# Patient Record
Sex: Male | Born: 1935
Health system: Southern US, Community
[De-identification: ages and names within clinical notes are randomized; demographics above are authoritative.]

## PROBLEM LIST (undated history)

## (undated) DIAGNOSIS — I491 Atrial premature depolarization: Secondary | ICD-10-CM

## (undated) DIAGNOSIS — H348322 Tributary (branch) retinal vein occlusion, left eye, stable: Secondary | ICD-10-CM

## (undated) DIAGNOSIS — Z974 Presence of external hearing-aid: Secondary | ICD-10-CM

## (undated) DIAGNOSIS — G4733 Obstructive sleep apnea (adult) (pediatric): Secondary | ICD-10-CM

## (undated) DIAGNOSIS — K219 Gastro-esophageal reflux disease without esophagitis: Secondary | ICD-10-CM

## (undated) DIAGNOSIS — T7840XA Allergy, unspecified, initial encounter: Secondary | ICD-10-CM

## (undated) DIAGNOSIS — E538 Deficiency of other specified B group vitamins: Secondary | ICD-10-CM

## (undated) DIAGNOSIS — K5909 Other constipation: Secondary | ICD-10-CM

## (undated) DIAGNOSIS — Z8719 Personal history of other diseases of the digestive system: Secondary | ICD-10-CM

## (undated) DIAGNOSIS — I6523 Occlusion and stenosis of bilateral carotid arteries: Secondary | ICD-10-CM

## (undated) DIAGNOSIS — I779 Disorder of arteries and arterioles, unspecified: Secondary | ICD-10-CM

## (undated) DIAGNOSIS — D126 Benign neoplasm of colon, unspecified: Secondary | ICD-10-CM

## (undated) DIAGNOSIS — R972 Elevated prostate specific antigen [PSA]: Secondary | ICD-10-CM

## (undated) DIAGNOSIS — R109 Unspecified abdominal pain: Secondary | ICD-10-CM

## (undated) DIAGNOSIS — I700 Atherosclerosis of aorta: Secondary | ICD-10-CM

## (undated) DIAGNOSIS — K2970 Gastritis, unspecified, without bleeding: Secondary | ICD-10-CM

## (undated) DIAGNOSIS — M1990 Unspecified osteoarthritis, unspecified site: Secondary | ICD-10-CM

## (undated) DIAGNOSIS — K222 Esophageal obstruction: Secondary | ICD-10-CM

## (undated) DIAGNOSIS — N400 Enlarged prostate without lower urinary tract symptoms: Secondary | ICD-10-CM

## (undated) DIAGNOSIS — I10 Essential (primary) hypertension: Secondary | ICD-10-CM

## (undated) DIAGNOSIS — F32A Depression, unspecified: Secondary | ICD-10-CM

## (undated) DIAGNOSIS — I498 Other specified cardiac arrhythmias: Secondary | ICD-10-CM

## (undated) DIAGNOSIS — R001 Bradycardia, unspecified: Secondary | ICD-10-CM

## (undated) DIAGNOSIS — K5790 Diverticulosis of intestine, part unspecified, without perforation or abscess without bleeding: Secondary | ICD-10-CM

## (undated) DIAGNOSIS — I739 Peripheral vascular disease, unspecified: Secondary | ICD-10-CM

## (undated) DIAGNOSIS — R002 Palpitations: Secondary | ICD-10-CM

## (undated) DIAGNOSIS — N200 Calculus of kidney: Secondary | ICD-10-CM

## (undated) DIAGNOSIS — K317 Polyp of stomach and duodenum: Secondary | ICD-10-CM

## (undated) DIAGNOSIS — K5730 Diverticulosis of large intestine without perforation or abscess without bleeding: Secondary | ICD-10-CM

## (undated) DIAGNOSIS — D693 Immune thrombocytopenic purpura: Secondary | ICD-10-CM

## (undated) DIAGNOSIS — R351 Nocturia: Secondary | ICD-10-CM

## (undated) DIAGNOSIS — K648 Other hemorrhoids: Secondary | ICD-10-CM

## (undated) DIAGNOSIS — I7300 Raynaud's syndrome without gangrene: Secondary | ICD-10-CM

## (undated) DIAGNOSIS — K449 Diaphragmatic hernia without obstruction or gangrene: Secondary | ICD-10-CM

## (undated) DIAGNOSIS — F329 Major depressive disorder, single episode, unspecified: Secondary | ICD-10-CM

## (undated) DIAGNOSIS — I493 Ventricular premature depolarization: Secondary | ICD-10-CM

## (undated) DIAGNOSIS — Z86010 Personal history of colonic polyps: Secondary | ICD-10-CM

## (undated) DIAGNOSIS — N401 Enlarged prostate with lower urinary tract symptoms: Secondary | ICD-10-CM

## (undated) DIAGNOSIS — F419 Anxiety disorder, unspecified: Secondary | ICD-10-CM

## (undated) DIAGNOSIS — E785 Hyperlipidemia, unspecified: Secondary | ICD-10-CM

## (undated) HISTORY — DX: Hyperlipidemia, unspecified: E78.5

## (undated) HISTORY — DX: Gastritis, unspecified, without bleeding: K29.70

## (undated) HISTORY — DX: Unspecified osteoarthritis, unspecified site: M19.90

## (undated) HISTORY — DX: Raynaud's syndrome without gangrene: I73.00

## (undated) HISTORY — DX: Essential (primary) hypertension: I10

## (undated) HISTORY — DX: Anxiety disorder, unspecified: F41.9

## (undated) HISTORY — DX: Immune thrombocytopenic purpura: D69.3

## (undated) HISTORY — DX: Esophageal obstruction: K22.2

## (undated) HISTORY — DX: Atherosclerosis of aorta: I70.0

## (undated) HISTORY — DX: Other hemorrhoids: K64.8

## (undated) HISTORY — DX: Atrial premature depolarization: I49.1

## (undated) HISTORY — PX: COLONOSCOPY: SHX174

## (undated) HISTORY — DX: Bradycardia, unspecified: R00.1

## (undated) HISTORY — PX: APPENDECTOMY: SHX54

## (undated) HISTORY — DX: Polyp of stomach and duodenum: K31.7

## (undated) HISTORY — DX: Diverticulosis of intestine, part unspecified, without perforation or abscess without bleeding: K57.90

## (undated) HISTORY — DX: Peripheral vascular disease, unspecified: I73.9

## (undated) HISTORY — DX: Unspecified abdominal pain: R10.9

## (undated) HISTORY — DX: Other specified cardiac arrhythmias: I49.8

## (undated) HISTORY — DX: Benign neoplasm of colon, unspecified: D12.6

## (undated) HISTORY — DX: Ventricular premature depolarization: I49.3

## (undated) HISTORY — DX: Calculus of kidney: N20.0

## (undated) HISTORY — DX: Palpitations: R00.2

## (undated) HISTORY — DX: Gastro-esophageal reflux disease without esophagitis: K21.9

## (undated) HISTORY — DX: Elevated prostate specific antigen (PSA): R97.20

## (undated) HISTORY — DX: Major depressive disorder, single episode, unspecified: F32.9

## (undated) HISTORY — DX: Allergy, unspecified, initial encounter: T78.40XA

## (undated) HISTORY — PX: TONSILLECTOMY: SUR1361

## (undated) HISTORY — DX: Personal history of colonic polyps: Z86.010

## (undated) HISTORY — DX: Personal history of other diseases of the digestive system: Z87.19

## (undated) HISTORY — DX: Depression, unspecified: F32.A

## (undated) HISTORY — DX: Disorder of arteries and arterioles, unspecified: I77.9

---
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
## 1985-05-20 HISTORY — PX: ROTATOR CUFF REPAIR: SHX139

## 1987-05-21 HISTORY — PX: CHOLECYSTECTOMY: SHX55

## 1987-05-21 HISTORY — PX: CARDIAC CATHETERIZATION: SHX172

## 1989-03-27 ENCOUNTER — Encounter (INDEPENDENT_AMBULATORY_CARE_PROVIDER_SITE_OTHER): Payer: Self-pay | Admitting: *Deleted

## 1992-04-25 ENCOUNTER — Encounter (INDEPENDENT_AMBULATORY_CARE_PROVIDER_SITE_OTHER): Payer: Self-pay | Admitting: *Deleted

## 1995-06-03 ENCOUNTER — Encounter (INDEPENDENT_AMBULATORY_CARE_PROVIDER_SITE_OTHER): Payer: Self-pay | Admitting: *Deleted

## 1997-03-02 ENCOUNTER — Encounter (INDEPENDENT_AMBULATORY_CARE_PROVIDER_SITE_OTHER): Payer: Self-pay | Admitting: *Deleted

## 1998-03-31 ENCOUNTER — Encounter: Admission: RE | Admit: 1998-03-31 | Discharge: 1998-03-31 | Payer: Self-pay | Admitting: Family Medicine

## 1998-04-19 ENCOUNTER — Encounter: Admission: RE | Admit: 1998-04-19 | Discharge: 1998-04-19 | Payer: Self-pay | Admitting: Family Medicine

## 1998-11-09 ENCOUNTER — Encounter (INDEPENDENT_AMBULATORY_CARE_PROVIDER_SITE_OTHER): Payer: Self-pay | Admitting: *Deleted

## 1998-11-09 ENCOUNTER — Encounter (INDEPENDENT_AMBULATORY_CARE_PROVIDER_SITE_OTHER): Payer: Self-pay | Admitting: Specialist

## 1998-11-09 ENCOUNTER — Other Ambulatory Visit: Admission: RE | Admit: 1998-11-09 | Discharge: 1998-11-09 | Payer: Self-pay | Admitting: Gastroenterology

## 1999-07-26 ENCOUNTER — Other Ambulatory Visit: Admission: RE | Admit: 1999-07-26 | Discharge: 1999-07-26 | Payer: Self-pay | Admitting: Urology

## 1999-08-22 ENCOUNTER — Ambulatory Visit (HOSPITAL_COMMUNITY): Admission: RE | Admit: 1999-08-22 | Discharge: 1999-08-22 | Payer: Self-pay | Admitting: Ophthalmology

## 2002-06-03 ENCOUNTER — Ambulatory Visit (HOSPITAL_COMMUNITY): Admission: RE | Admit: 2002-06-03 | Discharge: 2002-06-03 | Payer: Self-pay | Admitting: Internal Medicine

## 2003-08-17 ENCOUNTER — Encounter (INDEPENDENT_AMBULATORY_CARE_PROVIDER_SITE_OTHER): Payer: Self-pay | Admitting: *Deleted

## 2003-09-06 ENCOUNTER — Encounter (INDEPENDENT_AMBULATORY_CARE_PROVIDER_SITE_OTHER): Payer: Self-pay | Admitting: *Deleted

## 2004-03-29 ENCOUNTER — Ambulatory Visit: Payer: Self-pay | Admitting: Internal Medicine

## 2004-04-26 ENCOUNTER — Ambulatory Visit: Payer: Self-pay | Admitting: Internal Medicine

## 2004-05-20 HISTORY — PX: OTHER SURGICAL HISTORY: SHX169

## 2004-05-25 ENCOUNTER — Ambulatory Visit: Payer: Self-pay | Admitting: Internal Medicine

## 2004-05-31 ENCOUNTER — Ambulatory Visit: Payer: Self-pay | Admitting: Internal Medicine

## 2004-06-06 ENCOUNTER — Ambulatory Visit: Payer: Self-pay | Admitting: Internal Medicine

## 2004-06-07 ENCOUNTER — Ambulatory Visit: Payer: Self-pay

## 2004-06-22 ENCOUNTER — Ambulatory Visit: Payer: Self-pay | Admitting: Internal Medicine

## 2004-07-26 ENCOUNTER — Ambulatory Visit: Payer: Self-pay | Admitting: Internal Medicine

## 2004-08-24 ENCOUNTER — Ambulatory Visit: Payer: Self-pay | Admitting: Internal Medicine

## 2004-10-02 ENCOUNTER — Ambulatory Visit: Payer: Self-pay | Admitting: Internal Medicine

## 2004-10-26 ENCOUNTER — Ambulatory Visit: Payer: Self-pay | Admitting: Internal Medicine

## 2004-11-01 ENCOUNTER — Ambulatory Visit: Payer: Self-pay | Admitting: Internal Medicine

## 2004-11-26 ENCOUNTER — Ambulatory Visit: Payer: Self-pay | Admitting: Internal Medicine

## 2004-12-03 ENCOUNTER — Encounter (INDEPENDENT_AMBULATORY_CARE_PROVIDER_SITE_OTHER): Payer: Self-pay | Admitting: *Deleted

## 2004-12-03 ENCOUNTER — Ambulatory Visit: Payer: Self-pay | Admitting: Gastroenterology

## 2004-12-05 ENCOUNTER — Ambulatory Visit: Payer: Self-pay | Admitting: Internal Medicine

## 2004-12-11 ENCOUNTER — Ambulatory Visit: Payer: Self-pay | Admitting: Gastroenterology

## 2004-12-11 ENCOUNTER — Encounter (INDEPENDENT_AMBULATORY_CARE_PROVIDER_SITE_OTHER): Payer: Self-pay | Admitting: *Deleted

## 2004-12-25 ENCOUNTER — Ambulatory Visit (HOSPITAL_BASED_OUTPATIENT_CLINIC_OR_DEPARTMENT_OTHER): Admission: RE | Admit: 2004-12-25 | Discharge: 2004-12-25 | Payer: Self-pay | Admitting: Internal Medicine

## 2004-12-25 ENCOUNTER — Ambulatory Visit: Payer: Self-pay | Admitting: Internal Medicine

## 2005-01-02 ENCOUNTER — Ambulatory Visit: Payer: Self-pay | Admitting: Pulmonary Disease

## 2005-01-17 ENCOUNTER — Ambulatory Visit: Payer: Self-pay | Admitting: Internal Medicine

## 2005-01-18 ENCOUNTER — Ambulatory Visit: Payer: Self-pay | Admitting: Gastroenterology

## 2005-01-22 ENCOUNTER — Encounter (INDEPENDENT_AMBULATORY_CARE_PROVIDER_SITE_OTHER): Payer: Self-pay | Admitting: *Deleted

## 2005-01-23 ENCOUNTER — Encounter (INDEPENDENT_AMBULATORY_CARE_PROVIDER_SITE_OTHER): Payer: Self-pay | Admitting: *Deleted

## 2005-01-23 ENCOUNTER — Ambulatory Visit: Payer: Self-pay | Admitting: Gastroenterology

## 2005-01-24 ENCOUNTER — Ambulatory Visit: Payer: Self-pay | Admitting: Internal Medicine

## 2005-01-28 ENCOUNTER — Encounter (INDEPENDENT_AMBULATORY_CARE_PROVIDER_SITE_OTHER): Payer: Self-pay | Admitting: *Deleted

## 2005-01-28 ENCOUNTER — Ambulatory Visit: Payer: Self-pay | Admitting: Cardiology

## 2005-03-06 ENCOUNTER — Ambulatory Visit: Payer: Self-pay | Admitting: Internal Medicine

## 2005-03-06 ENCOUNTER — Encounter (INDEPENDENT_AMBULATORY_CARE_PROVIDER_SITE_OTHER): Payer: Self-pay | Admitting: *Deleted

## 2005-03-06 ENCOUNTER — Ambulatory Visit: Payer: Self-pay | Admitting: Gastroenterology

## 2005-05-27 ENCOUNTER — Ambulatory Visit: Payer: Self-pay | Admitting: Internal Medicine

## 2005-05-29 ENCOUNTER — Ambulatory Visit: Payer: Self-pay | Admitting: Internal Medicine

## 2005-06-07 ENCOUNTER — Ambulatory Visit: Payer: Self-pay

## 2005-07-22 ENCOUNTER — Ambulatory Visit: Payer: Self-pay | Admitting: Internal Medicine

## 2005-07-29 ENCOUNTER — Ambulatory Visit: Payer: Self-pay | Admitting: Internal Medicine

## 2005-10-21 ENCOUNTER — Ambulatory Visit: Payer: Self-pay | Admitting: Internal Medicine

## 2005-10-28 ENCOUNTER — Ambulatory Visit: Payer: Self-pay | Admitting: Internal Medicine

## 2005-11-07 ENCOUNTER — Ambulatory Visit: Payer: Self-pay | Admitting: Internal Medicine

## 2005-12-26 ENCOUNTER — Ambulatory Visit: Payer: Self-pay | Admitting: Internal Medicine

## 2006-01-27 ENCOUNTER — Ambulatory Visit: Payer: Self-pay | Admitting: Internal Medicine

## 2006-02-20 ENCOUNTER — Ambulatory Visit: Payer: Self-pay | Admitting: Internal Medicine

## 2006-04-21 ENCOUNTER — Ambulatory Visit: Payer: Self-pay | Admitting: Internal Medicine

## 2006-04-21 LAB — CONVERTED CEMR LAB
Albumin: 4.2 g/dL (ref 3.5–5.2)
Chol/HDL Ratio, serum: 3.8
Cholesterol: 165 mg/dL (ref 0–200)
Folate: >20 ng/mL
LDL Cholesterol: 89 mg/dL (ref 0–99)
Total Bilirubin: 0.9 mg/dL (ref 0.3–1.2)
Total Protein: 7 g/dL (ref 6.0–8.3)
Vitamin B-12: 353 pg/mL (ref 211–911)

## 2006-04-28 ENCOUNTER — Ambulatory Visit: Payer: Self-pay | Admitting: Internal Medicine

## 2006-05-22 ENCOUNTER — Ambulatory Visit: Payer: Self-pay

## 2006-06-02 ENCOUNTER — Ambulatory Visit: Payer: Self-pay | Admitting: Internal Medicine

## 2006-06-26 ENCOUNTER — Ambulatory Visit: Payer: Self-pay | Admitting: Internal Medicine

## 2006-07-24 ENCOUNTER — Ambulatory Visit: Payer: Self-pay | Admitting: Internal Medicine

## 2006-08-25 ENCOUNTER — Ambulatory Visit: Payer: Self-pay | Admitting: Internal Medicine

## 2006-08-25 LAB — CONVERTED CEMR LAB
Basophils Relative: 0.9 % (ref 0.0–1.0)
Folate: >20 ng/mL
Lymphocytes Relative: 36.5 % (ref 12.0–46.0)
Monocytes Relative: 9.1 % (ref 3.0–11.0)
Neutro Abs: 2.4 10*3/uL (ref 1.4–7.7)
Platelets: 157 10*3/uL (ref 150–400)
RDW: 12.8 % (ref 11.5–14.6)
Vitamin B-12: 364 pg/mL (ref 211–911)

## 2006-09-01 ENCOUNTER — Ambulatory Visit: Payer: Self-pay | Admitting: Internal Medicine

## 2006-10-01 ENCOUNTER — Ambulatory Visit: Payer: Self-pay | Admitting: Internal Medicine

## 2006-10-22 ENCOUNTER — Ambulatory Visit: Payer: Self-pay | Admitting: Gastroenterology

## 2006-11-03 ENCOUNTER — Ambulatory Visit: Payer: Self-pay | Admitting: Internal Medicine

## 2006-11-03 LAB — CONVERTED CEMR LAB
BUN: 12 mg/dL (ref 6–23)
Basophils Relative: 0.3 % (ref 0.0–1.0)
Bilirubin, Direct: 0.1 mg/dL (ref 0.0–0.3)
CO2: 31 meq/L (ref 19–32)
Eosinophils Relative: 3 % (ref 0.0–5.0)
GFR calc Af Amer: 94 mL/min
Glucose, Bld: 95 mg/dL (ref 70–99)
HDL: 41.8 mg/dL (ref >39.0)
Hemoglobin: 14.1 g/dL (ref 13.0–17.0)
Lymphocytes Relative: 33.6 % (ref 12.0–46.0)
Monocytes Absolute: 0.6 10*3/uL (ref 0.2–0.7)
Monocytes Relative: 10 % (ref 3.0–11.0)
Neutro Abs: 3.2 10*3/uL (ref 1.4–7.7)
Neutrophils Relative %: 53.1 % (ref 43.0–77.0)
PSA: 1.08 ng/mL (ref 0.10–4.00)
Potassium: 3.7 meq/L (ref 3.5–5.1)
Sodium: 141 meq/L (ref 135–145)
TSH: 2.66 microintl units/mL (ref 0.35–5.50)
Total Protein: 6.5 g/dL (ref 6.0–8.3)
VLDL: 23 mg/dL (ref 0–40)

## 2006-11-10 ENCOUNTER — Ambulatory Visit: Payer: Self-pay | Admitting: Internal Medicine

## 2007-01-02 ENCOUNTER — Telehealth: Payer: Self-pay | Admitting: *Deleted

## 2007-01-12 ENCOUNTER — Ambulatory Visit: Payer: Self-pay | Admitting: Internal Medicine

## 2007-01-22 DIAGNOSIS — E785 Hyperlipidemia, unspecified: Secondary | ICD-10-CM

## 2007-01-22 DIAGNOSIS — K219 Gastro-esophageal reflux disease without esophagitis: Secondary | ICD-10-CM | POA: Insufficient documentation

## 2007-01-26 ENCOUNTER — Ambulatory Visit: Payer: Self-pay | Admitting: Internal Medicine

## 2007-01-26 DIAGNOSIS — E538 Deficiency of other specified B group vitamins: Secondary | ICD-10-CM

## 2007-01-27 ENCOUNTER — Ambulatory Visit: Payer: Self-pay | Admitting: Internal Medicine

## 2007-01-27 ENCOUNTER — Telehealth: Payer: Self-pay | Admitting: Internal Medicine

## 2007-02-02 ENCOUNTER — Telehealth: Payer: Self-pay | Admitting: Internal Medicine

## 2007-04-20 ENCOUNTER — Ambulatory Visit: Payer: Self-pay | Admitting: Internal Medicine

## 2007-04-20 LAB — CONVERTED CEMR LAB
ALT: 18 units/L (ref 0–53)
Alkaline Phosphatase: 41 units/L (ref 39–117)
Bilirubin, Direct: 0.1 mg/dL (ref 0.0–0.3)
Direct LDL: 105.8 mg/dL
Total CHOL/HDL Ratio: 4.5
Triglycerides: 236 mg/dL (ref 0–149)
VLDL: 47 mg/dL — ABNORMAL HIGH (ref 0–40)

## 2007-04-29 ENCOUNTER — Ambulatory Visit: Payer: Self-pay | Admitting: Internal Medicine

## 2007-04-29 DIAGNOSIS — R071 Chest pain on breathing: Secondary | ICD-10-CM

## 2007-04-29 DIAGNOSIS — I10 Essential (primary) hypertension: Secondary | ICD-10-CM

## 2007-04-29 DIAGNOSIS — K4090 Unilateral inguinal hernia, without obstruction or gangrene, not specified as recurrent: Secondary | ICD-10-CM | POA: Insufficient documentation

## 2007-05-19 ENCOUNTER — Encounter: Payer: Self-pay | Admitting: Internal Medicine

## 2007-05-26 ENCOUNTER — Encounter: Payer: Self-pay | Admitting: Internal Medicine

## 2007-05-26 ENCOUNTER — Ambulatory Visit: Payer: Self-pay

## 2007-05-26 HISTORY — PX: OTHER SURGICAL HISTORY: SHX169

## 2007-06-22 ENCOUNTER — Encounter: Payer: Self-pay | Admitting: Endocrinology

## 2007-06-22 ENCOUNTER — Ambulatory Visit: Payer: Self-pay | Admitting: Internal Medicine

## 2007-08-05 ENCOUNTER — Ambulatory Visit: Payer: Self-pay | Admitting: Internal Medicine

## 2007-08-05 LAB — CONVERTED CEMR LAB: Vitamin B-12: 654 pg/mL (ref 211–911)

## 2007-08-14 ENCOUNTER — Ambulatory Visit: Payer: Self-pay | Admitting: Internal Medicine

## 2007-08-14 DIAGNOSIS — K648 Other hemorrhoids: Secondary | ICD-10-CM | POA: Insufficient documentation

## 2007-08-14 HISTORY — DX: Other hemorrhoids: K64.8

## 2007-09-29 ENCOUNTER — Encounter: Payer: Self-pay | Admitting: Internal Medicine

## 2007-10-30 ENCOUNTER — Telehealth: Payer: Self-pay | Admitting: Internal Medicine

## 2007-10-30 ENCOUNTER — Encounter: Payer: Self-pay | Admitting: Internal Medicine

## 2007-10-30 DIAGNOSIS — H547 Unspecified visual loss: Secondary | ICD-10-CM

## 2007-11-02 ENCOUNTER — Encounter: Payer: Self-pay | Admitting: Internal Medicine

## 2007-11-02 ENCOUNTER — Ambulatory Visit: Payer: Self-pay

## 2007-11-05 ENCOUNTER — Ambulatory Visit: Payer: Self-pay | Admitting: Internal Medicine

## 2007-11-05 DIAGNOSIS — I6529 Occlusion and stenosis of unspecified carotid artery: Secondary | ICD-10-CM | POA: Insufficient documentation

## 2007-11-06 ENCOUNTER — Ambulatory Visit: Payer: Self-pay | Admitting: Internal Medicine

## 2007-11-06 LAB — CONVERTED CEMR LAB
Albumin: 4 g/dL (ref 3.5–5.2)
Alkaline Phosphatase: 49 units/L (ref 39–117)
BUN: 13 mg/dL (ref 6–23)
Basophils Relative: 0.7 % (ref 0.0–1.0)
Blood in Urine, dipstick: NEGATIVE
Calcium: 9.7 mg/dL (ref 8.4–10.5)
Creatinine, Ser: 1.1 mg/dL (ref 0.4–1.5)
Eosinophils Absolute: 0.2 10*3/uL (ref 0.0–0.7)
Eosinophils Relative: 3.7 % (ref 0.0–5.0)
GFR calc Af Amer: 84 mL/min
GFR calc non Af Amer: 70 mL/min
Glucose, Bld: 95 mg/dL (ref 70–99)
Glucose, Urine, Semiquant: NEGATIVE
HCT: 40.2 % (ref 39.0–52.0)
HDL: 43.8 mg/dL (ref >39.0)
Hemoglobin: 13.9 g/dL (ref 13.0–17.0)
MCV: 89.5 fL (ref 78.0–100.0)
Monocytes Absolute: 0.5 10*3/uL (ref 0.1–1.0)
Monocytes Relative: 8.7 % (ref 3.0–12.0)
Neutro Abs: 2.9 10*3/uL (ref 1.4–7.7)
PSA: 1.26 ng/mL (ref 0.10–4.00)
Platelets: 131 10*3/uL — ABNORMAL LOW (ref 150–400)
Potassium: 4.6 meq/L (ref 3.5–5.1)
Specific Gravity, Urine: 1.02
TSH: 1.4 microintl units/mL (ref 0.35–5.50)
Total CHOL/HDL Ratio: 3.7
Total Protein: 6.7 g/dL (ref 6.0–8.3)
Urobilinogen, UA: 0.2
Vitamin B-12: 521 pg/mL (ref 211–911)
WBC Urine, dipstick: NEGATIVE
WBC Urine, dipstick: NEGATIVE
WBC: 5.2 10*3/uL (ref 4.5–10.5)
pH: 6

## 2007-11-09 ENCOUNTER — Telehealth: Payer: Self-pay | Admitting: *Deleted

## 2007-11-13 ENCOUNTER — Ambulatory Visit: Payer: Self-pay | Admitting: Internal Medicine

## 2007-11-25 ENCOUNTER — Ambulatory Visit: Payer: Self-pay | Admitting: Internal Medicine

## 2007-11-25 LAB — CONVERTED CEMR LAB
Eosinophils Absolute: 0.2 10*3/uL (ref 0.0–0.7)
Eosinophils Relative: 3 % (ref 0.0–5.0)
HCT: 40.9 % (ref 39.0–52.0)
Monocytes Absolute: 0.5 10*3/uL (ref 0.1–1.0)
Monocytes Relative: 9.2 % (ref 3.0–12.0)
Neutrophils Relative %: 58.2 % (ref 43.0–77.0)
Platelets: 121 10*3/uL — ABNORMAL LOW (ref 150–400)
RDW: 12.2 % (ref 11.5–14.6)
WBC: 5.2 10*3/uL (ref 4.5–10.5)

## 2007-11-26 ENCOUNTER — Ambulatory Visit: Payer: Self-pay | Admitting: *Deleted

## 2007-11-27 ENCOUNTER — Telehealth: Payer: Self-pay | Admitting: Internal Medicine

## 2007-12-01 ENCOUNTER — Encounter: Admission: RE | Admit: 2007-12-01 | Discharge: 2007-12-01 | Payer: Self-pay | Admitting: *Deleted

## 2007-12-10 ENCOUNTER — Ambulatory Visit: Payer: Self-pay | Admitting: *Deleted

## 2007-12-11 ENCOUNTER — Telehealth: Payer: Self-pay | Admitting: Internal Medicine

## 2007-12-18 ENCOUNTER — Encounter: Payer: Self-pay | Admitting: Internal Medicine

## 2007-12-23 ENCOUNTER — Telehealth: Payer: Self-pay | Admitting: *Deleted

## 2007-12-24 DIAGNOSIS — H3400 Transient retinal artery occlusion, unspecified eye: Secondary | ICD-10-CM

## 2007-12-28 ENCOUNTER — Telehealth: Payer: Self-pay | Admitting: Internal Medicine

## 2007-12-30 ENCOUNTER — Telehealth: Payer: Self-pay | Admitting: Internal Medicine

## 2007-12-31 ENCOUNTER — Ambulatory Visit: Payer: Self-pay | Admitting: Internal Medicine

## 2008-01-08 LAB — CONVERTED CEMR LAB
Basophils Absolute: 0 10*3/uL (ref 0.0–0.1)
Bilirubin, Direct: 0.1 mg/dL (ref 0.0–0.3)
Cholesterol: 152 mg/dL (ref 0–200)
HCT: 41.7 % (ref 39.0–52.0)
HDL: 44.7 mg/dL (ref >39.0)
Hemoglobin: 14.6 g/dL (ref 13.0–17.0)
LDL Cholesterol: 80 mg/dL (ref 0–99)
MCHC: 35 g/dL (ref 30.0–36.0)
Monocytes Absolute: 0.5 10*3/uL (ref 0.1–1.0)
Neutro Abs: 2.5 10*3/uL (ref 1.4–7.7)
Platelets: 123 10*3/uL — ABNORMAL LOW (ref 150–400)
RDW: 12.5 % (ref 11.5–14.6)
Total Bilirubin: 0.9 mg/dL (ref 0.3–1.2)
Triglycerides: 138 mg/dL (ref 0–149)

## 2008-01-11 ENCOUNTER — Encounter: Payer: Self-pay | Admitting: Internal Medicine

## 2008-01-12 ENCOUNTER — Telehealth: Payer: Self-pay | Admitting: Internal Medicine

## 2008-01-19 ENCOUNTER — Ambulatory Visit: Payer: Self-pay | Admitting: Internal Medicine

## 2008-02-16 ENCOUNTER — Emergency Department (HOSPITAL_COMMUNITY): Admission: EM | Admit: 2008-02-16 | Discharge: 2008-02-16 | Payer: Self-pay | Admitting: Emergency Medicine

## 2008-02-23 ENCOUNTER — Telehealth: Payer: Self-pay | Admitting: Internal Medicine

## 2008-03-04 DIAGNOSIS — K222 Esophageal obstruction: Secondary | ICD-10-CM | POA: Insufficient documentation

## 2008-03-04 DIAGNOSIS — Z860100 Personal history of colon polyps, unspecified: Secondary | ICD-10-CM | POA: Insufficient documentation

## 2008-03-04 DIAGNOSIS — F341 Dysthymic disorder: Secondary | ICD-10-CM

## 2008-03-07 ENCOUNTER — Ambulatory Visit: Payer: Self-pay | Admitting: Internal Medicine

## 2008-03-07 DIAGNOSIS — K5900 Constipation, unspecified: Secondary | ICD-10-CM | POA: Insufficient documentation

## 2008-04-11 ENCOUNTER — Ambulatory Visit: Payer: Self-pay | Admitting: Internal Medicine

## 2008-04-11 LAB — CONVERTED CEMR LAB
Basophils Relative: 0.8 % (ref 0.0–3.0)
Eosinophils Absolute: 0.2 10*3/uL (ref 0.0–0.7)
Eosinophils Relative: 4.1 % (ref 0.0–5.0)
HCT: 43.3 % (ref 39.0–52.0)
Hemoglobin: 15.1 g/dL (ref 13.0–17.0)
MCHC: 34.8 g/dL (ref 30.0–36.0)
MCV: 90.6 fL (ref 78.0–100.0)
Monocytes Absolute: 0.4 10*3/uL (ref 0.1–1.0)
Neutro Abs: 3.2 10*3/uL (ref 1.4–7.7)
RBC: 4.78 M/uL (ref 4.22–5.81)
WBC: 5.3 10*3/uL (ref 4.5–10.5)

## 2008-04-21 ENCOUNTER — Ambulatory Visit: Payer: Self-pay | Admitting: Internal Medicine

## 2008-04-21 DIAGNOSIS — D696 Thrombocytopenia, unspecified: Secondary | ICD-10-CM

## 2008-04-22 ENCOUNTER — Ambulatory Visit: Payer: Self-pay | Admitting: Oncology

## 2008-04-22 ENCOUNTER — Telehealth: Payer: Self-pay | Admitting: Internal Medicine

## 2008-04-25 ENCOUNTER — Telehealth: Payer: Self-pay | Admitting: Internal Medicine

## 2008-04-28 ENCOUNTER — Encounter: Payer: Self-pay | Admitting: Internal Medicine

## 2008-04-28 LAB — CBC WITH DIFFERENTIAL/PLATELET
Eosinophils Absolute: 0.1 10*3/uL (ref 0.0–0.5)
HCT: 40.1 % (ref 38.7–49.9)
LYMPH%: 29.1 % (ref 14.0–48.0)
MONO#: 0.5 10*3/uL (ref 0.1–0.9)
NEUT#: 3.5 10*3/uL (ref 1.5–6.5)
NEUT%: 60.3 % (ref 40.0–75.0)
Platelets: 122 10*3/uL — ABNORMAL LOW (ref 145–400)
WBC: 5.8 10*3/uL (ref 4.0–10.0)

## 2008-04-28 LAB — CHCC SMEAR

## 2008-04-29 LAB — COMPREHENSIVE METABOLIC PANEL
AST: 20 U/L (ref 0–37)
Albumin: 4.5 g/dL (ref 3.5–5.2)
BUN: 22 mg/dL (ref 6–23)
Calcium: 9.7 mg/dL (ref 8.4–10.5)
Chloride: 104 mEq/L (ref 96–112)
Glucose, Bld: 102 mg/dL — ABNORMAL HIGH (ref 70–99)
Potassium: 4.4 mEq/L (ref 3.5–5.3)
Total Protein: 7 g/dL (ref 6.0–8.3)

## 2008-04-29 LAB — RHEUMATOID FACTOR: Rhuematoid fact SerPl-aCnc: <20 IU/mL (ref 0–20)

## 2008-05-03 ENCOUNTER — Ambulatory Visit: Payer: Self-pay | Admitting: Internal Medicine

## 2008-05-20 DIAGNOSIS — K222 Esophageal obstruction: Secondary | ICD-10-CM

## 2008-05-20 DIAGNOSIS — Z860101 Personal history of adenomatous and serrated colon polyps: Secondary | ICD-10-CM

## 2008-05-20 DIAGNOSIS — Z86010 Personal history of colonic polyps: Secondary | ICD-10-CM

## 2008-05-20 HISTORY — DX: Personal history of colonic polyps: Z86.010

## 2008-05-20 HISTORY — DX: Personal history of adenomatous and serrated colon polyps: Z86.0101

## 2008-05-20 HISTORY — DX: Esophageal obstruction: K22.2

## 2008-05-25 ENCOUNTER — Telehealth: Payer: Self-pay | Admitting: Internal Medicine

## 2008-05-26 ENCOUNTER — Ambulatory Visit: Payer: Self-pay | Admitting: Internal Medicine

## 2008-05-26 LAB — CONVERTED CEMR LAB
Basophils Absolute: 0.1 10*3/uL (ref 0.0–0.1)
Hemoglobin: 14.4 g/dL (ref 13.0–17.0)
Lymphocytes Relative: 29.1 % (ref 12.0–46.0)
MCHC: 34.4 g/dL (ref 30.0–36.0)
Neutro Abs: 3.5 10*3/uL (ref 1.4–7.7)
Neutrophils Relative %: 59.6 % (ref 43.0–77.0)
Platelets: 119 10*3/uL — ABNORMAL LOW (ref 150–400)
RDW: 12.7 % (ref 11.5–14.6)

## 2008-06-10 ENCOUNTER — Telehealth: Payer: Self-pay | Admitting: Internal Medicine

## 2008-06-24 ENCOUNTER — Ambulatory Visit: Payer: Self-pay | Admitting: Internal Medicine

## 2008-06-24 LAB — CONVERTED CEMR LAB
Eosinophils Relative: 3.2 % (ref 0.0–5.0)
HCT: 42.2 % (ref 39.0–52.0)
Monocytes Absolute: 0.6 10*3/uL (ref 0.1–1.0)
Monocytes Relative: 11.1 % (ref 3.0–12.0)
Neutrophils Relative %: 52.8 % (ref 43.0–77.0)
Platelets: 121 10*3/uL — ABNORMAL LOW (ref 150–400)
RBC: 4.68 M/uL (ref 4.22–5.81)
WBC: 5.4 10*3/uL (ref 4.5–10.5)

## 2008-07-21 ENCOUNTER — Ambulatory Visit: Payer: Self-pay | Admitting: Internal Medicine

## 2008-07-21 LAB — CONVERTED CEMR LAB
ALT: 24 units/L (ref 0–53)
AST: 25 units/L (ref 0–37)
Bilirubin, Direct: 0.2 mg/dL (ref 0.0–0.3)
HDL: 46.6 mg/dL (ref >39.0)
LDL Cholesterol: 87 mg/dL (ref 0–99)
Lymphocytes Relative: 32.3 % (ref 12.0–46.0)
Monocytes Relative: 9.2 % (ref 3.0–12.0)
Platelets: 106 10*3/uL — ABNORMAL LOW (ref 150–400)
RDW: 12.5 % (ref 11.5–14.6)
Total Bilirubin: 1.1 mg/dL (ref 0.3–1.2)
Total CHOL/HDL Ratio: 3.4
Triglycerides: 124 mg/dL (ref 0–149)

## 2008-07-22 ENCOUNTER — Encounter (INDEPENDENT_AMBULATORY_CARE_PROVIDER_SITE_OTHER): Payer: Self-pay | Admitting: *Deleted

## 2008-07-27 ENCOUNTER — Encounter: Admission: RE | Admit: 2008-07-27 | Discharge: 2008-07-27 | Payer: Self-pay | Admitting: Orthopedic Surgery

## 2008-07-28 ENCOUNTER — Ambulatory Visit: Payer: Self-pay | Admitting: Internal Medicine

## 2008-07-28 DIAGNOSIS — K5909 Other constipation: Secondary | ICD-10-CM

## 2008-08-11 ENCOUNTER — Telehealth: Payer: Self-pay | Admitting: Internal Medicine

## 2008-08-15 ENCOUNTER — Ambulatory Visit: Payer: Self-pay | Admitting: Internal Medicine

## 2008-08-15 LAB — CONVERTED CEMR LAB
Basophils Relative: 0.7 % (ref 0.0–3.0)
Eosinophils Absolute: 0.2 10*3/uL (ref 0.0–0.7)
MCHC: 33.9 g/dL (ref 30.0–36.0)
MCV: 90.8 fL (ref 78.0–100.0)
Monocytes Absolute: 0.6 10*3/uL (ref 0.1–1.0)
Neutrophils Relative %: 40.7 % — ABNORMAL LOW (ref 43.0–77.0)
Platelets: 93 10*3/uL — ABNORMAL LOW (ref 150.0–400.0)

## 2008-08-22 ENCOUNTER — Ambulatory Visit: Payer: Self-pay | Admitting: Internal Medicine

## 2008-08-22 LAB — CONVERTED CEMR LAB
Folate: >20 ng/mL
Vitamin B-12: 691 pg/mL (ref 211–911)

## 2008-08-23 ENCOUNTER — Ambulatory Visit: Payer: Self-pay | Admitting: Internal Medicine

## 2008-08-24 ENCOUNTER — Ambulatory Visit: Payer: Self-pay | Admitting: Oncology

## 2008-08-26 ENCOUNTER — Encounter: Payer: Self-pay | Admitting: Internal Medicine

## 2008-08-26 ENCOUNTER — Telehealth (INDEPENDENT_AMBULATORY_CARE_PROVIDER_SITE_OTHER): Payer: Self-pay | Admitting: *Deleted

## 2008-08-26 LAB — CBC WITH DIFFERENTIAL/PLATELET
Basophils Absolute: 0 10*3/uL (ref 0.0–0.1)
EOS%: 1.8 % (ref 0.0–7.0)
HCT: 40.8 % (ref 38.4–49.9)
HGB: 14 g/dL (ref 13.0–17.1)
LYMPH%: 22.5 % (ref 14.0–49.0)
MCH: 30.5 pg (ref 27.2–33.4)
MCV: 88.7 fL (ref 79.3–98.0)
MONO%: 5.3 % (ref 0.0–14.0)
NEUT%: 69.7 % (ref 39.0–75.0)

## 2008-09-08 ENCOUNTER — Ambulatory Visit: Payer: Self-pay | Admitting: Internal Medicine

## 2008-09-08 ENCOUNTER — Encounter: Payer: Self-pay | Admitting: Internal Medicine

## 2008-09-08 LAB — HM COLONOSCOPY

## 2008-09-09 ENCOUNTER — Encounter: Payer: Self-pay | Admitting: Internal Medicine

## 2008-09-20 ENCOUNTER — Encounter: Payer: Self-pay | Admitting: Internal Medicine

## 2008-12-01 ENCOUNTER — Ambulatory Visit: Payer: Self-pay | Admitting: Internal Medicine

## 2008-12-01 LAB — CONVERTED CEMR LAB
BUN: 17 mg/dL (ref 6–23)
Basophils Relative: 0.3 % (ref 0.0–3.0)
Bilirubin, Direct: 0.1 mg/dL (ref 0.0–0.3)
CO2: 30 meq/L (ref 19–32)
Chloride: 104 meq/L (ref 96–112)
Cholesterol: 127 mg/dL (ref 0–200)
Creatinine, Ser: 1.2 mg/dL (ref 0.4–1.5)
Eosinophils Relative: 3.8 % (ref 0.0–5.0)
Glucose, Bld: 100 mg/dL — ABNORMAL HIGH (ref 70–99)
HDL: 44.7 mg/dL (ref >39.00)
Hemoglobin: 14.5 g/dL (ref 13.0–17.0)
LDL Cholesterol: 63 mg/dL (ref 0–99)
Lymphocytes Relative: 32.5 % (ref 12.0–46.0)
MCHC: 34.8 g/dL (ref 30.0–36.0)
Monocytes Relative: 9.6 % (ref 3.0–12.0)
Neutro Abs: 2.9 10*3/uL (ref 1.4–7.7)
Potassium: 4.2 meq/L (ref 3.5–5.1)
RBC: 4.62 M/uL (ref 4.22–5.81)
Total Protein: 6.5 g/dL (ref 6.0–8.3)
VLDL: 19.8 mg/dL (ref 0.0–40.0)
WBC: 5.4 10*3/uL (ref 4.5–10.5)

## 2008-12-08 ENCOUNTER — Ambulatory Visit: Payer: Self-pay | Admitting: Internal Medicine

## 2008-12-30 ENCOUNTER — Telehealth (INDEPENDENT_AMBULATORY_CARE_PROVIDER_SITE_OTHER): Payer: Self-pay | Admitting: *Deleted

## 2009-02-22 ENCOUNTER — Ambulatory Visit: Payer: Self-pay | Admitting: Oncology

## 2009-02-24 ENCOUNTER — Encounter: Payer: Self-pay | Admitting: Internal Medicine

## 2009-02-24 ENCOUNTER — Encounter (INDEPENDENT_AMBULATORY_CARE_PROVIDER_SITE_OTHER): Payer: Self-pay | Admitting: *Deleted

## 2009-02-24 LAB — CBC WITH DIFFERENTIAL/PLATELET
Basophils Absolute: 0 10*3/uL (ref 0.0–0.1)
EOS%: 0.9 % (ref 0.0–7.0)
HCT: 41.5 % (ref 38.4–49.9)
HGB: 14.5 g/dL (ref 13.0–17.1)
MCH: 31.1 pg (ref 27.2–33.4)
NEUT%: 71.4 % (ref 39.0–75.0)
lymph#: 1.6 10*3/uL (ref 0.9–3.3)

## 2009-03-01 ENCOUNTER — Ambulatory Visit: Payer: Self-pay | Admitting: Internal Medicine

## 2009-03-01 DIAGNOSIS — N419 Inflammatory disease of prostate, unspecified: Secondary | ICD-10-CM | POA: Insufficient documentation

## 2009-03-06 LAB — CONVERTED CEMR LAB
AST: 28 units/L (ref 0–37)
Albumin: 4.5 g/dL (ref 3.5–5.2)
BUN: 19 mg/dL (ref 6–23)
Basophils Relative: 0.6 % (ref 0.0–3.0)
CO2: 31 meq/L (ref 19–32)
Chloride: 99 meq/L (ref 96–112)
Cholesterol: 154 mg/dL (ref 0–200)
Creatinine, Ser: 1 mg/dL (ref 0.4–1.5)
Eosinophils Absolute: 0.1 10*3/uL (ref 0.0–0.7)
Eosinophils Relative: 1.4 % (ref 0.0–5.0)
Folate: >20 ng/mL
Hemoglobin: 15 g/dL (ref 13.0–17.0)
Iron: 80 ug/dL (ref 42–165)
MCHC: 34.1 g/dL (ref 30.0–36.0)
MCV: 92.1 fL (ref 78.0–100.0)
Monocytes Absolute: 0.4 10*3/uL (ref 0.1–1.0)
Neutro Abs: 3.9 10*3/uL (ref 1.4–7.7)
Potassium: 4 meq/L (ref 3.5–5.1)
RBC: 4.78 M/uL (ref 4.22–5.81)
TSH: 1.03 microintl units/mL (ref 0.35–5.50)
Total Bilirubin: 1.1 mg/dL (ref 0.3–1.2)
Transferrin: 219.4 mg/dL (ref 212.0–360.0)
Vitamin B-12: 1301 pg/mL — ABNORMAL HIGH (ref 211–911)
WBC: 6 10*3/uL (ref 4.5–10.5)

## 2009-03-09 ENCOUNTER — Telehealth: Payer: Self-pay | Admitting: Internal Medicine

## 2009-03-10 ENCOUNTER — Telehealth: Payer: Self-pay | Admitting: Internal Medicine

## 2009-05-25 ENCOUNTER — Ambulatory Visit: Payer: Self-pay | Admitting: Internal Medicine

## 2009-05-25 LAB — CONVERTED CEMR LAB
Basophils Relative: 0.8 % (ref 0.0–3.0)
Eosinophils Relative: 1.8 % (ref 0.0–5.0)
Hemoglobin: 14.6 g/dL (ref 13.0–17.0)
Lymphocytes Relative: 20.4 % (ref 12.0–46.0)
MCHC: 33.7 g/dL (ref 30.0–36.0)
Monocytes Relative: 8 % (ref 3.0–12.0)
Neutro Abs: 4.3 10*3/uL (ref 1.4–7.7)
Neutrophils Relative %: 69 % (ref 43.0–77.0)
RBC: 4.64 M/uL (ref 4.22–5.81)
WBC: 6.1 10*3/uL (ref 4.5–10.5)

## 2009-06-02 ENCOUNTER — Ambulatory Visit: Payer: Self-pay | Admitting: Internal Medicine

## 2009-06-02 DIAGNOSIS — I7300 Raynaud's syndrome without gangrene: Secondary | ICD-10-CM | POA: Insufficient documentation

## 2009-07-06 ENCOUNTER — Ambulatory Visit: Payer: Self-pay | Admitting: Internal Medicine

## 2009-07-06 DIAGNOSIS — M8580 Other specified disorders of bone density and structure, unspecified site: Secondary | ICD-10-CM | POA: Insufficient documentation

## 2009-07-12 ENCOUNTER — Encounter: Payer: Self-pay | Admitting: Internal Medicine

## 2009-07-12 ENCOUNTER — Ambulatory Visit: Payer: Self-pay | Admitting: Internal Medicine

## 2009-08-24 ENCOUNTER — Ambulatory Visit: Payer: Self-pay | Admitting: Oncology

## 2009-08-28 ENCOUNTER — Encounter: Payer: Self-pay | Admitting: Internal Medicine

## 2009-08-28 LAB — CBC WITH DIFFERENTIAL/PLATELET
BASO%: 0.8 % (ref 0.0–2.0)
LYMPH%: 25.9 % (ref 14.0–49.0)
MCHC: 35.5 g/dL (ref 32.0–36.0)
MONO#: 0.4 10*3/uL (ref 0.1–0.9)
Platelets: 119 10*3/uL — ABNORMAL LOW (ref 140–400)
RBC: 5.02 10*6/uL (ref 4.20–5.82)
WBC: 7.6 10*3/uL (ref 4.0–10.3)

## 2009-09-07 ENCOUNTER — Ambulatory Visit: Payer: Self-pay | Admitting: Internal Medicine

## 2009-09-13 LAB — CONVERTED CEMR LAB
Albumin: 4.1 g/dL (ref 3.5–5.2)
Alkaline Phosphatase: 43 units/L (ref 39–117)
Basophils Absolute: 0 10*3/uL (ref 0.0–0.1)
Basophils Relative: 0.8 % (ref 0.0–3.0)
Eosinophils Absolute: 0.3 10*3/uL (ref 0.0–0.7)
HCT: 42.7 % (ref 39.0–52.0)
Hemoglobin: 14.6 g/dL (ref 13.0–17.0)
Lymphocytes Relative: 37.8 % (ref 12.0–46.0)
Lymphs Abs: 2.3 10*3/uL (ref 0.7–4.0)
MCHC: 34.1 g/dL (ref 30.0–36.0)
MCV: 91.6 fL (ref 78.0–100.0)
Neutro Abs: 2.8 10*3/uL (ref 1.4–7.7)
RBC: 4.66 M/uL (ref 4.22–5.81)
RDW: 13.2 % (ref 11.5–14.6)
Total Protein: 6.6 g/dL (ref 6.0–8.3)

## 2009-09-14 ENCOUNTER — Ambulatory Visit: Payer: Self-pay | Admitting: Internal Medicine

## 2009-10-18 ENCOUNTER — Ambulatory Visit: Payer: Self-pay | Admitting: Internal Medicine

## 2009-10-18 DIAGNOSIS — R1013 Epigastric pain: Secondary | ICD-10-CM | POA: Insufficient documentation

## 2009-10-24 ENCOUNTER — Encounter: Payer: Self-pay | Admitting: Internal Medicine

## 2010-02-23 ENCOUNTER — Ambulatory Visit: Payer: Self-pay | Admitting: Oncology

## 2010-02-27 ENCOUNTER — Encounter: Payer: Self-pay | Admitting: Internal Medicine

## 2010-02-27 LAB — COMPREHENSIVE METABOLIC PANEL
AST: 19 U/L (ref 0–37)
Albumin: 4.2 g/dL (ref 3.5–5.2)
Alkaline Phosphatase: 49 U/L (ref 39–117)
Glucose, Bld: 90 mg/dL (ref 70–99)
Potassium: 4.2 mEq/L (ref 3.5–5.3)
Sodium: 143 mEq/L (ref 135–145)
Total Bilirubin: 1 mg/dL (ref 0.3–1.2)
Total Protein: 6.8 g/dL (ref 6.0–8.3)

## 2010-02-27 LAB — CBC WITH DIFFERENTIAL/PLATELET
BASO%: 0.5 % (ref 0.0–2.0)
EOS%: 2.5 % (ref 0.0–7.0)
LYMPH%: 32.9 % (ref 14.0–49.0)
MCH: 31.4 pg (ref 27.2–33.4)
MCHC: 34.6 g/dL (ref 32.0–36.0)
MCV: 90.8 fL (ref 79.3–98.0)
MONO%: 8.7 % (ref 0.0–14.0)
NEUT#: 2.9 10*3/uL (ref 1.5–6.5)
Platelets: 135 10*3/uL — ABNORMAL LOW (ref 140–400)
RBC: 4.52 10*6/uL (ref 4.20–5.82)
RDW: 13.5 % (ref 11.0–14.6)

## 2010-03-06 ENCOUNTER — Ambulatory Visit: Payer: Self-pay | Admitting: Internal Medicine

## 2010-03-06 ENCOUNTER — Telehealth: Payer: Self-pay | Admitting: Internal Medicine

## 2010-03-06 LAB — CONVERTED CEMR LAB
Albumin: 4.2 g/dL (ref 3.5–5.2)
Alkaline Phosphatase: 49 units/L (ref 39–117)
CO2: 32 meq/L (ref 19–32)
Chloride: 106 meq/L (ref 96–112)
Cholesterol: 130 mg/dL (ref 0–200)
Creatinine, Ser: 1.1 mg/dL (ref 0.4–1.5)
Glucose, Bld: 87 mg/dL (ref 70–99)
LDL Cholesterol: 57 mg/dL (ref 0–99)
Saturation Ratios: 25.8 % (ref 20.0–50.0)
Sodium: 142 meq/L (ref 135–145)
Total Bilirubin: 0.9 mg/dL (ref 0.3–1.2)
Total CHOL/HDL Ratio: 3
Triglycerides: 143 mg/dL (ref 0.0–149.0)
VLDL: 28.6 mg/dL (ref 0.0–40.0)

## 2010-03-07 ENCOUNTER — Telehealth: Payer: Self-pay | Admitting: Internal Medicine

## 2010-03-12 ENCOUNTER — Telehealth: Payer: Self-pay | Admitting: Internal Medicine

## 2010-03-16 ENCOUNTER — Ambulatory Visit: Payer: Self-pay | Admitting: Internal Medicine

## 2010-03-16 DIAGNOSIS — M1990 Unspecified osteoarthritis, unspecified site: Secondary | ICD-10-CM

## 2010-03-16 DIAGNOSIS — R109 Unspecified abdominal pain: Secondary | ICD-10-CM

## 2010-03-16 DIAGNOSIS — K5730 Diverticulosis of large intestine without perforation or abscess without bleeding: Secondary | ICD-10-CM | POA: Insufficient documentation

## 2010-03-16 LAB — CONVERTED CEMR LAB
Basophils Absolute: 0 10*3/uL (ref 0.0–0.1)
Eosinophils Absolute: 0.2 10*3/uL (ref 0.0–0.7)
MCHC: 34.7 g/dL (ref 30.0–36.0)
MCV: 92.2 fL (ref 78.0–100.0)
Monocytes Absolute: 0.5 10*3/uL (ref 0.1–1.0)
Neutrophils Relative %: 58.4 % (ref 43.0–77.0)
Platelets: 138 10*3/uL — ABNORMAL LOW (ref 150.0–400.0)

## 2010-03-24 ENCOUNTER — Encounter: Payer: Self-pay | Admitting: Internal Medicine

## 2010-04-21 ENCOUNTER — Ambulatory Visit: Payer: Self-pay | Admitting: Internal Medicine

## 2010-04-23 ENCOUNTER — Ambulatory Visit: Payer: Self-pay | Admitting: Gastroenterology

## 2010-04-23 ENCOUNTER — Telehealth: Payer: Self-pay | Admitting: Physician Assistant

## 2010-04-23 ENCOUNTER — Encounter: Payer: Self-pay | Admitting: Internal Medicine

## 2010-04-24 ENCOUNTER — Ambulatory Visit: Payer: Self-pay | Admitting: Cardiovascular Disease

## 2010-04-25 ENCOUNTER — Ambulatory Visit: Payer: Self-pay | Admitting: Internal Medicine

## 2010-04-25 ENCOUNTER — Ambulatory Visit (HOSPITAL_COMMUNITY)
Admission: RE | Admit: 2010-04-25 | Discharge: 2010-04-25 | Payer: Self-pay | Source: Home / Self Care | Admitting: Internal Medicine

## 2010-05-10 ENCOUNTER — Encounter: Payer: Self-pay | Admitting: Internal Medicine

## 2010-05-28 ENCOUNTER — Telehealth: Payer: Self-pay | Admitting: Internal Medicine

## 2010-06-19 NOTE — Procedures (Signed)
Summary: [facility]   Imported By: Sherian Rein 03/19/2010 14:16:57  _____________________________________________________________________  External Attachment:    Type:   Image     Comment:   External Document

## 2010-06-19 NOTE — Assessment & Plan Note (Signed)
Summary: 3 month rov/njr   Vital Signs:  Patient profile:   77 year old male Height:      68 inches Weight:      152 pounds BMI:     23.20 Temp:     98.2 degrees F oral Pulse rate:   68 / minute Resp:     14 per minute BP sitting:   144 / 60  (left arm)  Vitals Entered By: Willy Eddy, LPN (June 02, 2009 1:49 PM) CC: roa labs-discuss elevated bp, Hypertension Management   Primary Care Provider:  Darryll Capers, MD  CC:  roa labs-discuss elevated bp and Hypertension Management.  History of Present Illness: blood pressure readings have crept yp with top numbers reading greater that 150 the pt has been taking meloxicam which he feels may be causal? mild raynauds worsing with age   Hypertension History:      He denies headache, chest pain, palpitations, dyspnea with exertion, orthopnea, PND, peripheral edema, visual symptoms, neurologic problems, syncope, and side effects from treatment.        Positive major cardiovascular risk factors include male age 92 years old or older, hyperlipidemia, and hypertension.  Negative major cardiovascular risk factors include no history of diabetes and non-tobacco-user status.        Further assessment for target organ damage reveals no history of ASHD, stroke/TIA, or peripheral vascular disease.     Preventive Screening-Counseling & Management  Alcohol-Tobacco     Smoking Status: never  Problems Prior to Update: 1)  Prostatitis, Recurrent  (ICD-601.9) 2)  Constipation, Chronic  (ICD-564.09) 3)  Thrombocythemia  (ICD-238.71) 4)  Constipation  (ICD-564.00) 5)  Esophageal Stricture  (ICD-530.3) 6)  Colonic Polyps, Hx of  (ICD-V12.72) 7)  Anxiety Depression  (ICD-300.4) 8)  Amaurosis Fugax  (ICD-362.34) 9)  Physical Examination  (ICD-V70.0) 10)  Carotid Artery Stenosis, Bilateral  (ICD-433.10) 11)  Unspecified Visual Loss  (ICD-369.9) 12)  Internal Hemorrhoids Without Mention Comp  (ICD-455.0) 13)  Chest Wall Pain, Anterior   (ICD-786.52) 14)  Inguinal Hernia, Right  (ICD-550.90) 15)  Hypertension  (ICD-401.9) 16)  Vitamin B12 Deficiency  (ICD-266.2) 17)  Hyperlipidemia  (ICD-272.4) 18)  Gerd  (ICD-530.81)  Medications Prior to Update: 1)  Nascobal 500 Mcg/0.43ml Nasal Soln (Cyanocobalamin) .... in One Nostril Evert 2 Weeks 2)  Calcium 500 and Vitamim D 300 .[name].. 3 Once Daily 3)  Aciphex 20 Mg  Tbec (Rabeprazole Sodium) .... Take 1 Capsule By Mouth Once A Day 4)  Proscar 5 Mg  Tabs (Finasteride) .... Take 1 Tablet By Mouth Once A Day 5)  Zinc   Tabs (Zinc Tabs) .... Take 1 Tablet By Mouth Once A Day 6)  Zyrtec Allergy 10 Mg  Tabs (Cetirizine Hcl) .... Once Daily 7)  Adult Aspirin Low Strength 81 Mg  Tbdp (Aspirin) .... Once Daily 8)  Analpram-Hc 1-2.5 %  Crea (Hydrocortisone Ace-Pramoxine) .... Apply Pr Q Hs Prn 9)  Zocor 20 Mg  Tabs (Simvastatin) .... Two Times A Day 10)  Astepro 0.15 % Soln (Azelastine Hcl) .... Two Sprays Q Nostril Daily 11)  Iron 25 Mg Tabs (Iron) .[name].. 1 Once Daily 12)  Mobic 15 Mg Tabs (Meloxicam) .[name].. 1 Once Daily 13)  Tussionex Pennkinetic Er 8-10 Mg/33ml Lqcr (Chlorpheniramine-Hydrocodone) .[name].. 1 Tsp Every 12 Hours As Needed Cough 14)  Vitamin E 200 Unit Caps (Vitamin E) .[name].. 1 Once Daily 15)  Vitamin C 1000 Mg Tabs (Ascorbic Acid) .[name].. 1 Once Daily 16)  Multivitamins  Caps (Multiple Vitamin) .[name].. 1 Once Daily  Current Medications (verified): 1)  Nascobal 500 Mcg/0.64ml Nasal Soln (Cyanocobalamin) .... in One Nostril Evert 2 Weeks 2)  Calcium 500 and Vitamim D 300 .[name].. 3 Once Daily 3)  Aciphex 20 Mg  Tbec (Rabeprazole Sodium) .... Take 1 Capsule By Mouth Once A Day 4)  Proscar 5 Mg  Tabs (Finasteride) .... Take 1 Tablet By Mouth Once A Day 5)  Zinc   Tabs (Zinc Tabs) .... Take 1 Tablet By Mouth Once A Day 6)  Zyrtec Allergy 10 Mg  Tabs (Cetirizine Hcl) .... Once Daily 7)  Adult Aspirin Low Strength 81 Mg  Tbdp (Aspirin) .... Once Daily 8)  Analpram-Hc 1-2.5 %  Crea (Hydrocortisone  Ace-Pramoxine) .... Apply Pr Q Hs Prn 9)  Zocor 20 Mg  Tabs (Simvastatin) .... Two Times A Day 10)  Iron 25 Mg Tabs (Iron) .[name].. 1 Once Daily 11)  Mobic 15 Mg Tabs (Meloxicam) .[name].. 1 Once Daily 12)  Tussionex Pennkinetic Er 8-10 Mg/24ml Lqcr (Chlorpheniramine-Hydrocodone) .[name].. 1 Tsp Every 12 Hours As Needed Cough 13)  Vitamin E 200 Unit Caps (Vitamin E) .[name].. 1 Once Daily 14)  Vitamin C 1000 Mg Tabs (Ascorbic Acid) .[name].. 1 Once Daily 15)  Multivitamins  Caps (Multiple Vitamin) .[name].. 1 Once Daily 16)  Amlodipine Besylate 2.5 Mg Tabs (Amlodipine Besylate) .... One By Mouth Daily  Allergies (verified): No Known Drug Allergies  Past History:  Family History: Last updated: 03/07/2008 No FH of Colon Cancer: Family History of Heart Disease: Mother  Social History: Last updated: 03/07/2008 Retired Married with children Never Smoked Alcohol Use - yes- occasionally  Risk Factors: Smoking Status: never (06/02/2009)  Past medical, surgical, family and social histories (including risk factors) reviewed, and no changes noted (except as noted below).  Past Medical History: Reviewed history from 12/08/2008 and no changes required. Elevated PSA GERD Allergies Hyperlipidemia Hypertension abnormal platlet count  Past Surgical History: Reviewed history from 03/07/2008 and no changes required. Cholecystectomy Appendectomy Rotator Cuff Repair Tonsillectomy  Family History: Reviewed history from 03/07/2008 and no changes required. No FH of Colon Cancer: Family History of Heart Disease: Mother  Social History: Reviewed history from 03/07/2008 and no changes required. Retired Married with children Never Smoked Alcohol Use - yes- occasionally  Review of Systems  The patient denies anorexia, fever, weight loss, weight gain, vision loss, decreased hearing, hoarseness, chest pain, syncope, dyspnea on exertion, peripheral edema, prolonged cough, headaches, hemoptysis, abdominal pain,  melena, hematochezia, severe indigestion/heartburn, hematuria, incontinence, genital sores, muscle weakness, suspicious skin lesions, transient blindness, difficulty walking, depression, unusual weight change, abnormal bleeding, enlarged lymph nodes, angioedema, breast masses, and testicular masses.    Physical Exam  General:  Well developed, well nourished, no acute distress. Head:  normocephalic and male-pattern balding.   Eyes:  PERRLA, no icterus. Ears:  R ear normal and L ear normal.   Nose:  no external deformity and no nasal discharge.   Mouth:  pharynx pink and moist and no erythema.   Neck:  No deformities, masses, or tenderness noted. Lungs:  normal respiratory effort and no wheezes.   Heart:  normal rate and regular rhythm.   Abdomen:  soft and non-tender.  spleen cound not be appreciated liver normal size Msk:  normal ROM, no joint swelling, and no joint warmth.   Extremities:  cold extremities Neurologic:  alert & oriented X3 and DTRs symmetrical and normal.     Impression & Recommendations:  Problem # 1:  THROMBOCYTHEMIA (  ICD-238.71) Assessment Unchanged stable  Problem # 2:  HYPERTENSION (ICD-401.9) the pt has low grade hypertension with blood pressures in the BP today: 144/60 Prior BP: 160/70 (03/01/2009)  10 Yr Risk Heart Disease: 14 % Prior 10 Yr Risk Heart Disease: 22 % (03/01/2009)  Labs Reviewed: K+: 4.0 (03/01/2009) Creat: : 1.0 (03/01/2009)   Chol: 154 (03/01/2009)   HDL: 46.10 (03/01/2009)   LDL: 63 (12/01/2008)   TG: 99.0 (12/01/2008)  His updated medication list for this problem includes:    Amlodipine Besylate 2.5 Mg Tabs (Amlodipine besylate) ..... One by mouth daily  Problem # 3:  HYPERLIPIDEMIA (ICD-272.4) review of labs and goals His updated medication list for this problem includes:    Zocor 20 Mg Tabs (Simvastatin) .[name]..[name] Two times a day  Labs Reviewed: SGOT: 28 (03/01/2009)   SGPT: 29 (03/01/2009)  Lipid Goals: Chol Goal: 200  (12/08/2008)   HDL Goal: 40 (12/08/2008)   LDL Goal: 130 (12/08/2008)   TG Goal: 150 (12/08/2008)  10 Yr Risk Heart Disease: 14 % Prior 10 Yr Risk Heart Disease: 22 % (03/01/2009)   HDL:46.10 (03/01/2009), 44.70 (12/01/2008)  LDL:63 (12/01/2008), 87 (07/21/2008)  Chol:154 (03/01/2009), 127 (12/01/2008)  Trig:99.0 (12/01/2008), 124 (07/21/2008)  Problem # 4:  RAYNAUD'S SYNDROME (ICD-443.0)  Complete Medication List: 1)  Nascobal 500 Mcg/0.24ml Nasal Soln (Cyanocobalamin) .... In one nostril evert 2 weeks 2)  Calcium 500 and Vitamim D 300  .[name].. 3 once daily 3)  Aciphex 20 Mg Tbec (Rabeprazole sodium) .... Take 1 capsule by mouth once a day 4)  Proscar 5 Mg Tabs (Finasteride) .... Take 1 tablet by mouth once a day 5)  Zinc Tabs (Zinc tabs) .... Take 1 tablet by mouth once a day 6)  Zyrtec Allergy 10 Mg Tabs (Cetirizine hcl) .... Once daily 7)  Adult Aspirin Low Strength 81 Mg Tbdp (Aspirin) .... Once daily 8)  Analpram-hc 1-2.5 % Crea (Hydrocortisone ace-pramoxine) .... Apply pr q hs prn 9)  Zocor 20 Mg Tabs (Simvastatin) .... Two times a day 10)  Iron 25 Mg Tabs (Iron) .[name].. 1 once daily 11)  Mobic 15 Mg Tabs (Meloxicam) .[name].. 1 once daily 12)  Tussionex Pennkinetic Er 8-10 Mg/41ml Lqcr (Chlorpheniramine-hydrocodone) .[name].. 1 tsp every 12 hours as needed cough 13)  Vitamin E 200 Unit Caps (Vitamin e) .[name].. 1 once daily 14)  Vitamin C 1000 Mg Tabs (Ascorbic acid) .[name].. 1 once daily 15)  Multivitamins Caps (Multiple vitamin) .[name].. 1 once daily 16)  Amlodipine Besylate 2.5 Mg Tabs (Amlodipine besylate) .... One by mouth daily  Hypertension Assessment/Plan:      The patient's hypertensive risk group is category B: At least one risk factor (excluding diabetes) with no target organ damage.  His calculated 10 year risk of coronary heart disease is 14 %.  Today's blood pressure is 144/60.  His blood pressure goal is < 140/90.  Patient Instructions: 1)  Please schedule a follow-up appointment in 1  month. Prescriptions: AMLODIPINE BESYLATE 2.5 MG TABS (AMLODIPINE BESYLATE) one by mouth daily  #30 x 11   Entered and Authorized by:   Stacie Glaze MD   Signed by:   Stacie Glaze MD on 06/02/2009   Method used:   Electronically to        [facility] [address] [phone]* (retail)       [address]       [place], [place]  [address]  Ph: [phone] or [phone]       Fax: [phone]   RxID:   [id number]     Prevention & Chronic Care Immunizations   Influenza vaccine: Historical  (03/01/2009)   Influenza vaccine due: 01/18/2010    Tetanus booster: 05/20/2005: Historical   Tetanus booster due: 05/21/2015    Pneumococcal vaccine: Historical  (05/20/2005)   Pneumococcal vaccine due: 05/20/2010    H. zoster vaccine: Not documented  Colorectal Screening   Hemoccult: Not documented    Colonoscopy: Location:  [facility].    (09/08/2008)   Colonoscopy action/deferral: Repeat colonoscopy in 5 years.   (09/06/2003)   Colonoscopy due: 09/2018  Other Screening   PSA: 1.65  (03/01/2009)   Smoking status: never  (06/02/2009)  Lipids   Total Cholesterol: 154  (03/01/2009)   LDL: 63  (12/01/2008)   LDL Direct: 84.9  (03/01/2009)   HDL: 46.10  (03/01/2009)   Triglycerides: 99.0  (12/01/2008)    SGOT (AST): 28  (03/01/2009)   SGPT (ALT): 29  (03/01/2009)   Alkaline phosphatase: 50  (03/01/2009)   Total bilirubin: 1.1  (03/01/2009)  Hypertension   Last Blood Pressure: 144 / 60  (06/02/2009)   Serum creatinine: 1.0  (03/01/2009)   Serum potassium 4.0  (03/01/2009)  Self-Management Support :    Hypertension self-management support: Not documented    Lipid self-management support: Not documented

## 2010-06-19 NOTE — Letter (Signed)
Summary: [facility]  [facility]   Imported By: Sherian Rein 04/26/2010 08:22:15  _____________________________________________________________________  External Attachment:    Type:   Image     Comment:   External Document

## 2010-06-19 NOTE — Progress Notes (Signed)
Summary: [facility]   Imported By: Sherian Rein 03/19/2010 14:26:29  _____________________________________________________________________  External Attachment:    Type:   Image     Comment:   External Document

## 2010-06-19 NOTE — Procedures (Signed)
Summary: [facility]  [facility]   Imported By: Sherian Rein 03/19/2010 14:14:12  _____________________________________________________________________  External Attachment:    Type:   Image     Comment:   External Document

## 2010-06-19 NOTE — Assessment & Plan Note (Signed)
Summary: worsening lower abd pain/pl                 perry pt   History of Present Illness Visit Type: Follow-up Visit Primary GI MD: [name] MD Primary Provider: Darryll Capers, MD Requesting Provider: na Chief Complaint: generalized abdominal pain about two weeks ago that felt like gas.  This pain has decreased.  Pt is also having a intermittent stabbing pain in the LUQ.  Pt has a known hernia on lower right side.  Pt is also having early satiety and feels full longer after meals than usual History of Present Illness:   78 YO MALE KNOWN TO DR. [name]. HE HAS HX OF GERD,COLON POLYPS,CHRONIC THROMBOCYTOPENIA, HTN,HYPERLIPIDEMIA, ANXIETY.  HE UNDERWENT COLONOSCOPY 4/10 WITH FINDING OF A DIVERTICULUM HEPATIC FLEXURE, ,2 HYPERPLASTIC POLYPS. HE COMES IN TODAY WITH C/O ONE WEEK HX OF LOWER ABDOMINAL PAIN. HE HAD ONSET OF BILATERAL LOWER ABDOMINAL PAIN/GAS WHICH LASTED FOR 24 HOURS,THEN FELT FULL BLOATED FOR A COUPLE DAYS. NO FEVER, NO NAUSEA,VOMITING.  3 DAYS AGO HE HAD INTERMITTENT STABBING PAINS IN HIS LUQ. HE TOOK LAXATIVES  AND FELT BETTER. HE HAS NOW BEEN USING MIRALAX DAILY X 3 DAYS WITH GOOD RESULTS,PAIN HAS RESOLVED.NO URINARY SXS, NO MELENA HEME.   GI Review of Systems    Reports abdominal pain, bloating, and  nausea.     Location of  Abdominal pain: LUQ, Generalized.    Denies acid reflux, belching, chest pain, dysphagia with liquids, dysphagia with solids, heartburn, loss of appetite, vomiting, vomiting blood, weight loss, and  weight gain.        Denies anal fissure, black tarry stools, change in bowel habit, constipation, diarrhea, diverticulosis, fecal incontinence, heme positive stool, hemorrhoids, irritable bowel syndrome, jaundice, light color stool, liver problems, rectal bleeding, and  rectal pain. Preventive Screening-Counseling & Management      Drug Use:  no.      Current Medications (verified): 1)  Nascobal 500 Mcg/0.52ml Nasal Soln (Cyanocobalamin) .... in One Nostril  Every Week 2)  Calcium 500 and Vitamim D 300 .[name].. 3 Once Daily 3)  Aciphex 20 Mg  Tbec (Rabeprazole Sodium) .... Take 2 Capsule By Mouth Once A Day 4)  Zinc   Tabs (Zinc Tabs) .... Take 1 Tablet By Mouth Once A Day 5)  Zyrtec Allergy 10 Mg  Tabs (Cetirizine Hcl) .... Once Daily 6)  Adult Aspirin Low Strength 81 Mg  Tbdp (Aspirin) .... Once Daily 7)  Analpram-Hc 1-2.5 %  Crea (Hydrocortisone Ace-Pramoxine) .... Apply Pr Q Hs Prn 8)  Zocor 20 Mg  Tabs (Simvastatin) .... Two Times A Day 9)  Iron 60 Mg Tabs (Iron) .[name].. 1 Once Daily 10)  Tussionex Pennkinetic Er 8-10 Mg/65ml Lqcr (Chlorpheniramine-Hydrocodone) .[name].. 1 Tsp Every 12 Hours As Needed Cough As Needed 11)  Vitamin C 1000 Mg Tabs (Ascorbic Acid) .[name].. 1 Once Daily 12)  Multivitamins  Caps (Multiple Vitamin) .[name].. 1 Once Daily 13)  Amlodipine Besylate 2.5 Mg Tabs (Amlodipine Besylate) .... One By Mouth Daily 14)  Vitamin D3 1000 Unit Caps (Cholecalciferol) .... One By Mouth Daily 15)  Uroxatral 10 Mg Xr24h-Tab (Alfuzosin Hcl) .[name].. 1 Once Daily 16)  Avodart 0.5 Mg Caps (Dutasteride) .[name].. 1 Once Daily 17)  Meloxicam 7.5 Mg Tabs (Meloxicam) .... 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 No FH of Colon Cancer: Family History of Heart Disease: Mother  Social History: Retired Married with children Never Smoked Alcohol Use - yes- occasionally Illicit Drug Use -  no Drug Use:  no  Review of Systems  The patient denies allergy/sinus, anemia, anxiety-new, arthritis/joint pain, back pain, blood in urine, breast changes/lumps, confusion, cough, coughing up blood, depression-new, fainting, fatigue, fever, headaches-new, hearing problems, heart murmur, heart rhythm changes, itching, muscle pains/cramps, night sweats, nosebleeds, shortness of breath, skin rash, sleeping problems, sore throat, swelling of feet/legs, swollen lymph glands, thirst - excessive, urination - excessive, urination changes/pain, urine leakage, vision changes, and voice change.         SEE HPI  Vital Signs:  Patient profile:   77 year old male Height:      68 inches Weight:      150 pounds BMI:     22.89 Pulse rate:   60 / minute Pulse rhythm:   regular BP sitting:   100 / 60  (left arm) Cuff size:   regular  Vitals Entered By: Francee Piccolo CMA Duncan Dull) (March 16, 2010 2:06 PM)  Physical Exam  General:  Well developed, well nourished, no acute distress. Head:  Normocephalic and atraumatic. Eyes:  PERRLA, no icterus. Lungs:  Clear throughout to auscultation. Heart:  Regular rate and rhythm; no murmurs, rubs,  or bruits. Abdomen:  SOFT, NONTENDER, NO MASS OR HSM, BS=,INCISIONAL SCARS, SMALL RIGHT INGUINAL HERNIA Rectal:  NOT DONE Extremities:  No clubbing, cyanosis, edema or deformities noted. Neurologic:  Alert and  oriented x4;  grossly normal neurologically. Psych:  Alert and cooperative. Normal mood and affect.   Impression & Recommendations:  Problem # 1:  ABDOMINAL PAIN-MULTIPLE SITES (ICD-789.09) Assessment Improved 78 YO MALE WITH ONE WEEK HX OF LOWER ABDOMINAL PAIN, BILATERALLY, ASSOCIATED WITH GAS AND CONSTIPATION. SXS NOW MUCH IMPROVED. NO CLINICAL EVIDENCE FOR DIVERTICULITIS OR OBSTRUCTION. SUSPECT SECONDARY TO OBSTIPATION.   CONTINUE MIRALAX 17 GM DAILY FOR ONE MORE WEEK THEN MAY USE EVERY OTHER DAY. GAS X AS NEEDED. CBC TODAY ADVISED TO CALL IF  PAIN RECURS FOLLOW UP WITH DR. [name] AS NEEDED.  Problem # 2:  DIVERTICULOSIS-COLON (ICD-562.10) Assessment: Comment Only  Problem # 3:  THROMBOCYTHEMIA (ICD-238.71) Assessment: Comment Only  Problem # 4:  COLONIC POLYPS, HX OF (ICD-V12.72) Assessment: Comment Only  Problem # 5:  GERD (ICD-530.81) Assessment: Comment Only STABLE  Other Orders: TLB-CBC Platelet - w/Differential ([id number])  Patient Instructions: 1)  Please go to lab, basement level. 2)  Continue the Miralax daily for the next week. 3)  Then you can try taking  it every other day. 4)  Call us back next week if the pain reoccurs. 5)  Copy sent to : Darryll Capers, Md 6)  The medication list was reviewed and reconciled.  All changed / newly prescribed medications were explained.  A complete medication list was provided to the patient / caregiver.

## 2010-06-19 NOTE — Assessment & Plan Note (Signed)
Summary: 1 MONTH ROV/NJR [facility] BMP/NJR   Vital Signs:  Patient profile:   77 year old male Height:      68 inches Weight:      151 pounds BMI:     23.04 Temp:     98.2 degrees F oral Pulse rate:   68 / minute Resp:     14 per minute BP sitting:   142 / 60  (left arm)  Vitals Entered By: Willy Eddy, LPN (July 06, 2009 1:44 PM) CC: roa- on amlopidpine for 1 month, Hypertension Management   CC:  roa- on amlopidpine for 1 month and Hypertension Management.  Hypertension History:      He denies headache, chest pain, palpitations, dyspnea with exertion, orthopnea, PND, peripheral edema, visual symptoms, neurologic problems, syncope, and side effects from treatment.        Positive major cardiovascular risk factors include male age 25 years old or older, hyperlipidemia, and hypertension.  Negative major cardiovascular risk factors include no history of diabetes and non-tobacco-user status.        Further assessment for target organ damage reveals no history of ASHD, stroke/TIA, or peripheral vascular disease.     Preventive Screening-Counseling & Management  Alcohol-Tobacco     Smoking Status: never  Current Medications (verified): 1)  Nascobal 500 Mcg/0.6ml Nasal Soln (Cyanocobalamin) .... in One Nostril Every Week 2)  Calcium 500 and Vitamim D 300 .[name].. 3 Once Daily 3)  Aciphex 20 Mg  Tbec (Rabeprazole Sodium) .... Take 1 Capsule By Mouth Once A Day 4)  Proscar 5 Mg  Tabs (Finasteride) .... Take 1 Tablet By Mouth Once A Day 5)  Zinc   Tabs (Zinc Tabs) .... Take 1 Tablet By Mouth Once A Day 6)  Zyrtec Allergy 10 Mg  Tabs (Cetirizine Hcl) .... Once Daily 7)  Adult Aspirin Low Strength 81 Mg  Tbdp (Aspirin) .... Once Daily 8)  Analpram-Hc 1-2.5 %  Crea (Hydrocortisone Ace-Pramoxine) .... Apply Pr Q Hs Prn 9)  Zocor 20 Mg  Tabs (Simvastatin) .... Two Times A Day 10)  Iron 25 Mg Tabs (Iron) .[name].. 1 Once Daily 11)  Mobic 15 Mg Tabs (Meloxicam) .[name].. 1 Once Daily 12)  Tussionex  Pennkinetic Er 8-10 Mg/26ml Lqcr (Chlorpheniramine-Hydrocodone) .[name].. 1 Tsp Every 12 Hours As Needed Cough 13)  Vitamin E 200 Unit Caps (Vitamin E) .[name].. 1 Once Daily 14)  Vitamin C 1000 Mg Tabs (Ascorbic Acid) .[name].. 1 Once Daily 15)  Multivitamins  Caps (Multiple Vitamin) .[name].. 1 Once Daily 16)  Amlodipine Besylate 2.5 Mg Tabs (Amlodipine Besylate) .... One By Mouth Daily  Allergies (verified): No Known Drug Allergies  Past History:  Family History: Last updated: 03/07/2008 No FH of Colon Cancer: Family History of Heart Disease: Mother  Social History: Last updated: 03/07/2008 Retired Married with children Never Smoked Alcohol Use - yes- occasionally  Risk Factors: Smoking Status: never (07/06/2009)  Past medical, surgical, family and social histories (including risk factors) reviewed, and no changes noted (except as noted below).  Past Medical History: Reviewed history from 12/08/2008 and no changes required. Elevated PSA GERD Allergies Hyperlipidemia Hypertension abnormal platlet count  Past Surgical History: Reviewed history from 03/07/2008 and no changes required. Cholecystectomy Appendectomy Rotator Cuff Repair Tonsillectomy  Family History: Reviewed history from 03/07/2008 and no changes required. No FH of Colon Cancer: Family History of Heart Disease: Mother  Social History: Reviewed history from 03/07/2008 and no changes required. Retired Married with children Never Smoked Alcohol Use - yes-  occasionally  Review of Systems       The patient complains of anorexia.  The patient denies fever, weight loss, weight gain, vision loss, decreased hearing, hoarseness, chest pain, syncope, dyspnea on exertion, peripheral edema, prolonged cough, headaches, hemoptysis, abdominal pain, melena, hematochezia, severe indigestion/heartburn, hematuria, incontinence, genital sores, muscle weakness, suspicious skin lesions, transient blindness, difficulty walking, depression,  unusual weight change, abnormal bleeding, enlarged lymph nodes, angioedema, breast masses, and testicular masses.    Physical Exam  General:  Well developed, well nourished, no acute distress. Head:  normocephalic and male-pattern balding.   Eyes:  PERRLA, no icterus. Ears:  R ear normal and L ear normal.   Mouth:  pharynx pink and moist and no erythema.   Neck:  No deformities, masses, or tenderness noted. Lungs:  normal respiratory effort and no wheezes.   Heart:  normal rate and regular rhythm.   Abdomen:  soft and non-tender.  spleen cound not be appreciated liver normal size Msk:  normal ROM, no joint swelling, and no joint warmth.   Extremities:  cold extremities Neurologic:  alert & oriented X3 and DTRs symmetrical and normal.     Impression & Recommendations:  Problem # 1:  HYPERTENSION (ICD-401.9) Assessment Improved stay the course for two months His updated medication list for this problem includes:    Amlodipine Besylate 2.5 Mg Tabs (Amlodipine besylate) ..... 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 Take 1 capsule by mouth once a day  EGD: Findings: Stricture:  Location: [facility]   (12/11/2004)  Labs Reviewed: Hgb: 14.6 (05/25/2009)   Hct: 43.2 (05/25/2009)  Complete Medication List: 1)  Nascobal 500 Mcg/0.55ml Nasal Soln (Cyanocobalamin) .... In one nostril every week 2)  Calcium 500 and Vitamim D 300  .[name].. 3 once daily 3)  Aciphex 20 Mg Tbec (Rabeprazole sodium) .... Take 1 capsule  by mouth once a day 4)  Proscar 5 Mg Tabs (Finasteride) .... Take 1 tablet by mouth once a day 5)  Zinc Tabs (Zinc tabs) .... Take 1 tablet by mouth once a day 6)  Zyrtec Allergy 10 Mg Tabs (Cetirizine hcl) .... Once daily 7)  Adult Aspirin Low Strength 81 Mg Tbdp (Aspirin) .... Once daily 8)  Analpram-hc 1-2.5 % Crea (Hydrocortisone ace-pramoxine) .... Apply pr q hs prn 9)  Zocor 20 Mg Tabs (Simvastatin) .... Two times a day 10)  Iron 25 Mg Tabs (Iron) .[name].. 1 once daily 11)  Mobic 15 Mg Tabs (Meloxicam) .[name].. 1 once daily 12)  Tussionex Pennkinetic Er 8-10 Mg/72ml Lqcr (Chlorpheniramine-hydrocodone) .[name].. 1 tsp every 12 hours as needed cough 13)  Vitamin E 200 Unit Caps (Vitamin e) .[name].. 1 once daily 14)  Vitamin C 1000 Mg Tabs (Ascorbic acid) .[name].. 1 once daily 15)  Multivitamins Caps (Multiple vitamin) .[name].. 1 once daily 16)  Amlodipine Besylate 2.5 Mg Tabs (Amlodipine besylate) .... One by mouth daily  Other Orders: T-Bone Densitometry [phone])  Hypertension Assessment/Plan:      The patient's hypertensive risk group is category B:  At least one risk factor (excluding diabetes) with no target organ damage.  His calculated 10 year risk of coronary heart disease is 14 %.  Today's blood pressure is 142/60.  His blood pressure goal is < 140/90.  Patient Instructions: 1)  Please schedule a follow-up appointment in 2 months. 2)  CBC w/ Diff prior to visit, ICD-9:995.20 3)  Hepatic Panel prior to visit, ICD-9:401.90

## 2010-06-19 NOTE — Letter (Signed)
Summary: CT Letter  [facility]  [address] [place], [place] [address]   Phone: [phone]  Fax: [phone]    [facility] CT Division  04/23/2010  Patient's Name: Corey Fischer MRN: [id number]  The [place] (Patient Protection and [employer] Act, Sections [phone] and [phone], dated March 23. 2010) mandates that we provide information to Medicare/Medicaid patients on facilities other than those owned by [employer].   Section [phone] of the Act amends the in-office ancillary services exception under [place] laws by requiring a referring physician to inform patients in writing, at the time of a referral, that the patients may obtain specified imaging services such as a CT exam from an entity other than the designated imaging equipment requested from the referring physician.  The provision specifically requires the referring physician to provide the patient with a written list of suppliers who furnish such services in the area in which the patient resides.  [facility] imaging is pleased to provide you with the Medicare required information as it relates to CT services so that you an make an informed decision. [facility] takes pride in delivering high quality services where same day and next day exams are available. When your exam is performed at the [facility] CT scanner, the results are accessible to physicians and are retained in your electronic medical chart, where your physician can review the results at any time and compare them to any previous or future images you may have.  We hope you will continue to allow [place] the privilege of using our [facility] services. Your scheduler has provided you a list of Imaging Centers within a radius of this office and your signature below indicates you have been given this information to make a wise choice.         Patient Signature:            Date:         The above signature verifies that the patient was given this letter and a  choice in their imaging location.

## 2010-06-19 NOTE — Procedures (Signed)
Summary: Upper Endoscopy  Patient: Corey Fischer Note: All result statuses are Final unless otherwise noted.  Tests: (1) Upper Endoscopy (EGD)   EGD Upper Endoscopy       DONE     [facility]     [address] [place], [place]  [address]           ENDOSCOPY PROCEDURE REPORT           PATIENT:  [name], [name]  MR#:  [id number]     BIRTHDATE:  Sep 27, 1931, 78 yrs. old  GENDER:  male           ENDOSCOPIST:  Wilhemina Bonito. Eda Keys, MD     Referred by:  Office           PROCEDURE DATE:  04/25/2010     PROCEDURE:  EGD, diagnostic 01027     ASA CLASS:  Class II     INDICATIONS:  abdominal pain           MEDICATIONS:   Fentanyl 50 mcg IV, Versed 5 mg     TOPICAL ANESTHETIC:  Cetacaine Spray           DESCRIPTION OF PROCEDURE:   After the risks benefits and     alternatives of the procedure were thoroughly explained, informed     consent was obtained.  The Pentax Gastroscope [id number] endoscope     was introduced through the mouth and advanced to the third portion     of the duodenum, without limitations.  The instrument was slowly     withdrawn as the mucosa was fully examined.     <<PROCEDUREIMAGES>>           The upper, middle, and distal third of the esophagus were     carefully inspected and no abnormalities were noted. The z-line     was well seen at the GEJ.A benign ring-like 16mm stricture was     found in the distal esophagus The endoscope was pushed into the     fundus which was normal including a retroflexed view. The     antrum,gastric body, first and second part of the duodenum were     unremarkable except for several small benign fundic gland type     polyps in the body of the stomach.   Retroflexed views revealed no     abnormalities.    The scope was then withdrawn from the patient     and the procedure completed.           COMPLICATIONS:  None           ENDOSCOPIC IMPRESSION:     1) Benign sStricture in the distal esophagus     2) Small benign Pedunculated polyp  in the body of the stomach     3) Normal EGD otherwise     4) GERD     RECOMMENDATIONS:     1) Continue current medications     2) Follow-up as needed           ______________________________     Wilhemina Bonito. Eda Keys, MD           CC:  Stacie Glaze, MD, The Patient           n.     eSIGNEDWilhemina Bonito. Eda Keys at 04/25/2010 09:32 AM           Oletta Lamas, [phone]  Note: An exclamation mark [name])  indicates a result that was not dispersed into the flowsheet. Document Creation Date: 04/25/2010 9:33 AM _______________________________________________________________________  (1) Order result status: Final Collection or observation date-time: 04/25/2010 09:14 Requested date-time:  Receipt date-time:  Reported date-time:  Referring Physician:   Ordering Physician: Fransico Setters [phone]) Specimen Source:  Source: Launa Grill Order Number: [id number] Lab site:

## 2010-06-19 NOTE — Procedures (Signed)
Summary: [facility]   Imported By: Sherian Rein 03/19/2010 14:22:21  _____________________________________________________________________  External Attachment:    Type:   Image     Comment:   External Document

## 2010-06-19 NOTE — Progress Notes (Signed)
Summary[facility]   Imported By: Sherian Rein 03/19/2010 14:25:34  _____________________________________________________________________  External Attachment:    Type:   Image     Comment:   External Document

## 2010-06-19 NOTE — Letter (Signed)
[facility]   Imported By: Maryln Gottron 09/15/2009 10:44:50  _____________________________________________________________________  External Attachment:    Type:   Image     Comment:   External Document

## 2010-06-19 NOTE — Letter (Signed)
Summary: [facility]  [facility]   Imported By: Maryln Gottron 10/31/2009 13:42:32  _____________________________________________________________________  External Attachment:    Type:   Image     Comment:   External Document

## 2010-06-19 NOTE — Assessment & Plan Note (Signed)
Summary: 2 MTH ROV // RS/pt rescd from bump//ccm   Vital Signs:  Patient profile:   77 year old male Height:      68 inches Weight:      150 pounds BMI:     22.89 Temp:     98.6 degrees F oral Pulse rate:   64 / minute Resp:     14 per minute BP sitting:   130 / 60  (left arm)  Vitals Entered By: Willy Eddy, LPN (September 14, 2009 3:12 PM) CC: roa labs, Hypertension Management   Primary Care Provider:  Darryll Capers, MD  CC:  roa labs and Hypertension Management.  History of Present Illness: monitering the blood pressure on the 2.5 of altace  Follow-Up Visit      This is a 77 year old man who presents for Follow-up visit.  for platlet and liver moniteirng.  The patient denies chest pain, palpitations, dizziness, syncope, low blood sugar symptoms, high blood sugar symptoms, edema, SOB, DOE, PND, and orthopnea.  Since the last visit the patient notes no new problems or concerns.  The patient reports taking meds as prescribed.  When questioned about possible medication side effects, the patient notes none.    Hypertension History:      He denies headache, chest pain, palpitations, dyspnea with exertion, orthopnea, PND, peripheral edema, visual symptoms, neurologic problems, syncope, and side effects from treatment.        Positive major cardiovascular risk factors include male age 40 years old or older, hyperlipidemia, and hypertension.  Negative major cardiovascular risk factors include no history of diabetes and non-tobacco-user status.        Further assessment for target organ damage reveals no history of ASHD, stroke/TIA, or peripheral vascular disease.     Preventive Screening-Counseling & Management  Alcohol-Tobacco     Smoking Status: never  Problems Prior to Update: 1)  Idiopathic Osteoporosis  (ICD-733.02) 2)  Raynaud's Syndrome  (ICD-443.0) 3)  Prostatitis, Recurrent  (ICD-601.9) 4)  Constipation, Chronic  (ICD-564.09) 5)  Thrombocythemia  (ICD-238.71) 6)   Constipation  (ICD-564.00) 7)  Esophageal Stricture  (ICD-530.3) 8)  Colonic Polyps, Hx of  (ICD-V12.72) 9)  Anxiety Depression  (ICD-300.4) 10)  Amaurosis Fugax  (ICD-362.34) 11)  Physical Examination  (ICD-V70.0) 12)  Carotid Artery Stenosis, Bilateral  (ICD-433.10) 13)  Unspecified Visual Loss  (ICD-369.9) 14)  Internal Hemorrhoids Without Mention Comp  (ICD-455.0) 15)  Chest Wall Pain, Anterior  (ICD-786.52) 16)  Inguinal Hernia, Right  (ICD-550.90) 17)  Hypertension  (ICD-401.9) 18)  Vitamin B12 Deficiency  (ICD-266.2) 19)  Hyperlipidemia  (ICD-272.4) 20)  Gerd  (ICD-530.81)  Current Problems (verified): 1)  Idiopathic Osteoporosis  (ICD-733.02) 2)  Raynaud's Syndrome  (ICD-443.0) 3)  Prostatitis, Recurrent  (ICD-601.9) 4)  Constipation, Chronic  (ICD-564.09) 5)  Thrombocythemia  (ICD-238.71) 6)  Constipation  (ICD-564.00) 7)  Esophageal Stricture  (ICD-530.3) 8)  Colonic Polyps, Hx of  (ICD-V12.72) 9)  Anxiety Depression  (ICD-300.4) 10)  Amaurosis Fugax  (ICD-362.34) 11)  Physical Examination  (ICD-V70.0) 12)  Carotid Artery Stenosis, Bilateral  (ICD-433.10) 13)  Unspecified Visual Loss  (ICD-369.9) 14)  Internal Hemorrhoids Without Mention Comp  (ICD-455.0) 15)  Chest Wall Pain, Anterior  (ICD-786.52) 16)  Inguinal Hernia, Right  (ICD-550.90) 17)  Hypertension  (ICD-401.9) 18)  Vitamin B12 Deficiency  (ICD-266.2) 19)  Hyperlipidemia  (ICD-272.4) 20)  Gerd  (ICD-530.81)  Medications Prior to Update: 1)  Nascobal 500 Mcg/0.63ml Nasal Soln (Cyanocobalamin) .... in One Nostril  Every Week 2)  Calcium 500 and Vitamim D 300 .[name].. 3 Once Daily 3)  Aciphex 20 Mg  Tbec (Rabeprazole Sodium) .... Take 1 Capsule By Mouth Once A Day 4)  Proscar 5 Mg  Tabs (Finasteride) .... Take 1 Tablet By Mouth Once A Day 5)  Zinc   Tabs (Zinc Tabs) .... Take 1 Tablet By Mouth Once A Day 6)  Zyrtec Allergy 10 Mg  Tabs (Cetirizine Hcl) .... Once Daily 7)  Adult Aspirin Low Strength 81 Mg  Tbdp  (Aspirin) .... Once Daily 8)  Analpram-Hc 1-2.5 %  Crea (Hydrocortisone Ace-Pramoxine) .... Apply Pr Q Hs Prn 9)  Zocor 20 Mg  Tabs (Simvastatin) .... Two Times A Day 10)  Iron 25 Mg Tabs (Iron) .[name].. 1 Once Daily 11)  Mobic 15 Mg Tabs (Meloxicam) .[name].. 1 Once Daily 12)  Tussionex Pennkinetic Er 8-10 Mg/42ml Lqcr (Chlorpheniramine-Hydrocodone) .[name].. 1 Tsp Every 12 Hours As Needed Cough 13)  Vitamin E 200 Unit Caps (Vitamin E) .[name].. 1 Once Daily 14)  Vitamin C 1000 Mg Tabs (Ascorbic Acid) .[name].. 1 Once Daily 15)  Multivitamins  Caps (Multiple Vitamin) .[name].. 1 Once Daily 16)  Amlodipine Besylate 2.5 Mg Tabs (Amlodipine Besylate) .... One By Mouth Daily  Current Medications (verified): 1)  Nascobal 500 Mcg/0.32ml Nasal Soln (Cyanocobalamin) .... in One Nostril Every Week 2)  Calcium 500 and Vitamim D 300 .[name].. 3 Once Daily 3)  Aciphex 20 Mg  Tbec (Rabeprazole Sodium) .... Take 1 Capsule By Mouth Once A Day 4)  Proscar 5 Mg  Tabs (Finasteride) .... Take 1 Tablet By Mouth Once A Day 5)  Zinc   Tabs (Zinc Tabs) .... Take 1 Tablet By Mouth Once A Day 6)  Zyrtec Allergy 10 Mg  Tabs (Cetirizine Hcl) .... Once Daily 7)  Adult Aspirin Low Strength 81 Mg  Tbdp (Aspirin) .... Once Daily 8)  Analpram-Hc 1-2.5 %  Crea (Hydrocortisone Ace-Pramoxine) .... Apply Pr Q Hs Prn 9)  Zocor 20 Mg  Tabs (Simvastatin) .... Two Times A Day 10)  Iron 25 Mg Tabs (Iron) .[name].. 1 Once Daily 11)  Mobic 15 Mg Tabs (Meloxicam) .[name].. 1 Once Daily 12)  Tussionex Pennkinetic Er 8-10 Mg/85ml Lqcr (Chlorpheniramine-Hydrocodone) .[name].. 1 Tsp Every 12 Hours As Needed Cough 13)  Vitamin E 200 Unit Caps (Vitamin E) .[name].. 1 Once Daily 14)  Vitamin C 1000 Mg Tabs (Ascorbic Acid) .[name].. 1 Once Daily 15)  Multivitamins  Caps (Multiple Vitamin) .[name].. 1 Once Daily 16)  Amlodipine Besylate 2.5 Mg Tabs (Amlodipine Besylate) .... One By Mouth Daily 17)  Vitamin D3 1000 Unit Caps (Cholecalciferol) .... One By Mouth Daily  Allergies (verified): No Known Drug  Allergies  Past History:  Family History: Last updated: 03/07/2008 No FH of Colon Cancer: Family History of Heart Disease: Mother  Social History: Last updated: 03/07/2008 Retired Married with children Never Smoked Alcohol Use - yes- occasionally  Risk Factors: Smoking Status: never (09/14/2009)  Past medical, surgical, family and social histories (including risk factors) reviewed, and no changes noted (except as noted below).  Past Medical History: Reviewed history from 12/08/2008 and no changes required. Elevated PSA GERD Allergies Hyperlipidemia Hypertension abnormal platlet count  Past Surgical History: Reviewed history from 03/07/2008 and no changes required. Cholecystectomy Appendectomy Rotator Cuff Repair Tonsillectomy  Family History: Reviewed history from 03/07/2008 and no changes required. No FH of Colon Cancer: Family History of Heart Disease: Mother  Social History: Reviewed history from 03/07/2008 and no changes required. Retired Married  with children Never Smoked Alcohol Use - yes- occasionally  Review of Systems  The patient denies anorexia, fever, weight loss, weight gain, vision loss, decreased hearing, hoarseness, chest pain, syncope, dyspnea on exertion, peripheral edema, prolonged cough, headaches, hemoptysis, abdominal pain, melena, hematochezia, severe indigestion/heartburn, hematuria, incontinence, genital sores, muscle weakness, suspicious skin lesions, transient blindness, difficulty walking, depression, unusual weight change, abnormal bleeding, enlarged lymph nodes, angioedema, and breast masses.    Physical Exam  General:  Well developed, well nourished, no acute distress. Head:  normocephalic and male-pattern balding.   Eyes:  PERRLA, no icterus. Ears:  R ear normal and L ear normal.   Nose:  no external deformity and no nasal discharge.   Mouth:  pharynx pink and moist and no erythema.   Neck:  No deformities, masses, or  tenderness noted. Lungs:  normal respiratory effort and no wheezes.   Heart:  normal rate and regular rhythm.   Abdomen:  soft and non-tender.  spleen cound not be appreciated liver normal size Msk:  normal ROM, no joint swelling, and no joint warmth.   Extremities:  cold extremities Neurologic:  alert & oriented X3 and DTRs symmetrical and normal.     Impression & Recommendations:  Problem # 1:  IDIOPATHIC OSTEOPOROSIS (ICD-733.02)  added vitamin d  Discussed medication use, applications of heat or ice, and exercises.   Problem # 2:  THROMBOCYTHEMIA (ICD-238.71) stable  has drug exposue risks  Problem # 3:  HYPERTENSION (ICD-401.9)  His updated medication list for this problem includes:    Amlodipine Besylate 2.5 Mg Tabs (Amlodipine besylate) ..... One by mouth daily  BP today: 120/60 Prior BP: 142/60 (07/06/2009)  Prior 10 Yr Risk Heart Disease: 14 % (06/02/2009)  Labs Reviewed: K+: 4.0 (03/01/2009) Creat: : 1.0 (03/01/2009)   Chol: 154 (03/01/2009)   HDL: 46.10 (03/01/2009)   LDL: 63 (12/01/2008)   TG: 99.0 (12/01/2008)  Problem # 4:  GERD (ICD-530.81)  His updated medication list for this problem includes:    Aciphex 20 Mg Tbec (Rabeprazole sodium) .[name]... Take 1 capsule by mouth once a day  EGD: Findings: Stricture:  Location: [facility]   (12/11/2004)  Labs Reviewed: Hgb: 14.6 (09/07/2009)   Hct: 42.7 (09/07/2009)  Complete Medication List: 1)  Nascobal 500 Mcg/0.38ml Nasal Soln (Cyanocobalamin) .... In one nostril every week 2)  Calcium 500 and Vitamim D 300  .[name].. 3 once daily 3)  Aciphex 20 Mg Tbec (Rabeprazole sodium) .... Take 1 capsule by mouth once a day 4)  Proscar 5 Mg Tabs (Finasteride) .... Take 1 tablet by mouth once a day 5)  Zinc Tabs (Zinc tabs) .... Take 1 tablet by mouth once a day 6)  Zyrtec Allergy 10 Mg Tabs (Cetirizine hcl) .... Once daily 7)  Adult Aspirin Low Strength 81 Mg Tbdp (Aspirin) .... Once daily 8)  Analpram-hc  1-2.5 % Crea (Hydrocortisone ace-pramoxine) .... Apply pr q hs prn 9)  Zocor 20 Mg Tabs (Simvastatin) .... Two times a day 10)  Iron 25 Mg Tabs (Iron) .[name].. 1 once daily 11)  Mobic 15 Mg Tabs (Meloxicam) .[name].. 1 once daily 12)  Tussionex Pennkinetic Er 8-10 Mg/28ml Lqcr (Chlorpheniramine-hydrocodone) .[name].. 1 tsp every 12 hours as needed cough 13)  Vitamin E 200 Unit Caps (Vitamin e) .[name].. 1 once daily 14)  Vitamin C 1000 Mg Tabs (Ascorbic acid) .[name].. 1 once daily 15)  Multivitamins Caps (Multiple vitamin) .[name].. 1 once daily 16)  Amlodipine Besylate 2.5 Mg Tabs (Amlodipine besylate) .Marland KitchenMarland KitchenMarland Kitchen  One by mouth daily 17)  Vitamin D3 1000 Unit Caps (Cholecalciferol) .... One by mouth daily  Hypertension Assessment/Plan:      The patient's hypertensive risk group is category B: At least one risk factor (excluding diabetes) with no target organ damage.  His calculated 10 year risk of coronary heart disease is 11 %.  Today's blood pressure is 130/60.  His blood pressure goal is < 140/90.  Patient Instructions: 1)  4 months medicare preventative exam 30 min fasting visit

## 2010-06-19 NOTE — Letter (Signed)
Summary: [facility]  [facility]   Imported By: Maryln Gottron 04/09/2010 10:28:04  _____________________________________________________________________  External Attachment:    Type:   Image     Comment:   External Document

## 2010-06-19 NOTE — Letter (Signed)
Summary: EGD Instructions  [facility]  [address] [place], [place] [address]   Phone: [phone]  Fax: [phone]       Corey Fischer    [birth year]    MRN: [id number]       Procedure Day /Date:04-25-2010     Arrival Time: 7;30 AM      Procedure Time: 8:30 AM     Location of Procedure:                     X    [facility] ( Outpatient Registration)  PREPARATION FOR ENDOSCOPY   On 04-25-2010 THE DAY OF THE PROCEDURE:  1.   No solid foods, milk or milk products are allowed after midnight the night before your procedure.  2.   Do not drink anything colored red or purple.  Avoid juices with pulp.  No orange juice.  3.  You may drink clear liquids until 6:30 AM  which is 2 hours before your procedure.                                                                                                CLEAR LIQUIDS INCLUDE: Water Jello Ice Popsicles Tea (sugar ok, no milk/cream) Powdered fruit flavored drinks Coffee (sugar ok, no milk/cream) Gatorade Juice: apple, white grape, white cranberry  Lemonade Clear bullion, consomm, broth Carbonated beverages (any kind) Strained chicken noodle soup Hard Candy   MEDICATION INSTRUCTIONS  Unless otherwise instructed, you should take regular prescription medications with a small sip of water as early as possible the morning of your procedure.         OTHER INSTRUCTIONS  You will need a responsible adult at least 75 years of age to accompany you and drive you home.   This person must remain in the waiting room during your procedure.  Wear loose fitting clothing that is easily removed.  Leave jewelry and other valuables at home.  However, you may wish to bring a book to read or an iPod/MP3 player to listen to music as you wait for your procedure to start.  Remove all body piercing jewelry and leave at home.  Total time from sign-in until discharge is approximately 2-3 hours.  You should go home directly  after your procedure and rest.  You can resume normal activities the day after your procedure.  The day of your procedure you should not:   Drive   Make legal decisions   Operate machinery   Drink alcohol   Return to work  You will receive specific instructions about eating, activities and medications before you leave.    The above instructions have been reviewed and explained to me by   _______________________    I fully understand and can verbalize these instructions _____________________________ Date _________

## 2010-06-19 NOTE — Progress Notes (Signed)
Summary: [facility]   Imported By: Sherian Rein 03/19/2010 14:23:37  _____________________________________________________________________  External Attachment:    Type:   Image     Comment:   External Document

## 2010-06-19 NOTE — Progress Notes (Signed)
Summary[facility]   Imported By: Sherian Rein 03/19/2010 14:30:03  _____________________________________________________________________  External Attachment:    Type:   Image     Comment:   External Document

## 2010-06-19 NOTE — Miscellaneous (Signed)
Summary: BONE DENSITY  Clinical Lists Changes  Orders: Added new Test order of T-Lumbar Vertebral Assessment (77082) - Signed 

## 2010-06-19 NOTE — Assessment & Plan Note (Signed)
Summary: pt will come in asting/njr---PT [facility] (BMP) // RS/pt rsc bmp/cjr   Vital Signs:  Patient profile:   77 year old male Height:      68 inches Weight:      148 pounds BMI:     22.58 Temp:     98.2 degrees F oral Pulse rate:   60 / minute Resp:     14 per minute BP sitting:   110 / 60  (left arm)  Vitals Entered By: Willy Eddy, LPN (March 06, 2010 11:29 AM) CC: annual visit for disease management--fasting this am Is Patient Diabetic? No   Primary Care Provider:  Darryll Capers, MD  CC:  annual visit for disease management--fasting this am.  History of Present Illness: The pt was asked about all immunizations, health maint. services that are appropriate to their age and was given guidance on diet exercize  and weight management    Preventive Screening-Counseling & Management  Alcohol-Tobacco     Smoking Status: never     Passive Smoke Exposure: no     Tobacco Counseling: not indicated; no tobacco use  Problems Prior to Update: 1)  Abdominal Pain-epigastric  (ICD-789.06) 2)  Idiopathic Osteoporosis  (ICD-733.02) 3)  Raynaud's Syndrome  (ICD-443.0) 4)  Prostatitis, Recurrent  (ICD-601.9) 5)  Constipation, Chronic  (ICD-564.09) 6)  Thrombocythemia  (ICD-238.71) 7)  Constipation  (ICD-564.00) 8)  Esophageal Stricture  (ICD-530.3) 9)  Colonic Polyps, Hx of  (ICD-V12.72) 10)  Anxiety Depression  (ICD-300.4) 11)  Amaurosis Fugax  (ICD-362.34) 12)  Physical Examination  (ICD-V70.0) 13)  Carotid Artery Stenosis, Bilateral  (ICD-433.10) 14)  Unspecified Visual Loss  (ICD-369.9) 15)  Internal Hemorrhoids Without Mention Comp  (ICD-455.0) 16)  Chest Wall Pain, Anterior  (ICD-786.52) 17)  Inguinal Hernia, Right  (ICD-550.90) 18)  Hypertension  (ICD-401.9) 19)  Vitamin B12 Deficiency  (ICD-266.2) 20)  Hyperlipidemia  (ICD-272.4) 21)  Gerd  (ICD-530.81)  Current Problems (verified): 1)  Abdominal Pain-epigastric  (ICD-789.06) 2)  Idiopathic Osteoporosis   (ICD-733.02) 3)  Raynaud's Syndrome  (ICD-443.0) 4)  Prostatitis, Recurrent  (ICD-601.9) 5)  Constipation, Chronic  (ICD-564.09) 6)  Thrombocythemia  (ICD-238.71) 7)  Constipation  (ICD-564.00) 8)  Esophageal Stricture  (ICD-530.3) 9)  Colonic Polyps, Hx of  (ICD-V12.72) 10)  Anxiety Depression  (ICD-300.4) 11)  Amaurosis Fugax  (ICD-362.34) 12)  Physical Examination  (ICD-V70.0) 13)  Carotid Artery Stenosis, Bilateral  (ICD-433.10) 14)  Unspecified Visual Loss  (ICD-369.9) 15)  Internal Hemorrhoids Without Mention Comp  (ICD-455.0) 16)  Chest Wall Pain, Anterior  (ICD-786.52) 17)  Inguinal Hernia, Right  (ICD-550.90) 18)  Hypertension  (ICD-401.9) 19)  Vitamin B12 Deficiency  (ICD-266.2) 20)  Hyperlipidemia  (ICD-272.4) 21)  Gerd  (ICD-530.81)  Medications Prior to Update: 1)  Nascobal 500 Mcg/0.48ml Nasal Soln (Cyanocobalamin) .... in One Nostril Every Week 2)  Calcium 500 and Vitamim D 300 .[name].. 3 Once Daily 3)  Aciphex 20 Mg  Tbec (Rabeprazole Sodium) .... Take 2 Capsule By Mouth Once A Day 4)  Proscar 5 Mg  Tabs (Finasteride) .... Take 1 Tablet By Mouth Once A Day 5)  Zinc   Tabs (Zinc Tabs) .... Take 1 Tablet By Mouth Once A Day 6)  Zyrtec Allergy 10 Mg  Tabs (Cetirizine Hcl) .... Once Daily 7)  Adult Aspirin Low Strength 81 Mg  Tbdp (Aspirin) .... Once Daily 8)  Analpram-Hc 1-2.5 %  Crea (Hydrocortisone Ace-Pramoxine) .... Apply Pr Q Hs Prn 9)  Zocor 20 Mg  Tabs (  Simvastatin) .... Two Times A Day 10)  Iron 25 Mg Tabs (Iron) .[name].. 1 Once Daily 11)  Tussionex Pennkinetic Er 8-10 Mg/39ml Lqcr (Chlorpheniramine-Hydrocodone) .[name].. 1 Tsp Every 12 Hours As Needed Cough As Needed 12)  Vitamin C 1000 Mg Tabs (Ascorbic Acid) .[name].. 1 Once Daily 13)  Multivitamins  Caps (Multiple Vitamin) .[name].. 1 Once Daily 14)  Amlodipine Besylate 2.5 Mg Tabs (Amlodipine Besylate) .... One By Mouth Daily 15)  Vitamin D3 1000 Unit Caps (Cholecalciferol) .... One By Mouth Daily  Current Medications  (verified): 1)  Nascobal 500 Mcg/0.66ml Nasal Soln (Cyanocobalamin) .... in One Nostril Every Week 2)  Calcium 500 and Vitamim D 300 .[name].. 3 Once Daily 3)  Aciphex 20 Mg  Tbec (Rabeprazole Sodium) .... Take 2 Capsule By Mouth Once A Day 4)  Proscar 5 Mg  Tabs (Finasteride) .... Take 1 Tablet By Mouth Once A Day 5)  Zinc   Tabs (Zinc Tabs) .... Take 1 Tablet By Mouth Once A Day 6)  Zyrtec Allergy 10 Mg  Tabs (Cetirizine Hcl) .... Once Daily 7)  Adult Aspirin Low Strength 81 Mg  Tbdp (Aspirin) .... Once Daily 8)  Analpram-Hc 1-2.5 %  Crea (Hydrocortisone Ace-Pramoxine) .... Apply Pr Q Hs Prn 9)  Zocor 20 Mg  Tabs (Simvastatin) .... Two Times A Day 10)  Iron 60 Mg Tabs (Iron) .[name].. 1 Once Daily 11)  Tussionex Pennkinetic Er 8-10 Mg/52ml Lqcr (Chlorpheniramine-Hydrocodone) .[name].. 1 Tsp Every 12 Hours As Needed Cough As Needed 12)  Vitamin C 1000 Mg Tabs (Ascorbic Acid) .[name].. 1 Once Daily 13)  Multivitamins  Caps (Multiple Vitamin) .[name].. 1 Once Daily 14)  Amlodipine Besylate 2.5 Mg Tabs (Amlodipine Besylate) .... One By Mouth Daily 15)  Vitamin D3 1000 Unit Caps (Cholecalciferol) .... One By Mouth Daily 16)  Uroxatral 10 Mg Xr24h-Tab (Alfuzosin Hcl) .[name].. 1 Once Daily  Allergies (verified): No Known Drug Allergies  Past History:  Family History: Last updated: 03/07/2008 No FH of Colon Cancer: Family History of Heart Disease: Mother  Social History: Last updated: 03/07/2008 Retired Married with children Never Smoked Alcohol Use - yes- occasionally  Risk Factors: Smoking Status: never (03/06/2010) Passive Smoke Exposure: no (03/06/2010)  Past medical, surgical, family and social histories (including risk factors) reviewed, and no changes noted (except as noted below).  Past Medical History: Reviewed history from 10/13/2009 and no changes required. Elevated PSA GERD Allergies Hyperlipidemia Hypertension abnormal platlet count Hyperplastic Colon Polyps Anxiety  Disorder Depression  Past Surgical History: Reviewed history from 03/07/2008 and no changes required. Cholecystectomy Appendectomy Rotator Cuff Repair Tonsillectomy  Family History: Reviewed history from 03/07/2008 and no changes required. No FH of Colon Cancer: Family History of Heart Disease: Mother  Social History: Reviewed history from 03/07/2008 and no changes required. Retired Married with children Never Smoked Alcohol Use - yes- occasionally Passive Smoke Exposure:  no  Review of Systems  The patient denies anorexia, fever, weight loss, weight gain, vision loss, decreased hearing, hoarseness, chest pain, syncope, dyspnea on exertion, peripheral edema, prolonged cough, headaches, hemoptysis, abdominal pain, melena, hematochezia, severe indigestion/heartburn, hematuria, incontinence, genital sores, muscle weakness, suspicious skin lesions, transient blindness, difficulty walking, depression, unusual weight change, abnormal bleeding, enlarged lymph nodes, angioedema, breast masses, and testicular masses.    Physical Exam  General:  Well developed, well nourished, no acute distress. Head:  normocephalic and male-pattern balding.   Eyes:  PERRLA, no icterus. Ears:  R ear normal and L ear normal.   Nose:  no external deformity  and no nasal discharge.   Mouth:  pharynx pink and moist and no erythema.   Neck:  No deformities, masses, or tenderness noted. Chest Wall:  No deformities, masses, tenderness or gynecomastia noted. Breasts:  No masses or gynecomastia noted Lungs:  normal respiratory effort and no wheezes.   Heart:  normal rate and regular rhythm.   Abdomen:  soft and non-tender.  spleen cound not be appreciated liver normal size Rectal:  normal sphincter tone, external hemorrhoid(s), and internal hemorrhoid(s).   Genitalia:  circumcised and no urethral discharge.   Prostate:  no nodules and 3+ enlarged.   Msk:  No deformity or scoliosis noted of thoracic or lumbar  spine.   Pulses:  R and L carotid,radial,femoral,dorsalis pedis and posterior tibial pulses are full and equal bilaterally Extremities:  No clubbing, cyanosis, edema, or deformity noted with normal full range of motion of all joints.   Neurologic:  alert & oriented X3 and gait normal.     Impression & Recommendations:  Problem # 1:  PHYSICAL EXAMINATION (ICD-V70.0)  Colonoscopy: Location:  [facility].   (09/08/2008) Td Booster: Historical (05/20/2005)   Flu Vax: Historical (03/01/2009)   Pneumovax: Historical (05/20/2005) Chol: 154 (03/01/2009)   HDL: 46.10 (03/01/2009)   LDL: 63 (12/01/2008)   TG: 99.0 (12/01/2008) TSH: 1.03 (03/01/2009)   PSA: 1.65 (03/01/2009) Next Colonoscopy due:: 09/2018 (09/08/2008)  Discussed using sunscreen, use of alcohol, drug use, self testicular exam, routine dental care, routine eye care, routine physical exam, seat belts, multiple vitamins, osteoporosis prevention, adequate calcium intake in diet, and recommendations for immunizations.  Discussed exercise and checking cholesterol.  Discussed gun safety, safe sex, and contraception. Also recommend checking PSA.  Problem # 2:  HYPERTENSION (ICD-401.9)  His updated medication list for this problem includes:    Amlodipine Besylate 2.5 Mg Tabs (Amlodipine besylate) ..... One by mouth daily  Orders: TLB-BMP (Basic Metabolic Panel-BMET) (80048-METABOL)  Complete Medication List: 1)  Nascobal 500 Mcg/0.16ml Nasal Soln (Cyanocobalamin) .... In one nostril every week 2)  Calcium 500 and Vitamim D 300  .[name].. 3 once daily 3)  Aciphex 20 Mg Tbec (Rabeprazole sodium) .... Take 2 capsule by mouth once a day 4)  Proscar 5 Mg Tabs (Finasteride) .... Take 1 tablet by mouth once a day 5)  Zinc Tabs (Zinc tabs) .... Take 1 tablet by mouth once a day 6)  Zyrtec Allergy 10 Mg Tabs (Cetirizine hcl) .... Once daily 7)  Adult Aspirin Low Strength 81 Mg Tbdp (Aspirin) .... Once daily 8)  Analpram-hc 1-2.5 % Crea  (Hydrocortisone ace-pramoxine) .... Apply pr q hs prn 9)  Zocor 20 Mg Tabs (Simvastatin) .... Two times a day 10)  Iron 60 Mg Tabs (iron)  .[name].. 1 once daily 11)  Tussionex Pennkinetic Er 8-10 Mg/75ml Lqcr (Chlorpheniramine-hydrocodone) .[name].. 1 tsp every 12 hours as needed cough as needed 12)  Vitamin C 1000 Mg Tabs (Ascorbic acid) .[name].. 1 once daily 13)  Multivitamins Caps (Multiple vitamin) .[name].. 1 once daily 14)  Amlodipine Besylate 2.5 Mg Tabs (Amlodipine besylate) .... One by mouth daily 15)  Vitamin D3 1000 Unit Caps (Cholecalciferol) .... One by mouth daily 16)  Uroxatral 10 Mg Xr24h-tab (Alfuzosin hcl) .[name].. 1 once daily  Other Orders: TLB-Hepatic/Liver Function Pnl (80076-HEPATIC) TLB-TSH (Thyroid Stimulating Hormone) (84443-TSH) TLB-Lipid Panel (80061-LIPID) Venipuncture (16109) TLB-PSA (Prostate Specific Antigen) (84153-PSA) TLB-B12 + Folate Pnl (60454_09811-B14/NWG) TLB-IBC Pnl (Iron/FE;Transferrin) (83550-IBC)   Orders Added: 1)  Est. Patient 65& > [99397] 2)  TLB-Hepatic/Liver Function Pnl [  80076-HEPATIC] 3)  TLB-BMP (Basic Metabolic Panel-BMET) [80048-METABOL] 4)  TLB-TSH (Thyroid Stimulating Hormone) [84443-TSH] 5)  TLB-Lipid Panel [80061-LIPID] 6)  Venipuncture [36415] 7)  TLB-PSA (Prostate Specific Antigen) [84153-PSA] 8)  TLB-B12 + Folate Pnl [82746_82607-B12/FOL] 9)  TLB-IBC Pnl (Iron/FE;Transferrin) [83550-IBC] 10)  Est. Patient Level II [16109]  Appended Document: Orders Update    Clinical Lists Changes  Orders: Added new Service order of Specimen Handling (60454) - Signed

## 2010-06-19 NOTE — Assessment & Plan Note (Signed)
Summary: STOMACH ISSUES/DLO   Vital Signs:  Patient profile:   77 year old male Weight:      150 pounds BMI:     22.89 Temp:     97.6 degrees F oral Pulse rate:   50 / minute Pulse rhythm:   regular BP sitting:   122 / 52  (left arm) Cuff size:   regular  Vitals Entered By: Lamar Sprinkles, CMA (April 21, 2010 11:36 AM) CC: upper abd pain x 2 days   History of Present Illness: CC: upper abd pain  1d h/o upper abd pain, started yesterday at noon.  prior dinner had steak and chocolate during day.  Tenderness across upper abdomen, seems to start RUQ and travels across bandline distribution to epigastric and LUQ.  No radiation to back, no recent EtOH.  Sometimes sharp, sometimes dull.  Currently ache and burning.  Slept poorly last night.  + belching, aciphex controlling reflux ok.  Currently about 3-4/10.  Last stool yesterday.  Mainly more belching than usual.  has been on aciphex for years.  no night sweats, no recent weight loss.    This week restarted spin class and yoga.  No change in foods recently.  No fevers/chills, nausea/vomiting, d/c.  No chnages in bowel habits, no blood in stool.  Voiding normally, no burning, no blood.  No other pain.  No SOB, chest pain or tightness, not exertional.  This feels different from normal reflux sxs which are well controlled on aciphex.  eating/swallowing fine.  h/o strictures.  Has seen Dr. Marina Goodell 1 mo ago 2/2 lower abd pain, thought 2/2 obstipation so increased miralax, stopped taking meloxicam and improved.  Currently on miralax every few days.  Stools pretty regular.  now back on mobic x 1 mo - for hip pain.  recent steroid injection Tuesday by [facility].  s/p cholecystectomy and appendectomy.  Current Medications (verified): 1)  Nascobal 500 Mcg/0.42ml Nasal Soln (Cyanocobalamin) .... in One Nostril Every Week 2)  Calcium 500 and Vitamim D 300 .[name].. 3 Once Daily 3)  Aciphex 20 Mg  Tbec (Rabeprazole Sodium) .... Take 2 Capsule By Mouth  Once A Day 4)  Zinc   Tabs (Zinc Tabs) .... Take 1 Tablet By Mouth Once A Day 5)  Zyrtec Allergy 10 Mg  Tabs (Cetirizine Hcl) .... Once Daily 6)  Adult Aspirin Low Strength 81 Mg  Tbdp (Aspirin) .... Once Daily 7)  Analpram-Hc 1-2.5 %  Crea (Hydrocortisone Ace-Pramoxine) .... Apply Pr Q Hs Prn 8)  Zocor 20 Mg  Tabs (Simvastatin) .... Two Times A Day 9)  Iron 60 Mg Tabs (Iron) .[name].. 1 Once Daily 10)  Tussionex Pennkinetic Er 8-10 Mg/87ml Lqcr (Chlorpheniramine-Hydrocodone) .[name].. 1 Tsp Every 12 Hours As Needed Cough As Needed 11)  Vitamin C 1000 Mg Tabs (Ascorbic Acid) .[name].. 1 Once Daily 12)  Multivitamins  Caps (Multiple Vitamin) .[name].. 1 Once Daily 13)  Amlodipine Besylate 2.5 Mg Tabs (Amlodipine Besylate) .... One By Mouth Daily 14)  Vitamin D3 1000 Unit Caps (Cholecalciferol) .... One By Mouth Daily 15)  Uroxatral 10 Mg Xr24h-Tab (Alfuzosin Hcl) .[name].. 1 Once Daily 16)  Avodart 0.5 Mg Caps (Dutasteride) .[name].. 1 Once Daily 17)  Meloxicam 7.5 Mg Tabs (Meloxicam) .... 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 MILD DIVERTICULOSIS OSTEOARTHRITIS GERD/ STRICTURE DILATED 2006 Allergies Hyperlipidemia Hypertension ELEVATED PSA Anxiety Disorder  Depression  Past Surgical History: Last updated: 03/16/2010 Cholecystectomy Appendectomy Rotator Cuff Repair Tonsillectomy COLONOSCOPY 6/10 ([name]), HYPERPLASTIC POLYPS, MILD DIVERTICULOSIS EGD 2006 ,SMALL HIATAL HERNIA, DISTAL STRICTURE MALONEY DILATED  Family History: Last updated: 03/07/2008 No FH of Colon Cancer: Family History of Heart Disease: Mother  Social History: Last updated: 03/16/2010 Retired Married with children Never Smoked Alcohol Use - yes- occasionally Illicit Drug Use - no  Review of Systems       per HPI  Physical Exam  General:  Well developed, well  nourished, no acute distress. Head:  normocephalic and male-pattern balding.   Mouth:  pharynx pink and moist and no erythema.   Neck:  No deformities, masses, or tenderness noted. Lungs:  normal respiratory effort and no wheezes.   Heart:  normal rate and regular rhythm.   Abdomen:  hyperactive BS, tender to deep palpation epigastrically, negative murphy sign, no rebound or guarding.  no RUQ or LUQ pain.  no masses appreciated. Pulses:  2+ rad pulses Extremities:  no pedal edema Skin:  Intact without suspicious lesions or rashes   Impression & Recommendations:  Problem # 1:  ABDOMINAL PAIN-MULTIPLE SITES (ICD-789.09) unclear etiology.  s/p cholecystectomy.  check lipase, CMP, CBC to r/o other causes.  If continued, may need EGD to eval ulcer, hiatal hernia (last done 2006), consider trial of different PPI (nexium, dexilant).  Will defer to PCP/GI, rec they see him this week if possible.  For now, restart Gas-x for bloating/belching, stop mobic, bland diet.  Red flags to go to er Discussed.  again discussed foods that predispose to reflux/gastritis.  Doesn't sound cardiac.  No recent weight changes or fevers/chills, no evidence of diverticulitis or obstruction, not acute abdomen.  f/u this week if possible.  His updated medication list for this problem includes:    Adult Aspirin Low Strength 81 Mg Tbdp (Aspirin) ..... Once daily    Meloxicam 7.5 Mg Tabs (Meloxicam) .[name]... Take 1 tablet by mouth once a day  Orders: TLB-CBC Platelet - w/Differential (85025-CBCD) TLB-Hepatic/Liver Function Pnl (80076-HEPATIC) TLB-Lipase (83690-LIPASE) TLB-BMP (Basic Metabolic Panel-BMET) (80048-METABOL)  Complete Medication List: 1)  Nascobal 500 Mcg/0.74ml Nasal Soln (Cyanocobalamin) .... In one nostril every week 2)  Calcium 500 and Vitamim D 300  .[name].. 3 once daily 3)  Aciphex 20 Mg Tbec (Rabeprazole sodium) .... Take 2 capsule by mouth once a day 4)  Zinc Tabs (Zinc tabs) .... Take 1 tablet by mouth  once a day 5)  Zyrtec Allergy 10 Mg Tabs (Cetirizine hcl) .... Once daily 6)  Adult Aspirin Low Strength 81 Mg Tbdp (Aspirin) .... Once daily 7)  Analpram-hc 1-2.5 % Crea (Hydrocortisone ace-pramoxine) .... Apply pr q hs prn 8)  Zocor 20 Mg Tabs (Simvastatin) .... Two times a day 9)  Iron 60 Mg Tabs (iron)  .[name].. 1 once daily 10)  Tussionex Pennkinetic Er 8-10 Mg/9ml Lqcr (Chlorpheniramine-hydrocodone) .[name].. 1 tsp every 12 hours as needed cough as needed 11)  Vitamin C 1000 Mg Tabs (Ascorbic acid) .[name].. 1 once daily 12)  Multivitamins Caps (Multiple vitamin) .[name].. 1 once daily 13)  Amlodipine Besylate 2.5 Mg Tabs (Amlodipine besylate) .... One by mouth daily 14)  Vitamin D3 1000 Unit Caps (Cholecalciferol) .... One by mouth daily 15)  Uroxatral 10 Mg Xr24h-tab (Alfuzosin hcl) .[name].. 1 once daily 16)  Avodart 0.5 Mg Caps (Dutasteride) .[name].. 1 once daily 17)  Meloxicam 7.5 Mg Tabs (Meloxicam) .... Take 1 tablet by mouth once a day  Patient Instructions: 1)  Please  schedule follow up appointment with Dr. Lovell Sheehan or Dr. Marina Goodell about this belly pain this week. 2)  Restart Gas-x. 3)  Push plenty of water, soft foods. 4)  Stop meloxicam until you see Dr. Lovell Sheehan or Marina Goodell. 5)  We will send you to get blood work today. 6)  If any fevers/chills, if worsening abdominal pain despite above measures, if nausea/vomiting or inability to have BM or pass gas, please seek medical care (urgent care or ER). 7)  Good to meet you today, call clinic with questions. I hope you feel better.   Orders Added: 1)  Est. Patient Level III [16109] 2)  TLB-CBC Platelet - w/Differential [85025-CBCD] 3)  TLB-Hepatic/Liver Function Pnl [80076-HEPATIC] 4)  TLB-Lipase [83690-LIPASE] 5)  TLB-BMP (Basic Metabolic Panel-BMET) [80048-METABOL]

## 2010-06-19 NOTE — Progress Notes (Signed)
Summary: avodart & labs  Phone Note Call from Patient Call back at Home Phone [phone]   Caller: vm Summary of Call: 1)Covered for Avodart.  He said he'd write Rx.  If I start that,  Do I continue to take proscar or finesteride & uroxatral?  Continue those or dop one or both?  2)Mail/fax me labs.  Or drop by tomorrow to pickup copy Initial call taken by: Rudy Jew, RN,  March 07, 2010 4:37 PM  Follow-up for Phone Call        may rx avodart, continue the uroxitrol may give labs Follow-up by: Stacie Glaze MD,  March 07, 2010 9:36 PM    New/Updated Medications: AVODART 0.5 MG CAPS (DUTASTERIDE) 1 once daily Prescriptions: AVODART 0.5 MG CAPS (DUTASTERIDE) 1 once daily  #90 x 3   Entered by:   Willy Eddy, LPN   Authorized by:   Stacie Glaze MD   Signed by:   Willy Eddy, LPN on 78/29/5621   Method used:   Print then Give to Patient   RxID:   [id number]   Appended Document: avodart & labs left message on machine for pt

## 2010-06-19 NOTE — Progress Notes (Signed)
Summary: please advise of swelling  Phone Note Call from Patient Call back at Home Phone [phone]   Caller: [facility] mail Reason for Call: Acute Illness Summary of Call: was seen today. had labs done. Around the site if where the blood was drawn, he noticed tenderness and swelling. Please advise. Initial call taken by: Warnell Forester,  March 06, 2010 1:56 PM  Follow-up for Phone Call        pt instructed to put ice on it and let us know if not better Follow-up by: Willy Eddy, LPN,  March 06, 2010 2:06 PM

## 2010-06-19 NOTE — Procedures (Signed)
Summary: Instructions for procedure/[place]  Instructions for procedure/[place]   Imported By: Sherian Rein 04/26/2010 08:20:52  _____________________________________________________________________  External Attachment:    Type:   Image     Comment:   External Document

## 2010-06-19 NOTE — Progress Notes (Signed)
Summary: Triage  Phone Note Call from Patient Call back at Home Phone [phone]   Caller: Patient Call For: Mike Gip Reason for Call: Talk to Nurse Summary of Call: Had extremely sever stomach pain all weekend, went to [facility] urgent care this weekend and they told him he needed to follow up with our office today if possible Initial call taken by: [place] Johnson,  April 23, 2010 8:03 AM  Follow-up for Phone Call        Pt went to [facility] for severe abd pain.  They urged him to follow up with [place] today. Spoke to pt and his pain is eased up some but he still has above and below navel.  He agreed to come today at 2:30 PM . Follow-up by: Joselyn Glassman,  April 23, 2010 9:58 AM

## 2010-06-19 NOTE — Procedures (Signed)
Summary: Upper Endoscopy/[facility]  Upper Endoscopy/[facility]   Imported By: Sherian Rein 03/19/2010 14:18:29  _____________________________________________________________________  External Attachment:    Type:   Image     Comment:   External Document

## 2010-06-19 NOTE — Assessment & Plan Note (Signed)
Summary: Abd pain-   History of Present Illness Visit Type: Follow-up Visit Primary GI MD: [name] MD Primary Provider: Darryll Capers, MD Requesting Provider: na Chief Complaint: upper abd pain  History of Present Illness:   77 year old white male witha history of hypertension, hyperlipidemia, anxiety disorder, depression, mild ITP, and GERD with peptic stricture. He presents today with chief complaints of epigastric pain and bleeding hemorrhoids. First, he reports a three-month history of gnawing epigastric discomfort. The discomfort was most noticeable at night when lying down. No effect from meals. No associated nausea or vomiting. No active GERD symptoms or dysphagia. He did note that he had been taking Mobic 15 mg daily for about one year to address hip pain. He was chronically on AcipHex 20 mg daily for his GERD. About one month ago he discontinued Mobic and increased his AcipHex to 20 mg b.i.d. Since that time, his epigastric discomfort has resolved. He denies lower GI complaints. He has a number of questions. Next, he complains of nontender bleeding hemorrhoids. He has used Analpram periodically. Patient's last colonoscopy was performed in April 2010. This revealed internal hemorrhoids, a diverticulum at the hepatic flexure, and 2 diminutive hyperplastic polyps. Followup in 5 years recommended.   GI Review of Systems    Reports abdominal pain and  acid reflux.     Location of  Abdominal pain: upper abdomen.    Denies belching, bloating, chest pain, dysphagia with liquids, dysphagia with solids, heartburn, loss of appetite, nausea, vomiting, vomiting blood, weight loss, and  weight gain.        Denies anal fissure, black tarry stools, change in bowel habit, constipation, diarrhea, diverticulosis, fecal incontinence, heme positive stool, hemorrhoids, irritable bowel syndrome, jaundice, light color stool, liver problems, rectal bleeding, and  rectal pain.    Current Medications  (verified): 1)  Nascobal 500 Mcg/0.54ml Nasal Soln (Cyanocobalamin) .... in One Nostril Every Week 2)  Calcium 500 and Vitamim D 300 .[name].. 3 Once Daily 3)  Aciphex 20 Mg  Tbec (Rabeprazole Sodium) .... Take 2 Capsule By Mouth Once A Day 4)  Proscar 5 Mg  Tabs (Finasteride) .... Take 1 Tablet By Mouth Once A Day 5)  Zinc   Tabs (Zinc Tabs) .... Take 1 Tablet By Mouth Once A Day 6)  Zyrtec Allergy 10 Mg  Tabs (Cetirizine Hcl) .... Once Daily 7)  Adult Aspirin Low Strength 81 Mg  Tbdp (Aspirin) .... Once Daily 8)  Analpram-Hc 1-2.5 %  Crea (Hydrocortisone Ace-Pramoxine) .... Apply Pr Q Hs Prn 9)  Zocor 20 Mg  Tabs (Simvastatin) .... Two Times A Day 10)  Iron 25 Mg Tabs (Iron) .[name].. 1 Once Daily 11)  Tussionex Pennkinetic Er 8-10 Mg/17ml Lqcr (Chlorpheniramine-Hydrocodone) .[name].. 1 Tsp Every 12 Hours As Needed Cough As Needed 12)  Vitamin C 1000 Mg Tabs (Ascorbic Acid) .[name].. 1 Once Daily 13)  Multivitamins  Caps (Multiple Vitamin) .[name].. 1 Once Daily 14)  Amlodipine Besylate 2.5 Mg Tabs (Amlodipine Besylate) .... One By Mouth Daily 15)  Vitamin D3 1000 Unit Caps (Cholecalciferol) .... One By Mouth Daily  Allergies (verified): No Known Drug Allergies  Past History:  Past Medical History: Reviewed history from 10/13/2009 and no changes required. Elevated PSA GERD Allergies Hyperlipidemia Hypertension abnormal platlet count Hyperplastic Colon Polyps Anxiety Disorder Depression  Past Surgical History: Reviewed history from 03/07/2008 and no changes required. Cholecystectomy Appendectomy Rotator Cuff Repair Tonsillectomy  Family History: Reviewed history from 03/07/2008 and no changes required. No FH of Colon Cancer: Family History  of Heart Disease: Mother  Social History: Reviewed history from 03/07/2008 and no changes required. Retired Married with children Never Smoked Alcohol Use - yes- occasionally  Review of Systems  The patient denies allergy/sinus, anemia, anxiety-new,  arthritis/joint pain, back pain, blood in urine, breast changes/lumps, change in vision, confusion, cough, coughing up blood, depression-new, fainting, fatigue, fever, headaches-new, hearing problems, heart murmur, heart rhythm changes, itching, menstrual pain, muscle pains/cramps, night sweats, nosebleeds, pregnancy symptoms, shortness of breath, skin rash, sleeping problems, sore throat, swelling of feet/legs, swollen lymph glands, thirst - excessive , urination - excessive , urination changes/pain, urine leakage, vision changes, and voice change.    Vital Signs:  Patient profile:   77 year old male Height:      68 inches Weight:      149 pounds BMI:     22.74 BSA:     1.81 Pulse rate:   64 / minute Pulse rhythm:   regular BP sitting:   126 / 58  (left arm) Cuff size:   regular  Vitals Entered By: Ok Anis CMA (October 18, 2009 3:42 PM)  Physical Exam  General:  Well developed, well nourished, no acute distress. Head:  Normocephalic and atraumatic. Eyes:  PERRLA, no icterus. Ears:  Normal auditory acuity. Nose:  No deformity, discharge,  or lesions. Mouth:  No deformity or lesions, dentition normal. Neck:  Supple; no masses or thyromegaly. Lungs:  Clear throughout to auscultation. Heart:  Regular rate and rhythm; no murmurs, rubs,  or bruits. Abdomen:  Soft, nontender and nondistended. No masses, hepatosplenomegaly or hernias noted. Normal bowel sounds. Msk:  Symmetrical with no gross deformities. Normal posture. Pulses:  Normal pulses noted. Extremities:  No clubbing, cyanosis, edema or deformities noted. Neurologic:  Alert and  oriented x4;  grossly normal neurologically. Skin:  Intact without significant lesions or rashes. Cervical Nodes:  No significant cervical or supraclavicular adenopathy. Psych:  Alert and cooperative. Normal mood and affect.   Impression & Recommendations:  Problem # 1:  ABDOMINAL PAIN-EPIGASTRIC (ICD-789.06) The patient presents with a several  month history of epigastric pain. He has had prior upper endoscopy as noted. He is status post cholecystectomy. His symptoms have been answered for one month since increasing his PPI dose and discontinuing the NSAID. I suspect that he had GI upset secondary to the NSAID. We discussed this in detail.  Plan: #1. Reduce AcipHex to 20 mg daily #2. If pain in the abdomen remains absent with lower dose PPI, then introduced acetaminophen or a different NSAID to address hip pain. However, it is the patient's preference to try a lower dose of the same NSAID. We will see what happens. #3. Consider upper endoscopy for recurrent pain her pain refractory to increased dose of PPI. The patient will keep me apprised of this condition as needed  Problem # 2:  ESOPHAGEAL STRICTURE (ICD-530.3) peptic stricture of the distal esophagus which has required esophageal dilation. The patient has remained asymptomatic since his dilation on PPI therapy.  Plan: #1. Continue PPI #2. Esophageal dilation p.r.n. recurrent dysphagia  Problem # 3:  GERD (ICD-530.81) asymptomatic on PPI therapy. He has been on PPIs for many years. We had a long discussion on the possible negative effects of PPI therapy as they appeared in the crest. These include the effects on bone density and magnesium. He tells me that he has had improvement in his blood density. We discussed the pros and cons of PPI therapy. For this patient, with a stricture, as  well as difficult symptoms without meds, it would seem that ongoing treatment with PPI therapy is best.  Problem # 4:  COLONIC POLYPS, HX OF (ICD-V12.72) multiple prior colonoscopies with polyps. No documented history of adenomas. Last colonoscopy demonstrated small hyperplastic lesions. Followup not due for 9 years unless otherwise clinically indicated  Patient Instructions: 1)  Call Dr. Marina Goodell if you have any more trouble 2)  copy: Dr. Darryll Capers  Appended Document: Abd pain- ADDENDUM: Problem  #4: intermittent bleeding internal hemorrhoids.  Plan: #1. Sitz baths next #2. Increase dietary fiber #3. Analpram p.r.n.

## 2010-06-19 NOTE — Progress Notes (Signed)
Summary: work-in request  Phone Note Call from Patient Call back at Home Phone [phone]   Caller: Patient Call For: Dr. Marina Goodell Reason for Call: Talk to Nurse Summary of Call: will not wait until next available REV with Dr. Marina Goodell... wants to be seen asap for gas pain Initial call taken by: Vallarie Mare,  March 12, 2010 9:09 AM  Follow-up for Phone Call        left message on machine to call back  Chales Abrahams CMA Duncan Dull)  March 12, 2010 12:55 PM   pt has heavy, gassy, feeling in the lower abd with nausea.  Better with movement, he is passing gas.  Bowels are normal for the pt.  He is on 2 aciphex daily.  Has not tried gas x. The pain is something he has never had before,  wants to see extender this week.  Pt scheduled for [facility] Follow-up by: Chales Abrahams CMA Duncan Dull),  March 13, 2010 8:22 AM

## 2010-06-21 NOTE — Assessment & Plan Note (Signed)
Summary: F/U ABD Pain, recent Urgent care visit   History of Present Illness Visit Type: Follow-up Visit Primary GI MD: Corey Flemings MD Primary Provider: [name] Requesting Provider: na Chief Complaint: Follow up from having abdominal pain on Saturday  History of Present Illness:   PLEASANT 77 YO MALE KNOWN TO DR. [name]. HE HAS HX OF DIVERTICULOSIS, CONSTIPATION, COLON POLYPS, AND GERD. ALSO WITH ANXIETY, HTN, CHRONIC THROMBOCYTOPENIA.Corey Fischer HE WAS SEEN IN OUR OFFICE  03/16/10 WITH C/O LOWER ABDOMINAL PAIN, GAS  AND CONSTIPATION. HE WAS STARTED ON MIRALAX AND IMPROVED. HE SAYS HIS BOWELS HAVE BEEN DOING FINE. HE HAD ONSET ON FRIDAY 12/2 WITH UPPER ABDOMINAL PAIN WHICH BECAME CONSTANT AND FAIRLY INTENSE. HE DESCRIBES IT AS BEING ACROSS THE UPPER ABDOMEN AND BURNINGAND ACHING IN NATURE. NO NAUSEA, OR VOMITING. NO FEVER. HE HAD BEEN ON MELOXICAM DAILY FOR THE PAST COUPLE WEEKS. HE STAYS ON ACIPHEX 20 MG TWICE DAILY.  HE WAS SEEN IN PRIMARY CARE ON 12/3-HAD LABS DRAWN, WAS TOLD TO STOP THE MELOXICAM, AND FOLLOW A BLAND DIET UNTIL BY GI. HE STARTED FEELING A LITTLE BETTER YESTERDAY AND TODAY, STILL NOT EATING MUCH BUT TAKING FLUIDS. NO ETOH, LOTS OF GAS, BELCHING. REVIEW OF LABS ; CBC 10.8,HGB14.5,PLTS 153. LYTES WNL, LFTS NORMAL, LIPASE 203.   GI Review of Systems    Reports abdominal pain.     Location of  Abdominal pain: generalized.    Denies acid reflux, belching, bloating, chest pain, dysphagia with liquids, dysphagia with solids, heartburn, loss of appetite, nausea, vomiting, vomiting blood, weight loss, and  weight gain.        Denies anal fissure, black tarry stools, change in bowel habit, constipation, diarrhea, diverticulosis, fecal incontinence, heme positive stool, hemorrhoids, irritable bowel syndrome, jaundice, light color stool, liver problems, rectal bleeding, and  rectal pain. Current Medications (verified): 1)  Nascobal 500 Mcg/0.11ml Nasal Soln (Cyanocobalamin) .... in One  Nostril Every Week 2)  Calcium 500 and Vitamim D 300 .Corey Fischer.. 3 Once Daily 3)  Aciphex 20 Mg  Tbec (Rabeprazole Sodium) .... Take 2 Capsule By Mouth Once A Day 4)  Zinc   Tabs (Zinc Tabs) .... Take 1 Tablet By Mouth Once A Day 5)  Zyrtec Allergy 10 Mg  Tabs (Cetirizine Hcl) .... Once Daily 6)  Adult Aspirin Low Strength 81 Mg  Tbdp (Aspirin) .... Once Daily 7)  Analpram-Hc 1-2.5 %  Crea (Hydrocortisone Ace-Pramoxine) .... Apply Pr Q Hs Prn 8)  Zocor 20 Mg  Tabs (Simvastatin) .... Two Times A Day 9)  Iron 60 Mg Tabs (Iron) .Corey Fischer.. 1 Once Daily 10)  Tussionex Pennkinetic Er 8-10 Mg/72ml Lqcr (Chlorpheniramine-Hydrocodone) .Corey Fischer.. 1 Tsp Every 12 Hours As Needed Cough As Needed 11)  Vitamin C 1000 Mg Tabs (Ascorbic Acid) .Corey Fischer.. 1 Once Daily 12)  Multivitamins  Caps (Multiple Vitamin) .Corey Fischer.. 1 Once Daily 13)  Amlodipine Besylate 2.5 Mg Tabs (Amlodipine Besylate) .... One By Mouth Daily 14)  Vitamin D3 1000 Unit Caps (Cholecalciferol) .... One By Mouth Daily 15)  Uroxatral 10 Mg Xr24h-Tab (Alfuzosin Hcl) .Corey Fischer.. 1 Once Daily 16)  Avodart 0.5 Mg Caps (Dutasteride) .Corey Fischer.. 1 Once Daily  Allergies (verified): No Known Drug Allergies  Past History:  Past Medical History: Last updated: 03/16/2010 CHRONIC THROMBOCYTOPENIA COLON POLYPS-HYPERPLASTIC 6/10 MILD DIVERTICULOSIS OSTEOARTHRITIS GERD/ STRICTURE DILATED 2006 Allergies Hyperlipidemia Hypertension ELEVATED PSA Anxiety Disorder Depression  Past Surgical History: Last updated: 03/16/2010 Cholecystectomy Appendectomy Rotator Cuff Repair Tonsillectomy COLONOSCOPY 6/10 ([name]), HYPERPLASTIC POLYPS, MILD DIVERTICULOSIS EGD 2006 ,SMALL HIATAL HERNIA, DISTAL  STRICTURE MALONEY DILATED  Family History: Last updated: 03/07/2008 No FH of Colon Cancer: Family History of Heart Disease: Mother  Social History: Last updated: 03/16/2010 Retired Married with children Never Smoked Alcohol Use - yes- occasionally Illicit Drug Use - no  Review of  Systems  The patient denies allergy/sinus, anemia, anxiety-new, arthritis/joint pain, back pain, blood in urine, breast changes/lumps, change in vision, confusion, cough, coughing up blood, depression-new, fainting, fatigue, fever, headaches-new, hearing problems, heart murmur, heart rhythm changes, itching, menstrual pain, muscle pains/cramps, night sweats, nosebleeds, pregnancy symptoms, shortness of breath, skin rash, sleeping problems, sore throat, swelling of feet/legs, swollen lymph glands, thirst - excessive , urination - excessive , urination changes/pain, urine leakage, vision changes, and voice change.         SEE HPI  Vital Signs:  Patient profile:   77 year old male Height:      68 inches Weight:      147 pounds BMI:     22.43 BSA:     1.79 Pulse rate:   58 / minute Pulse rhythm:   regular BP sitting:   100 / 52  (left arm)  Vitals Entered By: Merri Ray CMA Duncan Dull) (April 23, 2010 2:32 PM)  Physical Exam  General:  Well developed, well nourished, no acute distress. Head:  Normocephalic and atraumatic. Eyes:  PERRLA, no icterus. Neck:  Supple; no masses or thyromegaly. Lungs:  Clear throughout to auscultation. Heart:  Regular rate and rhythm; no murmurs, rubs,  or bruits. Abdomen:  SOFT, MILD TENDERNESS EPIGASTRIUM, NO MASS, NO GUARDING,BS+ Rectal:  NOT DONE Extremities:  No clubbing, cyanosis, edema or deformities noted. Neurologic:  Alert and  oriented x4;  grossly normal neurologically. Psych:  Alert and cooperative. Normal mood and affect.  Impression & Recommendations:  Problem # 1:  ABDOMINAL PAIN-EPIGASTRIC (ICD-789.06) Assessment Improved 77 YO MALE WITH 4 DAY HX OF ACUTE UPPER ABDOMINAL PAIN WITH  LABS CONSISTENT WITH A MILD PANCREATITIS. ETIOLOGY NOT CLEAR. PT IS S/P CHOLECYSTECTOMY. HE IS IMPROVING OVER THE PAST 24 HOURS. ALSO CLINICALLY CONSIDER AN NSAID INDUCED ULCER. SCHEDULE CT SCAN ABDOMEN/PELVIS. STAY OFF NSAIDS. SCHEDULE EGD WITH DR.  [name]-IF CT SHOWS PANCREATITIS OR OTHER CAUSE THEN WILL CANCEL EGD. CONTINUE ACIPHEX 20 MG TWICE DAILY  ADD CARAFATE  I GM  3 TIMES DAILY X ONE WEEK LOW FAT ,BLAND DIET. Orders: ZEGD (ZEGD) CT Abdomen/Pelvis with Contrast (CT Abd/Pelvis w/con)  Problem # 2:  DIVERTICULOSIS-COLON (ICD-562.10) Assessment: Comment Only  Problem # 3:  OSTEOARTHRITIS (ICD-715.9) Assessment: Comment Only  Problem # 4:  CONSTIPATION, CHRONIC (ICD-564.09) Assessment: Comment Only STABLE ON MIRIALAX ONCE DAILY  Problem # 5:  THROMBOCYTHEMIA (ICD-238.71) Assessment: Comment Only  Problem # 6:  HYPERTENSION (ICD-401.9) Assessment: Comment Only  Problem # 7:  HYPERLIPIDEMIA (ICD-272.4) Assessment: Comment Only  Patient Instructions: 1)  We scheduled the Endoscopy with Dr. Marina Goodell for [facility] 04-24-2010. 2)  The location will be [facility].  3)  Directions and brochure  provided.  4)  Continue the Aciphex twice daily . 5)  Stay off all anti-inflammatories.  6)  We sent a prescription for Carafate to [facility] [address] 7)  We scheduled the CT scan for 8)  Copy sent to : Darryll Capers, MD  Prescriptions: CARAFATE 1 GM/10ML SUSP (SUCRALFATE) Take 1 gram between meals  #90 grams x 0   Entered by:   Lowry Ram NCMA   Authorized by:   Sammuel Cooper PA-c  Signed by:   Lowry Ram NCMA on 04/23/2010   Method used:   Electronically to        [facility] [address] [phone]* (retail)       [address]       [place], [place]  [address]       Ph: [phone] or [phone]       Fax: [phone]   RxID:   [id number]

## 2010-06-21 NOTE — Progress Notes (Signed)
Summary: Rx Refill  Phone Note Refill Request Call back at Home Phone [phone] Message from:  Patient on May 28, 2010 11:56 AM  Refills Requested: Medication #1:  Sandria Senter ER 8-10 MG/5ML LQCR 1 tsp every 12 hours as needed cough as needed  Method Requested: Electronic Initial call taken by: Trixie Dredge,  May 28, 2010 11:55 AM    Prescriptions: Sandria Senter ER 8-10 MG/5ML LQCR (CHLORPHENIRAMINE-HYDROCODONE) 1 tsp every 12 hours as needed cough as needed  #6oz x 1   Entered by:   Willy Eddy, LPN   Authorized by:   Stacie Glaze MD   Signed by:   Willy Eddy, LPN on 09/81/1914   Method used:   Telephoned to ...       [facility]  [address] [phone]* (retail)       [address]       [place], [place]  [address]       Ph: [phone] or [phone]       Fax: [phone]   RxID:   [id number]

## 2010-06-22 NOTE — Progress Notes (Signed)
Summary: [facility]   Imported By: Sherian Rein 03/19/2010 14:28:40  _____________________________________________________________________  External Attachment:    Type:   Image     Comment:   External Document

## 2010-06-22 NOTE — Procedures (Signed)
Summary: [employer]   Imported By: Sherian Rein 03/19/2010 14:15:48  _____________________________________________________________________  External Attachment:    Type:   Image     Comment:   External Document

## 2010-06-27 NOTE — Letter (Signed)
Summary: [facility]   Imported By: Maryln Gottron 06/19/2010 10:39:21  _____________________________________________________________________  External Attachment:    Type:   Image     Comment:   External Document

## 2010-07-05 ENCOUNTER — Other Ambulatory Visit (INDEPENDENT_AMBULATORY_CARE_PROVIDER_SITE_OTHER): Payer: Medicare Other | Admitting: Internal Medicine

## 2010-07-05 DIAGNOSIS — D696 Thrombocytopenia, unspecified: Secondary | ICD-10-CM

## 2010-07-05 LAB — CBC WITH DIFFERENTIAL/PLATELET
Basophils Absolute: 0 10*3/uL (ref 0.0–0.1)
Eosinophils Absolute: 0.2 10*3/uL (ref 0.0–0.7)
Eosinophils Relative: 4.1 % (ref 0.0–5.0)
HCT: 41.9 % (ref 39.0–52.0)
Lymphs Abs: 1.9 10*3/uL (ref 0.7–4.0)
MCHC: 34.1 g/dL (ref 30.0–36.0)
MCV: 92.1 fl (ref 78.0–100.0)
Monocytes Absolute: 0.5 10*3/uL (ref 0.1–1.0)
Platelets: 122 10*3/uL — ABNORMAL LOW (ref 150.0–400.0)
RDW: 13.1 % (ref 11.5–14.6)

## 2010-07-13 ENCOUNTER — Ambulatory Visit (INDEPENDENT_AMBULATORY_CARE_PROVIDER_SITE_OTHER): Payer: Medicare Other | Admitting: Internal Medicine

## 2010-07-13 ENCOUNTER — Encounter: Payer: Self-pay | Admitting: Internal Medicine

## 2010-07-13 VITALS — BP 120/70 | HR 64 | Temp 98.1°F | Resp 14 | Ht 67.0 in | Wt 151.0 lb

## 2010-07-13 DIAGNOSIS — N419 Inflammatory disease of prostate, unspecified: Secondary | ICD-10-CM

## 2010-07-13 DIAGNOSIS — K219 Gastro-esophageal reflux disease without esophagitis: Secondary | ICD-10-CM

## 2010-07-13 DIAGNOSIS — D473 Essential (hemorrhagic) thrombocythemia: Secondary | ICD-10-CM

## 2010-07-13 DIAGNOSIS — I10 Essential (primary) hypertension: Secondary | ICD-10-CM

## 2010-07-13 NOTE — Assessment & Plan Note (Signed)
The patient has thrombocythemia we have been following this for several years his platelet count has dropped slightly to the 120s his hemoglobin and hematocrit are normal his white cell count is normal specifically the differential on his white cell count shows no shift I suspect this is a variation of the time and not a significant trend in any direction

## 2010-07-13 NOTE — Assessment & Plan Note (Signed)
The patient has been on Avodart for 3 months he has not noticed any improvement in stream during the day his stream and frequency are normal at night is the primary  We will continue the Avodart and continue to monitor

## 2010-07-13 NOTE — Assessment & Plan Note (Signed)
stable °

## 2010-07-13 NOTE — Progress Notes (Signed)
  Subjective:    Patient ID: Corey Fischer, male    DOB: Dec 10, 1931, 77 y.o.   MRN: [id number]  HPI patient is a 77 year old white male who is followed for thrombocythemia gastroesophageal reflux with a history of Schatzki's ring hypertension hyperlipidemia and a recent evaluation for what was thought to be a pancreatitis.   during that evaluation he had an EGD revealing his persistent Schatzki's ring as well as a CT of the abdomen and pelvis which was normal he had an elevated lipase which suggested pancreatitis as the etiology for his abdominal pain that pain has since resolved.  His thrombocythemia is stable we reviewed his recent laboratory work with him in detail he has a history of hyperlipidemia vitamin B12 deficiency and iron deficiency but the recent laboratory parameters suggest that he does not need to continue the iron supplementation.      Review of Systems  Constitutional: Negative for fever and fatigue.  HENT: Negative for hearing loss, congestion, neck pain and postnasal drip.   Eyes: Negative for discharge, redness and visual disturbance.  Respiratory: Negative for cough, shortness of breath and wheezing.   Cardiovascular: Negative for leg swelling.  Gastrointestinal: Negative for abdominal pain, constipation and abdominal distention.  Genitourinary: Negative for urgency and frequency.  Musculoskeletal: Negative for joint swelling and arthralgias.  Skin: Negative for color change and rash.  Neurological: Negative for weakness and light-headedness.  Hematological: Negative for adenopathy.  Psychiatric/Behavioral: Negative for behavioral problems.       Past Medical History  Diagnosis Date  . Chronic ITP (idiopathic thrombocytopenia)   . Colon polyps   . Diverticulosis   . Arthritis   . GERD (gastroesophageal reflux disease)   . Allergy   . Hyperlipidemia   . Hypertension   . Elevated PSA   . Anxiety   . Depression    Past Surgical History  Procedure Date  .  Cholecystectomy   . Appendectomy   . Rotator cuff repair   . Tonsillectomy     reports that he has never smoked. He does not have any smokeless tobacco history on file. He reports that he drinks alcohol. He reports that he does not use illicit drugs. family history includes Heart disease in his father and mother.     Objective:   Physical Exam  Constitutional: He is oriented to person, place, and time. He appears well-developed and well-nourished.  HENT:  Head: Normocephalic and atraumatic.  Eyes: Conjunctivae are normal. Pupils are equal, round, and reactive to light.  Neck: Normal range of motion. Neck supple.  Cardiovascular: Normal rate and regular rhythm.   Pulmonary/Chest: Effort normal and breath sounds normal.  Abdominal: Soft. Bowel sounds are normal.  Musculoskeletal:        He has persistent tenderness the right groin area immediately medial to the femoral triangle he has a medium-sized right indirect inguinal hernia that is easy to compress  Neurological: He is alert and oriented to person, place, and time.          Assessment & Plan:   we have reviewed with this patient his recent CBC with differential we discussed the fact that his platelet count did increase when he had a recent prednisone course 4 an unrelated cause suggests that he has adequate bone marrow functioning and he states that Dr. Mathis Fare had relayed that he would no longer need to be chronically followed for his thrombocytopenia an oncology

## 2010-07-13 NOTE — Assessment & Plan Note (Signed)
Blood pressure is well controlled 

## 2010-09-11 ENCOUNTER — Other Ambulatory Visit: Payer: Self-pay | Admitting: General Surgery

## 2010-09-11 ENCOUNTER — Ambulatory Visit (HOSPITAL_COMMUNITY)
Admission: RE | Admit: 2010-09-11 | Discharge: 2010-09-11 | Disposition: A | Discharge status: Home / Self Care | Payer: Medicare Other | Source: Ambulatory Visit | Attending: General Surgery | Admitting: General Surgery

## 2010-09-11 ENCOUNTER — Other Ambulatory Visit (HOSPITAL_COMMUNITY): Payer: Self-pay | Admitting: General Surgery

## 2010-09-11 ENCOUNTER — Encounter (HOSPITAL_COMMUNITY): Payer: Medicare Other

## 2010-09-11 DIAGNOSIS — Z01812 Encounter for preprocedural laboratory examination: Secondary | ICD-10-CM | POA: Insufficient documentation

## 2010-09-11 DIAGNOSIS — I4510 Unspecified right bundle-branch block: Secondary | ICD-10-CM | POA: Insufficient documentation

## 2010-09-11 DIAGNOSIS — Z01818 Encounter for other preprocedural examination: Secondary | ICD-10-CM | POA: Insufficient documentation

## 2010-09-11 DIAGNOSIS — IMO0001 Reserved for inherently not codable concepts without codable children: Secondary | ICD-10-CM | POA: Insufficient documentation

## 2010-09-11 DIAGNOSIS — I10 Essential (primary) hypertension: Secondary | ICD-10-CM | POA: Insufficient documentation

## 2010-09-11 DIAGNOSIS — Z01810 Encounter for preprocedural cardiovascular examination: Secondary | ICD-10-CM | POA: Insufficient documentation

## 2010-09-11 LAB — BASIC METABOLIC PANEL
BUN: 20 mg/dL (ref 6–23)
CO2: 29 mEq/L (ref 19–32)
Chloride: 103 mEq/L (ref 96–112)
Glucose, Bld: 98 mg/dL (ref 70–99)
Potassium: 4.4 mEq/L (ref 3.5–5.1)
Sodium: 139 mEq/L (ref 135–145)

## 2010-09-11 LAB — CBC
Platelets: 140 10*3/uL — ABNORMAL LOW (ref 150–400)
RBC: 4.93 MIL/uL (ref 4.22–5.81)
RDW: 12.7 % (ref 11.5–15.5)
WBC: 7.1 10*3/uL (ref 4.0–10.5)

## 2010-09-11 LAB — DIFFERENTIAL
Basophils Absolute: 0 10*3/uL (ref 0.0–0.1)
Basophils Relative: 1 % (ref 0–1)
Eosinophils Absolute: 0.1 10*3/uL (ref 0.0–0.7)
Eosinophils Relative: 1 % (ref 0–5)
Neutrophils Relative %: 71 % (ref 43–77)

## 2010-09-19 ENCOUNTER — Ambulatory Visit (HOSPITAL_COMMUNITY)
Admission: RE | Admit: 2010-09-19 | Discharge: 2010-09-19 | Disposition: A | Discharge status: Home / Self Care | Payer: Medicare Other | Source: Ambulatory Visit | Attending: General Surgery | Admitting: General Surgery

## 2010-09-19 DIAGNOSIS — I4949 Other premature depolarization: Secondary | ICD-10-CM | POA: Insufficient documentation

## 2010-09-19 DIAGNOSIS — I10 Essential (primary) hypertension: Secondary | ICD-10-CM | POA: Insufficient documentation

## 2010-09-19 DIAGNOSIS — K4020 Bilateral inguinal hernia, without obstruction or gangrene, not specified as recurrent: Secondary | ICD-10-CM | POA: Insufficient documentation

## 2010-09-19 DIAGNOSIS — K4090 Unilateral inguinal hernia, without obstruction or gangrene, not specified as recurrent: Secondary | ICD-10-CM | POA: Insufficient documentation

## 2010-09-19 DIAGNOSIS — Z7982 Long term (current) use of aspirin: Secondary | ICD-10-CM | POA: Insufficient documentation

## 2010-09-19 DIAGNOSIS — Z79899 Other long term (current) drug therapy: Secondary | ICD-10-CM | POA: Insufficient documentation

## 2010-09-19 DIAGNOSIS — Z9089 Acquired absence of other organs: Secondary | ICD-10-CM | POA: Insufficient documentation

## 2010-09-19 HISTORY — PX: LAPAROSCOPIC INGUINAL HERNIA REPAIR: SUR788

## 2010-09-24 NOTE — Op Note (Signed)
NAMEZELMA, MAZARIEGO                   ACCOUNT NO.:  [id number]  MEDICAL RECORD NO.:  [id number]           PATIENT TYPE:  O  LOCATION:  DAYL                         FACILITY:  [facility]  PHYSICIAN:  Lennie Muckle, MD      DATE OF BIRTH:  [birth year]  DATE OF PROCEDURE:  09/19/2010 DATE OF DISCHARGE:                              OPERATIVE REPORT   PREOPERATIVE DIAGNOSIS:  Right inguinal hernia.  POSTOPERATIVE DIAGNOSIS:  Bilateral inguinal hernia.  PROCEDURE:  Laparoscopic bilateral inguinal hernia repair with mesh.  SURGEON:  Lennie Muckle, MD  ASSISTANT:  OR staff.  FINDINGS:  Bilateral inguinal hernias with direct hernia defects and direct and indirect on the right.  ESTIMATED BLOOD LOSS:  Minimal amount of blood loss.  ANESTHESIA:  General endotracheal anesthesia.  SPECIMENS:  None.  INDICATIONS FOR PROCEDURE:  Mr. Granja is a 77 year old male who had discomfort in his right inguinal region with exertional activity and had a slight bulge.  Exam did find a right inguinal hernia.  He is very active and exercises quite a bit.  He is somewhat better with resting, but ultimately desired to have laparoscopic repair.  I would look on the other side due to the possibility of having a left inguinal hernia, despite normal exam.  DETAILS OF PROCEDURE:  Mr. Doubleday is identified in the preoperative holding area.  He received 2 g of cefazolin, seen by Anesthesia and taken to the operating room.  Once in the operating room, placed in supine position, his anterior abdomen was clipped.  Foley catheter was positioned.  He then was prepped and draped in usual sterile fashion. Surgical time-out performed.  I began by placing an incision and just beneath the umbilicus, I identified the anterior rectus fascia, incised this with a #11 blade.  Dissecting the preperitoneal space with my finger.  Placed a Spacemaker plus balloon dissector in the preperitoneal space and insufflated this while  visualizing with the camera.  I insufflated approximately 25 times under visualization with camera.  It insufflated without difficulty.  I then retracted cranially and re- insufflated under visualization with camera.  There was slight resistance at the appendectomy scar in the right lower quadrant.  I then removed the Spacemaker plus balloon, insufflated the trocar and attained pneumoperitoneum.  I was able to gain enough space in the preperitoneum to place two 5-mm trocars in the midline under visualization with camera.  I began by dissecting along the right abdominal wall, pushing the peritoneum inferiorly.  It was somewhat adhered together around the appendectomy scar.  I was able to work around the scar, freeing up the abdominal wall laterally just above the anterior-superior iliac spine on the right.  It came down towards the internal ring.  There was a small rent made in the pneumoperitoneum and I have lost some visualization of the preperitoneal space.  I then placed a 5-mm trocar in the left upper quadrant using Optiview.  All layers of abdominal wall were visualized upon entry.  There was a pneumoperitoneum, then partially released gas in order to get better visualization in the preperitoneal  space.  I then went back into the preperitoneal space, continued dissecting around the internal ring.  I was able to free up the hernia site around the internal ring and the vas deferens and spermatic vessels.  I pushed the peritoneum inferiorly.  Care was taken around the iliac vessels.  Once I had this fully cleared, I was able to see a small indirect defect as well as what was felt to be a direct defect as well.  I chose a 6 x 6 piece of Ultrapro mesh and cut this in half.  I then secured it around the internal ring at Cooper's ligament and laterally while palpating the ProTack device.  I then dissected on the left side of the abdominal wall.  Pushing the peritoneum inferiorly, dissected  around the internal ring.  Again, he appeared to have a laxity around the internal ring consistent with the hernia.  I, therefore, placed a second piece of Ultrapro mesh, tacking around the internal ring, Cooper's ligament laterally.  I then held the inferior edge of the mesh while release of pneumoperitoneum.  I attempted to release much of the CO2 in the scrotum.  After releasing all the pneumoperitoneum, I removed the trocars, closed the fascial defect at umbilical region using 0 Vicryl suture, 30 mL of 0.25% Marcaine with epinephrine used as a local anesthetic.  Both testicles were descended at the end of the case.  Skin was closed with 4-0 Monocryl and Dermabond placed on dressing.  The patient was extubated, transferred to postanesthesia care in stable condition and will be discharged home with Percocet.  Follow up with me in 3 weeks' time.     Lennie Muckle, MD     ALA/MEDQ  D:  09/19/2010  T:  09/19/2010  Job:  [id number]  cc:   Stacie Glaze, MD [address] [place] [address]  Electronically Signed by Bertram Savin MD on 09/24/2010 10:49:36 AM

## 2010-10-02 NOTE — Consult Note (Signed)
VASCULAR SURGERY CONSULTATION   Houpt, Jamarco C  DOB:  1931/11/01                                       11/26/2007  ZOXWR#:[id number]   REFERRING PHYSICIAN:  Stacie Glaze, MD   REASON FOR CONSULTATION:  1. Coronary artery disease.  2. Visual disturbance right eye.   HISTORY:  The patient is a 77 year old gentleman who has suffered  several episodes of right eye blurred vision.  These are short lasting,  frequently less than a minute.  He does not note any frank blindness.  Denies a curtain or shade type symptom.  These have always occurred  during the night when awakening from sleeping.   He denies any motor disturbance, sensory disturbance or speech problems.  No dizziness.  No syncope or presyncope.  No gait abnormality.   He has been evaluated by Dr. Luciana Axe who felt that his retinal exam was  normal.   Doppler of the carotids carried out in January of this year reveals  stable carotid disease with 40-59% stenosis bilaterally.  Mild smooth  heterogeneous plaque present bilaterally.   PAST MEDICAL HISTORY:  1. Hypertension.  2. Hyperlipidemia.  3. Elevated PSA.  4. Gastroesophageal reflux disease.   MEDICATIONS:  1. Nascobal 15 mcg weekly.  2. Aciphex 20 mg daily.  3. Proscar 5 mg daily.  4. Simvastatin 40 mg daily.  5. Plavix 75 mg daily.  6. Aspirin 81 mg daily.  7. Cetirizine 10 mg daily.   ALLERGIES:  None.   SOCIAL HISTORY:  The patient is retired from [employer].  He is married  with two grown children.  Never significant tobacco use, no regular  alcohol use.   FAMILY HISTORY:  Mother died age 16 of congestive heart failure.  Father  had a history of coronary artery disease and died in his 67s.   REVIEW OF SYSTEMS:  Refer to patient encounter form, this was reviewed  today.  The patient notes hiatal hernia and reflux.  History of  constipation.  Some occasional dizziness.   PHYSICAL EXAM:  General:  A well-appearing 78 year old male.   Alert and  oriented.  No acute distress.  Vital signs:  BP 139/69 left arm, 142/73  right arm, pulse is 60 per minute and regular, respirations 18 per  minute.  HEENT:  Unremarkable.  Neck:  Supple.  No thyromegaly or  adenopathy.  Cardiovascular:  No carotid bruits.  Normal heart sounds  without murmurs.  Regular rate and rhythm.  No gallops or rubs.  Chest:  Clear, equal air entry bilaterally.  No rales or rhonchi.  Neurologic:  Cranial nerves intact.  Strength equal bilaterally.  1+ reflexes.  Normal gait.  Abdomen:  Soft, nontender.  No masses or organomegaly.  Extremities:  Full range of motion.  No ankle edema.  Skin:  Warm, dry,  intact.  No rash or ulceration.   IMPRESSION:  1. Recurrent right eye visual disturbance.  2. Mild to moderate bilateral carotid disease without significant      stenosis.  3. Hyperlipidemia.  4. Retention.   RECOMMENDATIONS:  Carotid Doppler fails to reveal significant carotid  stenosis, however, with recurrent symptoms of visual disturbance will  plan to perform a CT angiogram of the neck and return to review the  results of this.  Continue current antiplatelet agents.  No indication  for Coumadin.  Balinda Quails, M.D.  Electronically Signed  PGH/MEDQ  D:  11/26/2007  T:  11/27/2007  Job:  [id number]   cc:   Stacie Glaze, MD

## 2010-10-02 NOTE — Assessment & Plan Note (Signed)
OFFICE VISIT   Fischer, Corey C  DOB:  08-19-1931                                       12/10/2007  CHART#:[id number]   The patient returned to the office today to discuss the results of his  CT angiogram.  This reveals moderate amount of plaque in the aortic arch  but no significant great vessel stenosis.  His carotid bifurcation  bilaterally does reveal mild to moderate plaque, however, no significant  ICA stenosis is identified.   With these results medical therapy remains indicated.  He did stop his  Plavix due to a drop in his platelet count and remains on aspirin 81 mg  daily.   I have answered all his questions at this time and medical therapy  remains indicated.   He has asked me about further workup for stroke, I have recommended if  he would really like to pursue this then consideration be given to  referral to a stroke neurologist such as Dr. Janalyn Shy P. Sethi.  He is  otherwise discharged from care at this time.   Balinda Quails, M.D.  Electronically Signed   PGH/MEDQ  D:  12/10/2007  T:  12/11/2007  Job:  [id number]   cc:   Stacie Glaze, MD

## 2010-10-02 NOTE — Assessment & Plan Note (Signed)
[facility]                         GASTROENTEROLOGY OFFICE NOTE   NAME:NEWBYGorman, Safi                          MRN:          [id number]  DATE:10/22/2006                            DOB:          1932/01/04    Pavan comes in with a number of questions.  I tried to answer those for  him about medications, AcipHex, the longevity of taking it.  He takes  good care of himself, exercises, eats right and is very compulsive about  his health, which is wonderful.   PHYSICAL EXAMINATION:  VITAL SIGNS:  He weighed 140.  Blood pressure  128/64, pulse 62 and regular.  NECK, HEART, EXTREMITIES:  All unremarkable.   IMPRESSION:  1. History of gastroesophageal reflux disease.  2. Status post cholecystectomy.  3. History of colon polyps.  4. Anxiety depression.  5. History of prostatism.   RECOMMENDATIONS:  1. Continue the same medicines.  2. He is to follow up with one of my colleagues in the future as per      his discretion.     Ulyess Mort, MD  Electronically Signed    SML/MedQ  DD: 10/22/2006  DT: 10/23/2006  Job #: [id number]

## 2010-10-04 ENCOUNTER — Other Ambulatory Visit: Payer: Self-pay | Admitting: *Deleted

## 2010-10-04 MED ORDER — SIMVASTATIN 20 MG PO TABS: 20.0000 mg | ORAL_TABLET | Freq: Two times a day (BID) | ORAL | Status: DC

## 2010-10-05 NOTE — Procedures (Signed)
NAMEDARRNELL, MANGIARACINA NO.:  [id number]   MEDICAL RECORD NO.:  [id number]          PATIENT TYPE:  OUT   LOCATION:  SLEEP CENTER                 FACILITY:  [facility]   PHYSICIAN:  Marcelyn Bruins, M.D. [facility] DATE OF BIRTH:  1931-10-20   DATE OF STUDY:  12/25/2004                              NOCTURNAL POLYSOMNOGRAM   REFERRING PHYSICIAN:  Dr. Darryll Capers.   INDICATION FOR THE STUDY:  Persistent disorder of initiating and maintaining  wakefulness.   EPWORTH SCORE:  4.   SLEEP ARCHITECTURE:  The patient had A total sleep time of 365 minutes with  adequate slow wave sleep, but decreased REM.  Sleep onset latency was normal  and REM onset was prolonged.  Sleep efficiency was 85%.   IMPRESSION:  1.  Small numbers of obstructive events which do not meet the respiratory      disturbance index criteria for the obstructive sleep apnea syndrome.      The patient did have intermittent mild-to-moderate snoring and large      numbers of nonspecific arousals that may suggest the upper airway      resistant syndrome.  Clinical correlation is suggested.  2.  No clinically significant cardiac arrhythmias.  3.  Small numbers of leg jerks with very little sleep disruption.           ______________________________  Marcelyn Bruins, M.D. [facility]  Diplomate, American Board of Sleep  Medicine     KC/MEDQ  D:  01/02/2005 10:48:14  T:  01/02/2005 11:40:01  Job:  [id number]

## 2010-11-12 ENCOUNTER — Encounter: Payer: Self-pay | Admitting: Internal Medicine

## 2010-11-12 ENCOUNTER — Telehealth: Payer: Self-pay | Admitting: *Deleted

## 2010-11-12 ENCOUNTER — Ambulatory Visit (INDEPENDENT_AMBULATORY_CARE_PROVIDER_SITE_OTHER): Payer: Medicare Other | Admitting: Internal Medicine

## 2010-11-12 ENCOUNTER — Telehealth: Payer: Self-pay | Admitting: Internal Medicine

## 2010-11-12 DIAGNOSIS — I10 Essential (primary) hypertension: Secondary | ICD-10-CM

## 2010-11-12 DIAGNOSIS — R1013 Epigastric pain: Secondary | ICD-10-CM

## 2010-11-12 DIAGNOSIS — K222 Esophageal obstruction: Secondary | ICD-10-CM

## 2010-11-12 NOTE — Progress Notes (Signed)
  Subjective:    Patient ID: Corey Fischer, male    DOB: [birth year], 77 y.o.   MRN: [id number]  HPI  77 year old patient who presents today with chief complaint of epigastric pain. This initially began in December of last year and he was evaluated by GI at that time. This included upper panendoscopy as well as CT abdominal scan. These studies were noncontributory. He has a history of gastroesophageal reflux disease and stricture formation and is on twice a day AcipHex. Carafate was added to his regimen at that time. For the past week he has had recurrent nausea and epigastric pain. There's been no weight loss nausea vomiting or change in his bowel habits. He does have some mild chronic constipation he does have a GI followup in 2 days. He remains on AcipHex twice daily;  he takes no anti-inflammatory medications. He is status post cholecystectomy and is a nondrinker laboratory studies were performed earlier but did not include amylase or lipase. CT scan did not suggest acute pancreatitis   Review of Systems  Constitutional: Negative for fever, chills, appetite change and fatigue.  HENT: Negative for hearing loss, ear pain, congestion, sore throat, trouble swallowing, neck stiffness, dental problem, voice change and tinnitus.   Eyes: Negative for pain, discharge and visual disturbance.  Respiratory: Negative for cough, chest tightness, wheezing and stridor.   Cardiovascular: Negative for chest pain, palpitations and leg swelling.  Gastrointestinal: Positive for abdominal pain. Negative for nausea, vomiting, diarrhea, constipation, blood in stool and abdominal distention.  Genitourinary: Negative for urgency, hematuria, flank pain, discharge, difficulty urinating and genital sores.  Musculoskeletal: Negative for myalgias, back pain, joint swelling, arthralgias and gait problem.  Skin: Negative for rash.  Neurological: Negative for dizziness, syncope, speech difficulty, weakness, numbness and headaches.    Hematological: Negative for adenopathy. Does not bruise/bleed easily.  Psychiatric/Behavioral: Negative for behavioral problems and dysphoric mood. The patient is not nervous/anxious.        Objective:   Physical Exam  Constitutional: He is oriented to person, place, and time. He appears well-developed.  HENT:  Head: Normocephalic.  Right Ear: External ear normal.  Left Ear: External ear normal.  Eyes: Conjunctivae and EOM are normal.  Neck: Normal range of motion.  Cardiovascular: Normal rate and normal heart sounds.   Pulmonary/Chest: Breath sounds normal.  Abdominal: Soft. Bowel sounds are normal. He exhibits no distension. There is no tenderness. There is no rebound and no guarding.       Soft and nontender. No masses. Right upper quadrant and right lower quadrant surgical scar is noted  Musculoskeletal: Normal range of motion. He exhibits no edema and no tenderness.  Neurological: He is alert and oriented to person, place, and time.  Psychiatric: He has a normal mood and affect. His behavior is normal.          Assessment & Plan:   Abdominal pain. Suggested that the patient resume Carafate 4 times daily he is scheduled for GI followup in 2 days. We'll consider followup lab including amylase and lipase. Might consider an empiric trial of pancreatic enzymes if clinical suspicion of low-grade recurrent pancreatitis

## 2010-11-12 NOTE — Telephone Encounter (Signed)
Pt complains of intense epigastric pain off and on x 2 weeks.  Insists on seeing Dr. Lovell Sheehan.  Is taking his Aciphex, and Tums, with no real relief.  Feels like he has epigastric pressure, and no fever. Nausea but no vomiting. Per Dr. Lovell Sheehan, refer to GI today.....Marland KitchenSTAT

## 2010-11-12 NOTE — Patient Instructions (Signed)
Resume Carafate 4 times daily GI followup in 2 days as scheduled  Call or return to clinic prn if these symptoms worsen or fail to improve as anticipated.

## 2010-11-12 NOTE — Telephone Encounter (Signed)
Spoke with Aurther Loft and let her know we did not have an appt available today. Pt scheduled to see Amy Esterwood PA 11/14/10@2pm . Aurther Loft to notify pt of appt date and time.

## 2010-11-12 NOTE — Telephone Encounter (Signed)
Pt left message that he needed to be seen due to stomach problems.  Called him back and left message to call for advice.

## 2010-11-14 ENCOUNTER — Encounter: Payer: Self-pay | Admitting: Physician Assistant

## 2010-11-14 ENCOUNTER — Ambulatory Visit (INDEPENDENT_AMBULATORY_CARE_PROVIDER_SITE_OTHER): Payer: Medicare Other | Admitting: Physician Assistant

## 2010-11-14 ENCOUNTER — Other Ambulatory Visit (INDEPENDENT_AMBULATORY_CARE_PROVIDER_SITE_OTHER): Payer: Medicare Other

## 2010-11-14 VITALS — BP 124/70 | HR 68 | Ht 67.75 in | Wt 150.6 lb

## 2010-11-14 DIAGNOSIS — R1013 Epigastric pain: Secondary | ICD-10-CM

## 2010-11-14 DIAGNOSIS — R1011 Right upper quadrant pain: Secondary | ICD-10-CM

## 2010-11-14 DIAGNOSIS — R110 Nausea: Secondary | ICD-10-CM

## 2010-11-14 DIAGNOSIS — R1084 Generalized abdominal pain: Secondary | ICD-10-CM

## 2010-11-14 LAB — CBC WITH DIFFERENTIAL/PLATELET
Eosinophils Relative: 5 % (ref 0.0–5.0)
HCT: 42.1 % (ref 39.0–52.0)
Hemoglobin: 14.6 g/dL (ref 13.0–17.0)
Lymphs Abs: 1.5 10*3/uL (ref 0.7–4.0)
Monocytes Relative: 7.8 % (ref 3.0–12.0)
Neutro Abs: 3.8 10*3/uL (ref 1.4–7.7)
RDW: 13.4 % (ref 11.5–14.6)
WBC: 6.1 10*3/uL (ref 4.5–10.5)

## 2010-11-14 LAB — COMPREHENSIVE METABOLIC PANEL
Alkaline Phosphatase: 53 U/L (ref 39–117)
Glucose, Bld: 78 mg/dL (ref 70–99)
Sodium: 140 mEq/L (ref 135–145)
Total Bilirubin: 0.4 mg/dL (ref 0.3–1.2)
Total Protein: 6.2 g/dL (ref 6.0–8.3)

## 2010-11-14 LAB — AMYLASE: Amylase: 104 U/L (ref 27–131)

## 2010-11-14 NOTE — Progress Notes (Signed)
Reviewed and agree with management. Jashay Roddy T. Nussen Pullin MD FACG 

## 2010-11-14 NOTE — Patient Instructions (Signed)
Go to basement floor and have your labs drawn today We will call you with results once they are reviewed.

## 2010-11-14 NOTE — Progress Notes (Signed)
Subjective:    Patient ID: Corey Fischer, male    DOB: 1931/11/15, 77 y.o.   MRN: [id number]  HPI Jamall is a 77 year old white male known to Dr. Wendie Agreste with history of chronic GERD, colon polyps, chronic thrombocytopenia, anxiety, chronic constipation and diverticulosis. Last colonoscopy April 2010 with finding of a diverticulum at the hepatic flexure and 2 hyperplastic polyps, as well as internal hemorrhoids. He was last seen last fall with complaints initially of lower abdominal pain and bloating and then in November with epigastric pain. At that time he had been on meloxicam which was stopped and he was already on AcipHex twice daily. Labs showed a mildly elevated lipase at 203. He subsequently underwent CT scan of the abdomen and pelvis which was read as unremarkable with no evidence for pancreatitis no ductal dilation in a patient status post cholecystectomy. Upper endoscopy was then done Dec.  2011 which was negative. Patient says he has done well in the interim and has not had any recurrent problems with abdominal pain until about a week ago. He says last week he had 2 episodes of upper abdominal pain primarily in the epigastrium and right upper quadrant lasting 10-15 hours at a time and usually bothering him all through the night and easing off at some point the following morning. These episodes were not associated with nausea or vomiting no fever no chills. He does have some radiation of pain into his back. He does not notice any change in the pain with by mouth intake no change in his bowel habits but does sometimes feel nauseated. The pain is of fairly intense and he says he has hard time sleeping. He had a more severe episode this past Sunday 4 days ago which he describes as a heavy pressure feeling which started after breakfast on Sunday and then lasted through yesterday and started improving. Again the pain is constant epigastric and right upper quadrant some radiation to his back and described as heavy  18 burning. He has had some vague nausea but no vomiting. He says he has not been on any new medications no regular aspirin or NSAIDs no EtOH. He did have a hernia repair done in May. He says this pain is very similar to the pain that he had back in November and December and have the elevated lipase. He does have some chronic constipation as mentioned above it has been using MiraLax which is effective.   Review of Systems  Constitutional: Negative.   HENT: Negative.   Eyes: Negative.   Respiratory: Negative.   Cardiovascular: Negative.   Gastrointestinal: Positive for nausea, abdominal pain and constipation.  Genitourinary: Negative.   Musculoskeletal: Negative.   Skin: Negative.   Neurological: Negative.   Hematological: Negative.   Psychiatric/Behavioral: Negative.    Outpatient Prescriptions Prior to Visit  Medication Sig Dispense Refill  . alfuzosin (UROXATRAL) 10 MG 24 hr tablet Take 10 mg by mouth daily.        [name] amLODipine (NORVASC) 2.5 MG tablet Take 2.5 mg by mouth daily.        . Ascorbic Acid (VITAMIN C) 1000 MG tablet Take 1,000 mg by mouth daily.        [name] aspirin 81 MG tablet Take 81 mg by mouth daily.        . cetirizine (ZYRTEC) 10 MG tablet Take 10 mg by mouth daily.        . Cholecalciferol (VITAMIN D3) 1000 UNITS CAPS Take 2,000 Units by mouth daily.       [name]  Cyanocobalamin (NASCOBAL) 500 MCG/0.1ML SOLN by Nasal route. 1 spray in nostril every week       . dutasteride (AVODART) 0.5 MG capsule Take 0.5 mg by mouth daily.        . RABEprazole (ACIPHEX) 20 MG tablet Take 20 mg by mouth. 2 daily        . simvastatin (ZOCOR) 20 MG tablet Take 1 tablet (20 mg total) by mouth 2 (two) times daily.  90 tablet  3  . zinc gluconate 50 MG tablet Take 1,000 mg by mouth daily.            Objective:   Physical Exam Well-developed elderly white male in no acute distress, pleasant, alert and oriented x3  HEENT; nontraumatic normocephalic EOMI PERRLA sclera anicteric  Neck; Supple  no JVD  Cardiovascular; regular rate and rhythm with S1-S2 no murmur or gallop  Pulmonary; clear bilaterally  Abdomen; soft nondistended bowel sounds active, no palpable mass or hepatosplenomegaly, minimally tender right upper quadrant and epigastrium  Recta;l not done  Skin; warm and dry benign no edema  Psych ; mood and affect normal and appropriate        Assessment & Plan:  #15 77 year old male with recurrent epigastric and right upper quadrant abdominal pain somewhat episodic with most recent episode onset 4 days ago and lasting 3 days. Etiology is not clear however patient did have a documented elevated lipase with previous episode of December 2011. Rule out recurrent mild pancreatitis, patient also with a diverticulum at the hepatic flexure of unclear significance consider recurrent diverticulitis.  Plan; Continue AcipHex twice daily Advise Tylenol extra strength Tylenol on a when necessary basis Check CBC ,CMET,amylas,e lipase and sedimentation rate today. Further decisions regarding abdominal imaging will be made after reviewing his labs. He may need repeat CT abdomen and pelvis  #2 Chronic GERD, stable on Continue AcipHex twice daily  #3 Chronic constipation Continue MiraLax once daily  #4 History of colon polyps- last colonoscopy 2010, hyperplastic polyps.

## 2010-11-16 ENCOUNTER — Telehealth: Payer: Self-pay | Admitting: Physician Assistant

## 2010-11-19 ENCOUNTER — Telehealth: Payer: Self-pay

## 2010-11-19 DIAGNOSIS — K859 Acute pancreatitis without necrosis or infection, unspecified: Secondary | ICD-10-CM

## 2010-11-19 NOTE — Telephone Encounter (Signed)
See 11-19-2010 Phone note per Dr. Christella Hartigan and Chales Abrahams CMA.

## 2010-11-19 NOTE — Telephone Encounter (Addendum)
Pt scheduled for EUS 12/20/10 830 am pt needs instructed as well as meds reviewed.  Left message on machine to call back

## 2010-11-19 NOTE — Telephone Encounter (Signed)
Pt has been instructed and meds reviewed pt will call back with any further questions.

## 2010-11-30 ENCOUNTER — Other Ambulatory Visit: Payer: Self-pay | Admitting: *Deleted

## 2010-11-30 MED ORDER — SIMVASTATIN 20 MG PO TABS: 20.0000 mg | ORAL_TABLET | Freq: Two times a day (BID) | ORAL | Status: DC

## 2010-12-20 ENCOUNTER — Ambulatory Visit (HOSPITAL_COMMUNITY)
Admission: RE | Admit: 2010-12-20 | Discharge: 2010-12-20 | Disposition: A | Discharge status: Home / Self Care | Payer: Medicare Other | Source: Ambulatory Visit | Attending: Gastroenterology | Admitting: Gastroenterology

## 2010-12-20 ENCOUNTER — Encounter: Payer: Medicare Other | Admitting: Gastroenterology

## 2010-12-20 DIAGNOSIS — R1011 Right upper quadrant pain: Secondary | ICD-10-CM

## 2010-12-20 DIAGNOSIS — R109 Unspecified abdominal pain: Secondary | ICD-10-CM | POA: Insufficient documentation

## 2010-12-20 DIAGNOSIS — R748 Abnormal levels of other serum enzymes: Secondary | ICD-10-CM | POA: Insufficient documentation

## 2010-12-21 ENCOUNTER — Telehealth: Payer: Self-pay | Admitting: Internal Medicine

## 2010-12-21 NOTE — Telephone Encounter (Signed)
Please call patient with EUS results.  Thanks.

## 2010-12-24 NOTE — Telephone Encounter (Signed)
Pt aware. Pt states that he did have the same symptoms and pain after the procedure on Friday. Pt wants to know if there is anything else he needs to do or does he just follow-up in . Pt states he is ok right now. Please advise.

## 2010-12-24 NOTE — Telephone Encounter (Signed)
Pt calling requesting EUS results. Please advise.

## 2010-12-24 NOTE — Telephone Encounter (Signed)
Pt aware.

## 2010-12-24 NOTE — Telephone Encounter (Signed)
I'm sorry, I found that Dr. Christella Hartigan review the results with him and his caregiver on the day of the exam. In any event, I am pleased to report that the examination was entirely normal. Nothing further to be done at this point. He should see me for routine followup in about 3 months.

## 2010-12-24 NOTE — Telephone Encounter (Signed)
As I said, nothing further needs to be done. Routine followup in 3 Months please

## 2011-01-11 ENCOUNTER — Other Ambulatory Visit (INDEPENDENT_AMBULATORY_CARE_PROVIDER_SITE_OTHER): Payer: Medicare Other

## 2011-01-11 DIAGNOSIS — D518 Other vitamin B12 deficiency anemias: Secondary | ICD-10-CM

## 2011-01-11 DIAGNOSIS — D519 Vitamin B12 deficiency anemia, unspecified: Secondary | ICD-10-CM

## 2011-01-11 LAB — CBC WITH DIFFERENTIAL/PLATELET
Basophils Absolute: 0 10*3/uL (ref 0.0–0.1)
Basophils Relative: 0.8 % (ref 0.0–3.0)
Eosinophils Relative: 2.5 % (ref 0.0–5.0)
Hemoglobin: 14.9 g/dL (ref 13.0–17.0)
Lymphocytes Relative: 26 % (ref 12.0–46.0)
Monocytes Relative: 9.2 % (ref 3.0–12.0)
Neutro Abs: 3.7 10*3/uL (ref 1.4–7.7)
RBC: 4.83 Mil/uL (ref 4.22–5.81)
RDW: 13.7 % (ref 11.5–14.6)
WBC: 6 10*3/uL (ref 4.5–10.5)

## 2011-01-11 LAB — VITAMIN B12: Vitamin B-12: 502 pg/mL (ref 211–911)

## 2011-01-18 ENCOUNTER — Encounter: Payer: Self-pay | Admitting: Internal Medicine

## 2011-01-18 ENCOUNTER — Ambulatory Visit (INDEPENDENT_AMBULATORY_CARE_PROVIDER_SITE_OTHER): Payer: Medicare Other | Admitting: Internal Medicine

## 2011-01-18 DIAGNOSIS — K219 Gastro-esophageal reflux disease without esophagitis: Secondary | ICD-10-CM

## 2011-01-18 DIAGNOSIS — D473 Essential (hemorrhagic) thrombocythemia: Secondary | ICD-10-CM

## 2011-01-18 DIAGNOSIS — E538 Deficiency of other specified B group vitamins: Secondary | ICD-10-CM

## 2011-01-18 DIAGNOSIS — R1013 Epigastric pain: Secondary | ICD-10-CM

## 2011-01-18 DIAGNOSIS — K5909 Other constipation: Secondary | ICD-10-CM

## 2011-01-18 DIAGNOSIS — K5900 Constipation, unspecified: Secondary | ICD-10-CM

## 2011-01-18 NOTE — Progress Notes (Signed)
  Subjective:    Patient ID: Corey Fischer, male    DOB: 1931/10/16, 77 y.o.   MRN: [id number]  HPI episodic  Epigastric pain Normal endoscopy and EUS some transient abdominal pain and lipase elevated transiently Hx of bilateral hernia repair Taking miraalx every day Plane films showed stool  and gas Patient has comorbid problems of thrombocythemia we have monitored his platelets and did not dip below 100.  As well as chronic gastroesophageal reflux.  He has no reports of melena bright red blood per rectum or vomiting blood.       Review of Systems  Constitutional: Negative for fever and fatigue.  HENT: Negative for hearing loss, congestion, neck pain and postnasal drip.   Eyes: Negative for discharge, redness and visual disturbance.  Respiratory: Negative for cough, shortness of breath and wheezing.   Cardiovascular: Negative for leg swelling.  Gastrointestinal: Negative for abdominal pain, constipation and abdominal distention.  Genitourinary: Negative for urgency and frequency.  Musculoskeletal: Negative for joint swelling and arthralgias.  Skin: Negative for color change and rash.  Neurological: Negative for weakness and light-headedness.  Hematological: Negative for adenopathy.  Psychiatric/Behavioral: Negative for behavioral problems.   Past Medical History  Diagnosis Date  . Chronic ITP (idiopathic thrombocytopenia)   . Colon polyps   . Diverticulosis   . Arthritis   . GERD (gastroesophageal reflux disease)   . Allergy   . Hyperlipidemia   . Hypertension   . Elevated PSA   . Anxiety   . Depression   . Stricture and stenosis of esophagus 2006    EGD  . Gastritis   . Hemorrhoids   . History of hiatal hernia   . Inguinal hernia   . Raynaud's syndrome    Past Surgical History  Procedure Date  . Cholecystectomy   . Appendectomy   . Rotator cuff repair   . Tonsillectomy   . Inguinal hernia repair     rt.  09/2010    reports that he has never smoked. He does  not have any smokeless tobacco history on file. He reports that he drinks alcohol. He reports that he does not use illicit drugs. family history includes Heart disease in his father and mother.  There is no history of Colon cancer. No Known Allergies     Objective:   Physical Exam  Nursing note and vitals reviewed. Constitutional: He appears well-developed and well-nourished.  HENT:  Head: Normocephalic and atraumatic.  Eyes: Conjunctivae are normal. Pupils are equal, round, and reactive to light.  Neck: Normal range of motion. Neck supple.  Cardiovascular: Normal rate and regular rhythm.   Pulmonary/Chest: Effort normal and breath sounds normal.  Abdominal: Soft. Bowel sounds are normal.          Assessment & Plan:  Stop the miralax switching to prunes and fiber Use of senkot recommended for constipation  Monitoring of his platelets shows him to be stable.  Discussion of his vitamin B12 deficiency he has not been able to get enough of the nasal vitamin B12 spray to continue to use it on a weekly basis and we will need to adjust his prescription so that in a month with 5 weeks he continues to have the medication appropriately dosed.  To make up from this dose we'll give a vitamin B12 shot today and adjust his prescription.  Prior to his vitamin B12 shot of B12 level has been monitored. His GERD is currently controlled.

## 2011-02-05 ENCOUNTER — Other Ambulatory Visit: Payer: Self-pay | Admitting: Internal Medicine

## 2011-03-18 ENCOUNTER — Other Ambulatory Visit: Payer: Self-pay | Admitting: *Deleted

## 2011-03-18 MED ORDER — DUTASTERIDE 0.5 MG PO CAPS: 0.5000 mg | ORAL_CAPSULE | Freq: Every day | ORAL | Status: DC

## 2011-03-19 ENCOUNTER — Other Ambulatory Visit: Payer: Self-pay | Admitting: *Deleted

## 2011-03-19 MED ORDER — RABEPRAZOLE SODIUM 20 MG PO TBEC: 20.0000 mg | DELAYED_RELEASE_TABLET | Freq: Two times a day (BID) | ORAL | Status: DC

## 2011-03-19 MED ORDER — DUTASTERIDE 0.5 MG PO CAPS: 0.5000 mg | ORAL_CAPSULE | Freq: Every day | ORAL | Status: DC

## 2011-03-25 ENCOUNTER — Ambulatory Visit: Payer: Medicare Other | Admitting: Internal Medicine

## 2011-03-25 ENCOUNTER — Other Ambulatory Visit (INDEPENDENT_AMBULATORY_CARE_PROVIDER_SITE_OTHER): Payer: Medicare Other

## 2011-03-25 ENCOUNTER — Telehealth: Payer: Self-pay | Admitting: Internal Medicine

## 2011-03-25 DIAGNOSIS — R109 Unspecified abdominal pain: Secondary | ICD-10-CM

## 2011-03-25 LAB — COMPREHENSIVE METABOLIC PANEL
ALT: 23 U/L (ref 0–53)
BUN: 16 mg/dL (ref 6–23)
CO2: 28 mEq/L (ref 19–32)
Creatinine, Ser: 1 mg/dL (ref 0.4–1.5)
GFR: 73.57 mL/min (ref >60.00)
Total Bilirubin: 0.6 mg/dL (ref 0.3–1.2)

## 2011-03-25 LAB — LIPASE: Lipase: 43 U/L (ref 11.0–59.0)

## 2011-03-25 LAB — AMYLASE: Amylase: 95 U/L (ref 27–131)

## 2011-03-25 NOTE — Telephone Encounter (Signed)
Pt last seen 11/14/10 by Mike Gip PA for abdominal pain, possible pancreatitis. Pt states Amy mentioned that if the pain came back we should probably draw labs. States he has been hurting for a couple of days. Pt had EUS done 12/20/10. Dr. Marina Goodell please advise.

## 2011-03-25 NOTE — Telephone Encounter (Signed)
Pt aware, he plans to come for labs today.

## 2011-03-25 NOTE — Telephone Encounter (Signed)
Okay to obtain comprehensive metabolic panel, amylase, and lipase. However, her mind him that his endoscopic ultrasound showed an entirely normal pancreas

## 2011-03-26 ENCOUNTER — Telehealth: Payer: Self-pay

## 2011-03-26 NOTE — Telephone Encounter (Signed)
Pt aware.

## 2011-03-26 NOTE — Telephone Encounter (Signed)
Message copied by Michele Mcalpine on Tue Mar 26, 2011  9:18 AM ------      Message from: Hilarie Fredrickson      Created: Mon Mar 25, 2011  6:03 PM       Please let  patient know his blood work is normal

## 2011-04-04 ENCOUNTER — Encounter: Payer: Self-pay | Admitting: Internal Medicine

## 2011-04-04 ENCOUNTER — Ambulatory Visit (INDEPENDENT_AMBULATORY_CARE_PROVIDER_SITE_OTHER): Payer: Medicare Other | Admitting: Internal Medicine

## 2011-04-04 VITALS — BP 122/68 | HR 78 | Ht 67.0 in | Wt 148.8 lb

## 2011-04-04 DIAGNOSIS — Z860100 Personal history of colon polyps, unspecified: Secondary | ICD-10-CM

## 2011-04-04 DIAGNOSIS — R1084 Generalized abdominal pain: Secondary | ICD-10-CM

## 2011-04-04 DIAGNOSIS — K5900 Constipation, unspecified: Secondary | ICD-10-CM

## 2011-04-04 DIAGNOSIS — K219 Gastro-esophageal reflux disease without esophagitis: Secondary | ICD-10-CM

## 2011-04-04 MED ORDER — DICYCLOMINE HCL 10 MG PO CAPS: ORAL_CAPSULE | ORAL | Status: DC

## 2011-04-04 NOTE — Patient Instructions (Addendum)
We have sent the following medications to your pharmacy for you to pick up at your convenience:  Bentyl   Of note: rx sent to [facility] in error has been d/c'ed by Raiford Noble at [facility]. Ronny Bacon, CMA 2:19 pm 04/04/11

## 2011-04-04 NOTE — Progress Notes (Signed)
HISTORY OF PRESENT ILLNESS:  Corey Fischer is a 77 y.o. male with a history of hypertension, hyperlipidemia, anxiety disorder, depression, mild ITP, GERD complicated by peptic stricture, and chronic recurrent abdominal pain without specific cause. He presents today regarding recent recurrent problems with abdominal pain. He has had problems with abdominal pain intermittently since at least 2006. The characteristics are unchanged. Specifically, he reports upper abdominal discomfort, mostly epigastric, that occurs fairly constantly, though waxing and waning, for about 3 days. He does not identify factors that either exacerbated or relieved his discomfort. No effect by meals or stress. No effect by bowel movements formed. Medical therapies such as PPI. He does stay on low dose PPI for GERD. I saw the patient in June of 2011 for the same. His last colonoscopy was performed in April of 2010 and revealed an isolated diverticulum, diminutive polyps, and hemorrhoids. His last upper endoscopy was performed in December of 2011 and was normal except for an incidental esophageal stricture and benign pedunculated polyp in the gastric body. Around that time, he did have a minimally elevated lipase for which a CT scan of the abdomen and pelvis with contrast was performed. This was normal, including the pancreas. He was again seen in June 2012. Because of recurrent problems and a previously minimally elevated lipase, and endoscopic ultrasound, with strict attention to the pancreas, was performed 12/20/2010. This was entirely normal. He contacted the office 10 days ago complaining of pain. He requested laboratories including pancreatic enzymes. These were obtained during a bout of discomfort. They return to normal. He has had no problems for about one week. He is accompanied today by his wife. He brings with him, personal notes from 2006, describing the exact same symptom complex. He also complains of constipation. He states that  this has worsened since around age 19. He has tried senna and prune juice with variable results. No bleeding. No weight loss. Continues to exercise.  REVIEW OF SYSTEMS:  All non-GI ROS negative except for arthritis.  Past Medical History  Diagnosis Date  . Chronic ITP (idiopathic thrombocytopenia)   . Hx of adenomatous colonic polyps 2010  . Diverticulosis   . Arthritis   . GERD (gastroesophageal reflux disease)   . Allergy   . Hyperlipidemia   . Hypertension   . Elevated PSA   . Anxiety   . Depression   . Stricture and stenosis of esophagus   . Gastritis   . Hemorrhoids   . History of hiatal hernia   . Inguinal hernia   . Raynaud's syndrome   . Osteoarthritis   . Gastric polyp     Past Surgical History  Procedure Date  . Cholecystectomy   . Appendectomy   . Rotator cuff repair   . Tonsillectomy   . Inguinal hernia repair     rt.  09/2010    Social History Corey Fischer  reports that he has never smoked. He has never used smokeless tobacco. He reports that he drinks alcohol. He reports that he does not use illicit drugs.  family history includes Colon polyps in his mother and Heart disease in his father and mother.  There is no history of Colon cancer.  No Known Allergies     PHYSICAL EXAMINATION: Vital signs: BP 122/68  Pulse 78  Ht 5\' 7"  (1.702 m)  Wt 148 lb 12.8 oz (67.495 kg)  BMI 23.31 kg/m2  SpO2 98% General: Well-developed, well-nourished, no acute distress HEENT: Sclerae are anicteric, conjunctiva pink. Oral mucosa  intact Lungs: Clear Heart: Regular Abdomen: soft, nontender, nondistended, no obvious ascites, no peritoneal signs, normal bowel sounds. No organomegaly. Extremities: No edema Psychiatric: alert and oriented x3. Cooperative    ASSESSMENT:  #1. Recurrent upper abdominal discomfort without specific cause. Problem has been going on for at least 6 years. Extensive negative workups. Suspect functional cause, possibly spasm. Recent  laboratory evaluation during a bout of pain, normal. #2. Functional constipation #3. GERD. No classic symptoms on PPI #4. History of colon polyps. Last colonoscopy April 2010   PLAN:  #1. Continue PPI. Lowest dose to control classic GERD symptoms #2. Prescribed Bentyl 10 mg, #60, 1-2 by mouth every 4-6 hours when necessary pain #3. Functional constipation. Worsening. Recommended MiraLax. Titrate to need. #4. Surveillance colonoscopy around April 2015 #5. GI followup as needed

## 2011-04-29 ENCOUNTER — Encounter: Payer: Self-pay | Admitting: Internal Medicine

## 2011-04-29 ENCOUNTER — Ambulatory Visit (INDEPENDENT_AMBULATORY_CARE_PROVIDER_SITE_OTHER): Payer: Medicare Other | Admitting: Internal Medicine

## 2011-04-29 VITALS — BP 134/72 | HR 60 | Temp 98.2°F | Resp 16 | Ht 68.0 in | Wt 144.0 lb

## 2011-04-29 DIAGNOSIS — E538 Deficiency of other specified B group vitamins: Secondary | ICD-10-CM

## 2011-04-29 DIAGNOSIS — N400 Enlarged prostate without lower urinary tract symptoms: Secondary | ICD-10-CM

## 2011-04-29 DIAGNOSIS — Z125 Encounter for screening for malignant neoplasm of prostate: Secondary | ICD-10-CM

## 2011-04-29 DIAGNOSIS — R972 Elevated prostate specific antigen [PSA]: Secondary | ICD-10-CM

## 2011-04-29 DIAGNOSIS — E785 Hyperlipidemia, unspecified: Secondary | ICD-10-CM

## 2011-04-29 DIAGNOSIS — D473 Essential (hemorrhagic) thrombocythemia: Secondary | ICD-10-CM

## 2011-04-29 DIAGNOSIS — T887XXA Unspecified adverse effect of drug or medicament, initial encounter: Secondary | ICD-10-CM

## 2011-04-29 DIAGNOSIS — D75839 Thrombocytosis, unspecified: Secondary | ICD-10-CM

## 2011-04-29 DIAGNOSIS — I10 Essential (primary) hypertension: Secondary | ICD-10-CM

## 2011-04-29 DIAGNOSIS — Z0000 Encounter for general adult medical examination without abnormal findings: Secondary | ICD-10-CM

## 2011-04-29 LAB — LIPID PANEL
HDL: 49.4 mg/dL (ref >39.00)
LDL Cholesterol: 72 mg/dL (ref 0–99)
VLDL: 26.4 mg/dL (ref 0.0–40.0)

## 2011-04-29 LAB — CBC WITH DIFFERENTIAL/PLATELET
Basophils Relative: 0.5 % (ref 0.0–3.0)
Eosinophils Absolute: 0 10*3/uL (ref 0.0–0.7)
Eosinophils Relative: 0.5 % (ref 0.0–5.0)
Lymphocytes Relative: 26.7 % (ref 12.0–46.0)
Monocytes Absolute: 0.5 10*3/uL (ref 0.1–1.0)
Neutrophils Relative %: 65 % (ref 43.0–77.0)
Platelets: 133 10*3/uL — ABNORMAL LOW (ref 150.0–400.0)
RBC: 4.88 Mil/uL (ref 4.22–5.81)
WBC: 6.5 10*3/uL (ref 4.5–10.5)

## 2011-04-29 LAB — BASIC METABOLIC PANEL
Calcium: 9.8 mg/dL (ref 8.4–10.5)
GFR: 60.61 mL/min (ref >60.00)
Potassium: 5 mEq/L (ref 3.5–5.1)
Sodium: 142 mEq/L (ref 135–145)

## 2011-04-29 LAB — HEPATIC FUNCTION PANEL
AST: 26 U/L (ref 0–37)
Alkaline Phosphatase: 51 U/L (ref 39–117)
Bilirubin, Direct: 0.1 mg/dL (ref 0.0–0.3)
Total Bilirubin: 0.8 mg/dL (ref 0.3–1.2)

## 2011-04-29 LAB — POCT URINALYSIS DIPSTICK
Bilirubin, UA: NEGATIVE
Blood, UA: NEGATIVE
Glucose, UA: NEGATIVE
Leukocytes, UA: NEGATIVE
Nitrite, UA: NEGATIVE
Urobilinogen, UA: 0.2
pH, UA: 8.5

## 2011-04-29 LAB — TSH: TSH: 0.79 u[IU]/mL (ref 0.35–5.50)

## 2011-04-29 LAB — PSA: PSA: 1.43 ng/mL (ref 0.10–4.00)

## 2011-04-29 NOTE — Patient Instructions (Signed)
The patient is instructed to continue all medications as prescribed. Schedule followup with check out clerk upon leaving the clinic  

## 2011-04-29 NOTE — Progress Notes (Signed)
Subjective:    Patient ID: Corey Fischer, male    DOB: October 28, 1931, 77 y.o.   MRN: [id number]  HPI  Medicare CPX Is as per Medicare wellness check as well as followup for hypertension hyperlipidemia and gastroesophageal reflux she also is followed periodically for monitoring platelet count for history of thrombocythemia he has had stable platelets the last few visits the history of vitamin B12 deficiency as well as vitamin B12 level should be monitored lipid panel a liver panel and a basic metabolic panel will also be checked as well as a CBC .  Review of Systems  Constitutional: Negative for fever and fatigue.  HENT: Negative for hearing loss, congestion, neck pain and postnasal drip.   Eyes: Negative for discharge, redness and visual disturbance.  Respiratory: Negative for cough, shortness of breath and wheezing.   Cardiovascular: Negative for leg swelling.  Gastrointestinal: Negative for abdominal pain, constipation and abdominal distention.  Genitourinary: Negative for urgency and frequency.  Musculoskeletal: Negative for joint swelling and arthralgias.  Skin: Negative for color change and rash.  Neurological: Negative for weakness and light-headedness.  Hematological: Negative for adenopathy.  Psychiatric/Behavioral: Negative for behavioral problems.   Past Medical History  Diagnosis Date  . Chronic ITP (idiopathic thrombocytopenia)   . Hx of adenomatous colonic polyps 2010  . Diverticulosis   . Arthritis   . GERD (gastroesophageal reflux disease)   . Allergy   . Hyperlipidemia   . Hypertension   . Elevated PSA   . Anxiety   . Depression   . Stricture and stenosis of esophagus   . Gastritis   . Hemorrhoids   . History of hiatal hernia   . Inguinal hernia   . Raynaud's syndrome   . Osteoarthritis   . Gastric polyp     History   Social History  . Marital Status: Married    Spouse Name: N/A    Number of Children: 2  . Years of Education: N/A   Occupational History   . retired    Social History Main Topics  . Smoking status: Never Smoker   . Smokeless tobacco: Never Used  . Alcohol Use: Yes     occasionally  . Drug Use: No  . Sexually Active: Yes   Other Topics Concern  . Not on file   Social History Narrative  . No narrative on file    Past Surgical History  Procedure Date  . Cholecystectomy   . Appendectomy   . Rotator cuff repair   . Tonsillectomy   . Inguinal hernia repair     rt.  09/2010    Family History  Problem Relation Age of Onset  . Heart disease Mother   . Heart disease Father   . Colon cancer Neg Hx   . Colon polyps Mother     No Known Allergies  Current Outpatient Prescriptions on File Prior to Visit  Medication Sig Dispense Refill  . alfuzosin (UROXATRAL) 10 MG 24 hr tablet Take 10 mg by mouth daily.        [name] amLODipine (NORVASC) 2.5 MG tablet TAKE 1 TABLET DAILY  90 tablet  3  . Ascorbic Acid (VITAMIN C) 1000 MG tablet Take 1,000 mg by mouth daily.        [name] aspirin 81 MG tablet Take 81 mg by mouth daily.        . cetirizine (ZYRTEC) 10 MG tablet Take 10 mg by mouth daily.        . Cholecalciferol (VITAMIN  D3) 1000 UNITS CAPS Take 2,000 Units by mouth daily.       . Coral Calcium 1000 (390 CA) MG TABS Take by mouth.        . Cyanocobalamin (NASCOBAL) 500 MCG/0.1ML SOLN by Nasal route. 1 spray in nostril every week       . dicyclomine (BENTYL) 10 MG capsule Take 1 -2 capsules every 4 to 6 hours as needed for pain  60 capsule  3  . dutasteride (AVODART) 0.5 MG capsule Take 1 capsule (0.5 mg total) by mouth daily.  90 capsule  3  . NON FORMULARY Zinc 20 mg daily       . RABEprazole (ACIPHEX) 20 MG tablet Take 1 tablet (20 mg total) by mouth 2 (two) times daily. 2 daily   180 tablet  3  . simvastatin (ZOCOR) 20 MG tablet Take 1 tablet (20 mg total) by mouth 2 (two) times daily.  90 tablet  3    BP 134/72  Pulse 60  Temp 98.2 F (36.8 C)  Resp 16  Ht 5\' 8"  (1.727 m)  Wt 144 lb (65.318 kg)  BMI 21.90  kg/m2       Objective:   Physical Exam  Constitutional: He appears well-developed and well-nourished.  HENT:  Head: Normocephalic and atraumatic.  Eyes: Conjunctivae are normal. Pupils are equal, round, and reactive to light.  Neck: Normal range of motion. Neck supple.  Cardiovascular: Normal rate and regular rhythm.   Pulmonary/Chest: Effort normal and breath sounds normal.  Abdominal: Soft. Bowel sounds are normal.    60 gram prostate      Assessment & Plan:  CBC with differential to monitor platelet count and notify the patient of this platelet count after we receive those values also measure a vitamin B12 level today.  Basic metabolic panel to look at renal function patient will receive  Patient will receive a CBC differential and a B12 level to monitor B12 efficiency a history of thrombocythemia.  Patient will have a lipid and liver to monitor hyperlipidemia and check a medicine side effect.   Patient will receive a basic metabolic panel to look at his hypertensive control creatinine BUN and potassium levels  Patient was also seen for a Medicare wellness survey  Subjective:    Corey Fischer is a 77 y.o. male who presents for Medicare Annual/Subsequent preventive examination.   Preventive Screening-Counseling & Management  Tobacco History  Smoking status  . Never Smoker   Smokeless tobacco  . Never Used    Problems Prior to Visit 1.   Current Problems (verified) Patient Active Problem List  Diagnoses  . THROMBOCYTHEMIA  . VITAMIN B12 DEFICIENCY  . HYPERLIPIDEMIA  . UNSPECIFIED VISUAL LOSS  . HYPERTENSION  . CAROTID ARTERY STENOSIS, BILATERAL  . RAYNAUD'S SYNDROME  . INTERNAL HEMORRHOIDS WITHOUT MENTION COMP  . ESOPHAGEAL STRICTURE  . GERD  . INGUINAL HERNIA, RIGHT  . DIVERTICULOSIS-COLON  . CONSTIPATION  . CONSTIPATION, CHRONIC  . PROSTATITIS, RECURRENT  . OSTEOARTHRITIS  . IDIOPATHIC OSTEOPOROSIS  . COLONIC POLYPS, HX OF    Medications  Prior to Visit Current Outpatient Prescriptions on File Prior to Visit  Medication Sig Dispense Refill  . alfuzosin (UROXATRAL) 10 MG 24 hr tablet Take 10 mg by mouth daily.        [name] amLODipine (NORVASC) 2.5 MG tablet TAKE 1 TABLET DAILY  90 tablet  3  . Ascorbic Acid (VITAMIN C) 1000 MG tablet Take 1,000 mg by mouth daily.        [name]  aspirin 81 MG tablet Take 81 mg by mouth daily.        . cetirizine (ZYRTEC) 10 MG tablet Take 10 mg by mouth daily.        . Cholecalciferol (VITAMIN D3) 1000 UNITS CAPS Take 2,000 Units by mouth daily.       . Coral Calcium 1000 (390 CA) MG TABS Take by mouth.        . Cyanocobalamin (NASCOBAL) 500 MCG/0.1ML SOLN by Nasal route. 1 spray in nostril every week       . dicyclomine (BENTYL) 10 MG capsule Take 1 -2 capsules every 4 to 6 hours as needed for pain  60 capsule  3  . dutasteride (AVODART) 0.5 MG capsule Take 1 capsule (0.5 mg total) by mouth daily.  90 capsule  3  . NON FORMULARY Zinc 20 mg daily       . RABEprazole (ACIPHEX) 20 MG tablet Take 1 tablet (20 mg total) by mouth 2 (two) times daily. 2 daily   180 tablet  3  . simvastatin (ZOCOR) 20 MG tablet Take 1 tablet (20 mg total) by mouth 2 (two) times daily.  90 tablet  3    Current Medications (verified) Current Outpatient Prescriptions  Medication Sig Dispense Refill  . alfuzosin (UROXATRAL) 10 MG 24 hr tablet Take 10 mg by mouth daily.        [name] amLODipine (NORVASC) 2.5 MG tablet TAKE 1 TABLET DAILY  90 tablet  3  . Ascorbic Acid (VITAMIN C) 1000 MG tablet Take 1,000 mg by mouth daily.        [name] aspirin 81 MG tablet Take 81 mg by mouth daily.        . cetirizine (ZYRTEC) 10 MG tablet Take 10 mg by mouth daily.        . Cholecalciferol (VITAMIN D3) 1000 UNITS CAPS Take 2,000 Units by mouth daily.       . Coral Calcium 1000 (390 CA) MG TABS Take by mouth.        . Cyanocobalamin (NASCOBAL) 500 MCG/0.1ML SOLN by Nasal route. 1 spray in nostril every week       . dicyclomine (BENTYL) 10 MG capsule  Take 1 -2 capsules every 4 to 6 hours as needed for pain  60 capsule  3  . dutasteride (AVODART) 0.5 MG capsule Take 1 capsule (0.5 mg total) by mouth daily.  90 capsule  3  . NON FORMULARY Zinc 20 mg daily       . RABEprazole (ACIPHEX) 20 MG tablet Take 1 tablet (20 mg total) by mouth 2 (two) times daily. 2 daily   180 tablet  3  . simvastatin (ZOCOR) 20 MG tablet Take 1 tablet (20 mg total) by mouth 2 (two) times daily.  90 tablet  3     Allergies (verified) Review of patient's allergies indicates no known allergies.   PAST HISTORY  Family History Family History  Problem Relation Age of Onset  . Heart disease Mother   . Heart disease Father   . Colon cancer Neg Hx   . Colon polyps Mother     Social History History  Substance Use Topics  . Smoking status: Never Smoker   . Smokeless tobacco: Never Used  . Alcohol Use: Yes     occasionally    Are there smokers in your home (other than you)?  No  Risk Factors Current exercise habits: Gym/ health club routine includes cardio and light weights.  Dietary issues  discussed: none   Cardiac risk factors: advanced age (older than 105 for men, 68 for women), hypertension and male gender.  Depression Screen (Note: if answer to either of the following is "Yes", a more complete depression scree2ning is indicated)   Q1: Over the past two weeks, have you felt down, depressed or hopeless? No  Q2: Over the past two weeks, have you felt little interest or pleasure in doing things? No  Have you lost interest or pleasure in daily life? No  Do you often feel hopeless? No  Do you cry easily over simple problems? No  Activities of Daily Living In your present state of health, do you have any difficulty performing the following activities?:  Driving? No Managing money?  No Feeding yourself? No Getting from bed to chair? No Climbing a flight of stairs? No Preparing food and eating?: No Bathing or showering? No Getting dressed:  No Getting to the toilet? No Using the toilet:No Moving around from place to place: No In the past year have you fallen or had a near fall?:No   Are you sexually active?  No  Do you have more than one partner?  No  Hearing Difficulties: No Do you often ask people to speak up or repeat themselves? No Do you experience ringing or noises in your ears? No Do you have difficulty understanding soft or whispered voices? No   Do you feel that you have a problem with memory? No  Do you often misplace items? No  Do you feel safe at home?  No  Cognitive Testing  Alert? yes  Normal Appearance?Yes  Oriented to person? Yes  Place? Yes   Time? Yes  Recall of three objects?  Yes  Can perform simple calculations? Yes  Displays appropriate judgment?Yes  Can read the correct time from a watch face?Yes   Advanced Directives have been discussed with the patient? Yes   List the Names of Other Physician/Practitioners you currently use: 1.    Indicate any recent Medical Services you may have received from other than [facility] providers in the past year (date may be approximate).  Immunization History  Administered Date(s) Administered  . H1N1 05/03/2008  . Influenza Whole 05/20/2002, 03/01/2009  . Pneumococcal Polysaccharide 05/20/2005  . Td 05/20/2005    Screening Tests Health Maintenance  Topic Date Due  . Zostavax  01/29/1994  . Influenza Vaccine  02/18/2011  . Tetanus/tdap  05/21/2015  . Colonoscopy  09/09/2018  . Pneumococcal Polysaccharide Vaccine Age 42 And Over  Completed    All answers were reviewed with the patient and necessary referrals were made:  [name]   04/29/2011   History reviewed: allergies, current medications, past family history, past medical history, past social history, past surgical history and problem list  Review of Systems A comprehensive review of systems was negative.    Objective:     Vision by Snellen chart: right eye:20/20, left  eye:20/20 Blood pressure 134/72, pulse 60, temperature 98.2 F (36.8 C), resp. rate 16, height 5\' 8"  (1.727 m), weight 144 lb (65.318 kg). Body mass index is 21.90 kg/(m^2).  BP 134/72  Pulse 60  Temp 98.2 F (36.8 C)  Resp 16  Ht 5\' 8"  (1.727 m)  Wt 144 lb (65.318 kg)  BMI 21.90 kg/m2  General Appearance:    Alert, cooperative, no distress, appears stated age  Head:    Normocephalic, without obvious abnormality, atraumatic  Eyes:    PERRL, conjunctiva/corneas clear, EOM's intact, fundi  benign, both eyes       Ears:    Normal TM's and external ear canals, both ears  Nose:   Nares normal, septum midline, mucosa normal, no drainage    or sinus tenderness  Throat:   Lips, mucosa, and tongue normal; teeth and gums normal  Neck:   Supple, symmetrical, trachea midline, no adenopathy;       thyroid:  No enlargement/tenderness/nodules; no carotid   bruit or JVD  Back:     Symmetric, no curvature, ROM normal, no CVA tenderness  Lungs:     Clear to auscultation bilaterally, respirations unlabored  Chest wall:    No tenderness or deformity  Heart:    Regular rate and rhythm, S1 and S2 normal, no murmur, rub   or gallop  Abdomen:     Soft, non-tender, bowel sounds active all four quadrants,    no masses, no organomegaly  Genitalia:    Normal male without lesion, discharge or tenderness  Rectal:    Normal tone, normal prostate, no masses or tenderness;   guaiac negative stool  Extremities:   Extremities normal, atraumatic, no cyanosis or edema  Pulses:   2+ and symmetric all extremities  Skin:   Skin color, texture, turgor normal, no rashes or lesions  Lymph nodes:   Cervical, supraclavicular, and axillary nodes normal  Neurologic:   CNII-XII intact. Normal strength, sensation and reflexes      throughout       Assessment:      Patient presents for yearly preventative medicine examination.   all immunizations and health maintenance protocols were reviewed with the patient and  they are up to date with these protocols.   screening laboratory values were reviewed with the patient including screening of hyperlipidemia PSA renal function and hepatic function.   There medications past medical history social history problem list and allergies were reviewed in detail.   Goals were established with regard to weight loss exercise diet in compliance with medications      Plan:     During the course of the visit the patient was educated and counseled about appropriate screening and preventive services including:    Influenza vaccine  Prostate cancer screening  Diet review for nutrition referral? Yes ____  Not Indicated ___x_   Patient Instructions (the written plan) was given to the patient.  Medicare Attestation I have personally reviewed: The patient's medical and social history Their use of alcohol, tobacco or illicit drugs Their current medications and supplements The patient's functional ability including ADLs,fall risks, home safety risks, cognitive, and hearing and visual impairment Diet and physical activities Evidence for depression or mood disorders  The patient's weight, height, BMI, and visual acuity have been recorded in the chart.  I have made referrals, counseling, and provided education to the patient based on review of the above and I have provided the patient with a written personalized care plan for preventive services.     Darryll Capers EDWARD   04/29/2011

## 2011-06-03 DIAGNOSIS — N529 Male erectile dysfunction, unspecified: Secondary | ICD-10-CM | POA: Diagnosis not present

## 2011-06-03 DIAGNOSIS — N401 Enlarged prostate with lower urinary tract symptoms: Secondary | ICD-10-CM | POA: Diagnosis not present

## 2011-06-13 ENCOUNTER — Telehealth: Payer: Self-pay | Admitting: *Deleted

## 2011-06-13 ENCOUNTER — Other Ambulatory Visit: Payer: Self-pay | Admitting: *Deleted

## 2011-06-13 DIAGNOSIS — R350 Frequency of micturition: Secondary | ICD-10-CM | POA: Diagnosis not present

## 2011-06-13 DIAGNOSIS — R39198 Other difficulties with micturition: Secondary | ICD-10-CM | POA: Diagnosis not present

## 2011-06-13 MED ORDER — CYANOCOBALAMIN 500 MCG/0.1ML NA SOLN: NASAL | Status: DC

## 2011-06-13 NOTE — Telephone Encounter (Signed)
LM for pt to call back.

## 2011-06-13 NOTE — Telephone Encounter (Signed)
Notified pt. 

## 2011-06-13 NOTE — Telephone Encounter (Signed)
Refilled nascobal spray

## 2011-06-13 NOTE — Telephone Encounter (Signed)
Yes he may go to [facility] b12 and per dr Turner Daniels thinks [facility] is best

## 2011-06-13 NOTE — Telephone Encounter (Signed)
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  Pharmacologist.  Do you think [employer] or [employer] is the best?

## 2011-06-20 DIAGNOSIS — N529 Male erectile dysfunction, unspecified: Secondary | ICD-10-CM | POA: Diagnosis not present

## 2011-06-20 DIAGNOSIS — N401 Enlarged prostate with lower urinary tract symptoms: Secondary | ICD-10-CM | POA: Diagnosis not present

## 2011-06-20 DIAGNOSIS — N486 Induration penis plastica: Secondary | ICD-10-CM | POA: Diagnosis not present

## 2011-06-27 ENCOUNTER — Other Ambulatory Visit: Payer: Self-pay | Admitting: Urology

## 2011-07-02 DIAGNOSIS — H2510 Age-related nuclear cataract, unspecified eye: Secondary | ICD-10-CM | POA: Diagnosis not present

## 2011-07-02 DIAGNOSIS — H3400 Transient retinal artery occlusion, unspecified eye: Secondary | ICD-10-CM | POA: Diagnosis not present

## 2011-07-16 ENCOUNTER — Encounter (HOSPITAL_BASED_OUTPATIENT_CLINIC_OR_DEPARTMENT_OTHER): Payer: Self-pay | Admitting: *Deleted

## 2011-07-16 NOTE — Progress Notes (Addendum)
NPO AFTER MN. ARRIVES AT 0730. NEEDS ISTAT. CURRENT EKG W/ CHART 09-12-10. WILL TAKE ZOCOR AM OF SURG W/ SIP OF WATER. REVIEWED [facility] GUIDELINES, WILL BRING MEDS. LAST CBC 04-29-11 (FOR PLATLET COUNT 133) IN EPIC.  PT STATES HAS HEAD COLD FOR PAST 2 DAYS. STATES NO FEVER CONGESTION SEEMS LIKE ALLERGIES, NOT IN CHEST. PT ASKING FOR ADVICE, TOLD IF GETS WORSE AND/OR HIS FEVER GO TO HIS PCP (DR Lovell Sheehan).

## 2011-07-21 NOTE — Anesthesia Preprocedure Evaluation (Addendum)
Anesthesia Evaluation  Patient identified by MRN, date of birth, ID band Patient awake    Reviewed: Allergy & Precautions, H&P , NPO status , Patient's Chart, lab work & pertinent test results  Airway Mallampati: II TM Distance: >3 FB Neck ROM: full    Dental  (+) Caps and Dental Advisory Given,    Pulmonary neg pulmonary ROS,  breath sounds clear to auscultation  Pulmonary exam normal       Cardiovascular Exercise Tolerance: Good hypertension, Pt. on medications Rhythm:regular Rate:Normal     Neuro/Psych PSYCHIATRIC DISORDERS Anxiety Depression Raynaud's.  Bil. Carotid stenosis. 50% negative psych ROS   GI/Hepatic Neg liver ROS, GERD-  Medicated and Controlled,Esoph. stenosis   Endo/Other  negative endocrine ROS  Renal/GU negative Renal ROS  negative genitourinary   Musculoskeletal   Abdominal   Peds  Hematology negative hematology ROS (+) ITP chronic   Anesthesia Other Findings   Reproductive/Obstetrics negative OB ROS                          Anesthesia Physical Anesthesia Plan  ASA: III  Anesthesia Plan: General   Post-op Pain Management:    Induction: Intravenous  Airway Management Planned: LMA  Additional Equipment:   Intra-op Plan:   Post-operative Plan:   Informed Consent: I have reviewed the patients History and Physical, chart, labs and discussed the procedure including the risks, benefits and alternatives for the proposed anesthesia with the patient or authorized representative who has indicated his/her understanding and acceptance.   Dental Advisory Given  Plan Discussed with: CRNA and Surgeon  Anesthesia Plan Comments:         Anesthesia Quick Evaluation

## 2011-07-22 ENCOUNTER — Encounter (HOSPITAL_BASED_OUTPATIENT_CLINIC_OR_DEPARTMENT_OTHER): Payer: Self-pay | Admitting: Anesthesiology

## 2011-07-22 ENCOUNTER — Ambulatory Visit (HOSPITAL_BASED_OUTPATIENT_CLINIC_OR_DEPARTMENT_OTHER)
Admission: RE | Admit: 2011-07-22 | Discharge: 2011-07-23 | Disposition: A | Discharge status: Home / Self Care | Payer: Medicare Other | Source: Ambulatory Visit | Attending: Urology | Admitting: Urology

## 2011-07-22 ENCOUNTER — Encounter (HOSPITAL_BASED_OUTPATIENT_CLINIC_OR_DEPARTMENT_OTHER)
Admission: RE | Disposition: A | Discharge status: Home / Self Care | Payer: Self-pay | Source: Ambulatory Visit | Attending: Urology

## 2011-07-22 ENCOUNTER — Encounter (HOSPITAL_BASED_OUTPATIENT_CLINIC_OR_DEPARTMENT_OTHER): Payer: Self-pay | Admitting: *Deleted

## 2011-07-22 ENCOUNTER — Ambulatory Visit (HOSPITAL_BASED_OUTPATIENT_CLINIC_OR_DEPARTMENT_OTHER): Payer: Medicare Other | Admitting: Anesthesiology

## 2011-07-22 DIAGNOSIS — N320 Bladder-neck obstruction: Secondary | ICD-10-CM | POA: Insufficient documentation

## 2011-07-22 DIAGNOSIS — N400 Enlarged prostate without lower urinary tract symptoms: Secondary | ICD-10-CM | POA: Diagnosis not present

## 2011-07-22 DIAGNOSIS — R972 Elevated prostate specific antigen [PSA]: Secondary | ICD-10-CM | POA: Diagnosis not present

## 2011-07-22 DIAGNOSIS — D693 Immune thrombocytopenic purpura: Secondary | ICD-10-CM | POA: Diagnosis not present

## 2011-07-22 DIAGNOSIS — I10 Essential (primary) hypertension: Secondary | ICD-10-CM | POA: Insufficient documentation

## 2011-07-22 DIAGNOSIS — K219 Gastro-esophageal reflux disease without esophagitis: Secondary | ICD-10-CM | POA: Insufficient documentation

## 2011-07-22 DIAGNOSIS — Z79899 Other long term (current) drug therapy: Secondary | ICD-10-CM | POA: Diagnosis not present

## 2011-07-22 DIAGNOSIS — N401 Enlarged prostate with lower urinary tract symptoms: Secondary | ICD-10-CM | POA: Diagnosis not present

## 2011-07-22 DIAGNOSIS — N138 Other obstructive and reflux uropathy: Secondary | ICD-10-CM | POA: Insufficient documentation

## 2011-07-22 DIAGNOSIS — E785 Hyperlipidemia, unspecified: Secondary | ICD-10-CM | POA: Insufficient documentation

## 2011-07-22 DIAGNOSIS — Z01812 Encounter for preprocedural laboratory examination: Secondary | ICD-10-CM | POA: Insufficient documentation

## 2011-07-22 DIAGNOSIS — R351 Nocturia: Secondary | ICD-10-CM | POA: Diagnosis not present

## 2011-07-22 DIAGNOSIS — Z7982 Long term (current) use of aspirin: Secondary | ICD-10-CM | POA: Insufficient documentation

## 2011-07-22 DIAGNOSIS — R3911 Hesitancy of micturition: Secondary | ICD-10-CM | POA: Diagnosis not present

## 2011-07-22 HISTORY — PX: TRANSURETHRAL RESECTION OF PROSTATE: SHX73

## 2011-07-22 HISTORY — DX: Occlusion and stenosis of bilateral carotid arteries: I65.23

## 2011-07-22 HISTORY — DX: Deficiency of other specified B group vitamins: E53.8

## 2011-07-22 HISTORY — DX: Benign prostatic hyperplasia without lower urinary tract symptoms: N40.0

## 2011-07-22 LAB — POCT I-STAT 4, (NA,K, GLUC, HGB,HCT)
Potassium: 4.1 mEq/L (ref 3.5–5.1)
Sodium: 142 mEq/L (ref 135–145)

## 2011-07-22 SURGERY — TRANSURETHRAL RESECTION OF THE PROSTATE WITH GYRUS INSTRUMENTS
Anesthesia: General

## 2011-07-22 MED ORDER — PROPOFOL 10 MG/ML IV EMUL
INTRAVENOUS | Status: DC | PRN
  Administered 2011-07-22: 170 mg via INTRAVENOUS

## 2011-07-22 MED ORDER — OXYBUTYNIN CHLORIDE 5 MG PO TABS
5.0000 mg | ORAL_TABLET | Freq: Three times a day (TID) | ORAL | Status: DC | PRN
  Administered 2011-07-22 (×2): 5 mg via ORAL

## 2011-07-22 MED ORDER — HYDROCODONE-ACETAMINOPHEN 5-325 MG PO TABS
1.0000 | ORAL_TABLET | ORAL | Status: DC | PRN
  Administered 2011-07-22 – 2011-07-23 (×3): 2 via ORAL

## 2011-07-22 MED ORDER — LACTATED RINGERS IV SOLN: INTRAVENOUS | Status: DC

## 2011-07-22 MED ORDER — MIDAZOLAM HCL 5 MG/5ML IJ SOLN
INTRAMUSCULAR | Status: DC | PRN
  Administered 2011-07-22 (×2): 1 mg via INTRAVENOUS

## 2011-07-22 MED ORDER — ONDANSETRON HCL 4 MG/2ML IJ SOLN
INTRAMUSCULAR | Status: DC | PRN
  Administered 2011-07-22: 4 mg via INTRAVENOUS

## 2011-07-22 MED ORDER — ZOLPIDEM TARTRATE 5 MG PO TABS
5.0000 mg | ORAL_TABLET | Freq: Every evening | ORAL | Status: AC | PRN
  Administered 2011-07-22: 5 mg via ORAL

## 2011-07-22 MED ORDER — SODIUM CHLORIDE 0.9 % IR SOLN
Status: DC | PRN
  Administered 2011-07-22: 3000 mL

## 2011-07-22 MED ORDER — SENNOSIDES 8.6 MG PO TABS: 1.0000 | ORAL_TABLET | ORAL | Status: DC | PRN

## 2011-07-22 MED ORDER — LIDOCAINE HCL 2 % EX GEL
CUTANEOUS | Status: DC | PRN
  Administered 2011-07-22: 1 via URETHRAL

## 2011-07-22 MED ORDER — FENTANYL CITRATE 0.05 MG/ML IJ SOLN
50.0000 ug | INTRAMUSCULAR | Status: DC | PRN
  Administered 2011-07-22: 50 ug via INTRAVENOUS

## 2011-07-22 MED ORDER — KCL IN DEXTROSE-NACL 20-5-0.45 MEQ/L-%-% IV SOLN
INTRAVENOUS | Status: DC
  Administered 2011-07-22: 17:00:00 via INTRAVENOUS

## 2011-07-22 MED ORDER — STERILE WATER FOR IRRIGATION IR SOLN
Status: DC | PRN
  Administered 2011-07-22: 10 mL

## 2011-07-22 MED ORDER — LACTATED RINGERS IV SOLN
INTRAVENOUS | Status: DC
  Administered 2011-07-22: 11:00:00 via INTRAVENOUS
  Administered 2011-07-22: 100 mL/h via INTRAVENOUS

## 2011-07-22 MED ORDER — DUTASTERIDE 0.5 MG PO CAPS: 0.5000 mg | ORAL_CAPSULE | Freq: Every day | ORAL | Status: DC

## 2011-07-22 MED ORDER — DEXAMETHASONE SODIUM PHOSPHATE 4 MG/ML IJ SOLN
INTRAMUSCULAR | Status: DC | PRN
  Administered 2011-07-22: 4 mg via INTRAVENOUS

## 2011-07-22 MED ORDER — CIPROFLOXACIN IN D5W 400 MG/200ML IV SOLN
400.0000 mg | Freq: Two times a day (BID) | INTRAVENOUS | Status: DC
  Administered 2011-07-22: 400 mg via INTRAVENOUS

## 2011-07-22 MED ORDER — MORPHINE SULFATE 2 MG/ML IJ SOLN: 2.0000 mg | INTRAMUSCULAR | Status: DC | PRN

## 2011-07-22 MED ORDER — CIPROFLOXACIN IN D5W 400 MG/200ML IV SOLN
400.0000 mg | INTRAVENOUS | Status: AC
  Administered 2011-07-22: 400 mg via INTRAVENOUS

## 2011-07-22 MED ORDER — SIMVASTATIN 20 MG PO TABS: 20.0000 mg | ORAL_TABLET | Freq: Two times a day (BID) | ORAL | Status: DC

## 2011-07-22 MED ORDER — BELLADONNA ALKALOIDS-OPIUM 16.2-60 MG RE SUPP
RECTAL | Status: DC | PRN
  Administered 2011-07-22: 1 via RECTAL

## 2011-07-22 MED ORDER — LIDOCAINE HCL (CARDIAC) 20 MG/ML IV SOLN
INTRAVENOUS | Status: DC | PRN
  Administered 2011-07-22: 80 mg via INTRAVENOUS

## 2011-07-22 MED ORDER — ALFUZOSIN HCL ER 10 MG PO TB24: 10.0000 mg | ORAL_TABLET | Freq: Every day | ORAL | Status: DC

## 2011-07-22 MED ORDER — FENTANYL CITRATE 0.05 MG/ML IJ SOLN
INTRAMUSCULAR | Status: DC | PRN
  Administered 2011-07-22: 25 ug via INTRAVENOUS
  Administered 2011-07-22: 50 ug via INTRAVENOUS
  Administered 2011-07-22 (×4): 25 ug via INTRAVENOUS

## 2011-07-22 MED ORDER — ONDANSETRON HCL 4 MG/2ML IJ SOLN: 4.0000 mg | INTRAMUSCULAR | Status: DC | PRN

## 2011-07-22 MED ORDER — AMLODIPINE BESYLATE 2.5 MG PO TABS: 2.5000 mg | ORAL_TABLET | Freq: Every day | ORAL | Status: DC

## 2011-07-22 MED ORDER — FENTANYL CITRATE 0.05 MG/ML IJ SOLN
25.0000 ug | INTRAMUSCULAR | Status: DC | PRN
  Administered 2011-07-22: 25 ug via INTRAVENOUS
  Administered 2011-07-22: 50 ug via INTRAVENOUS
  Administered 2011-07-22: 25 ug via INTRAVENOUS
  Administered 2011-07-22: 50 ug via INTRAVENOUS

## 2011-07-22 SURGICAL SUPPLY — 33 items
BAG DRAIN URO-CYSTO SKYTR STRL (DRAIN) ×2 IMPLANT
BAG DRN ANRFLXCHMBR STRAP LEK (BAG)
BAG DRN UROCATH (DRAIN) ×1
BAG URINE DRAINAGE (UROLOGICAL SUPPLIES) ×1 IMPLANT
BAG URINE LEG 19OZ MD ST LTX (BAG) IMPLANT
CANISTER SUCT LVC 12 LTR MEDI- (MISCELLANEOUS) ×3 IMPLANT
CATH FOLEY 2WAY SLVR  5CC 20FR (CATHETERS)
CATH FOLEY 2WAY SLVR  5CC 22FR (CATHETERS)
CATH FOLEY 2WAY SLVR 5CC 20FR (CATHETERS) IMPLANT
CATH FOLEY 2WAY SLVR 5CC 22FR (CATHETERS) IMPLANT
CATH FOLEY 3WAY 30CC 22F (CATHETERS) ×1 IMPLANT
CLOTH BEACON ORANGE TIMEOUT ST (SAFETY) ×2 IMPLANT
DRAPE CAMERA CLOSED 9X96 (DRAPES) ×2 IMPLANT
ELECT BUTTON HF 24-28F 2 30DE (ELECTRODE) ×1 IMPLANT
ELECT LOOP MED HF 24F 12D CBL (CLIP) ×1 IMPLANT
ELECT REM PT RETURN 9FT ADLT (ELECTROSURGICAL)
ELECT RESECT VAPORIZE 12D CBL (ELECTRODE) ×1 IMPLANT
ELECTRODE REM PT RTRN 9FT ADLT (ELECTROSURGICAL) ×1 IMPLANT
EVACUATOR MICROVAS BLADDER (UROLOGICAL SUPPLIES) IMPLANT
GLOVE BIO SURGEON STRL SZ7.5 (GLOVE) ×2 IMPLANT
GLOVE BIOGEL M 6.5 STRL (GLOVE) ×1 IMPLANT
GLOVE BIOGEL PI IND STRL 6.5 (GLOVE) IMPLANT
GLOVE BIOGEL PI INDICATOR 6.5 (GLOVE) ×1
GLOVE ECLIPSE 6.0 STRL STRAW (GLOVE) ×1 IMPLANT
GOWN STRL NON-REIN LRG LVL3 (GOWN DISPOSABLE) ×1 IMPLANT
GOWN STRL REIN XL XLG (GOWN DISPOSABLE) ×2 IMPLANT
HOLDER FOLEY CATH W/STRAP (MISCELLANEOUS) ×1 IMPLANT
KIT ASPIRATION TUBING (SET/KITS/TRAYS/PACK) ×1 IMPLANT
PACK CYSTOSCOPY (CUSTOM PROCEDURE TRAY) ×2 IMPLANT
PLUG CATH AND CAP STER (CATHETERS) IMPLANT
SET ASPIRATION TUBING (TUBING) ×1 IMPLANT
SYR 30ML LL (SYRINGE) ×1 IMPLANT
SYRINGE IRR TOOMEY STRL 70CC (SYRINGE) ×1 IMPLANT

## 2011-07-22 NOTE — Addendum Note (Signed)
Addendum  created 07/22/11 1112 by Gaetano Hawthorne, MD   Modules edited:Orders

## 2011-07-22 NOTE — Anesthesia Procedure Notes (Signed)
Procedure Name: LMA Insertion Date/Time: 07/22/2011 8:52 AM Performed by: Renella Cunas D Pre-anesthesia Checklist: Patient identified, Emergency Drugs available, Suction available and Patient being monitored Patient Re-evaluated:Patient Re-evaluated prior to inductionOxygen Delivery Method: Circle System Utilized Preoxygenation: Pre-oxygenation with 100% oxygen Intubation Type: IV induction Ventilation: Mask ventilation without difficulty LMA: LMA inserted LMA Size: 4.0 Number of attempts: 1 Airway Equipment and Method: bite block Placement Confirmation: positive ETCO2 Tube secured with: Tape Dental Injury: Teeth and Oropharynx as per pre-operative assessment

## 2011-07-22 NOTE — Op Note (Signed)
Preoperative diagnosis: Benign prostatic hyperplasia/bladder neck obstruction Postoperative diagnosis: Same  Procedure: Saline gyrus TURP   Surgeon: Valetta Fuller M.D.  Anesthesia: Gen.  Indications: Corey Fischer is 78 years of age. He has had long-standing progressive voiding symptoms. He has remained on Avodart as well as alpha-blocker therapy. His situation has worsened. Recent urodynamics were performed which continued to show evidence of outlet obstruction. The to various procedures were discussed with him. He elected to proceed with gyrus TURP.     Technique and findings: Patient was brought to the operating room where he had successful induction of general anesthesia. The patient had placement of PAS compression boots and received perioperative ciprofloxacin. Appropriate surgical timeout was performed. A 26 French continuous flow resectoscope sheath was placed. Saline was used as a bladder irrigation. We initially performed a gyrus procedure utilizing the button electrode. We initiated treatment at the bladder neck on the right side and worked anteriorly to posteriorly. Lateral lobe tissue was then vaporized out towards the apex of the prostate. This was completed on both sides. End of the procedure we elected to change the button electrode to a standard loop. This allowed [place] to smooth out several areas and to take a little bit of additional tissue at the apex. Sphincter complex appeared completely intact at the end of the procedure. The visual obstruction was markedly improved and appeared to have resolved. A small number prostate chips were removed and sent for pathologic analysis. Lidocaine jelly was instilled per urethra the 22 French three-way continuous flow Foley catheter was placed. Urine was light pink in color. The patient was brought to recovery room in stable condition having had no obvious complications or problems.

## 2011-07-22 NOTE — Transfer of Care (Signed)
Immediate Anesthesia Transfer of Care Note  Patient: Corey Fischer  Procedure(s) Performed: Procedure(s) (LRB): TRANSURETHRAL RESECTION OF THE PROSTATE WITH GYRUS INSTRUMENTS (N/A)  Patient Location: PACU  Anesthesia Type: General  Level of Consciousness: awake, oriented, sedated and patient cooperative  Airway & Oxygen Therapy: Patient Spontanous Breathing and Patient connected to face mask oxygen  Post-op Assessment: Report given to PACU RN and Post -op Vital signs reviewed and stable  Post vital signs: Reviewed and stable  Complications: No apparent anesthesia complications

## 2011-07-22 NOTE — H&P (Signed)
Corey Fischer is an 78 y.o. male.   Chief Complaint: BPH/ BNO for TURP  HPI: Corey Fischer  has had some longstanding issues with benign prostatic hyperplasia and outlet obstruction. In the past he had a mildly elevated PSA and underwent a negative biopsy. Recently he has been managed on Avodart and generic Uroxatral. When I saw him last year he seemed to be quite satisfied with things but it does appear that his voiding status has worsened over the last year. His nocturia has increased form one time in the evening up to 2 to 3 and there have been nights where he has been getting up as often as every hour. Stream can be somewhat weak in the evening hours along with some increased hesitancy. He is not certain he is emptying his bladder completely. Recent PSA testing was apparently within normal limits. He does continue to have some retrograde or minimal ejaculation presumptively due to the alpha-blocker along with the Avodart. At this point he is considering whether something more definitive should be done.  At that time of urodynamics the patient did have some hypersensitivity with a somewhat reduced functional bladder capacity. No gross evidence of instability. The patient's peak flow was around 13-14 mL per second with an average flow of the 5-6 mL range. Pressure flow studies put him in the equivocal to obstructive category. He had fairly good bladder emptying. He is here to discuss this as well as his options.   Past Medical History  Diagnosis Date  . Chronic ITP (idiopathic thrombocytopenia)   . Hx of adenomatous colonic polyps 2010  . Diverticulosis   . GERD (gastroesophageal reflux disease)   . Allergy   . Hyperlipidemia   . Hypertension   . Elevated PSA   . Anxiety   . Depression   . Stricture and stenosis of esophagus   . Gastritis   . Hemorrhoids   . History of hiatal hernia   . Raynaud's syndrome   . Gastric polyp   . Asymptomatic bilateral carotid artery stenosis per duplex  05-26-2007--  bil. ica 40-59%  . Constipation   . Head cold   . Arthritis   . Osteoarthritis hips  . BPH (benign prostatic hyperplasia)   . Nocturia   . B12 deficiency     Past Surgical History  Procedure Date  . Rotator cuff repair 1988    RIGHT  . Tonsillectomy age 72  . Appendectomy age 5  . Laparoscopic inguinal hernia repair 09-19-2010    BILATERAL  . Left shoulder surg. 2007  . Cholecystectomy 1989  . Carotid duplex 05-26-2007    BILATERAL ICA  40-59%    Family History  Problem Relation Age of Onset  . Heart disease Mother   . Heart disease Father   . Colon cancer Neg Hx   . Colon polyps Mother    Social History:  reports that he has never smoked. He has never used smokeless tobacco. He reports that he drinks alcohol. He reports that he does not use illicit drugs.  Allergies: No Known Allergies  Medications Prior to Admission  Medication Dose Route Frequency Provider Last Rate Last Dose  . ciprofloxacin (CIPRO) IVPB 400 mg  400 mg Intravenous 60 min Pre-Op    400 mg at 07/22/11 0811  . lactated ringers infusion   Intravenous Continuous Corey Pope, MD 100 mL/hr at 07/22/11 0805 100 mL/hr at 07/22/11 0805   Medications Prior to Admission  Medication Sig Dispense Refill  . alfuzosin (  UROXATRAL) 10 MG 24 hr tablet Take 10 mg by mouth daily.       [name] amLODipine (NORVASC) 2.5 MG tablet       . Ascorbic Acid (VITAMIN C) 1000 MG tablet Take 1,000 mg by mouth daily.       [name] aspirin 81 MG tablet Take 81 mg by mouth daily.       . cetirizine (ZYRTEC) 10 MG tablet Take 10 mg by mouth daily.       . Cholecalciferol (VITAMIN D3) 1000 UNITS CAPS Take 2,000 Units by mouth daily.       . Coral Calcium 1000 (390 CA) MG TABS Take by mouth.       . Cyanocobalamin 500 MCG/0.1ML SOLN every 7 (seven) days. 1 spray in nostril every week      . dicyclomine (BENTYL) 10 MG capsule Take 1 -2 capsules every 4 to 6 hours as needed for pain  60 capsule  3  . dutasteride (AVODART) 0.5 MG  capsule Take 1 capsule (0.5 mg total) by mouth daily.  90 capsule  3  . Multiple Vitamins-Minerals (ZINC PO) Take 20 mg by mouth daily.      . polyethylene glycol (MIRALAX / GLYCOLAX) packet Take 17 g by mouth as needed.      . RABEprazole (ACIPHEX) 20 MG tablet Take 20 mg by mouth every evening. 2 daily      . senna (SENOKOT) 8.6 MG tablet Take 1 tablet by mouth as needed.      . simvastatin (ZOCOR) 20 MG tablet Take 1 tablet (20 mg total) by mouth 2 (two) times daily.  90 tablet  3  . tadalafil (CIALIS) 5 MG tablet Take 5 mg by mouth daily as needed.        Results for orders placed during the hospital encounter of 07/22/11 (from the past 48 hour(s))  POCT I-STAT 4, (NA,K, GLUC, HGB,HCT)     Status: Normal   Collection Time   07/22/11  8:09 AM      Component Value Range Comment   Sodium 142  135 - 145 (mEq/L)    Potassium 4.1  3.5 - 5.1 (mEq/L)    Glucose, Bld 91  70 - 99 (mg/dL)    HCT 16.1  09.6 - 04.5 (%)    Hemoglobin 15.3  13.0 - 17.0 (g/dL)    No results found.  Review of Systems - Negative except Urinary frequency, nocturia, urgency and weak stream.  Height 5\' 8"  (1.727 m), weight 65.772 kg (145 lb). General appearance: alert, cooperative and no distress Neck: no adenopathy and no JVD Resp: clear to auscultation bilaterally Cardio: regular rate and rhythm GI: soft, non-tender; bowel sounds normal; no masses,  no organomegaly Male genitalia: normal, penis: no lesions or discharge. testes: no masses or tenderness. no hernias Prostate is 2+ smooth. Extremities: extremities normal, atraumatic, no cyanosis or edema Skin: Skin color, texture, turgor normal. No rashes or lesions Neurologic: Grossly normal  Assessment/Plan Corey Fischer clearly has had some longstanding and progressive voiding symptoms. Urodynamics suggest some obstruction based on flow rates and pressure flow studies. Cystoscopically he does have trilobar hyperplasia with visual obstruction and a prostatic urethra  that appears to be about 4 cm in length. We went over a multitude of options with him. This would include doing nothing different. He could have a change in medication with a change in alpha-blocker and/or addition of Cialis. We also discussed in length the potential for office cool thermotherapy versus TURP  and the advantages and disadvantages of the various therapies. He is interested in trying to get off medical therapy. He understands there is no treatment that can guarantee that, but obviously his best odds would be potentially with TURP. His erectile dysfunction seems to have progressed a bit as well. His erections are not as firm. He may want to try some low-dose Cialis on a daily basis in the near term just to see how much it improves his voiding as well as his erectile dysfunction. At this point he is leaning towards having a TURP potentially in March and we will see if he wants to schedule that at this time or wait and give [place] a call down the road. Otherwise, from a erectile dysfunction standpoint, we can continue to work with oral agents to see if we can get things improved.   Mane Consolo S 07/22/2011, 8:17 AM

## 2011-07-22 NOTE — Anesthesia Postprocedure Evaluation (Signed)
  Anesthesia Post-op Note  Patient: Corey Fischer  Procedure(s) Performed: Procedure(s) (LRB): TRANSURETHRAL RESECTION OF THE PROSTATE WITH GYRUS INSTRUMENTS (N/A)  Patient Location: PACU  Anesthesia Type: General  Level of Consciousness: awake and alert   Airway and Oxygen Therapy: Patient Spontanous Breathing  Post-op Pain: mild  Post-op Assessment: Post-op Vital signs reviewed, Patient's Cardiovascular Status Stable, Respiratory Function Stable, Patent Airway and No signs of Nausea or vomiting  Post-op Vital Signs: stable  Complications: No apparent anesthesia complications

## 2011-07-23 ENCOUNTER — Encounter (HOSPITAL_BASED_OUTPATIENT_CLINIC_OR_DEPARTMENT_OTHER): Payer: Self-pay | Admitting: Urology

## 2011-07-23 LAB — BASIC METABOLIC PANEL
BUN: 9 mg/dL (ref 6–23)
Calcium: 9.3 mg/dL (ref 8.4–10.5)
Creatinine, Ser: 0.99 mg/dL (ref 0.50–1.35)
GFR calc non Af Amer: 77 mL/min — ABNORMAL LOW (ref >90)
Glucose, Bld: 142 mg/dL — ABNORMAL HIGH (ref 70–99)
Potassium: 3.9 mEq/L (ref 3.5–5.1)

## 2011-07-23 MED ORDER — HYDROCODONE-ACETAMINOPHEN 5-325 MG PO TABS: 1.0000 | ORAL_TABLET | ORAL | Status: AC | PRN

## 2011-07-23 MED ORDER — CIPROFLOXACIN HCL 500 MG PO TABS: 500.0000 mg | ORAL_TABLET | Freq: Two times a day (BID) | ORAL | Status: AC

## 2011-07-23 NOTE — Discharge Instructions (Signed)

## 2011-07-23 NOTE — Discharge Summary (Signed)
Physician Discharge Summary  Patient ID: Corey Fischer MRN: [id number] DOB/AGE: 1931/12/06 78 y.o.  Admit date: 07/22/2011 Discharge date: 07/23/2011  Admission Diagnoses:  Discharge Diagnoses:  Active Problems:  BPH (benign prostatic hyperplasia)   Discharged Condition: good  Hospital Course: Patient underwent uneventful TURP. He was kept overnight in observation and no complications or problems. Urine was light pink at the time of discharge. He had no complaints her issues.  Consults: None  Significant Diagnostic Studies: None significant  Treatments: surgery: Gyrus saline TURP  Discharge Exam: Blood pressure 115/62, pulse 61, temperature 98.9 F (37.2 C), temperature source Oral, resp. rate 18, height 5\' 8"  (1.727 m), weight 65.772 kg (145 lb), SpO2 96.00%. We'll order show no acute distress. Abdomen soft and nontender. Heart regular rate and rhythm. Her story effort normal. Extremities without edema.  Disposition: 01-Home or Self Care  Discharge Orders    Future Appointments: Provider: Department: Dept Phone: Center:   08/02/2011 1:45 PM [name], MD [facility] [phone] [facility]     Medication List  As of 07/23/2011  8:03 AM   STOP taking these medications         aspirin 81 MG tablet      tadalafil 5 MG tablet         TAKE these medications         alfuzosin 10 MG 24 hr tablet   Commonly known as: UROXATRAL   Take 10 mg by mouth daily.      amLODipine 2.5 MG tablet   Commonly known as: NORVASC      cetirizine 10 MG tablet   Commonly known as: ZYRTEC   Take 10 mg by mouth daily.      ciprofloxacin 500 MG tablet   Commonly known as: CIPRO   Take 1 tablet (500 mg total) by mouth 2 (two) times daily.      Coral Calcium 1000 (390 CA) MG Tabs   Take by mouth.      Cyanocobalamin 500 MCG/0.1ML Soln   every 7 (seven) days. 1 spray in nostril every week      dicyclomine 10 MG capsule   Commonly known as: BENTYL   Take 1 -2 capsules every 4  to 6 hours as needed for pain      dutasteride 0.5 MG capsule   Commonly known as: AVODART   Take 1 capsule (0.5 mg total) by mouth daily.      HYDROcodone-acetaminophen 5-325 MG per tablet   Commonly known as: NORCO   Take 1-2 tablets by mouth every 4 (four) hours as needed.      polyethylene glycol packet   Commonly known as: MIRALAX / GLYCOLAX   Take 17 g by mouth as needed.      RABEprazole 20 MG tablet   Commonly known as: ACIPHEX   Take 20 mg by mouth every evening. 2 daily      senna 8.6 MG tablet   Commonly known as: SENOKOT   Take 1 tablet by mouth as needed.      simvastatin 20 MG tablet   Commonly known as: ZOCOR   Take 1 tablet (20 mg total) by mouth 2 (two) times daily.      vitamin C 1000 MG tablet   Take 1,000 mg by mouth daily.      Vitamin D3 1000 UNITS Caps   Take 2,000 Units by mouth daily.      ZINC PO   Take 20 mg by mouth daily.  Follow-up Information    Please follow up. (we will call)          Signed: Leauna Sharber S 07/23/2011, 8:03 AM

## 2011-07-26 DIAGNOSIS — N401 Enlarged prostate with lower urinary tract symptoms: Secondary | ICD-10-CM | POA: Diagnosis not present

## 2011-08-02 ENCOUNTER — Encounter: Payer: Self-pay | Admitting: Internal Medicine

## 2011-08-02 ENCOUNTER — Ambulatory Visit (INDEPENDENT_AMBULATORY_CARE_PROVIDER_SITE_OTHER): Payer: Medicare Other | Admitting: Internal Medicine

## 2011-08-02 VITALS — BP 140/64 | HR 60 | Temp 98.2°F | Resp 16 | Ht 68.0 in | Wt 148.0 lb

## 2011-08-02 DIAGNOSIS — D473 Essential (hemorrhagic) thrombocythemia: Secondary | ICD-10-CM | POA: Diagnosis not present

## 2011-08-02 DIAGNOSIS — I10 Essential (primary) hypertension: Secondary | ICD-10-CM | POA: Diagnosis not present

## 2011-08-02 DIAGNOSIS — E785 Hyperlipidemia, unspecified: Secondary | ICD-10-CM

## 2011-08-02 NOTE — Patient Instructions (Signed)
The patient is instructed to continue all medications as prescribed. Schedule followup with check out clerk upon leaving the clinic  

## 2011-08-02 NOTE — Progress Notes (Signed)
Subjective:    Patient ID: Corey Fischer, male    DOB: 1932/05/17, 78 y.o.   MRN: [id number]  HPI Follow up of prostate surgery for BPH with obstruction Has post op hematuria and buring No fever of chill   Review of Systems  Constitutional: Negative for fever and fatigue.  HENT: Negative for hearing loss, congestion, neck pain and postnasal drip.   Eyes: Negative for discharge, redness and visual disturbance.  Respiratory: Negative for cough, shortness of breath and wheezing.   Cardiovascular: Negative for leg swelling.  Gastrointestinal: Negative for abdominal pain, constipation and abdominal distention.  Genitourinary: Negative for urgency and frequency.  Musculoskeletal: Negative for joint swelling and arthralgias.  Skin: Negative for color change and rash.  Neurological: Negative for weakness and light-headedness.  Hematological: Negative for adenopathy.  Psychiatric/Behavioral: Negative for behavioral problems.    Past Medical History  Diagnosis Date  . Chronic ITP (idiopathic thrombocytopenia)   . Hx of adenomatous colonic polyps 2010  . Diverticulosis   . GERD (gastroesophageal reflux disease)   . Allergy   . Hyperlipidemia   . Hypertension   . Elevated PSA   . Anxiety   . Depression   . Stricture and stenosis of esophagus   . Gastritis   . Hemorrhoids   . History of hiatal hernia   . Raynaud's syndrome   . Gastric polyp   . Asymptomatic bilateral carotid artery stenosis per duplex 05-26-2007--  bil. ica 40-59%  . Constipation   . Head cold   . Arthritis   . Osteoarthritis hips  . BPH (benign prostatic hyperplasia)   . Nocturia   . B12 deficiency     History   Social History  . Marital Status: Married    Spouse Name: N/A    Number of Children: 2  . Years of Education: N/A   Occupational History  . retired    Social History Main Topics  . Smoking status: Never Smoker   . Smokeless tobacco: Never Used  . Alcohol Use: Yes     occasionally  . Drug  Use: No  . Sexually Active: Yes   Other Topics Concern  . Not on file   Social History Narrative  . No narrative on file    Past Surgical History  Procedure Date  . Rotator cuff repair 1988    RIGHT  . Tonsillectomy age 65  . Appendectomy age 45  . Laparoscopic inguinal hernia repair 09-19-2010    BILATERAL  . Left shoulder surg. 2007  . Cholecystectomy 1989  . Carotid duplex 05-26-2007    BILATERAL ICA  40-59%  . Transurethral resection of prostate 07/22/2011    Procedure: TRANSURETHRAL RESECTION OF THE PROSTATE WITH GYRUS INSTRUMENTS;  Surgeon: Valetta Fuller, MD;  Location: [facility];  Service: Urology;  Laterality: N/A;  Saline Gyrus TURP  1 hour requested for this procedure  GYRUS    Family History  Problem Relation Age of Onset  . Heart disease Mother   . Heart disease Father   . Colon cancer Neg Hx   . Colon polyps Mother     No Known Allergies  Current Outpatient Prescriptions on File Prior to Visit  Medication Sig Dispense Refill  . alfuzosin (UROXATRAL) 10 MG 24 hr tablet Take 10 mg by mouth daily.       [name] amLODipine (NORVASC) 2.5 MG tablet       . Ascorbic Acid (VITAMIN C) 1000 MG tablet Take 1,000 mg by mouth daily.       [name]  cetirizine (ZYRTEC) 10 MG tablet Take 10 mg by mouth daily.       . Cholecalciferol (VITAMIN D3) 1000 UNITS CAPS Take 2,000 Units by mouth daily.       . ciprofloxacin (CIPRO) 500 MG tablet Take 1 tablet (500 mg total) by mouth 2 (two) times daily.  10 tablet  0  . Coral Calcium 1000 (390 CA) MG TABS Take by mouth.       . Cyanocobalamin 500 MCG/0.1ML SOLN every 7 (seven) days. 1 spray in nostril every week      . dicyclomine (BENTYL) 10 MG capsule Take 1 -2 capsules every 4 to 6 hours as needed for pain  60 capsule  3  . dutasteride (AVODART) 0.5 MG capsule Take 1 capsule (0.5 mg total) by mouth daily.  90 capsule  3  . HYDROcodone-acetaminophen (NORCO) 5-325 MG per tablet Take 1-2 tablets by mouth every 4 (four)  hours as needed.  20 tablet  0  . Multiple Vitamins-Minerals (ZINC PO) Take 20 mg by mouth daily.      . polyethylene glycol (MIRALAX / GLYCOLAX) packet Take 17 g by mouth as needed.      . RABEprazole (ACIPHEX) 20 MG tablet Take 20 mg by mouth every evening. 2 daily      . senna (SENOKOT) 8.6 MG tablet Take 1 tablet by mouth as needed.      . simvastatin (ZOCOR) 20 MG tablet Take 1 tablet (20 mg total) by mouth 2 (two) times daily.  90 tablet  3    BP 140/64  Pulse 60  Temp 98.2 F (36.8 C)  Resp 16  Ht 5\' 8"  (1.727 m)  Wt 148 lb (67.132 kg)  BMI 22.50 kg/m2         Objective:   Physical Exam  Constitutional: He appears well-developed and well-nourished.  HENT:  Head: Normocephalic and atraumatic.  Eyes: Conjunctivae are normal. Pupils are equal, round, and reactive to light.  Neck: Normal range of motion. Neck supple.  Cardiovascular: Normal rate and regular rhythm.   Pulmonary/Chest: Effort normal and breath sounds normal.  Abdominal: Soft. Bowel sounds are normal.          Assessment & Plan:  Follow up in 3 months for platlets Stable HTN GERD stable BPH s/p TURP

## 2011-08-26 DIAGNOSIS — N401 Enlarged prostate with lower urinary tract symptoms: Secondary | ICD-10-CM | POA: Diagnosis not present

## 2011-08-26 DIAGNOSIS — R82998 Other abnormal findings in urine: Secondary | ICD-10-CM | POA: Diagnosis not present

## 2011-10-04 ENCOUNTER — Telehealth: Payer: Self-pay

## 2011-10-04 NOTE — Telephone Encounter (Signed)
Patient called stating he is scheduled to get steroid injections next week (later part of week), patient would like his platelet and b12 levels checked prior to having steroid injection. Patient states he is more tired than usual x 2 weeks or more and wonders if B12 and platelet levels are normal. Patient aware I will discuss this with Dr.Jenkins and call back with response.  Please advise

## 2011-10-04 NOTE — Telephone Encounter (Signed)
Per dr Lovell Sheehan- may have cbc and diff, and b12 level prior to injection-please call pt and schedule-dx is fatigue

## 2011-10-04 NOTE — Telephone Encounter (Signed)
Pt is sch for 10-07-2011 10am

## 2011-10-07 ENCOUNTER — Other Ambulatory Visit (INDEPENDENT_AMBULATORY_CARE_PROVIDER_SITE_OTHER): Payer: Medicare Other

## 2011-10-07 DIAGNOSIS — D473 Essential (hemorrhagic) thrombocythemia: Secondary | ICD-10-CM

## 2011-10-07 DIAGNOSIS — E538 Deficiency of other specified B group vitamins: Secondary | ICD-10-CM | POA: Diagnosis not present

## 2011-10-07 LAB — CBC WITH DIFFERENTIAL/PLATELET
Basophils Relative: 0.6 % (ref 0.0–3.0)
Eosinophils Absolute: 0.2 10*3/uL (ref 0.0–0.7)
Hemoglobin: 14.6 g/dL (ref 13.0–17.0)
MCHC: 33.6 g/dL (ref 30.0–36.0)
MCV: 89 fl (ref 78.0–100.0)
Monocytes Absolute: 0.4 10*3/uL (ref 0.1–1.0)
Neutro Abs: 3 10*3/uL (ref 1.4–7.7)
Neutrophils Relative %: 54.9 % (ref 43.0–77.0)
RBC: 4.9 Mil/uL (ref 4.22–5.81)
RDW: 13.4 % (ref 11.5–14.6)

## 2011-10-10 ENCOUNTER — Telehealth: Payer: Self-pay

## 2011-10-10 NOTE — Telephone Encounter (Signed)
Pt called requesting lab results.  Pt would like a call back and to pick up rx.

## 2011-10-10 NOTE — Telephone Encounter (Signed)
Pt informed wnl except platelet which are higher than normal- copy up front

## 2011-10-25 ENCOUNTER — Other Ambulatory Visit: Payer: Self-pay | Admitting: *Deleted

## 2011-10-25 MED ORDER — SIMVASTATIN 20 MG PO TABS: 20.0000 mg | ORAL_TABLET | Freq: Two times a day (BID) | ORAL | Status: DC

## 2011-10-25 MED ORDER — RABEPRAZOLE SODIUM 20 MG PO TBEC: 20.0000 mg | DELAYED_RELEASE_TABLET | Freq: Two times a day (BID) | ORAL | Status: DC

## 2011-10-25 MED ORDER — SIMVASTATIN 20 MG PO TABS: ORAL_TABLET | ORAL | Status: DC

## 2011-10-28 ENCOUNTER — Other Ambulatory Visit: Payer: Medicare Other

## 2011-10-28 DIAGNOSIS — M25559 Pain in unspecified hip: Secondary | ICD-10-CM | POA: Diagnosis not present

## 2011-10-29 DIAGNOSIS — M25559 Pain in unspecified hip: Secondary | ICD-10-CM | POA: Diagnosis not present

## 2011-11-04 ENCOUNTER — Ambulatory Visit: Payer: Medicare Other | Admitting: Internal Medicine

## 2011-11-05 ENCOUNTER — Ambulatory Visit: Payer: Medicare Other | Admitting: Internal Medicine

## 2011-11-19 ENCOUNTER — Ambulatory Visit (INDEPENDENT_AMBULATORY_CARE_PROVIDER_SITE_OTHER): Payer: Medicare Other | Admitting: Internal Medicine

## 2011-11-19 ENCOUNTER — Encounter: Payer: Self-pay | Admitting: Internal Medicine

## 2011-11-19 VITALS — BP 120/60 | HR 68 | Temp 98.6°F | Resp 16 | Ht 68.0 in | Wt 150.0 lb

## 2011-11-19 DIAGNOSIS — R141 Gas pain: Secondary | ICD-10-CM

## 2011-11-19 DIAGNOSIS — K219 Gastro-esophageal reflux disease without esophagitis: Secondary | ICD-10-CM

## 2011-11-19 DIAGNOSIS — R142 Eructation: Secondary | ICD-10-CM | POA: Diagnosis not present

## 2011-11-19 DIAGNOSIS — R140 Abdominal distension (gaseous): Secondary | ICD-10-CM

## 2011-11-19 NOTE — Patient Instructions (Addendum)
The patient is instructed to continue all medications as prescribed. Schedule followup with check out clerk upon leaving the clinic  

## 2011-11-19 NOTE — Progress Notes (Signed)
Subjective:    Patient ID: Corey Fischer, male    DOB: 10-28-1931, 78 y.o.   MRN: [id number]  HPI Stable platelets Stable blood pressure Monitoring of  Anemia reveiwed b12 deficiency     Review of Systems  Constitutional: Negative for fever and fatigue.  HENT: Negative for hearing loss, congestion, neck pain and postnasal drip.   Eyes: Negative for discharge, redness and visual disturbance.  Respiratory: Negative for cough, shortness of breath and wheezing.   Cardiovascular: Negative for leg swelling.  Gastrointestinal: Negative for abdominal pain, constipation and abdominal distention.  Genitourinary: Negative for urgency and frequency.  Musculoskeletal: Negative for joint swelling and arthralgias.  Skin: Negative for color change and rash.  Neurological: Negative for weakness and light-headedness.  Hematological: Negative for adenopathy.  Psychiatric/Behavioral: Negative for behavioral problems.   Past Medical History  Diagnosis Date  . Chronic ITP (idiopathic thrombocytopenia)   . Hx of adenomatous colonic polyps 2010  . Diverticulosis   . GERD (gastroesophageal reflux disease)   . Allergy   . Hyperlipidemia   . Hypertension   . Elevated PSA   . Anxiety   . Depression   . Stricture and stenosis of esophagus   . Gastritis   . Hemorrhoids   . History of hiatal hernia   . Raynaud's syndrome   . Gastric polyp   . Asymptomatic bilateral carotid artery stenosis per duplex 05-26-2007--  bil. ica 40-59%  . Constipation   . Head cold   . Arthritis   . Osteoarthritis hips  . BPH (benign prostatic hyperplasia)   . Nocturia   . B12 deficiency     History   Social History  . Marital Status: Married    Spouse Name: N/A    Number of Children: 2  . Years of Education: N/A   Occupational History  . retired    Social History Main Topics  . Smoking status: Never Smoker   . Smokeless tobacco: Never Used  . Alcohol Use: Yes     occasionally  . Drug Use: No  .  Sexually Active: Yes   Other Topics Concern  . Not on file   Social History Narrative  . No narrative on file    Past Surgical History  Procedure Date  . Rotator cuff repair 1988    RIGHT  . Tonsillectomy age 49  . Appendectomy age 71  . Laparoscopic inguinal hernia repair 09-19-2010    BILATERAL  . Left shoulder surg. 2007  . Cholecystectomy 1989  . Carotid duplex 05-26-2007    BILATERAL ICA  40-59%  . Transurethral resection of prostate 07/22/2011    Procedure: TRANSURETHRAL RESECTION OF THE PROSTATE WITH GYRUS INSTRUMENTS;  Surgeon: Valetta Fuller, MD;  Location: [facility];  Service: Urology;  Laterality: N/A;  Saline Gyrus TURP  1 hour requested for this procedure  GYRUS    Family History  Problem Relation Age of Onset  . Heart disease Mother   . Heart disease Father   . Colon cancer Neg Hx   . Colon polyps Mother     No Known Allergies  Current Outpatient Prescriptions on File Prior to Visit  Medication Sig Dispense Refill  . alfuzosin (UROXATRAL) 10 MG 24 hr tablet Take 10 mg by mouth daily.       [name] amLODipine (NORVASC) 2.5 MG tablet       . Ascorbic Acid (VITAMIN C) 1000 MG tablet Take 1,000 mg by mouth daily.       [name]  cetirizine (ZYRTEC) 10 MG tablet Take 10 mg by mouth daily.       . Cholecalciferol (VITAMIN D3) 1000 UNITS CAPS Take 2,000 Units by mouth daily.       . Coral Calcium 1000 (390 CA) MG TABS Take by mouth.       . Cyanocobalamin 500 MCG/0.1ML SOLN every 7 (seven) days. 1 spray in nostril every week      . dicyclomine (BENTYL) 10 MG capsule Take 1 -2 capsules every 4 to 6 hours as needed for pain  60 capsule  3  . dutasteride (AVODART) 0.5 MG capsule Take 1 capsule (0.5 mg total) by mouth daily.  90 capsule  3  . Multiple Vitamins-Minerals (ZINC PO) Take 20 mg by mouth daily.      . polyethylene glycol (MIRALAX / GLYCOLAX) packet Take 17 g by mouth as needed.      . RABEprazole (ACIPHEX) 20 MG tablet Take 1 tablet (20 mg total) by  mouth 2 (two) times daily. 2 daily  180 tablet  3  . senna (SENOKOT) 8.6 MG tablet Take 1 tablet by mouth as needed.      . simvastatin (ZOCOR) 20 MG tablet Take 2 a day and this is correct  180 tablet  3    BP 120/60  Pulse 68  Temp 98.6 F (37 C)  Resp 16  Ht 5\' 8"  (1.727 m)  Wt 150 lb (68.04 kg)  BMI 22.81 kg/m2       Objective:   Physical Exam  Nursing note and vitals reviewed. Constitutional: He appears well-developed and well-nourished.  HENT:  Head: Normocephalic and atraumatic.  Eyes: Conjunctivae are normal. Pupils are equal, round, and reactive to light.  Neck: Normal range of motion. Neck supple.  Cardiovascular: Normal rate and regular rhythm.   Pulmonary/Chest: Effort normal and breath sounds normal.  Abdominal: Soft. Bowel sounds are normal.    Tenderness at site of hernia repair      Assessment & Plan:  Stable anemia with stable platlets Stable blood pressure Zocor side effects  Discussed Prostate flow variable and has an appointment with Dr Manon Hilding in 5 months

## 2011-12-26 DIAGNOSIS — N401 Enlarged prostate with lower urinary tract symptoms: Secondary | ICD-10-CM | POA: Diagnosis not present

## 2011-12-26 DIAGNOSIS — N529 Male erectile dysfunction, unspecified: Secondary | ICD-10-CM | POA: Diagnosis not present

## 2012-01-10 ENCOUNTER — Other Ambulatory Visit: Payer: Self-pay | Admitting: Internal Medicine

## 2012-01-31 ENCOUNTER — Ambulatory Visit (INDEPENDENT_AMBULATORY_CARE_PROVIDER_SITE_OTHER): Payer: Medicare Other | Admitting: Internal Medicine

## 2012-01-31 ENCOUNTER — Encounter: Payer: Self-pay | Admitting: Internal Medicine

## 2012-01-31 VITALS — BP 110/60 | HR 60 | Ht 66.5 in | Wt 147.4 lb

## 2012-01-31 DIAGNOSIS — R109 Unspecified abdominal pain: Secondary | ICD-10-CM | POA: Diagnosis not present

## 2012-01-31 DIAGNOSIS — K219 Gastro-esophageal reflux disease without esophagitis: Secondary | ICD-10-CM | POA: Diagnosis not present

## 2012-01-31 DIAGNOSIS — K5900 Constipation, unspecified: Secondary | ICD-10-CM

## 2012-01-31 DIAGNOSIS — K649 Unspecified hemorrhoids: Secondary | ICD-10-CM | POA: Diagnosis not present

## 2012-01-31 DIAGNOSIS — G8929 Other chronic pain: Secondary | ICD-10-CM

## 2012-01-31 MED ORDER — HYDROCORTISONE ACETATE 25 MG RE SUPP: 25.0000 mg | Freq: Two times a day (BID) | RECTAL | Status: DC

## 2012-01-31 MED ORDER — HYDROCORTISONE ACETATE 25 MG RE SUPP: 25.0000 mg | Freq: Every evening | RECTAL | Status: AC | PRN

## 2012-01-31 NOTE — Progress Notes (Signed)
HISTORY OF PRESENT ILLNESS:  Corey Fischer is a 78 y.o. male with the below listed medical history who is followed in this office for GERD, chronic functional abdominal pain, constipation, and colon polyps. He presents today regarding symptomatic hemorrhoids in 6 weeks' duration. He has been using a Analpram. Hemorrhoids protrude and bleeding minimally. No pain. Prior colonoscopy demonstrated internal hemorrhoids. No problems with GERD on PPI. No problems with abdominal pain since last endoscopic evaluation. Constipation is managed with MiraLax  REVIEW OF SYSTEMS:  All non-GI ROS negative   Past Medical History  Diagnosis Date  . Chronic ITP (idiopathic thrombocytopenia)   . Hx of adenomatous colonic polyps 2010  . Diverticulosis   . GERD (gastroesophageal reflux disease)   . Allergy   . Hyperlipidemia   . Hypertension   . Elevated PSA   . Anxiety   . Depression   . Stricture and stenosis of esophagus   . Gastritis   . Hemorrhoids   . History of hiatal hernia   . Raynaud's syndrome   . Gastric polyp   . Asymptomatic bilateral carotid artery stenosis per duplex 05-26-2007--  bil. ica 40-59%  . Constipation   . Head cold   . Arthritis   . Osteoarthritis hips  . BPH (benign prostatic hyperplasia)   . Nocturia   . B12 deficiency     Past Surgical History  Procedure Date  . Rotator cuff repair 1988    RIGHT  . Tonsillectomy age 40  . Appendectomy age 87  . Laparoscopic inguinal hernia repair 09-19-2010    BILATERAL  . Left shoulder surg. 2007  . Cholecystectomy 1989  . Carotid duplex 05-26-2007    BILATERAL ICA  40-59%  . Transurethral resection of prostate 07/22/2011    Procedure: TRANSURETHRAL RESECTION OF THE PROSTATE WITH GYRUS INSTRUMENTS;  Surgeon: Valetta Fuller, MD;  Location: [facility];  Service: Urology;  Laterality: N/A;  Saline Gyrus TURP  1 hour requested for this procedure  GYRUS    Social History Corey Fischer  reports that he has never  smoked. He has never used smokeless tobacco. He reports that he drinks alcohol. He reports that he does not use illicit drugs.  family history includes Colon polyps in his mother and Heart disease in his father and mother.  There is no history of Colon cancer.  No Known Allergies     PHYSICAL EXAMINATION: Vital signs: BP 110/60  Pulse 60  Ht 5' 6.5" (1.689 m)  Wt 147 lb 6.4 oz (66.86 kg)  BMI 23.43 kg/m2  Constitutional: generally well-appearing, no acute distress Psychiatric: alert and oriented x3, cooperative Rectal:small external and internal hemorrhoids. The tenderness. Hemoccult negative Neuro: alert and oriented.     ASSESSMENT:  #1. Hemorrhoids #2. Chronic functional abdominal pain #3. Functional constipation #4. History of colon polyps. Last colonoscopy 2010 #5. GERD. No symptoms on PPI  PLAN:  #1. Analpram cream prescribed. Samples given. Drug co-pay card given. This too expensive, can try Anusol suppositories #2. Continue MiraLax as needed #3. Continue PPI as needed #4. Surveillance colonoscopy 2015

## 2012-01-31 NOTE — Patient Instructions (Addendum)
We have given you a sample of Analpram to try.  We have also sent a prescription for Anusol suppositories to your pharmacy

## 2012-03-17 DIAGNOSIS — H43819 Vitreous degeneration, unspecified eye: Secondary | ICD-10-CM | POA: Diagnosis not present

## 2012-03-17 DIAGNOSIS — H3560 Retinal hemorrhage, unspecified eye: Secondary | ICD-10-CM | POA: Diagnosis not present

## 2012-03-17 DIAGNOSIS — H2510 Age-related nuclear cataract, unspecified eye: Secondary | ICD-10-CM | POA: Diagnosis not present

## 2012-03-31 DIAGNOSIS — H04129 Dry eye syndrome of unspecified lacrimal gland: Secondary | ICD-10-CM | POA: Diagnosis not present

## 2012-03-31 DIAGNOSIS — H01009 Unspecified blepharitis unspecified eye, unspecified eyelid: Secondary | ICD-10-CM | POA: Diagnosis not present

## 2012-03-31 DIAGNOSIS — H16149 Punctate keratitis, unspecified eye: Secondary | ICD-10-CM | POA: Diagnosis not present

## 2012-03-31 DIAGNOSIS — H2510 Age-related nuclear cataract, unspecified eye: Secondary | ICD-10-CM | POA: Diagnosis not present

## 2012-04-14 DIAGNOSIS — M169 Osteoarthritis of hip, unspecified: Secondary | ICD-10-CM | POA: Diagnosis not present

## 2012-04-21 DIAGNOSIS — M25559 Pain in unspecified hip: Secondary | ICD-10-CM | POA: Diagnosis not present

## 2012-05-04 ENCOUNTER — Ambulatory Visit (INDEPENDENT_AMBULATORY_CARE_PROVIDER_SITE_OTHER): Payer: Medicare Other | Admitting: Internal Medicine

## 2012-05-04 VITALS — BP 126/76 | HR 60 | Temp 97.9°F | Resp 16 | Ht 68.0 in | Wt 139.0 lb

## 2012-05-04 DIAGNOSIS — T887XXA Unspecified adverse effect of drug or medicament, initial encounter: Secondary | ICD-10-CM

## 2012-05-04 DIAGNOSIS — I10 Essential (primary) hypertension: Secondary | ICD-10-CM | POA: Diagnosis not present

## 2012-05-04 DIAGNOSIS — D649 Anemia, unspecified: Secondary | ICD-10-CM

## 2012-05-04 DIAGNOSIS — E039 Hypothyroidism, unspecified: Secondary | ICD-10-CM | POA: Diagnosis not present

## 2012-05-04 DIAGNOSIS — E538 Deficiency of other specified B group vitamins: Secondary | ICD-10-CM

## 2012-05-04 DIAGNOSIS — E785 Hyperlipidemia, unspecified: Secondary | ICD-10-CM | POA: Diagnosis not present

## 2012-05-04 LAB — BASIC METABOLIC PANEL
BUN: 20 mg/dL (ref 6–23)
GFR: 65.32 mL/min (ref >60.00)
Glucose, Bld: 101 mg/dL — ABNORMAL HIGH (ref 70–99)
Potassium: 4.2 mEq/L (ref 3.5–5.1)

## 2012-05-04 LAB — CBC WITH DIFFERENTIAL/PLATELET
Basophils Absolute: 0 10*3/uL (ref 0.0–0.1)
Basophils Relative: 0.5 % (ref 0.0–3.0)
Eosinophils Relative: 1.1 % (ref 0.0–5.0)
Hemoglobin: 14.8 g/dL (ref 13.0–17.0)
Lymphocytes Relative: 16.3 % (ref 12.0–46.0)
Monocytes Relative: 7 % (ref 3.0–12.0)
Neutro Abs: 6.1 10*3/uL (ref 1.4–7.7)
RBC: 4.91 Mil/uL (ref 4.22–5.81)

## 2012-05-04 LAB — HEPATIC FUNCTION PANEL
ALT: 40 U/L (ref 0–53)
AST: 22 U/L (ref 0–37)
Bilirubin, Direct: 0 mg/dL (ref 0.0–0.3)
Total Bilirubin: 0.8 mg/dL (ref 0.3–1.2)

## 2012-05-04 LAB — LIPID PANEL
LDL Cholesterol: 64 mg/dL (ref 0–99)
Total CHOL/HDL Ratio: 3

## 2012-05-05 ENCOUNTER — Encounter: Payer: Self-pay | Admitting: Internal Medicine

## 2012-05-31 NOTE — Progress Notes (Signed)
  Subjective:    Patient ID: Corey Fischer, male    DOB: Sep 23, 1931, 79 y.o.   MRN: [id number]  HPI Paper record scanned EMR not avaliable   Review of Systems     Objective:   Physical Exam        Assessment & Plan:

## 2012-06-10 ENCOUNTER — Other Ambulatory Visit (HOSPITAL_COMMUNITY): Payer: Self-pay | Admitting: Cardiovascular Disease

## 2012-06-10 DIAGNOSIS — R002 Palpitations: Secondary | ICD-10-CM

## 2012-06-11 ENCOUNTER — Encounter: Payer: Self-pay | Admitting: Internal Medicine

## 2012-06-11 DIAGNOSIS — R002 Palpitations: Secondary | ICD-10-CM | POA: Diagnosis not present

## 2012-06-15 ENCOUNTER — Other Ambulatory Visit: Payer: Self-pay | Admitting: *Deleted

## 2012-06-15 DIAGNOSIS — M8580 Other specified disorders of bone density and structure, unspecified site: Secondary | ICD-10-CM

## 2012-06-16 NOTE — Telephone Encounter (Signed)
Caller: Shann/Patient; Phone: [phone]; Reason for Call: Patient was returning Bonnye's call from earlier today.  Reviewed EPIC, and see that she was calling regarding question about bone density testing.  Patient reports he has already spoken with someone and scheduled an appointment for the bone density testing.

## 2012-06-17 ENCOUNTER — Ambulatory Visit (HOSPITAL_COMMUNITY): Payer: Medicare Other

## 2012-06-17 ENCOUNTER — Ambulatory Visit (HOSPITAL_COMMUNITY)
Admission: RE | Admit: 2012-06-17 | Discharge: 2012-06-17 | Disposition: A | Discharge status: Home / Self Care | Payer: Medicare Other | Source: Ambulatory Visit | Attending: Cardiovascular Disease | Admitting: Cardiovascular Disease

## 2012-06-17 DIAGNOSIS — I369 Nonrheumatic tricuspid valve disorder, unspecified: Secondary | ICD-10-CM | POA: Insufficient documentation

## 2012-06-17 DIAGNOSIS — I379 Nonrheumatic pulmonary valve disorder, unspecified: Secondary | ICD-10-CM | POA: Insufficient documentation

## 2012-06-17 DIAGNOSIS — R002 Palpitations: Secondary | ICD-10-CM | POA: Diagnosis not present

## 2012-06-17 DIAGNOSIS — I10 Essential (primary) hypertension: Secondary | ICD-10-CM | POA: Diagnosis not present

## 2012-06-17 DIAGNOSIS — I080 Rheumatic disorders of both mitral and aortic valves: Secondary | ICD-10-CM | POA: Insufficient documentation

## 2012-06-17 NOTE — Progress Notes (Signed)
2D Echo Performed 04/27/2013    Neils Siracusa, RCS  

## 2012-06-18 ENCOUNTER — Telehealth: Payer: Self-pay | Admitting: Internal Medicine

## 2012-06-18 NOTE — Telephone Encounter (Signed)
Caller: Ash/Patient; Phone: [phone]; Reason for Call: Patient is returning call from office.  Unsure what this is about ?his Bone Density testing?   He was unable to talk earlier and was in traffic and may have been rude by abruptly hanging up.  Reviewed EPIC. Unable to see a recent note.  Please contact patient back [phone]

## 2012-06-18 NOTE — Telephone Encounter (Signed)
Corey Fischer, did you call Corey Fischer? I can't find anything in the chart, it was not [name] or the schedulers. Sending it to you just in case.

## 2012-06-19 NOTE — Telephone Encounter (Signed)
Spoke with patient.  Unsure of what was needed but patient states he will call back if needed.

## 2012-06-19 NOTE — Telephone Encounter (Signed)
Left message on machine for patient returning his call. 

## 2012-06-23 ENCOUNTER — Ambulatory Visit (INDEPENDENT_AMBULATORY_CARE_PROVIDER_SITE_OTHER)
Admission: RE | Admit: 2012-06-23 | Discharge: 2012-06-23 | Disposition: A | Discharge status: Home / Self Care | Payer: Medicare Other | Source: Ambulatory Visit | Attending: Internal Medicine | Admitting: Internal Medicine

## 2012-06-23 DIAGNOSIS — M8580 Other specified disorders of bone density and structure, unspecified site: Secondary | ICD-10-CM

## 2012-06-23 DIAGNOSIS — M949 Disorder of cartilage, unspecified: Secondary | ICD-10-CM

## 2012-06-23 DIAGNOSIS — M899 Disorder of bone, unspecified: Secondary | ICD-10-CM | POA: Diagnosis not present

## 2012-06-30 ENCOUNTER — Encounter: Payer: Self-pay | Admitting: Internal Medicine

## 2012-07-04 ENCOUNTER — Other Ambulatory Visit: Payer: Self-pay

## 2012-07-07 DIAGNOSIS — I491 Atrial premature depolarization: Secondary | ICD-10-CM | POA: Diagnosis not present

## 2012-07-07 DIAGNOSIS — I739 Peripheral vascular disease, unspecified: Secondary | ICD-10-CM | POA: Diagnosis not present

## 2012-07-08 ENCOUNTER — Encounter: Payer: Self-pay | Admitting: Internal Medicine

## 2012-09-02 ENCOUNTER — Encounter: Payer: Self-pay | Admitting: Internal Medicine

## 2012-09-02 ENCOUNTER — Ambulatory Visit (INDEPENDENT_AMBULATORY_CARE_PROVIDER_SITE_OTHER): Payer: Medicare Other | Admitting: Internal Medicine

## 2012-09-02 VITALS — BP 126/70 | HR 60 | Temp 98.1°F | Resp 14 | Ht 68.0 in | Wt 150.0 lb

## 2012-09-02 DIAGNOSIS — E538 Deficiency of other specified B group vitamins: Secondary | ICD-10-CM

## 2012-09-02 DIAGNOSIS — D473 Essential (hemorrhagic) thrombocythemia: Secondary | ICD-10-CM | POA: Diagnosis not present

## 2012-09-02 DIAGNOSIS — E785 Hyperlipidemia, unspecified: Secondary | ICD-10-CM

## 2012-09-02 LAB — CBC WITH DIFFERENTIAL/PLATELET
Basophils Absolute: 0 10*3/uL (ref 0.0–0.1)
Eosinophils Absolute: 0.1 10*3/uL (ref 0.0–0.7)
HCT: 43.8 % (ref 39.0–52.0)
Lymphs Abs: 1.9 10*3/uL (ref 0.7–4.0)
Monocytes Absolute: 0.5 10*3/uL (ref 0.1–1.0)
Monocytes Relative: 10.1 % (ref 3.0–12.0)
Platelets: 125 10*3/uL — ABNORMAL LOW (ref 150.0–400.0)
RDW: 13 % (ref 11.5–14.6)

## 2012-09-02 NOTE — Progress Notes (Signed)
Subjective:    Patient ID: Corey Fischer, male    DOB: 04-18-1932, 79 y.o.   MRN: [id number]  HPI Monitoring reviewed consults for PVC due to caffene. Not consult with cardiology Echo showed no disease holter report showed pvc and pac    Review of Systems  Constitutional: Negative for fever and fatigue.  HENT: Negative for hearing loss, congestion, neck pain and postnasal drip.   Eyes: Negative for discharge, redness and visual disturbance.  Respiratory: Negative for cough, shortness of breath and wheezing.   Cardiovascular: Negative for leg swelling.  Gastrointestinal: Negative for abdominal pain, constipation and abdominal distention.  Genitourinary: Negative for urgency and frequency.  Musculoskeletal: Negative for joint swelling and arthralgias.  Skin: Negative for color change and rash.  Neurological: Negative for weakness and light-headedness.  Hematological: Negative for adenopathy.  Psychiatric/Behavioral: Negative for behavioral problems.   Past Medical History  Diagnosis Date  . Chronic ITP (idiopathic thrombocytopenia)   . Hx of adenomatous colonic polyps 2010  . Diverticulosis   . GERD (gastroesophageal reflux disease)   . Allergy   . Hyperlipidemia   . Hypertension   . Elevated PSA   . Anxiety   . Depression   . Stricture and stenosis of esophagus   . Gastritis   . Hemorrhoids   . History of hiatal hernia   . Raynaud's syndrome   . Gastric polyp   . Asymptomatic bilateral carotid artery stenosis per duplex 05-26-2007--  bil. ica 40-59%  . Constipation   . Head cold   . Arthritis   . Osteoarthritis hips  . BPH (benign prostatic hyperplasia)   . Nocturia   . B12 deficiency     History   Social History  . Marital Status: Married    Spouse Name: N/A    Number of Children: 2  . Years of Education: N/A   Occupational History  . retired    Social History Main Topics  . Smoking status: Never Smoker   . Smokeless tobacco: Never Used  . Alcohol  Use: Yes     Comment: occasionally  . Drug Use: No  . Sexually Active: Yes   Other Topics Concern  . Not on file   Social History Narrative  . No narrative on file    Past Surgical History  Procedure Laterality Date  . Rotator cuff repair  1988    RIGHT  . Tonsillectomy  age 81  . Appendectomy  age 18  . Laparoscopic inguinal hernia repair  09-19-2010    BILATERAL  . Left shoulder surg.  2007  . Cholecystectomy  1989  . Carotid duplex  05-26-2007    BILATERAL ICA  40-59%  . Transurethral resection of prostate  07/22/2011    Procedure: TRANSURETHRAL RESECTION OF THE PROSTATE WITH GYRUS INSTRUMENTS;  Surgeon: Valetta Fuller, MD;  Location: [facility];  Service: Urology;  Laterality: N/A;  Saline Gyrus TURP  1 hour requested for this procedure  GYRUS    Family History  Problem Relation Age of Onset  . Heart disease Mother   . Heart disease Father   . Colon cancer Neg Hx   . Colon polyps Mother     No Known Allergies  Current Outpatient Prescriptions on File Prior to Visit  Medication Sig Dispense Refill  . amLODipine (NORVASC) 2.5 MG tablet       . Ascorbic Acid (VITAMIN C) 1000 MG tablet Take 1,000 mg by mouth daily.       [name]  cetirizine (ZYRTEC) 10 MG tablet Take 10 mg by mouth daily.       . Cholecalciferol (VITAMIN D3) 1000 UNITS CAPS Take 2,000 Units by mouth daily.       . Coral Calcium 1000 (390 CA) MG TABS Take by mouth.       . Cyanocobalamin 500 MCG/0.1ML SOLN every 7 (seven) days. 1 spray in nostril every week      . hydrocortisone-pramoxine (ANALPRAM-HC) 2.5-1 % rectal cream APPLY RECTALY AT BEDTIME AS NEEDED  28.35 g  11  . Multiple Vitamins-Minerals (ZINC PO) Take 20 mg by mouth daily.      . polyethylene glycol (MIRALAX / GLYCOLAX) packet Take 17 g by mouth daily.       . RABEprazole (ACIPHEX) 20 MG tablet Take 1 tablet (20 mg total) by mouth 2 (two) times daily. 2 daily  180 tablet  3  . senna (SENOKOT) 8.6 MG tablet Take 1 tablet by  mouth as needed.      . simvastatin (ZOCOR) 20 MG tablet Take 2 a day and this is correct  180 tablet  3   No current facility-administered medications on file prior to visit.    BP 126/70  Pulse 60  Temp(Src) 98.1 F (36.7 C)  Resp 14  Ht 5\' 8"  (1.727 m)  Wt 150 lb (68.04 kg)  BMI 22.81 kg/m2       Objective:   Physical Exam  Nursing note and vitals reviewed. Constitutional: He appears well-developed and well-nourished.  HENT:  Head: Normocephalic and atraumatic.  Eyes: Conjunctivae are normal. Pupils are equal, round, and reactive to light.  Neck: Normal range of motion. Neck supple.  Cardiovascular: Normal rate and regular rhythm.   Pulmonary/Chest: Effort normal and breath sounds normal.  Abdominal: Soft. Bowel sounds are normal.          Assessment & Plan:  PAc and PVC on aEKG with normal echo No intervention Results reveiwed from November CBC with differential for platelet count monitored B12 levels and set up for fasting wellness test in November

## 2012-09-06 ENCOUNTER — Encounter: Payer: Self-pay | Admitting: Internal Medicine

## 2012-09-08 ENCOUNTER — Encounter: Payer: Self-pay | Admitting: Internal Medicine

## 2012-09-08 ENCOUNTER — Other Ambulatory Visit: Payer: Medicare Other

## 2012-09-09 ENCOUNTER — Other Ambulatory Visit (INDEPENDENT_AMBULATORY_CARE_PROVIDER_SITE_OTHER): Payer: Medicare Other

## 2012-09-09 DIAGNOSIS — E538 Deficiency of other specified B group vitamins: Secondary | ICD-10-CM | POA: Diagnosis not present

## 2012-09-09 LAB — VITAMIN B12: Vitamin B-12: 479 pg/mL (ref 211–911)

## 2012-09-11 ENCOUNTER — Telehealth: Payer: Self-pay | Admitting: Internal Medicine

## 2012-09-11 NOTE — Telephone Encounter (Signed)
Pt informed it was 479 and he is taking sublingual pills- will continue

## 2012-09-11 NOTE — Telephone Encounter (Signed)
PT called and stated that he would like to hear from someone regarding his b-12 test. He states that Dr. Caryl Never told him to change the dosage of his medication, but he doesn't quite understand. Please assist.

## 2012-09-23 ENCOUNTER — Emergency Department (INDEPENDENT_AMBULATORY_CARE_PROVIDER_SITE_OTHER)
Admission: EM | Admit: 2012-09-23 | Discharge: 2012-09-23 | Disposition: A | Discharge status: Home / Self Care | Payer: Medicare Other | Source: Home / Self Care | Attending: Emergency Medicine | Admitting: Emergency Medicine

## 2012-09-23 ENCOUNTER — Encounter (HOSPITAL_COMMUNITY): Payer: Self-pay | Admitting: *Deleted

## 2012-09-23 DIAGNOSIS — S01112A Laceration without foreign body of left eyelid and periocular area, initial encounter: Secondary | ICD-10-CM

## 2012-09-23 DIAGNOSIS — S0180XA Unspecified open wound of other part of head, initial encounter: Secondary | ICD-10-CM | POA: Diagnosis not present

## 2012-09-23 NOTE — ED Notes (Signed)
Hit with racquetball racquet above left eye - laceration just below left eyebrow.  No LOC.

## 2012-09-23 NOTE — ED Provider Notes (Signed)
Medical screening examination/treatment/procedure(s) were performed by non-physician practitioner and as supervising physician I was immediately available for consultation/collaboration.  Raynald Blend, MD 09/23/12 1225

## 2012-09-23 NOTE — ED Provider Notes (Signed)
History     CSN: [id number]  Arrival date & time 09/23/12  1015   First MD Initiated Contact with Patient 09/23/12 1138      Chief Complaint  Patient presents with  . Facial Laceration    (Consider location/radiation/quality/duration/timing/severity/associated sxs/prior treatment) Patient is a 79 y.o. male presenting with skin laceration. The history is provided by the patient. No language interpreter was used.  Laceration Location:  Face Facial laceration location:  Face Length (cm):  2 Depth:  Through dermis Quality: straight   Bleeding: controlled   Laceration mechanism:  Blunt object Pain details:    Severity:  Mild Foreign body present:  No foreign bodies Relieved by:  Nothing Tetanus status:  Up to date Pt was hit in the face by a racquet while playing racquet ball  Past Medical History  Diagnosis Date  . Chronic ITP (idiopathic thrombocytopenia)   . Hx of adenomatous colonic polyps 2010  . Diverticulosis   . GERD (gastroesophageal reflux disease)   . Allergy   . Hyperlipidemia   . Hypertension   . Elevated PSA   . Anxiety   . Depression   . Stricture and stenosis of esophagus   . Gastritis   . Hemorrhoids   . History of hiatal hernia   . Raynaud's syndrome   . Gastric polyp   . Asymptomatic bilateral carotid artery stenosis per duplex 05-26-2007--  bil. ica 40-59%  . Constipation   . Head cold   . Arthritis   . Osteoarthritis hips  . BPH (benign prostatic hyperplasia)   . Nocturia   . B12 deficiency     Past Surgical History  Procedure Laterality Date  . Rotator cuff repair  1988    RIGHT  . Tonsillectomy  age 16  . Appendectomy  age 49  . Laparoscopic inguinal hernia repair  09-19-2010    BILATERAL  . Left shoulder surg.  2007  . Cholecystectomy  1989  . Carotid duplex  05-26-2007    BILATERAL ICA  40-59%  . Transurethral resection of prostate  07/22/2011    Procedure: TRANSURETHRAL RESECTION OF THE PROSTATE WITH GYRUS INSTRUMENTS;   Surgeon: Valetta Fuller, MD;  Location: [facility];  Service: Urology;  Laterality: N/A;  Saline Gyrus TURP  1 hour requested for this procedure  GYRUS    Family History  Problem Relation Age of Onset  . Heart disease Mother   . Heart disease Father   . Colon cancer Neg Hx   . Colon polyps Mother     History  Substance Use Topics  . Smoking status: Never Smoker   . Smokeless tobacco: Never Used  . Alcohol Use: Yes     Comment: occasionally      Review of Systems  Skin: Positive for wound.  All other systems reviewed and are negative.    Allergies  Review of patient's allergies indicates no known allergies.  Home Medications   Current Outpatient Rx  Name  Route  Sig  Dispense  Refill  . amLODipine (NORVASC) 2.5 MG tablet               . Ascorbic Acid (VITAMIN C) 1000 MG tablet   Oral   Take 1,000 mg by mouth daily.          . cetirizine (ZYRTEC) 10 MG tablet   Oral   Take 10 mg by mouth daily.          . Cholecalciferol (VITAMIN D3) 1000 UNITS CAPS  Oral   Take 2,000 Units by mouth daily.          [name] co-enzyme Q-10 30 MG capsule   Oral   Take 50 mg by mouth daily.         . Coral Calcium 1000 (390 CA) MG TABS   Oral   Take by mouth.          . Cyanocobalamin 500 MCG/0.1ML SOLN   Sublingual   Place 2,000 mcg under the tongue 1 day or 1 dose. 1 spray in nostril every week         . hydrocortisone-pramoxine (ANALPRAM-HC) 2.5-1 % rectal cream      APPLY RECTALY AT BEDTIME AS NEEDED   28.35 g   11   . polyethylene glycol (MIRALAX / GLYCOLAX) packet   Oral   Take 17 g by mouth daily.          . RABEprazole (ACIPHEX) 20 MG tablet   Oral   Take 1 tablet (20 mg total) by mouth 2 (two) times daily. 2 daily   180 tablet   3   . simvastatin (ZOCOR) 20 MG tablet      Take 2 a day and this is correct   180 tablet   3   . Multiple Vitamins-Minerals (ZINC PO)   Oral   Take 20 mg by mouth daily.         [name]  senna (SENOKOT) 8.6 MG tablet   Oral   Take 1 tablet by mouth as needed.           BP 151/53  Pulse 55  Temp(Src) 98.3 F (36.8 C) (Oral)  Resp 16  SpO2 99%  Physical Exam  Nursing note and vitals reviewed. Constitutional: He appears well-developed and well-nourished.  HENT:  Head: Normocephalic.  Right Ear: External ear normal.  Left Ear: External ear normal.  2cm laceration left eyebrow/eyelid area  Eyes: Conjunctivae and EOM are normal. Pupils are equal, round, and reactive to light.  Neck: Normal range of motion.  Pulmonary/Chest: Effort normal.  Musculoskeletal: Normal range of motion.  Neurological: He is alert.  Skin: Skin is warm.    ED Course  LACERATION REPAIR Date/Time: 09/23/2012 11:56 AM Performed by: Elson Areas Authorized by: Jimmie Molly Consent: Verbal consent obtained. Risks and benefits: risks, benefits and alternatives were discussed Consent given by: patient Required items: required blood products, implants, devices, and special equipment available Patient identity confirmed: verbally with patient Body area: head/neck Laceration length: 2 cm Tendon involvement: none Nerve involvement: none Anesthesia: local infiltration Preparation: Patient was prepped and draped in the usual sterile fashion. Irrigation solution: saline Skin closure: 6-0 Prolene Number of sutures: 4 Technique: simple Approximation: close Approximation difficulty: simple Patient tolerance: Patient tolerated the procedure well with no immediate complications.   (including critical care time)  Labs Reviewed - No data to display No results found.   1. Laceration of eyebrow without complication, left, initial encounter       MDM  Suture removal in 6 days.       Lonia Skinner [place], PA-C 09/23/12 1157

## 2012-09-29 ENCOUNTER — Emergency Department (INDEPENDENT_AMBULATORY_CARE_PROVIDER_SITE_OTHER)
Admission: EM | Admit: 2012-09-29 | Discharge: 2012-09-29 | Disposition: A | Discharge status: Home / Self Care | Payer: Medicare Other | Source: Home / Self Care | Attending: Emergency Medicine | Admitting: Emergency Medicine

## 2012-09-29 ENCOUNTER — Encounter (HOSPITAL_COMMUNITY): Payer: Self-pay

## 2012-09-29 DIAGNOSIS — Z4802 Encounter for removal of sutures: Secondary | ICD-10-CM | POA: Diagnosis not present

## 2012-09-29 NOTE — ED Notes (Signed)
Wound recheck; hit w racket during racquetball game 5-7; no redness, signs of infection absent ; inferior orbital  ecchymosis present

## 2012-09-29 NOTE — ED Provider Notes (Signed)
History     CSN: [id number]  Arrival date & time 09/29/12  1118   First MD Initiated Contact with Patient 09/29/12 1218      No chief complaint on file.   (Consider location/radiation/quality/duration/timing/severity/associated sxs/prior treatment) Patient is a 78 y.o. male presenting with wound check. The history is provided by the patient. No language interpreter was used.  Wound Check This is a new problem.  Pt sutured here by me on 5/7.  Pt denies any complaints  Past Medical History  Diagnosis Date  . Chronic ITP (idiopathic thrombocytopenia)   . Hx of adenomatous colonic polyps 2010  . Diverticulosis   . GERD (gastroesophageal reflux disease)   . Allergy   . Hyperlipidemia   . Hypertension   . Elevated PSA   . Anxiety   . Depression   . Stricture and stenosis of esophagus   . Gastritis   . Hemorrhoids   . History of hiatal hernia   . Raynaud's syndrome   . Gastric polyp   . Asymptomatic bilateral carotid artery stenosis per duplex 05-26-2007--  bil. ica 40-59%  . Constipation   . Head cold   . Arthritis   . Osteoarthritis hips  . BPH (benign prostatic hyperplasia)   . Nocturia   . B12 deficiency     Past Surgical History  Procedure Laterality Date  . Rotator cuff repair  1988    RIGHT  . Tonsillectomy  age 39  . Appendectomy  age 89  . Laparoscopic inguinal hernia repair  09-19-2010    BILATERAL  . Left shoulder surg.  2007  . Cholecystectomy  1989  . Carotid duplex  05-26-2007    BILATERAL ICA  40-59%  . Transurethral resection of prostate  07/22/2011    Procedure: TRANSURETHRAL RESECTION OF THE PROSTATE WITH GYRUS INSTRUMENTS;  Surgeon: Valetta Fuller, MD;  Location: [facility];  Service: Urology;  Laterality: N/A;  Saline Gyrus TURP  1 hour requested for this procedure  GYRUS    Family History  Problem Relation Age of Onset  . Heart disease Mother   . Heart disease Father   . Colon cancer Neg Hx   . Colon polyps Mother      History  Substance Use Topics  . Smoking status: Never Smoker   . Smokeless tobacco: Never Used  . Alcohol Use: Yes     Comment: occasionally      Review of Systems  All other systems reviewed and are negative.    Allergies  Review of patient's allergies indicates no known allergies.  Home Medications   Current Outpatient Rx  Name  Route  Sig  Dispense  Refill  . amLODipine (NORVASC) 2.5 MG tablet               . Ascorbic Acid (VITAMIN C) 1000 MG tablet   Oral   Take 1,000 mg by mouth daily.          . cetirizine (ZYRTEC) 10 MG tablet   Oral   Take 10 mg by mouth daily.          . Cholecalciferol (VITAMIN D3) 1000 UNITS CAPS   Oral   Take 2,000 Units by mouth daily.          [name] co-enzyme Q-10 30 MG capsule   Oral   Take 50 mg by mouth daily.         . Coral Calcium 1000 (390 CA) MG TABS   Oral   Take  by mouth.          . Cyanocobalamin 500 MCG/0.1ML SOLN   Sublingual   Place 2,000 mcg under the tongue 1 day or 1 dose. 1 spray in nostril every week         . hydrocortisone-pramoxine (ANALPRAM-HC) 2.5-1 % rectal cream      APPLY RECTALY AT BEDTIME AS NEEDED   28.35 g   11   . Multiple Vitamins-Minerals (ZINC PO)   Oral   Take 20 mg by mouth daily.         . polyethylene glycol (MIRALAX / GLYCOLAX) packet   Oral   Take 17 g by mouth daily.          . RABEprazole (ACIPHEX) 20 MG tablet   Oral   Take 1 tablet (20 mg total) by mouth 2 (two) times daily. 2 daily   180 tablet   3   . senna (SENOKOT) 8.6 MG tablet   Oral   Take 1 tablet by mouth as needed.         . simvastatin (ZOCOR) 20 MG tablet      Take 2 a day and this is correct   180 tablet   3     BP 144/67  Pulse 56  Temp(Src) 98.5 F (36.9 C) (Oral)  Resp 17  SpO2 97%  Physical Exam  Nursing note and vitals reviewed. Constitutional: He appears well-developed and well-nourished.  HENT:  Head: Normocephalic.  Well healed laceration eyelid   Neurological: He is alert.  Skin: Skin is warm.    ED Course  Procedures (including critical care time)  Labs Reviewed - No data to display No results found.   No diagnosis found.    MDM  Sutures removed       Elson Areas, PA-C 09/29/12 1231

## 2012-10-04 NOTE — ED Provider Notes (Signed)
Medical screening examination/treatment/procedure(s) were performed by non-physician practitioner and as supervising physician I was immediately available for consultation/collaboration.  Leslee Home, M.D.  Reuben Likes, MD 10/04/12 [phone]

## 2012-10-05 ENCOUNTER — Telehealth: Payer: Self-pay | Admitting: Internal Medicine

## 2012-10-05 NOTE — Telephone Encounter (Signed)
[facility] Triage Call Report Triage Record Num: [id number] Operator: Caswell Corwin Patient Name: Corey Fischer Call Date & Time: 10/03/2012 8:08:27PM Patient Phone: [phone] PCP: Darryll Capers Patient Gender: Male PCP Fax : [phone] Patient DOB: 1931-10-09 Practice Name: [facility]  Reason for Call: Caller: [name]; PCP: Darryll Capers (Adults only); CB#: [phone]; Call regarding right pain in foot on the bottom of the foot from the heal to ball of his foot. It is a sharp pain on the lateral underside of the foot. Triaged Foot Non-Injury and it does not better with elevation. All emergent SX R/O. Disposition is provide home care, but pt states he does not think he can sleep with this pain. He will try some Motrin and suggested and ace bandage may help with support until he gets it evlauated. Instructed will need to go to the ED for evaluation if it continues. Will go to [facility] if he feels he needs to.  Protocol(s) Used: Foot Non-Injury Recommended Outcome per Protocol: Provide Home/Self Care Reason for Outcome: All other situations Care Advice: Wear comfortable, well-fitting shoes. Avoid wearing shoes with narrow toes or high heels. A good shoe choice is a well-fitted, padded walking shoe with wide toe box and arch supports. ~ ~ See provider if symptoms continue for 7 days with home care. 05/

## 2012-10-16 ENCOUNTER — Encounter: Payer: Self-pay | Admitting: Internal Medicine

## 2012-10-16 ENCOUNTER — Ambulatory Visit (INDEPENDENT_AMBULATORY_CARE_PROVIDER_SITE_OTHER): Payer: Medicare Other | Admitting: Cardiology

## 2012-10-16 ENCOUNTER — Encounter: Payer: Self-pay | Admitting: Cardiology

## 2012-10-16 VITALS — BP 122/52 | HR 56 | Ht 67.0 in | Wt 150.0 lb

## 2012-10-16 DIAGNOSIS — I491 Atrial premature depolarization: Secondary | ICD-10-CM | POA: Diagnosis not present

## 2012-10-16 DIAGNOSIS — I7300 Raynaud's syndrome without gangrene: Secondary | ICD-10-CM

## 2012-10-16 DIAGNOSIS — I10 Essential (primary) hypertension: Secondary | ICD-10-CM

## 2012-10-16 DIAGNOSIS — R002 Palpitations: Secondary | ICD-10-CM | POA: Insufficient documentation

## 2012-10-16 LAB — MAGNESIUM: Magnesium: 2.1 mg/dL (ref 1.5–2.5)

## 2012-10-16 LAB — BASIC METABOLIC PANEL
Calcium: 9.5 mg/dL (ref 8.4–10.5)
Chloride: 103 mEq/L (ref 96–112)
Creat: 1.31 mg/dL (ref 0.50–1.35)

## 2012-10-16 MED ORDER — DILTIAZEM HCL 30 MG PO TABS: 30.0000 mg | ORAL_TABLET | Freq: Two times a day (BID) | ORAL | Status: DC

## 2012-10-16 NOTE — Patient Instructions (Signed)
Stop amlodipine. Begin cardizem 30 mg twice a day.  If premature beats do not resolve call and we will increase to every 8 hours. If you feel weak or tired, dizzy call us.   I will have you follow up with Dr. Allyson Sabal in 3-4 weeks and decide at that time dose of long acting cardizem.

## 2012-10-16 NOTE — Assessment & Plan Note (Signed)
stable °

## 2012-10-16 NOTE — Progress Notes (Signed)
[facility]  10/16/2012   PCP: [name], MD   Chief Complaint  Patient presents with  . Palpitations    has been occuring 3-4 days    Primary Cardiologist: Dr. Allyson Sabal  HPI: 79 year old WMM patient was seen by Dr. Allyson Sabal on January 22nd secondary to symptomatic PVCs. He has a history of hypertension and hyperlipidemia. He is quite active and works out frequently. He was having more PVCs in January; he has had them consistently since graduate school, more so when he is stressed. He was on propranolol remotely by Dr. Glennon Hamilton. Over the 2 months prior to seeing Dr. Allyson Sabal he had progressive PVCs. He had lab work revealing a normal TSH. He also relates that his anxiety level has been higher, that if people follow too closely or get in his space he becomes quite angry, and he feels this may aggravate the premature beats. When Dr. Allyson Sabal saw him his EKG was stable. The patient has no chest pain at all. He ordered an echo and a 2-week monitor which does reveal occasional PVC and more frequent PACs. The 2D echo EF was 55-60% with grade 1 diastolic dysfunction. The aortic valve was sclerotic without stenosis, trivial regurgitation, mitral valve with mildly thickened leaflets without frank prolapse, moderate tricuspid regurgitation. Pulmonary artery pressure was 32 mmHg.  at that time he stopped his caffeine completely and his symptoms very much resolved.  He has not been put on anti-anxiety medication.  Today he asked to be seen secondary to increasing episodes of these palpitations that is causing discomfort in his head that he also feels palpitations in his chest. He is taking no caffeine. When he is exercising he has no symptoms at all. Denies chest pain or shortness of breath is able to exercise without any complications. His exercise does include spin class.  It does seem that stress increases his palpitations. Previously he has had a sleep study which was  negative. He does have jerking initially when he first goes to sleep his wife tells me.  On EKG today he is in sinus bradycardia 56 beats per minute with frequent PACs he was having the same symptoms when the EKG was done so these are PACs causing his symptoms at this point.   Previously when he wore a monitor for 2 weeks in January he did have a heart rate in the 40s at sleep.     I discussed this briefly with Dr. Royann Shivers and we will change his amlodipine to Cardizem. I will start a low dose 30 mg twice a day because of his underlying heart rates only 56. If this helps we could increase Cardizem to every 8 hours and when he sees Dr. Allyson Sabal back he can be put on a long acting Cardizem.      Allergies  Allergen Reactions  . Ciprofloxacin     Current Outpatient Prescriptions  Medication Sig Dispense Refill  . Ascorbic Acid (VITAMIN C) 1000 MG tablet Take 1,000 mg by mouth daily.       [name] aspirin 81 MG tablet Take 81 mg by mouth daily.      . cetirizine (ZYRTEC) 10 MG tablet Take 10 mg by mouth daily.       . Cholecalciferol (VITAMIN D3) 1000 UNITS CAPS Take 2,000 Units by mouth daily.       [name] co-enzyme Q-10 30 MG capsule Take 50 mg by mouth daily.      . Coral Calcium 1000 (390 CA)  MG TABS Take by mouth.       . Cyanocobalamin 500 MCG/0.1ML SOLN Place 2,000 mcg under the tongue 1 day or 1 dose. 1 spray in nostril every week      . hydrocortisone-pramoxine (ANALPRAM-HC) 2.5-1 % rectal cream APPLY RECTALY AT BEDTIME AS NEEDED  28.35 g  11  . polyethylene glycol (MIRALAX / GLYCOLAX) packet Take 17 g by mouth daily.       . RABEprazole (ACIPHEX) 20 MG tablet Take 1 tablet (20 mg total) by mouth 2 (two) times daily. 2 daily  180 tablet  3  . senna (SENOKOT) 8.6 MG tablet Take 1 tablet by mouth as needed.      . simvastatin (ZOCOR) 20 MG tablet Take 2 a day and this is correct  180 tablet  3  . diltiazem (CARDIZEM) 30 MG tablet Take 1 tablet (30 mg total) by mouth 2 (two) times daily.  60 tablet   6   No current facility-administered medications for this visit.    Past Medical History  Diagnosis Date  . Chronic ITP (idiopathic thrombocytopenia)   . Hx of adenomatous colonic polyps 2010  . Diverticulosis   . GERD (gastroesophageal reflux disease)   . Allergy   . Hyperlipidemia   . Hypertension   . Elevated PSA   . Anxiety   . Depression   . Stricture and stenosis of esophagus   . Gastritis   . Hemorrhoids   . History of hiatal hernia   . Raynaud's syndrome   . Gastric polyp   . Asymptomatic bilateral carotid artery stenosis per duplex 05-26-2007--  bil. ica 40-59%  . Constipation   . Head cold   . Arthritis   . Osteoarthritis hips  . BPH (benign prostatic hyperplasia)   . Nocturia   . B12 deficiency   . Palpitations     monitor 05/2012- NSR with symptomatic PVC; echo 06-17-12- EF 55-60%aoric sclerosis without stenosis,     Past Surgical History  Procedure Laterality Date  . Rotator cuff repair  1988    RIGHT  . Tonsillectomy  age 89  . Appendectomy  age 77  . Laparoscopic inguinal hernia repair  09-19-2010    BILATERAL  . Left shoulder surg.  2007  . Cholecystectomy  1989  . Carotid duplex  05-26-2007    BILATERAL ICA  40-59%  . Transurethral resection of prostate  07/22/2011    Procedure: TRANSURETHRAL RESECTION OF THE PROSTATE WITH GYRUS INSTRUMENTS;  Surgeon: Valetta Fuller, MD;  Location: [facility];  Service: Urology;  Laterality: N/A;  Saline Gyrus TURP  1 hour requested for this procedure  GYRUS    ZOX:WRUEAVW:UJ colds or fevers, no weight changes Skin:no rashes or ulcers HEENT:no blurred vision, no congestion CV:see HPI PUL:see HPI GI:no diarrhea constipation or melena, no indigestion GU:no hematuria, no dysuria MS:no joint pain, no claudication Neuro:no syncope, no lightheadedness, just a funny feeling in his head with the Palpatations Endo:no diabetes, no thyroid disease Psych:  He seems to still have some anger/anxiety  issues are and may still benefit from seeing primary care for treatment  PHYSICAL EXAM BP 122/52  Pulse 56  Ht 5\' 7"  (1.702 m)  Wt 150 lb (68.04 kg)  BMI 23.49 kg/m2 General:Pleasant affect, NAD Skin:Warm and dry, brisk capillary refill HEENT:normocephalic, sclera clear, mucus membranes moist Neck:supple, no JVD, no bruits  Heart:S1S2 RRR without murmur, gallup, rub or click Lungs:clear without rales, rhonchi, or wheezes WJX:BJYN, non tender, + BS, do  not palpate liver spleen or masses Ext:no lower ext edema, 2+ pedal pulses, 2+ radial pulses Neuro:alert and oriented, MAE, follows commands, + facial symmetry  EKG: Sinus bradycardia with rate 58 though towards the end of EKG the rate does drop to 50 does have PACs no acute changes otherwise from previous EKGs .  ASSESSMENT AND PLAN PAC (premature atrial contraction), symptomatic with palpatataions Discussed with Dr. Royann Shivers and even with HR in the 40s at night he feels changing the norvasc to cardizem would prevent the PACs and his symptoms.  I used short acting cardizem in case he does becomes more bradycardic.  If this does not help he may need medication from primary MD for anxiety/anger issues.   His echo is the recent past is normal.  Last TSH was normal. I will check BMP and Magnesium.    HYPERTENSION stable   He will follow up with Dr. Allyson Sabal in several weeks.  If need we could increase to 30 mg every 8 hours if they seem to help palpitations.

## 2012-10-16 NOTE — Assessment & Plan Note (Deleted)
Will need to monitor if worsening of symptoms

## 2012-10-16 NOTE — Assessment & Plan Note (Signed)
Discussed with Dr. Royann Shivers and even with HR in the 40s at night he feels changing the norvasc to cardizem would prevent the PACs and his symptoms.  I used short acting cardizem in case he does becomes more bradycardic.  If this does not help he may need medication from primary MD for anxiety/anger issues.   His echo is the recent past is normal.  Last TSH was normal. I will check BMP and Magnesium.

## 2012-10-19 ENCOUNTER — Telehealth: Payer: Self-pay | Admitting: Cardiovascular Disease

## 2012-10-19 NOTE — Telephone Encounter (Signed)
Returned call.  Pt stated he was in Friday and was put on a medication for irregular heartbeat.  Stated it had gotten worse up until he called.  Stated he went to workout and got his HR up a little and feels better now.  Stated he had the same symptoms he had on Friday and stated he can't explain it.  Pt stated he's better now and wanted to inform Vernona Rieger.  Pt informed L. Annie Paras, NP will be notified and pt also given lab results and advice per result note.  Pt verbalized understanding and agreed w/ plan.

## 2012-10-19 NOTE — Telephone Encounter (Signed)
Was here Friday to see Vernona Rieger For irregular heart rate-she gave him medicine -it feel like its worse-please call!

## 2012-10-20 ENCOUNTER — Telehealth: Payer: Self-pay | Admitting: Cardiovascular Disease

## 2012-10-20 NOTE — Telephone Encounter (Signed)
Returned call.  Pt stated the Cardizem is not covered by his insurance and he wants to know if he will get prior approval or if the med will be changed.  Pt stated he has two pills left (one for tonight and one for tomorrow).  Pt informed L. Annie Paras, NP will be notified for further instructions and RN will call back.  Pt verbalized understanding and agreed w/ plan.  Please call cell [phone] and okay to leave a message if no answer after 5pm.  Sherian Rein, NP notified and stated pt will need prior authorization for Cardizem.  Call to pharmacy and pt will need prior authorization.  Called [phone] (given by pharm staff) and informed I needed to call [employer] at [phone].  Called and spoke w/ Lafonda Mosses.  Asked if pt was still on amlodipine and informed it was dc'd at last visit.  Lafonda Mosses stated diltiazem has been approved.  There was a conflict b/c of the amlodipine.  Call to pt and informed he should be able to pick up his refill.  Pt with concerns that the medicine has not had any effect on him and advised he try the medicine for at least 2 weeks for it to build-up in his system.  Pt informed it has not been a week yet since starting the med.  Pt verbalized understanding and agreed w/ plan.

## 2012-10-20 NOTE — Telephone Encounter (Signed)
Corey Fischer is calling to find out if the Cardizem will be approved thru his insurance company as well as if he should continue to take it because as of right now he does see where it is helping him .[name] Please call him at .[phone].[name] Home [phone] and right he is at the pharmacy.[name]

## 2012-11-03 ENCOUNTER — Telehealth: Payer: Self-pay | Admitting: Cardiovascular Disease

## 2012-11-03 NOTE — Telephone Encounter (Signed)
Returned call.  Pt stated he was seen 3 weeks ago and started on cardizem.  Stated his HR seems to be regulated.  Pt stated his BP was 158/65 today.  BP has been running 140s-150s/50s-60s w/ HR  40s-50s.  Pt c/o having a HA, which has gone away now.  Denied any symptoms currently.  Pt out of town now and informed B. Hager, PA-C will be notified to review chart and advise.  Pt verbalized understanding and agreed w/ plan.  Pt has appt w/ Dr. Allyson Sabal on Monday, June 23rd.  Message forwarded to B. Leron Croak, PA-C for further instructions.

## 2012-11-03 NOTE — Telephone Encounter (Signed)
Returned call and informed pt per instructions by MD/PA.  Pt verbalized understanding and agreed w/ plan.  Pt will keep appt on Monday.

## 2012-11-03 NOTE — Telephone Encounter (Signed)
Pt was calling stating that he was put on a different medication and his BP is escalating. He is going out of town this weekend is a little worried. Pt has an appointment with Dr. Allyson Sabal next week.

## 2012-11-03 NOTE — Telephone Encounter (Signed)
Continue current medical therapy.  Dr. Allyson Sabal will make any necessary changes.

## 2012-11-03 NOTE — Telephone Encounter (Signed)
Chart# [id number] on Extender cart.

## 2012-11-09 ENCOUNTER — Other Ambulatory Visit: Payer: Self-pay | Admitting: *Deleted

## 2012-11-09 ENCOUNTER — Encounter: Payer: Self-pay | Admitting: Cardiovascular Disease

## 2012-11-09 ENCOUNTER — Encounter: Payer: Self-pay | Admitting: *Deleted

## 2012-11-09 ENCOUNTER — Ambulatory Visit (INDEPENDENT_AMBULATORY_CARE_PROVIDER_SITE_OTHER): Payer: Medicare Other | Admitting: Cardiovascular Disease

## 2012-11-09 VITALS — BP 120/58 | HR 56 | Ht 68.0 in | Wt 151.1 lb

## 2012-11-09 DIAGNOSIS — E785 Hyperlipidemia, unspecified: Secondary | ICD-10-CM | POA: Diagnosis not present

## 2012-11-09 DIAGNOSIS — I491 Atrial premature depolarization: Secondary | ICD-10-CM | POA: Diagnosis not present

## 2012-11-09 DIAGNOSIS — R0989 Other specified symptoms and signs involving the circulatory and respiratory systems: Secondary | ICD-10-CM | POA: Diagnosis not present

## 2012-11-09 DIAGNOSIS — D519 Vitamin B12 deficiency anemia, unspecified: Secondary | ICD-10-CM

## 2012-11-09 DIAGNOSIS — I10 Essential (primary) hypertension: Secondary | ICD-10-CM | POA: Diagnosis not present

## 2012-11-09 DIAGNOSIS — Z79899 Other long term (current) drug therapy: Secondary | ICD-10-CM

## 2012-11-09 NOTE — Assessment & Plan Note (Signed)
Under good control and her medications 

## 2012-11-09 NOTE — Progress Notes (Signed)
11/09/2012 Corey Fischer   04/30/1932  [id number]  Primary Physician [name], MD Primary Cardiologist: [name] Corey Fischer   HPI:  The patient is a delightful 79 year old thin and fit appearing married Caucasian male father of 2, grandfather to 4 grandchildren who is self-referred for evaluation of symptomatic PVCs.  He is retired from working at [employer] 7 years ago. His risk factors are remarkable for treated hypertension and hyperlipidemia. He does not smoke and drinks socially. His family history is remarkable for a mother who had atrial fibrillation and a father who had angina, but no one has had documented ischemic heart disease. He has never had an MI or a stroke and denies chest pain or shortness of breath. His surgical history is remarkable for remote TURP, cholecystectomy, and hernia repair.  He has had PVCs since graduate school and was on propranolol remotely, prescribed by Dr. Glennon Hamilton. Over the last 2 months he has had progressive PVCs which he is more aware of and symptomatic from. He has cut out his caffeine. Lab work performed by Dr. Lovell Sheehan in December revealed a normal TSH.  He saw [name] registered nurse practitioner back at the end of May because of palpitations have gotten worse. He was started on Cardizem 30 mg by mouth twice a day and his simvastatin was decreased peripherally from 40-20 mg a day. The 2-D echo is essentially normal. He has been checking his blood pressures which are somewhat sporadic but averaging 1:30 to 140 range systolic.     Current Outpatient Prescriptions  Medication Sig Dispense Refill  . Ascorbic Acid (VITAMIN C) 1000 MG tablet Take 1,000 mg by mouth daily.       [name] aspirin 81 MG tablet Take 81 mg by mouth daily.      . Calcium 500 MG CHEW Chew 500 mg by mouth daily.      . cetirizine (ZYRTEC) 10 MG tablet Take 10 mg by mouth daily.       . Cholecalciferol (VITAMIN D3) 1000 UNITS CAPS Take 2,000  Units by mouth daily.       [name] co-enzyme Q-10 30 MG capsule Take 50 mg by mouth daily.      . Cyanocobalamin 500 MCG/0.1ML SOLN Place 2,500 mcg under the tongue 1 day or 1 dose. 1 spray in nostril every week      . diltiazem (CARDIZEM) 30 MG tablet Take 1 tablet (30 mg total) by mouth 2 (two) times daily.  60 tablet  6  . hydrocortisone-pramoxine (ANALPRAM-HC) 2.5-1 % rectal cream APPLY RECTALY AT BEDTIME AS NEEDED  28.35 g  11  . polyethylene glycol (MIRALAX / GLYCOLAX) packet Take 17 g by mouth daily.       . RABEprazole (ACIPHEX) 20 MG tablet Take 1 tablet (20 mg total) by mouth 2 (two) times daily. 2 daily  180 tablet  3  . senna (SENOKOT) 8.6 MG tablet Take 1 tablet by mouth as needed.      . simvastatin (ZOCOR) 20 MG tablet Take 20 mg by mouth at bedtime.       No current facility-administered medications for this visit.    Allergies  Allergen Reactions  . Ciprofloxacin     History   Social History  . Marital Status: Married    Spouse Name: N/A    Number of Children: 2  . Years of Education: N/A   Occupational History  . retired    Social History Main Topics  .  Smoking status: Never Smoker   . Smokeless tobacco: Never Used  . Alcohol Use: Yes     Comment: occasionally  . Drug Use: No  . Sexually Active: Yes   Other Topics Concern  . Not on file   Social History Narrative  . No narrative on file     Review of Systems: General: negative for chills, fever, night sweats or weight changes.  Cardiovascular: negative for chest pain, dyspnea on exertion, edema, orthopnea, palpitations, paroxysmal nocturnal dyspnea or shortness of breath Dermatological: negative for rash Respiratory: negative for cough or wheezing Urologic: negative for hematuria Abdominal: negative for nausea, vomiting, diarrhea, bright red blood per rectum, melena, or hematemesis Neurologic: negative for visual changes, syncope, or dizziness All other systems reviewed and are otherwise negative  except as noted above.    Blood pressure 120/58, pulse 56, height 5\' 8"  (1.727 m), weight 151 lb 1.6 oz (68.539 kg).  General appearance: alert and no distress Neck: no adenopathy, no JVD, supple, symmetrical, trachea midline, thyroid not enlarged, symmetric, no tenderness/mass/nodules and tthere is a soft right carotid bruit auscultated today Lungs: clear to auscultation bilaterally Heart: regular rate and rhythm, S1, S2 normal, no murmur, click, rub or gallop Abdomen: soft, non-tender; bowel sounds normal; no masses,  no organomegaly  EKG not performed today  ASSESSMENT AND PLAN:   PAC (premature atrial contraction), symptomatic with palpatataions Patient had an event monitor that showed occasional PVCs and PACs. He is placed on Cardizem 30 mg twice a day which resulted in improvement in his palpitations and symptoms. His amlodipine was discontinued. His simvastatin was decreased a properly from 40-20 mg a day. He did have a 2-D echo to essentially normal. He is also limited caffeine from his diet.  HYPERTENSION Under good control and her medications  HYPERLIPIDEMIA His simvastatin was decreased from 40-20 mg a day. We will we will recheck a lipid and liver profile.      [name] FACP,FACC,FAHA, [facility] 11/09/2012 11:14 AM

## 2012-11-09 NOTE — Assessment & Plan Note (Signed)
Patient had an event monitor that showed occasional PVCs and PACs. He is placed on Cardizem 30 mg twice a day which resulted in improvement in his palpitations and symptoms. His amlodipine was discontinued. His simvastatin was decreased a properly from 40-20 mg a day. He did have a 2-D echo to essentially normal. He is also limited caffeine from his diet.

## 2012-11-09 NOTE — Assessment & Plan Note (Signed)
His simvastatin was decreased from 40-20 mg a day. We will we will recheck a lipid and liver profile.

## 2012-11-09 NOTE — Patient Instructions (Addendum)
  We will see you back in follow up in 6 months with Vernona Rieger NP and 12 months with Dr Allyson Sabal  Dr Allyson Sabal has ordered carotid doppler and bloodwork to be done in 1 month fasting

## 2012-11-10 ENCOUNTER — Telehealth: Payer: Self-pay | Admitting: Internal Medicine

## 2012-11-10 MED ORDER — PROMETHAZINE HCL 25 MG RE SUPP: 25.0000 mg | Freq: Four times a day (QID) | RECTAL | Status: DC | PRN

## 2012-11-10 NOTE — Telephone Encounter (Signed)
Talked with pt and he said he felt like it was a stomach virus because he and his wife had same sxs. Ok for phenergan per dr Lovell Sheehan

## 2012-11-10 NOTE — Telephone Encounter (Signed)
Patient Information:  Caller Name: Djimon  Phone: [phone]  Patient: Corey Fischer, Corey Fischer  Gender: Male  DOB: February 27, 1932  Age: 79 Years  PCP: Darryll Capers (Adults only)  Office Follow Up:  Does the office need to follow up with this patient?: Yes  Instructions For The Office: Patient requesting script for Phenergan supp. vs ov/Standing orders only if no available appts.  Please advise patient if he may have script vs coming into office.??  RN Note:  Patient prefers script over ov if possible.  Symptoms  Reason For Call & Symptoms: Nausea since 11/09/12; No vomiting; No change in stools; Afebrile; Hydrated;  Requesting prescription for Phenergan Supp;  States wife also with nausea; No cp, sob, abd pain.  Not able to eat, drinking, hydrated and urinating.  Reviewed Health History In EMR: Yes  Reviewed Medications In EMR: Yes  Reviewed Allergies In EMR: Yes  Reviewed Surgeries / Procedures: N/A  Date of Onset of Symptoms: 11/09/2012  Guideline(s) Used:  No Protocol Available - Sick Adult  Disposition Per Guideline:   Home Care  Reason For Disposition Reached:   Patient's symptoms are safe to treat at home per nursing judgment  Advice Given:  N/A  RN Overrode Recommendation:  Patient Requests Prescription  Request script for Phenergan Suppository to [facility] on [address] [phone].  Please f/u with patient.

## 2012-11-12 ENCOUNTER — Ambulatory Visit (HOSPITAL_COMMUNITY)
Admission: RE | Admit: 2012-11-12 | Discharge: 2012-11-12 | Disposition: A | Discharge status: Home / Self Care | Payer: Medicare Other | Source: Ambulatory Visit | Attending: Internal Medicine | Admitting: Internal Medicine

## 2012-11-12 DIAGNOSIS — R0989 Other specified symptoms and signs involving the circulatory and respiratory systems: Secondary | ICD-10-CM | POA: Diagnosis not present

## 2012-11-12 NOTE — Progress Notes (Signed)
Carotid Duplex Completed. Lavonya Hoerner, RDMS, RVT  

## 2012-11-17 ENCOUNTER — Telehealth: Payer: Self-pay | Admitting: *Deleted

## 2012-11-17 ENCOUNTER — Encounter: Payer: Self-pay | Admitting: *Deleted

## 2012-11-17 DIAGNOSIS — I6523 Occlusion and stenosis of bilateral carotid arteries: Secondary | ICD-10-CM

## 2012-11-17 NOTE — Telephone Encounter (Signed)
Message copied by Marella Bile on Tue Nov 17, 2012  9:55 AM ------      Message from: Runell Gess      Created: Mon Nov 16, 2012  3:27 PM       Moderate bilateral ICA stenosis R>L. Repeat in 6 months ------

## 2012-12-10 ENCOUNTER — Ambulatory Visit (INDEPENDENT_AMBULATORY_CARE_PROVIDER_SITE_OTHER): Payer: Medicare Other | Admitting: Cardiology

## 2012-12-10 ENCOUNTER — Encounter: Payer: Self-pay | Admitting: Cardiology

## 2012-12-10 VITALS — BP 140/60 | HR 64 | Ht 67.0 in | Wt 150.0 lb

## 2012-12-10 DIAGNOSIS — I441 Atrioventricular block, second degree: Secondary | ICD-10-CM

## 2012-12-10 DIAGNOSIS — I4949 Other premature depolarization: Secondary | ICD-10-CM | POA: Diagnosis not present

## 2012-12-10 DIAGNOSIS — I491 Atrial premature depolarization: Secondary | ICD-10-CM | POA: Diagnosis not present

## 2012-12-10 DIAGNOSIS — R001 Bradycardia, unspecified: Secondary | ICD-10-CM | POA: Insufficient documentation

## 2012-12-10 DIAGNOSIS — I498 Other specified cardiac arrhythmias: Secondary | ICD-10-CM | POA: Diagnosis not present

## 2012-12-10 DIAGNOSIS — I493 Ventricular premature depolarization: Secondary | ICD-10-CM

## 2012-12-10 DIAGNOSIS — I10 Essential (primary) hypertension: Secondary | ICD-10-CM

## 2012-12-10 DIAGNOSIS — E785 Hyperlipidemia, unspecified: Secondary | ICD-10-CM

## 2012-12-10 NOTE — Assessment & Plan Note (Signed)
Baseline sinus bradycardia

## 2012-12-10 NOTE — Assessment & Plan Note (Signed)
Previously documented

## 2012-12-10 NOTE — Assessment & Plan Note (Signed)
Controlled, normal echo

## 2012-12-10 NOTE — Assessment & Plan Note (Signed)
Asymptomatic. 

## 2012-12-10 NOTE — Assessment & Plan Note (Signed)
New since low dose Diltiazem added. Symptomatic palpitations.

## 2012-12-10 NOTE — Progress Notes (Signed)
12/10/2012 Corey Fischer   1931/12/02  [id number]  Primary Physicia Corey Mew, MD Primary Cardiologist: Dr Corey Fischer  HPI:  Pleasant 79 y/o male seen remotely for palpitations and PVCs and placed on Inderal which he only used for a few months. He has a history of moderate asymptomatic carotid artery disease which we follow by dopplers. He has HTN and treated dyslipidemia. Recently he has noted increasing palpitations. He had a Holter in February that showed PVCs and PACs, Echo was normal.He was placed on low dose Diltiazem as he has baseline bradycardia. He is active and exercises regularly. He has noticed a decrease in his exercise capacity over the past few months. He does not have exercise palpitations. He has no history of syncope. He is seen today for follow up after Diltiazem added. He actually says he feels worse with increasing palpitations. EKG today show 2nd degree Mobitz 1 AVB.    Current Outpatient Prescriptions  Medication Sig Dispense Refill  . Ascorbic Acid (VITAMIN C) 1000 MG tablet Take 1,000 mg by mouth daily.       [name] aspirin 81 MG tablet Take 81 mg by mouth daily.      . Calcium 500 MG CHEW Chew 500 mg by mouth daily.      . cetirizine (ZYRTEC) 10 MG tablet Take 10 mg by mouth daily.       . Cholecalciferol (VITAMIN D3) 1000 UNITS CAPS Take 2,000 Units by mouth daily.       [name] co-enzyme Q-10 30 MG capsule Take 50 mg by mouth daily.      . Cyanocobalamin 500 MCG/0.1ML SOLN Place 2,500 mcg under the tongue 1 day or 1 dose. 1 spray in nostril every week      . hydrocortisone-pramoxine (ANALPRAM-HC) 2.5-1 % rectal cream APPLY RECTALY AT BEDTIME AS NEEDED  28.35 g  11  . polyethylene glycol (MIRALAX / GLYCOLAX) packet Take 17 g by mouth daily.       . promethazine (PHENERGAN) 25 MG suppository Place 1 suppository (25 mg total) rectally every 6 (six) hours as needed for nausea.  12 each  0  . RABEprazole (ACIPHEX) 20 MG tablet Take 20 mg by mouth daily. 2 daily      . senna  (SENOKOT) 8.6 MG tablet Take 1 tablet by mouth as needed.      . simvastatin (ZOCOR) 20 MG tablet Take 20 mg by mouth at bedtime.       No current facility-administered medications for this visit.    Allergies  Allergen Reactions  . Ciprofloxacin     History   Social History  . Marital Status: Married    Spouse Name: N/A    Number of Children: 2  . Years of Education: N/A   Occupational History  . retired    Social History Main Topics  . Smoking status: Never Smoker   . Smokeless tobacco: Never Used  . Alcohol Use: Yes     Comment: occasionally  . Drug Use: No  . Sexually Active: Yes   Other Topics Concern  . Not on file   Social History Narrative  . No narrative on file     Review of Systems: General: negative for chills, fever, night sweats or weight changes.  Cardiovascular: negative for chest pain, dyspnea on exertion, edema, orthopnea, palpitations, paroxysmal nocturnal dyspnea or shortness of breath Dermatological: negative for rash Respiratory: negative for cough or wheezing Urologic: negative for hematuria Abdominal: negative for nausea, vomiting, diarrhea, bright red blood  per rectum, melena, or hematemesis Neurologic: negative for visual changes, syncope, or dizziness All other systems reviewed and are otherwise negative except as noted above.    Blood pressure 140/60, pulse 64, height 5\' 7"  (1.702 m), weight 150 lb (68.04 kg).  General appearance: alert, cooperative and no distress Lungs: clear to auscultation bilaterally Heart: irregularly irregular rhythm Extremities: extremities normal, atraumatic, no cyanosis or edema  EKG  EKG: 2nd degree Mobitz 1 AVB.  ASSESSMENT AND PLAN:   2Nd degree AV block- Mobitz1 New since low dose Diltiazem added. Symptomatic palpitations.  PAC (premature atrial contraction), symptomatic with palpatataions Previously documented  PVC's (premature ventricular contractions) Symptomatic  Bradycardia Baseline  sinus bradycardia  HYPERTENSION Controlled, normal echo  CAROTID ARTERY STENOSIS, BILATERAL Asymptomatic  HYPERLIPIDEMIA Treated   PLAN  : I suggested Corey Fischer stop his Diltiazem. I suggested he take some OTC Mg++-400mg  daily. I have set him up for an exercise Myoview. He has multiple cardiac risk factors including known PVD and he has had a drop in his exercise tolerance as well as a new arrythmia. I ordered anoth 2 week event monitor to look for deteriorating arrythmia and worsening bradycardia.   Corey Fischer KPA-C 12/10/2012 10:33 AM

## 2012-12-10 NOTE — Patient Instructions (Addendum)
OTC Magnesium dietary supplement, not more than 400mg  daily. Stop Diltiazem. Your physician has requested that you have en exercise stress myoview. For further information please visit [email or web address] Please follow instruction sheet, as given. Your physician has recommended that you wear event monitor. Event monitors are medical devices that record the heart's electrical activity. Doctors most often use these monitors to diagnose arrhythmias. Arrhythmias are problems with the speed or rhythm of the heartbeat. The monitor is a small, portable device. You can wear one while you do your normal daily activities. This is usually used to diagnose what is causing palpitations/syncope (passing out). Your physician recommends that you schedule a follow-up appointment in: 2 weeks after tests

## 2012-12-10 NOTE — Assessment & Plan Note (Signed)
Symptomatic

## 2012-12-10 NOTE — Assessment & Plan Note (Signed)
Treated

## 2012-12-15 ENCOUNTER — Telehealth: Payer: Self-pay | Admitting: Cardiovascular Disease

## 2012-12-15 NOTE — Telephone Encounter (Signed)
Returned call.  Pt stated he was on another call and he has to take it.  Asked pt if he'd like to call back tomorrow.  Pt stated he will.  Call ended.

## 2012-12-15 NOTE — Telephone Encounter (Signed)
Irregular heart beats are getting worse-used to get a break from them-it just about all the time.Wonder if he might need to be seen?

## 2012-12-16 ENCOUNTER — Telehealth: Payer: Self-pay | Admitting: Cardiovascular Disease

## 2012-12-16 DIAGNOSIS — Z79899 Other long term (current) drug therapy: Secondary | ICD-10-CM

## 2012-12-16 LAB — MAGNESIUM: Magnesium: 2.1 mg/dL (ref 1.5–2.5)

## 2012-12-16 LAB — BASIC METABOLIC PANEL
BUN: 17 mg/dL (ref 6–23)
Creat: 1.23 mg/dL (ref 0.50–1.35)
Glucose, Bld: 71 mg/dL (ref 70–99)

## 2012-12-16 NOTE — Telephone Encounter (Signed)
Wants to if he should in before Friday  Because the irregularities are more constant now . Did try the magnesium citrate over the counter and nothing has changed , should he continue to keep taking this.. Please call   Thanks

## 2012-12-16 NOTE — Telephone Encounter (Signed)
Returned call.  Pt stated the heart irregularity seems to be constant.  Stated the magnesium is not working and the irregularities have gotten worse.  Asked pt if he is taking magnesium "citrate" or "oxide."  Pt stated he is taking magnesium citrate b/c it gets in the blood stream faster and you get more magnesium.  Pt informed usually Mg Oxide is what is taken and RN will need to clarify if he should change this or come in for a sooner appt.  Pt stated he is supposed to have a stress test on Friday.  Pt informed RN will discuss w/ a provider and call him back.  Pt verbalized understanding and agreed w/ plan.  Vernona Rieger, NP notified and advised BMP and Mg level to see where levels are and then advise from there.    Call to pt and informed.  Verbalized understanding and agreed w/ plan.  Pt will to to lab this afternoon to have labs drawn as he is eating lunch now.

## 2012-12-17 NOTE — Telephone Encounter (Signed)
Returned call.  Pt stated he did receive message.  Asked if he needed to continue taking Mg++ and informed per Result Note that he does not if he doesn't think it's helping.  Pt asked if results could be released in MyChart and they were.  Pt will keep appt tomorrow.

## 2012-12-17 NOTE — Telephone Encounter (Signed)
Returned call.  Left message to call back and results normal.  Keep appt tomorrow.

## 2012-12-17 NOTE — Telephone Encounter (Signed)
Mg++ 2.1. Keep appointment for Myoview in am.  Corine Shelter PA-C 12/17/2012 2:09 PM

## 2012-12-17 NOTE — Telephone Encounter (Signed)
Returned your call . Please call .. Thanks  °

## 2012-12-18 ENCOUNTER — Ambulatory Visit (HOSPITAL_COMMUNITY)
Admission: RE | Admit: 2012-12-18 | Discharge: 2012-12-18 | Disposition: A | Discharge status: Home / Self Care | Payer: Medicare Other | Source: Ambulatory Visit | Attending: Cardiovascular Disease | Admitting: Cardiovascular Disease

## 2012-12-18 DIAGNOSIS — I10 Essential (primary) hypertension: Secondary | ICD-10-CM | POA: Insufficient documentation

## 2012-12-18 DIAGNOSIS — R002 Palpitations: Secondary | ICD-10-CM | POA: Insufficient documentation

## 2012-12-18 DIAGNOSIS — Z8249 Family history of ischemic heart disease and other diseases of the circulatory system: Secondary | ICD-10-CM | POA: Insufficient documentation

## 2012-12-18 DIAGNOSIS — I6529 Occlusion and stenosis of unspecified carotid artery: Secondary | ICD-10-CM | POA: Insufficient documentation

## 2012-12-18 DIAGNOSIS — I498 Other specified cardiac arrhythmias: Secondary | ICD-10-CM | POA: Diagnosis not present

## 2012-12-18 DIAGNOSIS — I739 Peripheral vascular disease, unspecified: Secondary | ICD-10-CM | POA: Diagnosis not present

## 2012-12-18 DIAGNOSIS — Z79899 Other long term (current) drug therapy: Secondary | ICD-10-CM | POA: Diagnosis not present

## 2012-12-18 DIAGNOSIS — I491 Atrial premature depolarization: Secondary | ICD-10-CM | POA: Insufficient documentation

## 2012-12-18 DIAGNOSIS — I441 Atrioventricular block, second degree: Secondary | ICD-10-CM | POA: Diagnosis not present

## 2012-12-18 DIAGNOSIS — R9431 Abnormal electrocardiogram [ECG] [EKG]: Secondary | ICD-10-CM

## 2012-12-18 DIAGNOSIS — I4949 Other premature depolarization: Secondary | ICD-10-CM | POA: Insufficient documentation

## 2012-12-18 DIAGNOSIS — E785 Hyperlipidemia, unspecified: Secondary | ICD-10-CM | POA: Diagnosis not present

## 2012-12-18 LAB — LIPID PANEL
Cholesterol: 140 mg/dL (ref 0–200)
Triglycerides: 135 mg/dL (ref <150)

## 2012-12-18 LAB — HEPATIC FUNCTION PANEL
ALT: 13 U/L (ref 0–53)
AST: 16 U/L (ref 0–37)
Alkaline Phosphatase: 58 U/L (ref 39–117)
Indirect Bilirubin: 0.6 mg/dL (ref 0.0–0.9)
Total Protein: 6.8 g/dL (ref 6.0–8.3)

## 2012-12-18 MED ORDER — TECHNETIUM TC 99M SESTAMIBI GENERIC - CARDIOLITE
30.0000 | Freq: Once | INTRAVENOUS | Status: AC | PRN
  Administered 2012-12-18: 30 via INTRAVENOUS

## 2012-12-18 MED ORDER — TECHNETIUM TC 99M SESTAMIBI GENERIC - CARDIOLITE
10.0000 | Freq: Once | INTRAVENOUS | Status: AC | PRN
  Administered 2012-12-18: 10 via INTRAVENOUS

## 2012-12-18 NOTE — Procedures (Addendum)
[facility] [address] [place] [address] [place] [address] [phone]  Cardiology Nuclear Med Study  Corey Fischer is a 79 y.o. male     MRN : [id number]     DOB: 1931-05-28  Procedure Date: 12/18/2012  Nuclear Med Background Indication for Stress Test:  Abnormal EKG History:  IRREGULAR HEART BEAT Cardiac Risk Factors: Carotid Disease, Family History - CAD, Hypertension, Lipids, PVD and PAC'S;PVC'S;BRADYCARDIA  Symptoms:  Palpitations   Nuclear Pre-Procedure Caffeine/Decaff Intake:  7:00pm NPO After: 5:00am   IV Site: R Antecubital  IV 0.9% NS with Angio Cath:  22g  Chest Size (in):  38"  IV Started by: Emmit Pomfret, RN  Height: 5\' 7"  (1.702 m)  Cup Size: n/a  BMI:  Body mass index is 23.49 kg/(m^2). Weight:  150 lb (68.04 kg)   Tech Comments:  N/A    Nuclear Med Study 1 or 2 day study: 1 day  Stress Test Type:  Stress  Order Authorizing Provider:  Nanetta Batty, MD   Resting Radionuclide: Technetium 58m Sestamibi  Resting Radionuclide Dose: 10.2 mCi   Stress Radionuclide:  Technetium 38m Sestamibi  Stress Radionuclide Dose: 29.9 mCi           Stress Protocol Rest HR: 59 Stress HR: 131  Rest BP: 146/62 Stress BP: 176/59  Exercise Time (min): 11:30 METS: 13.4   Predicted Max HR: 142 bpm % Max HR: 92.25 bpm Rate Pressure Product: 21308  Dose of Adenosine (mg):  n/a Dose of Lexiscan: n/a mg  Dose of Atropine (mg): n/a Dose of Dobutamine: n/a mcg/kg/min (at max HR)  Stress Test Technologist: Esperanza Sheets, CCT Nuclear Technologist: Koren Shiver, CNMT   Rest Procedure:  Myocardial perfusion imaging was performed at rest 45 minutes following the intravenous administration of Technetium 82m Sestamibi. Stress Procedure:  The patient performed treadmill exercise using a Bruce  Protocol for 11:30 minutes. The patient stopped due to Target heart rate achieved and mild fatigue and denied any chest pain.  There were no  significant ST-T wave changes.  Technetium 34m Sestamibi was injected at peak exercise and myocardial perfusion imaging was performed after a brief delay.  Transient Ischemic Dilatation (Normal <1.22):  0.86 Lung/Heart Ratio (Normal <0.45):  0.24 QGS EDV:  n/a ml QGS ESV:  n/a ml LV Ejection Fraction: Study not gated  Signed by      Rest ECG: NSR - Normal EKG  Stress ECG: No significant change from baseline ECG  QPS Raw Data Images:  Normal; no motion artifact; normal heart/lung ratio. Stress Images:  Normal homogeneous uptake in all areas of the myocardium. Rest Images:  Normal homogeneous uptake in all areas of the myocardium. Subtraction (SDS):  No evidence of ischemia.  Impression Exercise Capacity:  Excellent exercise capacity. BP Response:  Normal blood pressure response. Clinical Symptoms:  No significant symptoms noted. ECG Impression:  No significant ST segment change suggestive of ischemia. Comparison with Prior Nuclear Study: No previous nuclear study performed  Overall Impression:  Normal stress nuclear study.  LV Wall Motion:  Not gated because of ectopy   Runell Gess, MD  12/18/2012 5:59 PM

## 2012-12-24 ENCOUNTER — Encounter: Payer: Self-pay | Admitting: Cardiology

## 2012-12-24 ENCOUNTER — Encounter: Payer: Self-pay | Admitting: Internal Medicine

## 2012-12-24 ENCOUNTER — Ambulatory Visit (INDEPENDENT_AMBULATORY_CARE_PROVIDER_SITE_OTHER): Payer: Medicare Other | Admitting: Cardiology

## 2012-12-24 ENCOUNTER — Ambulatory Visit: Payer: Medicare Other | Admitting: Cardiology

## 2012-12-24 ENCOUNTER — Encounter: Payer: Self-pay | Admitting: *Deleted

## 2012-12-24 VITALS — BP 130/40 | HR 76 | Ht 67.0 in | Wt 152.0 lb

## 2012-12-24 DIAGNOSIS — I4949 Other premature depolarization: Secondary | ICD-10-CM

## 2012-12-24 DIAGNOSIS — I441 Atrioventricular block, second degree: Secondary | ICD-10-CM

## 2012-12-24 DIAGNOSIS — R001 Bradycardia, unspecified: Secondary | ICD-10-CM

## 2012-12-24 DIAGNOSIS — I491 Atrial premature depolarization: Secondary | ICD-10-CM | POA: Diagnosis not present

## 2012-12-24 DIAGNOSIS — I493 Ventricular premature depolarization: Secondary | ICD-10-CM

## 2012-12-24 DIAGNOSIS — E785 Hyperlipidemia, unspecified: Secondary | ICD-10-CM | POA: Diagnosis not present

## 2012-12-24 DIAGNOSIS — I498 Other specified cardiac arrhythmias: Secondary | ICD-10-CM

## 2012-12-24 NOTE — Assessment & Plan Note (Addendum)
Myoview negative, LVF normal.

## 2012-12-24 NOTE — Assessment & Plan Note (Signed)
Treated

## 2012-12-24 NOTE — Assessment & Plan Note (Signed)
Asymptomatic. 

## 2012-12-24 NOTE — Progress Notes (Signed)
12/24/2012 Corey Fischer   13-Oct-1931  [id number]  Primary Physicia [name], MD Primary Cardiologist: Dr Allyson Sabal  HPI:  79 y/o who saw for PVCs and PACs. He was pput on low dose Diltiazem because of baseline bradycardia. When I saw him last office visit he was in [place]. I stopped the Diltiazem and got a Myoview (negative with NL LVF). He also wore a monitor. He is here with his wife today for follow up. He denies any fatigue, near syncope, or syncope. He has noticed "hard beats" but not as frequent. Yesterday he played racquetball without problems.   Current Outpatient Prescriptions  Medication Sig Dispense Refill  . Ascorbic Acid (VITAMIN C) 1000 MG tablet Take 1,000 mg by mouth daily.       [name] aspirin 81 MG tablet Take 81 mg by mouth daily.      . Calcium 500 MG CHEW Chew 500 mg by mouth daily.      . cetirizine (ZYRTEC) 10 MG tablet Take 10 mg by mouth daily.       . Cholecalciferol (VITAMIN D3) 1000 UNITS CAPS Take 2,000 Units by mouth daily.       [name] co-enzyme Q-10 30 MG capsule Take 50 mg by mouth daily.      . Cyanocobalamin 500 MCG/0.1ML SOLN Place 2,500 mcg under the tongue 1 day or 1 dose. 1 spray in nostril every week      . hydrocortisone-pramoxine (ANALPRAM-HC) 2.5-1 % rectal cream APPLY RECTALY AT BEDTIME AS NEEDED  28.35 g  11  . magnesium oxide (MAG-OX) 400 MG tablet Take 400 mg by mouth daily.      . polyethylene glycol (MIRALAX / GLYCOLAX) packet Take 17 g by mouth daily.       . promethazine (PHENERGAN) 25 MG suppository Place 1 suppository (25 mg total) rectally every 6 (six) hours as needed for nausea.  12 each  0  . RABEprazole (ACIPHEX) 20 MG tablet Take 20 mg by mouth daily. 2 daily      . senna (SENOKOT) 8.6 MG tablet Take 1 tablet by mouth as needed.      . simvastatin (ZOCOR) 20 MG tablet Take 20 mg by mouth at bedtime.       No current facility-administered medications for this visit.    Allergies  Allergen Reactions  . Ciprofloxacin     History    Social History  . Marital Status: Married    Spouse Name: N/A    Number of Children: 2  . Years of Education: N/A   Occupational History  . retired    Social History Main Topics  . Smoking status: Never Smoker   . Smokeless tobacco: Never Used  . Alcohol Use: Yes     Comment: occasionally  . Drug Use: No  . Sexually Active: Yes   Other Topics Concern  . Not on file   Social History Narrative  . No narrative on file     Review of Systems: General: negative for chills, fever, night sweats or weight changes.  Cardiovascular: negative for chest pain, dyspnea on exertion, edema, orthopnea, palpitations, paroxysmal nocturnal dyspnea or shortness of breath Dermatological: negative for rash Respiratory: negative for cough or wheezing Urologic: negative for hematuria Abdominal: negative for nausea, vomiting, diarrhea, bright red blood per rectum, melena, or hematemesis Neurologic: negative for visual changes, syncope, or dizziness All other systems reviewed and are otherwise negative except as noted above.    Blood pressure 130/40, pulse 76, height 5\' 7"  (  1.702 m), weight 152 lb (68.947 kg).  General appearance: alert, cooperative, no distress and thin Lungs: clear to auscultation bilaterally Heart: regular rate and rhythm, S1, S2 normal, no murmur, click, rub or gallop    ASSESSMENT AND PLAN:   2Nd degree AV block- Mobitz1 He seems to be tolerating this well. Diltiazem has been stopped (was on it for PVCs). Discussed with Dr Allyson Sabal, no further Rx unless he becomes symptomatic.  PVC's (premature ventricular contractions) Myoview negative, LVF normal.  PAC (premature atrial contraction), symptomatic with palpatataions .  HYPERLIPIDEMIA Treated  Bradycardia Asymptomatic   PLAN  Discussed with Dr Allyson Sabal, no further treatment (pacemaker) unless he becomes symptomatic. I discussed this the pt and his wife in detail.   [facility] KPA-C 12/24/2012 3:53 PM

## 2012-12-24 NOTE — Patient Instructions (Signed)
Activity as tolerated. See Dr Allyson Sabal in 3 months

## 2012-12-24 NOTE — Assessment & Plan Note (Signed)
He seems to be tolerating this well. Diltiazem has been stopped (was on it for PVCs). Discussed with Dr Allyson Sabal, no further Rx unless he becomes symptomatic.

## 2012-12-27 ENCOUNTER — Encounter: Payer: Self-pay | Admitting: Internal Medicine

## 2012-12-27 ENCOUNTER — Encounter: Payer: Self-pay | Admitting: *Deleted

## 2012-12-28 ENCOUNTER — Other Ambulatory Visit: Payer: Self-pay | Admitting: *Deleted

## 2012-12-28 NOTE — Telephone Encounter (Signed)
Mack Guise, I have sent a message to out billing and coding department and asked them how this works.  I dont know when I will hear from them,but I will let you know. I hope to hear from them today.

## 2012-12-31 DIAGNOSIS — M169 Osteoarthritis of hip, unspecified: Secondary | ICD-10-CM | POA: Diagnosis not present

## 2013-01-05 ENCOUNTER — Other Ambulatory Visit (INDEPENDENT_AMBULATORY_CARE_PROVIDER_SITE_OTHER): Payer: Medicare Other

## 2013-01-05 DIAGNOSIS — D518 Other vitamin B12 deficiency anemias: Secondary | ICD-10-CM | POA: Diagnosis not present

## 2013-01-05 DIAGNOSIS — D519 Vitamin B12 deficiency anemia, unspecified: Secondary | ICD-10-CM

## 2013-01-05 LAB — VITAMIN B12: Vitamin B-12: 1225 pg/mL — ABNORMAL HIGH (ref 211–911)

## 2013-01-11 ENCOUNTER — Encounter: Payer: Self-pay | Admitting: Internal Medicine

## 2013-01-14 DIAGNOSIS — M25559 Pain in unspecified hip: Secondary | ICD-10-CM | POA: Diagnosis not present

## 2013-01-15 ENCOUNTER — Telehealth: Payer: Self-pay | Admitting: Cardiovascular Disease

## 2013-01-15 NOTE — Telephone Encounter (Signed)
Yesterday his heart rate had increased by 3o beats-he had steroid shot yesterday-wants to know if that could cause the increase in his heart rat?[name]

## 2013-01-15 NOTE — Telephone Encounter (Signed)
Spoke to patient . He states his rate was in the 80's  Since injection,usually 40's to 50's at rest Continue monitor.  Call if any changes.

## 2013-01-20 ENCOUNTER — Encounter: Payer: Self-pay | Admitting: Cardiovascular Disease

## 2013-02-11 ENCOUNTER — Other Ambulatory Visit: Payer: Self-pay | Admitting: Internal Medicine

## 2013-02-17 ENCOUNTER — Encounter: Payer: Self-pay | Admitting: Cardiovascular Disease

## 2013-02-17 ENCOUNTER — Ambulatory Visit (INDEPENDENT_AMBULATORY_CARE_PROVIDER_SITE_OTHER): Payer: Medicare Other | Admitting: Cardiovascular Disease

## 2013-02-17 VITALS — BP 138/68 | HR 64 | Ht 67.0 in | Wt 150.0 lb

## 2013-02-17 DIAGNOSIS — E785 Hyperlipidemia, unspecified: Secondary | ICD-10-CM | POA: Diagnosis not present

## 2013-02-17 DIAGNOSIS — I6529 Occlusion and stenosis of unspecified carotid artery: Secondary | ICD-10-CM | POA: Diagnosis not present

## 2013-02-17 DIAGNOSIS — I4949 Other premature depolarization: Secondary | ICD-10-CM

## 2013-02-17 DIAGNOSIS — I493 Ventricular premature depolarization: Secondary | ICD-10-CM

## 2013-02-17 NOTE — Patient Instructions (Addendum)
Your physician wants you to follow-up in: 1 year with Dr Berry. You will receive a reminder letter in the mail two months in advance. If you don't receive a letter, please call our office to schedule the follow-up appointment.  

## 2013-02-17 NOTE — Assessment & Plan Note (Signed)
Adjuvant therapy with recent cholesterol level drawn 12/18/12 revealing a toe pressure 140, LDL of 67 and HDL of 46

## 2013-02-17 NOTE — Progress Notes (Signed)
02/17/2013 Corey Fischer   21-Nov-1931  [id number]  Primary Physician [name], MD Primary Cardiologist: [name] Corey Fischer   HPI:  The patient is a delightful 79 year old thin and fit appearing married Caucasian male father of 2, grandfather to 4 grandchildren who is self-referred for evaluation of symptomatic PVCs.  He is retired from working at [employer] 7 years ago. His risk factors are remarkable for treated hypertension and hyperlipidemia. He does not smoke and drinks socially. His family history is remarkable for a mother who had atrial fibrillation and a father who had angina, but no one has had documented ischemic heart disease. He has never had an MI or a stroke and denies chest pain or shortness of breath. His surgical history is remarkable for remote TURP, cholecystectomy, and hernia repair.  He has had PVCs since graduate school and was on propranolol remotely, prescribed by Dr. Glennon Hamilton. Over the last 2 months he has had progressive PVCs which he is more aware of and symptomatic from. He has cut out his caffeine. Lab work performed by Dr. Lovell Sheehan in December revealed a normal TSH.  He saw [name] registered nurse practitioner back at the end of May because of palpitations have gotten worse. He was started on Cardizem 30 mg by mouth twice a day and his simvastatin was decreased peripherally from 40-20 mg a day. The 2-D echo is essentially normal. He has been checking his blood pressures which are somewhat sporadic but averaging 1:30 to 140 range systolic.  We have stopped his diltiazem which would cause contributing to his [place] block and symptoms and changed him to amlodipine. He has no further symptoms. He denies chest pain or shortness of breath. He does have mild to moderate carotid disease or vomiting post ultrasound. His lipid profile is excellent on statin therapy.     Current Outpatient Prescriptions  Medication Sig Dispense Refill    . amLODipine (NORVASC) 2.5 MG tablet TAKE 1 TABLET BY MOUTH EVERY DAY  90 tablet  3  . Ascorbic Acid (VITAMIN C) 1000 MG tablet Take 1,000 mg by mouth daily.       [name] aspirin 81 MG tablet Take 81 mg by mouth daily.      . Calcium 500 MG CHEW Chew 500 mg by mouth daily.      . cetirizine (ZYRTEC) 10 MG tablet Take 10 mg by mouth daily.       . Cholecalciferol (VITAMIN D3) 1000 UNITS CAPS Take 2,000 Units by mouth daily.       [name] co-enzyme Q-10 30 MG capsule Take 50 mg by mouth daily.      . Cyanocobalamin 500 MCG/0.1ML SOLN Place 2,500 mcg under the tongue 1 day or 1 dose. 1 spray in nostril every week      . hydrocortisone-pramoxine (ANALPRAM-HC) 2.5-1 % rectal cream APPLY RECTALY AT BEDTIME AS NEEDED  28.35 g  11  . magnesium oxide (MAG-OX) 400 MG tablet Take 400 mg by mouth daily.      . polyethylene glycol (MIRALAX / GLYCOLAX) packet Take 17 g by mouth daily.       . promethazine (PHENERGAN) 25 MG suppository Place 1 suppository (25 mg total) rectally every 6 (six) hours as needed for nausea.  12 each  0  . RABEprazole (ACIPHEX) 20 MG tablet TAKE 1 TABLET BY MOUTH TWICE DAILY  180 tablet  3  . senna (SENOKOT) 8.6 MG tablet Take 1 tablet by mouth as needed.      [name]  simvastatin (ZOCOR) 20 MG tablet        No current facility-administered medications for this visit.    Allergies  Allergen Reactions  . Ciprofloxacin     History   Social History  . Marital Status: Married    Spouse Name: N/A    Number of Children: 2  . Years of Education: N/A   Occupational History  . retired    Social History Main Topics  . Smoking status: Never Smoker   . Smokeless tobacco: Never Used  . Alcohol Use: Yes     Comment: occasionally  . Drug Use: No  . Sexual Activity: Yes   Other Topics Concern  . Not on file   Social History Narrative  . No narrative on file     Review of Systems: General: negative for chills, fever, night sweats or weight changes.  Cardiovascular: negative for chest  pain, dyspnea on exertion, edema, orthopnea, palpitations, paroxysmal nocturnal dyspnea or shortness of breath Dermatological: negative for rash Respiratory: negative for cough or wheezing Urologic: negative for hematuria Abdominal: negative for nausea, vomiting, diarrhea, bright red blood per rectum, melena, or hematemesis Neurologic: negative for visual changes, syncope, or dizziness All other systems reviewed and are otherwise negative except as noted above.    Blood pressure 138/68, pulse 64, height 5\' 7"  (1.702 m), weight 150 lb (68.04 kg).  General appearance: alert and no distress Neck: no adenopathy, no JVD, supple, symmetrical, trachea midline, thyroid not enlarged, symmetric, no tenderness/mass/nodules and soft carotid bruits bilaterally Lungs: clear to auscultation bilaterally Heart: regular rate and rhythm, S1, S2 normal, no murmur, click, rub or gallop Extremities: extremities normal, atraumatic, no cyanosis or edema  EKG not performed today  ASSESSMENT AND PLAN:   HYPERLIPIDEMIA Adjuvant therapy with recent cholesterol level drawn 12/18/12 revealing a toe pressure 140, LDL of 67 and HDL of 46  PVC's (premature ventricular contractions) Event monitor showed [place]enke[place]  After changing from diltiazem to amlodipine his symptoms have resolved  CAROTID ARTERY STENOSIS, BILATERAL Father duplex ultrasound of the last done in June. He had moderate right and mild left internal carotid artery stenosis. He is neurologically asymptomatic on aspirin.      [name] FACP,FACC,FAHA, [facility] 02/17/2013 2:04 PM

## 2013-02-17 NOTE — Assessment & Plan Note (Signed)
Event monitor showed wenkebach  After changing from diltiazem to amlodipine his symptoms have resolved

## 2013-02-17 NOTE — Assessment & Plan Note (Signed)
Father duplex ultrasound of the last done in June. He had moderate right and mild left internal carotid artery stenosis. He is neurologically asymptomatic on aspirin.

## 2013-02-22 ENCOUNTER — Encounter: Payer: Self-pay | Admitting: Internal Medicine

## 2013-02-22 ENCOUNTER — Telehealth: Payer: Self-pay | Admitting: *Deleted

## 2013-02-22 DIAGNOSIS — H9193 Unspecified hearing loss, bilateral: Secondary | ICD-10-CM

## 2013-02-22 NOTE — Telephone Encounter (Signed)
done

## 2013-02-23 ENCOUNTER — Encounter: Payer: Self-pay | Admitting: Cardiovascular Disease

## 2013-02-23 ENCOUNTER — Encounter: Payer: Self-pay | Admitting: Internal Medicine

## 2013-02-23 DIAGNOSIS — Z23 Encounter for immunization: Secondary | ICD-10-CM | POA: Diagnosis not present

## 2013-03-16 DIAGNOSIS — H43819 Vitreous degeneration, unspecified eye: Secondary | ICD-10-CM | POA: Diagnosis not present

## 2013-03-16 DIAGNOSIS — H3560 Retinal hemorrhage, unspecified eye: Secondary | ICD-10-CM | POA: Diagnosis not present

## 2013-03-18 DIAGNOSIS — M25559 Pain in unspecified hip: Secondary | ICD-10-CM | POA: Diagnosis not present

## 2013-03-22 DIAGNOSIS — H905 Unspecified sensorineural hearing loss: Secondary | ICD-10-CM | POA: Diagnosis not present

## 2013-03-23 ENCOUNTER — Encounter: Payer: Self-pay | Admitting: Internal Medicine

## 2013-03-26 ENCOUNTER — Other Ambulatory Visit: Payer: Self-pay | Admitting: Orthopedic Surgery

## 2013-03-26 DIAGNOSIS — M25552 Pain in left hip: Secondary | ICD-10-CM

## 2013-03-27 ENCOUNTER — Ambulatory Visit
Admission: RE | Admit: 2013-03-27 | Discharge: 2013-03-27 | Disposition: A | Discharge status: Home / Self Care | Payer: Medicare Other | Source: Ambulatory Visit | Attending: Orthopedic Surgery | Admitting: Orthopedic Surgery

## 2013-03-27 DIAGNOSIS — M25459 Effusion, unspecified hip: Secondary | ICD-10-CM | POA: Diagnosis not present

## 2013-03-27 DIAGNOSIS — M169 Osteoarthritis of hip, unspecified: Secondary | ICD-10-CM | POA: Diagnosis not present

## 2013-03-27 DIAGNOSIS — M25552 Pain in left hip: Secondary | ICD-10-CM

## 2013-03-28 ENCOUNTER — Encounter: Payer: Self-pay | Admitting: Internal Medicine

## 2013-03-30 DIAGNOSIS — M169 Osteoarthritis of hip, unspecified: Secondary | ICD-10-CM | POA: Diagnosis not present

## 2013-04-05 ENCOUNTER — Encounter: Payer: Self-pay | Admitting: Internal Medicine

## 2013-04-06 DIAGNOSIS — M25559 Pain in unspecified hip: Secondary | ICD-10-CM | POA: Diagnosis not present

## 2013-04-12 ENCOUNTER — Encounter: Payer: Self-pay | Admitting: *Deleted

## 2013-04-20 ENCOUNTER — Encounter: Payer: Self-pay | Admitting: Internal Medicine

## 2013-04-20 DIAGNOSIS — M169 Osteoarthritis of hip, unspecified: Secondary | ICD-10-CM | POA: Diagnosis not present

## 2013-04-22 ENCOUNTER — Telehealth: Payer: Self-pay | Admitting: *Deleted

## 2013-04-22 DIAGNOSIS — Z01818 Encounter for other preprocedural examination: Secondary | ICD-10-CM

## 2013-04-22 NOTE — Telephone Encounter (Signed)
Still waiting to see if he need a stress test.

## 2013-04-22 NOTE — Telephone Encounter (Signed)
LM for patient stating he will need a stress test for cardiac clearance prior to surgery (05/31/2013). Test ordered by Samara Deist, RN & message sent to scheduler to set this up

## 2013-04-23 ENCOUNTER — Telehealth (HOSPITAL_COMMUNITY): Payer: Self-pay | Admitting: *Deleted

## 2013-04-23 ENCOUNTER — Encounter: Payer: Self-pay | Admitting: *Deleted

## 2013-04-23 ENCOUNTER — Encounter (HOSPITAL_COMMUNITY): Payer: Self-pay | Admitting: *Deleted

## 2013-04-23 NOTE — Telephone Encounter (Signed)
There was some miscommunication.  A myoview is not needed.  Mr Corey Fischer just had one done in aug 2014.  Dr Allyson Sabal reviewed the [facility] and cleared patient for the surgery on his hip.  Letter drafted and faxed to [facility]. Clearing patient for surgery.  Left message for patient with instructions and explanation.

## 2013-04-23 NOTE — Telephone Encounter (Signed)
Let us know what you want [place] to do

## 2013-04-26 ENCOUNTER — Telehealth: Payer: Self-pay | Admitting: Cardiovascular Disease

## 2013-04-26 NOTE — Telephone Encounter (Signed)
Wanted you to know he received  your message that his stress test was cancelled

## 2013-05-06 ENCOUNTER — Encounter (HOSPITAL_COMMUNITY): Payer: Medicare Other

## 2013-05-07 ENCOUNTER — Encounter: Payer: Self-pay | Admitting: Internal Medicine

## 2013-05-07 ENCOUNTER — Ambulatory Visit (INDEPENDENT_AMBULATORY_CARE_PROVIDER_SITE_OTHER): Payer: Medicare Other | Admitting: Internal Medicine

## 2013-05-07 VITALS — BP 110/66 | HR 72 | Temp 98.0°F | Resp 16 | Ht 67.0 in | Wt 147.0 lb

## 2013-05-07 DIAGNOSIS — I10 Essential (primary) hypertension: Secondary | ICD-10-CM

## 2013-05-07 DIAGNOSIS — E785 Hyperlipidemia, unspecified: Secondary | ICD-10-CM | POA: Diagnosis not present

## 2013-05-07 DIAGNOSIS — N401 Enlarged prostate with lower urinary tract symptoms: Secondary | ICD-10-CM

## 2013-05-07 DIAGNOSIS — I6529 Occlusion and stenosis of unspecified carotid artery: Secondary | ICD-10-CM | POA: Diagnosis not present

## 2013-05-07 DIAGNOSIS — I658 Occlusion and stenosis of other precerebral arteries: Secondary | ICD-10-CM | POA: Diagnosis not present

## 2013-05-07 DIAGNOSIS — Z23 Encounter for immunization: Secondary | ICD-10-CM

## 2013-05-07 DIAGNOSIS — G4700 Insomnia, unspecified: Secondary | ICD-10-CM

## 2013-05-07 DIAGNOSIS — T887XXA Unspecified adverse effect of drug or medicament, initial encounter: Secondary | ICD-10-CM

## 2013-05-07 DIAGNOSIS — Z0000 Encounter for general adult medical examination without abnormal findings: Secondary | ICD-10-CM | POA: Diagnosis not present

## 2013-05-07 DIAGNOSIS — I6523 Occlusion and stenosis of bilateral carotid arteries: Secondary | ICD-10-CM

## 2013-05-07 LAB — POCT URINALYSIS DIPSTICK
Glucose, UA: NEGATIVE
Nitrite, UA: NEGATIVE
Urobilinogen, UA: 0.2

## 2013-05-07 LAB — LIPID PANEL
Cholesterol: 152 mg/dL (ref 0–200)
LDL Cholesterol: 74 mg/dL (ref 0–99)
VLDL: 34.6 mg/dL (ref 0.0–40.0)

## 2013-05-07 LAB — BASIC METABOLIC PANEL
BUN: 18 mg/dL (ref 6–23)
GFR: 73.17 mL/min (ref >60.00)
Potassium: 4.6 mEq/L (ref 3.5–5.1)
Sodium: 139 mEq/L (ref 135–145)

## 2013-05-07 LAB — HEPATIC FUNCTION PANEL
AST: 19 U/L (ref 0–37)
Total Bilirubin: 0.8 mg/dL (ref 0.3–1.2)

## 2013-05-07 LAB — TSH: TSH: 1.24 u[IU]/mL (ref 0.35–5.50)

## 2013-05-07 LAB — CBC WITH DIFFERENTIAL/PLATELET
Basophils Absolute: 0 10*3/uL (ref 0.0–0.1)
Basophils Relative: 0.3 % (ref 0.0–3.0)
Eosinophils Absolute: 0.1 10*3/uL (ref 0.0–0.7)
Lymphocytes Relative: 21.3 % (ref 12.0–46.0)
MCHC: 34 g/dL (ref 30.0–36.0)
Neutrophils Relative %: 69.5 % (ref 43.0–77.0)
RBC: 5.13 Mil/uL (ref 4.22–5.81)
RDW: 13.5 % (ref 11.5–14.6)

## 2013-05-07 LAB — PSA: PSA: 4.49 ng/mL — ABNORMAL HIGH (ref 0.10–4.00)

## 2013-05-07 MED ORDER — DIAZEPAM 5 MG PO TABS: 5.0000 mg | ORAL_TABLET | Freq: Every evening | ORAL | Status: DC | PRN

## 2013-05-07 NOTE — Patient Instructions (Signed)
The patient is instructed to continue all medications as prescribed. Schedule followup with check out clerk upon leaving the clinic  

## 2013-05-07 NOTE — Addendum Note (Signed)
Addended by: Willy Eddy on: 05/07/2013 12:59 PM   Modules accepted: Orders

## 2013-05-07 NOTE — Progress Notes (Signed)
Subjective:    Patient ID: Corey Fischer, male    DOB: 13-Jul-1931, 79 y.o.   MRN: [id number]  HPI Is a 79 year old male with a history of benign prostatic hypertrophy bilateral 30-40% carotid artery stenosis history of hyperlipidemia hypertension and a history of second-degree AV block   Review of Systems  Constitutional: Positive for fatigue. Negative for fever.  HENT: Negative for congestion, hearing loss and postnasal drip.   Eyes: Negative for discharge, redness and visual disturbance.  Respiratory: Negative for cough, shortness of breath and wheezing.   Cardiovascular: Negative for leg swelling.  Gastrointestinal: Negative for abdominal pain, constipation and abdominal distention.  Genitourinary: Negative for urgency and frequency.  Musculoskeletal: Negative for arthralgias, joint swelling and neck pain.  Skin: Negative for color change and rash.  Neurological: Negative for weakness and light-headedness.  Hematological: Negative for adenopathy.  Psychiatric/Behavioral: Negative for behavioral problems.   Past Medical History  Diagnosis Date  . Chronic ITP (idiopathic thrombocytopenia)   . Hx of adenomatous colonic polyps 2010  . Diverticulosis   . GERD (gastroesophageal reflux disease)   . Allergy   . Hyperlipidemia   . Hypertension   . Elevated PSA   . Anxiety   . Depression   . Stricture and stenosis of esophagus   . Gastritis   . Hemorrhoids   . History of hiatal hernia   . Raynaud's syndrome   . Gastric polyp   . Asymptomatic bilateral carotid artery stenosis per duplex 05-26-2007--  bil. ica 40-59%  . Constipation   . Head cold   . Arthritis   . Osteoarthritis hips  . BPH (benign prostatic hyperplasia)   . Nocturia   . B12 deficiency   . Palpitations     monitor 05/2012- NSR with symptomatic PVC; echo 06-17-12- EF 55-60%aoric sclerosis without stenosis,     History   Social History  . Marital Status: Married    Spouse Name: N/A    Number of Children: 2   . Years of Education: N/A   Occupational History  . retired    Social History Main Topics  . Smoking status: Never Smoker   . Smokeless tobacco: Never Used  . Alcohol Use: Yes     Comment: occasionally  . Drug Use: No  . Sexual Activity: Yes   Other Topics Concern  . Not on file   Social History Narrative  . No narrative on file    Past Surgical History  Procedure Laterality Date  . Rotator cuff repair  1988    RIGHT  . Tonsillectomy  age 73  . Appendectomy  age 51  . Laparoscopic inguinal hernia repair  09-19-2010    BILATERAL  . Left shoulder surg.  2007  . Cholecystectomy  1989  . Carotid duplex  05-26-2007    BILATERAL ICA  40-59%  . Transurethral resection of prostate  07/22/2011    Procedure: TRANSURETHRAL RESECTION OF THE PROSTATE WITH GYRUS INSTRUMENTS;  Surgeon: Valetta Fuller, MD;  Location: [facility];  Service: Urology;  Laterality: N/A;  Saline Gyrus TURP  1 hour requested for this procedure  GYRUS    Family History  Problem Relation Age of Onset  . Heart disease Mother   . Heart disease Father   . Colon cancer Neg Hx   . Colon polyps Mother   . Cancer Maternal Grandmother   . Heart failure Maternal Grandfather     Allergies  Allergen Reactions  . Ciprofloxacin  Current Outpatient Prescriptions on File Prior to Visit  Medication Sig Dispense Refill  . amLODipine (NORVASC) 2.5 MG tablet TAKE 1 TABLET BY MOUTH EVERY DAY  90 tablet  3  . Ascorbic Acid (VITAMIN C) 1000 MG tablet Take 1,000 mg by mouth daily.       [name] aspirin 81 MG tablet Take 81 mg by mouth daily.      . Calcium 500 MG CHEW Chew 500 mg by mouth daily.      . cetirizine (ZYRTEC) 10 MG tablet Take 10 mg by mouth daily.       . Cholecalciferol (VITAMIN D3) 1000 UNITS CAPS Take 2,000 Units by mouth daily.       . hydrocortisone-pramoxine (ANALPRAM-HC) 2.5-1 % rectal cream APPLY RECTALY AT BEDTIME AS NEEDED  28.35 g  11  . magnesium oxide (MAG-OX) 400 MG tablet  Take 400 mg by mouth daily.      . polyethylene glycol (MIRALAX / GLYCOLAX) packet Take 17 g by mouth daily.       . promethazine (PHENERGAN) 25 MG suppository Place 1 suppository (25 mg total) rectally every 6 (six) hours as needed for nausea.  12 each  0  . RABEprazole (ACIPHEX) 20 MG tablet TAKE 1 TABLET BY MOUTH TWICE DAILY  180 tablet  3  . senna (SENOKOT) 8.6 MG tablet Take 1 tablet by mouth as needed.      . simvastatin (ZOCOR) 20 MG tablet        No current facility-administered medications on file prior to visit.    BP 110/66  Pulse 72  Temp(Src) 98 F (36.7 C)  Resp 16  Ht 5\' 7"  (1.702 m)  Wt 147 lb (66.679 kg)  BMI 23.02 kg/m2        Objective:   Physical Exam  Nursing note and vitals reviewed. Constitutional: He is oriented to person, place, and time. He appears well-developed and well-nourished.  HENT:  Head: Normocephalic and atraumatic.  Eyes: Conjunctivae are normal. Pupils are equal, round, and reactive to light.  Neck: Normal range of motion. Neck supple.  Cardiovascular: Normal rate and regular rhythm.   Murmur heard. Pulmonary/Chest: Effort normal and breath sounds normal.  Abdominal: Soft. Bowel sounds are normal.  Neurological: He is alert and oriented to person, place, and time.  Skin: Skin is warm and dry.  Psychiatric: He has a normal mood and affect. His behavior is normal.          Assessment & Plan:  BPH  Stable symptoms of 2-3 nocturia CAD  Constipation treated with the miralax Stable HTN Lipid management with monitoring of profile Hip replacement in one months  Cardizem results noted with heart block Palpitations better off the caffeine  The cardiology PA cut the statin therapy in 1/2   Carotids will be monitored next month Labs will be reveiwed with patient    Subjective:    Corey Fischer is a 79 y.o. male who presents for Medicare Annual/Subsequent preventive examination.   Preventive Screening-Counseling &  Management  Tobacco History  Smoking status  . Never Smoker   Smokeless tobacco  . Never Used    Problems Prior to Visit 1.   Current Problems (verified) Patient Active Problem List   Diagnosis Date Noted  . 2Nd degree AV block- Mobitz1 12/10/2012  . PVC's (premature ventricular contractions) 12/10/2012  . Bradycardia 12/10/2012  . PAC (premature atrial contraction), symptomatic with palpatataions 10/16/2012  . BPH (benign prostatic hyperplasia) 07/22/2011  . DIVERTICULOSIS-COLON 03/16/2010  .  OSTEOARTHRITIS 03/16/2010  . IDIOPATHIC OSTEOPOROSIS 07/06/2009  . RAYNAUD'S SYNDROME 06/02/2009  . PROSTATITIS, RECURRENT 03/01/2009  . CONSTIPATION, CHRONIC 07/28/2008  . THROMBOCYTHEMIA 04/21/2008  . CONSTIPATION 03/07/2008  . ESOPHAGEAL STRICTURE 03/04/2008  . COLONIC POLYPS, HX OF 03/04/2008  . CAROTID ARTERY STENOSIS, BILATERAL 11/05/2007  . UNSPECIFIED VISUAL LOSS 10/30/2007  . INTERNAL HEMORRHOIDS WITHOUT MENTION COMP 08/14/2007  . HYPERTENSION 04/29/2007  . INGUINAL HERNIA, RIGHT 04/29/2007  . VITAMIN B12 DEFICIENCY 01/26/2007  . HYPERLIPIDEMIA 01/22/2007  . GERD 01/22/2007    Medications Prior to Visit Current Outpatient Prescriptions on File Prior to Visit  Medication Sig Dispense Refill  . amLODipine (NORVASC) 2.5 MG tablet TAKE 1 TABLET BY MOUTH EVERY DAY  90 tablet  3  . Ascorbic Acid (VITAMIN C) 1000 MG tablet Take 1,000 mg by mouth daily.       [name] aspirin 81 MG tablet Take 81 mg by mouth daily.      . Calcium 500 MG CHEW Chew 500 mg by mouth daily.      . cetirizine (ZYRTEC) 10 MG tablet Take 10 mg by mouth daily.       . Cholecalciferol (VITAMIN D3) 1000 UNITS CAPS Take 2,000 Units by mouth daily.       . hydrocortisone-pramoxine (ANALPRAM-HC) 2.5-1 % rectal cream APPLY RECTALY AT BEDTIME AS NEEDED  28.35 g  11  . magnesium oxide (MAG-OX) 400 MG tablet Take 400 mg by mouth daily.      . polyethylene glycol (MIRALAX / GLYCOLAX) packet Take 17 g by mouth daily.        . promethazine (PHENERGAN) 25 MG suppository Place 1 suppository (25 mg total) rectally every 6 (six) hours as needed for nausea.  12 each  0  . RABEprazole (ACIPHEX) 20 MG tablet TAKE 1 TABLET BY MOUTH TWICE DAILY  180 tablet  3  . senna (SENOKOT) 8.6 MG tablet Take 1 tablet by mouth as needed.      . simvastatin (ZOCOR) 20 MG tablet        No current facility-administered medications on file prior to visit.    Current Medications (verified) Current Outpatient Prescriptions  Medication Sig Dispense Refill  . amLODipine (NORVASC) 2.5 MG tablet TAKE 1 TABLET BY MOUTH EVERY DAY  90 tablet  3  . Ascorbic Acid (VITAMIN C) 1000 MG tablet Take 1,000 mg by mouth daily.       [name] aspirin 81 MG tablet Take 81 mg by mouth daily.      . Calcium 500 MG CHEW Chew 500 mg by mouth daily.      . cetirizine (ZYRTEC) 10 MG tablet Take 10 mg by mouth daily.       . Cholecalciferol (VITAMIN D3) 1000 UNITS CAPS Take 2,000 Units by mouth daily.       . Coenzyme Q10 50 MG CAPS Take 1 capsule by mouth daily.      . Cyanocobalamin 2500 MCG SUBL Place 1 tablet under the tongue 3 x daily with food.      . hydrocortisone-pramoxine (ANALPRAM-HC) 2.5-1 % rectal cream APPLY RECTALY AT BEDTIME AS NEEDED  28.35 g  11  . magnesium oxide (MAG-OX) 400 MG tablet Take 400 mg by mouth daily.      . polyethylene glycol (MIRALAX / GLYCOLAX) packet Take 17 g by mouth daily.       . promethazine (PHENERGAN) 25 MG suppository Place 1 suppository (25 mg total) rectally every 6 (six) hours as needed for nausea.  12 each  0  . RABEprazole (ACIPHEX) 20 MG tablet TAKE 1 TABLET BY MOUTH TWICE DAILY  180 tablet  3  . senna (SENOKOT) 8.6 MG tablet Take 1 tablet by mouth as needed.      . simvastatin (ZOCOR) 20 MG tablet        No current facility-administered medications for this visit.     Allergies (verified) Ciprofloxacin   PAST HISTORY  Family History Family History  Problem Relation Age of Onset  . Heart disease Mother    . Heart disease Father   . Colon cancer Neg Hx   . Colon polyps Mother   . Cancer Maternal Grandmother   . Heart failure Maternal Grandfather     Social History History  Substance Use Topics  . Smoking status: Never Smoker   . Smokeless tobacco: Never Used  . Alcohol Use: Yes     Comment: occasionally    Are there smokers in your home (other than you)?  No  Risk Factors Current exercise habits: Gym/ health club routine includes cardio, light weights and low impact aerobics.  Dietary issues discussed: diet   Cardiac risk factors: dyslipidemia, family history of premature cardiovascular disease, hypertension and male gender.  Depression Screen (Note: if answer to either of the following is "Yes", a more complete depression screening is indicated)   Q1: Over the past two weeks, have you felt down, depressed or hopeless? No  Q2: Over the past two weeks, have you felt little interest or pleasure in doing things? No  Have you lost interest or pleasure in daily life? No  Do you often feel hopeless? No  Do you cry easily over simple problems? No  Activities of Daily Living In your present state of health, do you have any difficulty performing the following activities?:  Driving? No Managing money?  No Feeding yourself? No Getting from bed to chair? No Climbing a flight of stairs? No Preparing food and eating?: No Bathing or showering? No Getting dressed: No Getting to the toilet? No Using the toilet:No Moving around from place to place: No In the past year have you fallen or had a near fall?:No   Are you sexually active?  Yes  Do you have more than one partner?  No  Hearing Difficulties: No Do you often ask people to speak up or repeat themselves? No Do you experience ringing or noises in your ears? No Do you have difficulty understanding soft or whispered voices? No   Do you feel that you have a problem with memory? No  Do you often misplace items? No  Do you feel  safe at home?  Yes  Cognitive Testing  Alert? Yes  Normal Appearance?Yes  Oriented to person? Yes  Place? Yes   Time? Yes  Recall of three objects?  Yes  Can perform simple calculations? Yes  Displays appropriate judgment?Yes  Can read the correct time from a watch face?Yes   Advanced Directives have been discussed with the patient? Yes   List the Names of Other Physician/Practitioners you currently use: 1.    Indicate any recent Medical Services you may have received from other than [facility] providers in the past year (date may be approximate).  Immunization History  Administered Date(s) Administered  . H1N1 05/03/2008  . Influenza Split 02/23/2013  . Influenza Whole 05/20/2002, 03/01/2009  . Pneumococcal Polysaccharide-23 05/20/2005  . Td 05/20/2005  . Zoster 05/21/2011    Screening Tests Health Maintenance  Topic Date Due  . Influenza Vaccine  12/18/2013  . Tetanus/tdap  05/21/2015  . Colonoscopy  09/09/2018  . Pneumococcal Polysaccharide Vaccine Age 74 And Over  Completed  . Zostavax  Completed    All answers were reviewed with the patient and necessary referrals were made:  [name], MD   05/07/2013   History reviewed: allergies, current medications, past family history, past medical history, past social history, past surgical history and problem list  Review of Systems Pertinent items are noted in HPI.    Objective:     Vision by Snellen chart: right eye:20/20, left eye:20/20 Blood pressure 110/66, pulse 72, temperature 98 F (36.7 C), resp. rate 16, height 5\' 7"  (1.702 m), weight 147 lb (66.679 kg). Body mass index is 23.02 kg/(m^2).  BP 110/66  Pulse 72  Temp(Src) 98 F (36.7 C)  Resp 16  Ht 5\' 7"  (1.702 m)  Wt 147 lb (66.679 kg)  BMI 23.02 kg/m2  General Appearance:    Alert, cooperative, no distress, appears stated age  Head:    Normocephalic, without obvious abnormality, atraumatic  Eyes:    PERRL, conjunctiva/corneas clear, EOM's  intact, fundi    benign, both eyes       Ears:    Normal TM's and external ear canals, both ears  Nose:   Nares normal, septum midline, mucosa normal, no drainage    or sinus tenderness  Throat:   Lips, mucosa, and tongue normal; teeth and gums normal  Neck:   Supple, symmetrical, trachea midline, no adenopathy;       thyroid:  No enlargement/tenderness/nodules; no carotid   bruit or JVD  Back:     Symmetric, no curvature, ROM normal, no CVA tenderness  Lungs:     Clear to auscultation bilaterally, respirations unlabored  Chest wall:    No tenderness or deformity  Heart:    Regular rate and rhythm, S1 and S2 normal, no murmur, rub   or gallop  Abdomen:     Soft, non-tender, bowel sounds active all four quadrants,    no masses, no organomegaly  Genitalia:    Normal male without lesion, discharge or tenderness  Rectal:    Normal tone, normal prostate, no masses or tenderness;   guaiac negative stool  Extremities:   Extremities normal, atraumatic, no cyanosis or edema  Pulses:   2+ and symmetric all extremities  Skin:   Skin color, texture, turgor normal, no rashes or lesions  Lymph nodes:   Cervical, supraclavicular, and axillary nodes normal  Neurologic:   CNII-XII intact. Normal strength, sensation and reflexes      throughout       Assessment:      Patient presents for yearly preventative medicine examination.   all immunizations and health maintenance protocols were reviewed with the patient and they are up to date with these protocols.   screening laboratory values were reviewed with the patient including screening of hyperlipidemia PSA renal function and hepatic function.   There medications past medical history social history problem list and allergies were reviewed in detail.   Goals were established with regard to weight loss exercise diet in compliance with medications      Plan:     During the course of the visit the patient was educated and counseled about  appropriate screening and preventive services including:    Pneumococcal vaccine   Influenza vaccine  prevnar  Diet review for nutrition referral? Yes ____  Not Indicated ____   Patient Instructions (the written plan) was given to the  patient.  Medicare Attestation I have personally reviewed: The patient's medical and social history Their use of alcohol, tobacco or illicit drugs Their current medications and supplements The patient's functional ability including ADLs,fall risks, home safety risks, cognitive, and hearing and visual impairment Diet and physical activities Evidence for depression or mood disorders  The patient's weight, height, BMI, and visual acuity have been recorded in the chart.  I have made referrals, counseling, and provided education to the patient based on review of the above and I have provided the patient with a written personalized care plan for preventive services.    Possible need for repeat EGD due to progressive dyspahgia  [name], MD   05/07/2013

## 2013-05-07 NOTE — Progress Notes (Signed)
Pre visit review using our clinic review tool, if applicable. No additional management support is needed unless otherwise documented below in the visit note. 

## 2013-05-18 ENCOUNTER — Encounter (HOSPITAL_COMMUNITY): Payer: Self-pay

## 2013-05-18 ENCOUNTER — Other Ambulatory Visit: Payer: Self-pay | Admitting: Orthopedic Surgery

## 2013-05-20 HISTORY — PX: CATARACT EXTRACTION W/ INTRAOCULAR LENS IMPLANT: SHX1309

## 2013-05-24 ENCOUNTER — Encounter: Payer: Self-pay | Admitting: Internal Medicine

## 2013-05-24 DIAGNOSIS — R972 Elevated prostate specific antigen [PSA]: Secondary | ICD-10-CM

## 2013-05-25 ENCOUNTER — Other Ambulatory Visit (HOSPITAL_COMMUNITY): Payer: Self-pay | Admitting: Cardiovascular Disease

## 2013-05-25 ENCOUNTER — Ambulatory Visit (HOSPITAL_COMMUNITY)
Admission: RE | Admit: 2013-05-25 | Discharge: 2013-05-25 | Disposition: A | Discharge status: Home / Self Care | Payer: Medicare Other | Source: Ambulatory Visit | Attending: Cardiovascular Disease | Admitting: Cardiovascular Disease

## 2013-05-25 DIAGNOSIS — I6529 Occlusion and stenosis of unspecified carotid artery: Secondary | ICD-10-CM | POA: Diagnosis not present

## 2013-05-25 DIAGNOSIS — I672 Cerebral atherosclerosis: Secondary | ICD-10-CM | POA: Diagnosis not present

## 2013-05-25 NOTE — Progress Notes (Signed)
Carotid Duplex Completed. Katrina Brosh, BS, RDMS, RVT  

## 2013-05-26 ENCOUNTER — Other Ambulatory Visit (HOSPITAL_COMMUNITY): Payer: Self-pay | Admitting: *Deleted

## 2013-05-26 NOTE — Pre-Procedure Instructions (Signed)
Corey Fischer  05/26/2013   Your procedure is scheduled on:  Monday, May 31, 2013 at 10:05 AM.   Report to [facility] Entrance "A" Admitting Office at 8:00 AM.   Call this number if you have problems the morning of surgery: [phone]   Remember:   Do not eat food or drink liquids after midnight Sunday, 05/30/13.   Take these medicines the morning of surgery with A SIP OF WATER:   cetirizine (ZYRTEC),   diazepam (VALIUM) - if needed.  Stop all herbal medications and Vitamins as of today, 05/27/13.    Do not wear jewelry.  Do not wear lotions, powders, or cologne. You may wear deodorant.  Men may shave face and neck.  Do not bring valuables to the hospital.  [facility] is not responsible                  for any belongings or valuables.               Contacts, dentures or bridgework may not be worn into surgery.  Leave suitcase in the car. After surgery it may be brought to your room.  For patients admitted to the hospital, discharge time is determined by your                treatment team.                 Special Instructions: Shower using CHG 2 nights before surgery and the night before surgery.  If you shower the day of surgery use CHG.  Use special wash - you have one bottle of CHG for all showers.  You should use approximately 1/3 of the bottle for each shower.   Please read over the following fact sheets that you were given: Pain Booklet, Coughing and Deep Breathing, Blood Transfusion Information, MRSA Information and Surgical Site Infection Prevention

## 2013-05-27 ENCOUNTER — Encounter (HOSPITAL_COMMUNITY): Payer: Self-pay

## 2013-05-27 ENCOUNTER — Encounter (HOSPITAL_COMMUNITY)
Admission: RE | Admit: 2013-05-27 | Discharge: 2013-05-27 | Disposition: A | Discharge status: Home / Self Care | Payer: Medicare Other | Source: Ambulatory Visit | Attending: Orthopedic Surgery | Admitting: Orthopedic Surgery

## 2013-05-27 DIAGNOSIS — Z01818 Encounter for other preprocedural examination: Secondary | ICD-10-CM | POA: Insufficient documentation

## 2013-05-27 DIAGNOSIS — Z01812 Encounter for preprocedural laboratory examination: Secondary | ICD-10-CM | POA: Insufficient documentation

## 2013-05-27 HISTORY — DX: Personal history of other diseases of the digestive system: Z87.19

## 2013-05-27 LAB — APTT: APTT: 27 s (ref 24–37)

## 2013-05-27 LAB — CBC WITH DIFFERENTIAL/PLATELET
BASOS ABS: 0 10*3/uL (ref 0.0–0.1)
Basophils Relative: 1 % (ref 0–1)
EOS ABS: 0.2 10*3/uL (ref 0.0–0.7)
EOS PCT: 3 % (ref 0–5)
HCT: 43.5 % (ref 39.0–52.0)
Hemoglobin: 14.9 g/dL (ref 13.0–17.0)
LYMPHS PCT: 35 % (ref 12–46)
Lymphs Abs: 2.1 10*3/uL (ref 0.7–4.0)
MCH: 30.6 pg (ref 26.0–34.0)
MCHC: 34.3 g/dL (ref 30.0–36.0)
MCV: 89.3 fL (ref 78.0–100.0)
Monocytes Absolute: 0.6 10*3/uL (ref 0.1–1.0)
Monocytes Relative: 10 % (ref 3–12)
Neutro Abs: 3.1 10*3/uL (ref 1.7–7.7)
Neutrophils Relative %: 51 % (ref 43–77)
PLATELETS: 136 10*3/uL — AB (ref 150–400)
RBC: 4.87 MIL/uL (ref 4.22–5.81)
RDW: 13.2 % (ref 11.5–15.5)
WBC: 5.9 10*3/uL (ref 4.0–10.5)

## 2013-05-27 LAB — BASIC METABOLIC PANEL
BUN: 14 mg/dL (ref 6–23)
CALCIUM: 9.3 mg/dL (ref 8.4–10.5)
CO2: 27 mEq/L (ref 19–32)
Chloride: 100 mEq/L (ref 96–112)
Creatinine, Ser: 1.03 mg/dL (ref 0.50–1.35)
GFR calc Af Amer: 78 mL/min — ABNORMAL LOW (ref >90)
GFR, EST NON AFRICAN AMERICAN: 67 mL/min — AB (ref >90)
Glucose, Bld: 100 mg/dL — ABNORMAL HIGH (ref 70–99)
Potassium: 4.5 mEq/L (ref 3.7–5.3)
SODIUM: 139 meq/L (ref 137–147)

## 2013-05-27 LAB — TYPE AND SCREEN
ABO/RH(D): O POS
Antibody Screen: NEGATIVE

## 2013-05-27 LAB — URINALYSIS, ROUTINE W REFLEX MICROSCOPIC
BILIRUBIN URINE: NEGATIVE
Glucose, UA: NEGATIVE mg/dL
HGB URINE DIPSTICK: NEGATIVE
Ketones, ur: NEGATIVE mg/dL
Leukocytes, UA: NEGATIVE
Nitrite: NEGATIVE
Protein, ur: NEGATIVE mg/dL
SPECIFIC GRAVITY, URINE: 1.008 (ref 1.005–1.030)
Urobilinogen, UA: 0.2 mg/dL (ref 0.0–1.0)
pH: 7 (ref 5.0–8.0)

## 2013-05-27 LAB — PROTIME-INR
INR: 0.87 (ref 0.00–1.49)
Prothrombin Time: 11.7 seconds (ref 11.6–15.2)

## 2013-05-27 LAB — SURGICAL PCR SCREEN
MRSA, PCR: NEGATIVE
STAPHYLOCOCCUS AUREUS: NEGATIVE

## 2013-05-27 LAB — ABO/RH: ABO/RH(D): O POS

## 2013-05-27 NOTE — Progress Notes (Signed)
Pt. Reports that he has PVC's, has been cleared for surgery by Dr. Gwenlyn Found,  stress test done -11/2012, echo done - 2014 followed by Orinda Kenner for PCP

## 2013-05-28 NOTE — H&P (Signed)
TOTAL HIP ADMISSION H&P  Patient is admitted for left total hip arthroplasty.  Subjective:  Chief Complaint: left hip pain  HPI: Corey Fischer, 80 y.o. male, has a history of pain and functional disability in the left hip(s) due to arthritis and patient has failed non-surgical conservative treatments for greater than 12 weeks to include NSAID's and/or analgesics, corticosteriod injections, flexibility and strengthening excercises and activity modification.  Onset of symptoms was gradual starting >10 years ago with gradually worsening course since that time.The patient noted no past surgery on the left hip(s).  Patient currently rates pain in the left hip at 10 out of 10 with activity. Patient has worsening of pain with activity and weight bearing, pain that interfers with activities of daily living and pain with passive range of motion. Patient has evidence of periarticular osteophytes and joint space narrowing by imaging studies. This condition presents safety issues increasing the risk of falls.   There is no current active infection.  Patient Active Problem List   Diagnosis Date Noted  . 2Nd degree AV block- Mobitz1 12/10/2012  . PVC's (premature ventricular contractions) 12/10/2012  . Bradycardia 12/10/2012  . PAC (premature atrial contraction), symptomatic with palpatataions 10/16/2012  . BPH (benign prostatic hyperplasia) 07/22/2011  . DIVERTICULOSIS-COLON 03/16/2010  . OSTEOARTHRITIS 03/16/2010  . IDIOPATHIC OSTEOPOROSIS 07/06/2009  . RAYNAUD'S SYNDROME 06/02/2009  . PROSTATITIS, RECURRENT 03/01/2009  . CONSTIPATION, CHRONIC 07/28/2008  . THROMBOCYTHEMIA 04/21/2008  . CONSTIPATION 03/07/2008  . ESOPHAGEAL STRICTURE 03/04/2008  . COLONIC POLYPS, HX OF 03/04/2008  . CAROTID ARTERY STENOSIS, BILATERAL 11/05/2007  . UNSPECIFIED VISUAL LOSS 10/30/2007  . INTERNAL HEMORRHOIDS WITHOUT MENTION COMP 08/14/2007  . HYPERTENSION 04/29/2007  . INGUINAL HERNIA, RIGHT 04/29/2007  . VITAMIN B12  DEFICIENCY 01/26/2007  . HYPERLIPIDEMIA 01/22/2007  . GERD 01/22/2007   Past Medical History  Diagnosis Date  . Chronic ITP (idiopathic thrombocytopenia)   . Hx of adenomatous colonic polyps 2010  . Diverticulosis   . GERD (gastroesophageal reflux disease)   . Allergy   . Hyperlipidemia   . Hypertension   . Elevated PSA   . Anxiety   . Depression   . Stricture and stenosis of esophagus 2010    last endoscopy- 2010  . Gastritis   . Hemorrhoids   . History of hiatal hernia   . Raynaud's syndrome   . Gastric polyp   . Asymptomatic bilateral carotid artery stenosis per duplex 05-26-2007--  bil. ica 40-59%  . Constipation   . Head cold   . Arthritis   . Osteoarthritis hips  . BPH (benign prostatic hyperplasia)   . Nocturia   . B12 deficiency   . Palpitations     monitor 05/2012- NSR with symptomatic PVC; echo 06-17-12- EF 55-60%aoric sclerosis without stenosis,   . Dysrhythmia     PVC's  . History of stress test 11/2012    pt. cleared for surgery based on good stress test result    . H/O hiatal hernia     Past Surgical History  Procedure Laterality Date  . Rotator cuff repair  1988    RIGHT  . Tonsillectomy  age 7  . Appendectomy  age 19  . Laparoscopic inguinal hernia repair  09-19-2010    BILATERAL  . Left shoulder surg.  2007  . Cholecystectomy  1989  . Carotid duplex  05-26-2007    BILATERAL ICA  40-59%  . Transurethral resection of prostate  07/22/2011    Procedure: TRANSURETHRAL RESECTION OF THE PROSTATE WITH GYRUS INSTRUMENTS;  Surgeon: Bernestine Amass, MD;  Location: [facility];  Service: Urology;  Laterality: N/A;  Saline Gyrus TURP  1 hour requested for this procedure  GYRUS  . Cardiac catheterization  1989    Told that he is "pristine "    No prescriptions prior to admission   Allergies  Allergen Reactions  . Ciprofloxacin     History  Substance Use Topics  . Smoking status: Never Smoker   . Smokeless tobacco: Never Used  .  Alcohol Use: Yes     Comment: occasionally    Family History  Problem Relation Age of Onset  . Heart disease Mother   . Heart disease Father   . Colon cancer Neg Hx   . Colon polyps Mother   . Cancer Maternal Grandmother   . Heart failure Maternal Grandfather      Review of Systems  Constitutional: Negative.   HENT: Negative.   Eyes: Negative.   Respiratory: Negative.   Cardiovascular:       He does have a history of PVCs and is followed by Dr. Donnella Bi.    Gastrointestinal: Negative.   Genitourinary: Negative.   Musculoskeletal: Positive for joint pain.  Skin: Negative.   Neurological: Negative.   Endo/Heme/Allergies: Negative.   Psychiatric/Behavioral: Negative.     Objective:  Physical Exam  Constitutional: He is oriented to person, place, and time. He appears well-developed and well-nourished.  HENT:  Head: Normocephalic and atraumatic.  Eyes: Pupils are equal, round, and reactive to light.  Neck: Normal range of motion. Neck supple.  Cardiovascular: Intact distal pulses.   Respiratory: Effort normal.  Musculoskeletal: He exhibits tenderness.  Neurological: He is alert and oriented to person, place, and time.  Skin: Skin is warm and dry.  Psychiatric: He has a normal mood and affect. His behavior is normal. Judgment and thought content normal.    Vital signs in last 24 hours:    Labs:   Estimated body mass index is 23.02 kg/(m^2) as calculated from the following:   Height as of 05/07/13: 5\' 7"  (1.702 m).   Weight as of 05/07/13: 66.679 kg (147 lb).   Imaging Review Radiographs:  X-rays were ordered, performed, and interpreted by me today included; AP pelvis and crosstable lateral show peripheral osteophytes to the femoral head, less so the acetabulum loss of about one half the articular cartilage on x-ray consistent with the severe arthritis found on the MRI scan previously.  Assessment/Plan:  End stage arthritis, left hip(s)  The patient  history, physical examination, clinical judgement of the provider and imaging studies are consistent with end stage degenerative joint disease of the left hip(s) and total hip arthroplasty is deemed medically necessary. The treatment options including medical management, injection therapy, arthroscopy and arthroplasty were discussed at length. The risks and benefits of total hip arthroplasty were presented and reviewed. The risks due to aseptic loosening, infection, stiffness, dislocation/subluxation,  thromboembolic complications and other imponderables were discussed.  The patient acknowledged the explanation, agreed to proceed with the plan and consent was signed. Patient is being admitted for inpatient treatment for surgery, pain control, PT, OT, prophylactic antibiotics, VTE prophylaxis, progressive ambulation and ADL's and discharge planning.The patient is planning to be discharged to skilled nursing facility

## 2013-05-30 ENCOUNTER — Other Ambulatory Visit: Payer: Self-pay

## 2013-05-30 ENCOUNTER — Other Ambulatory Visit: Payer: Self-pay | Admitting: Physician Assistant

## 2013-05-30 ENCOUNTER — Emergency Department (HOSPITAL_COMMUNITY)
Admission: EM | Admit: 2013-05-30 | Discharge: 2013-05-30 | Disposition: A | Discharge status: Home / Self Care | Payer: Medicare Other | Attending: Emergency Medicine | Admitting: Emergency Medicine

## 2013-05-30 ENCOUNTER — Emergency Department (HOSPITAL_COMMUNITY)
Admission: EM | Admit: 2013-05-30 | Discharge: 2013-05-30 | Disposition: A | Discharge status: Hospice | Payer: Medicare Other | Source: Home / Self Care

## 2013-05-30 ENCOUNTER — Encounter (HOSPITAL_COMMUNITY): Payer: Self-pay | Admitting: Emergency Medicine

## 2013-05-30 ENCOUNTER — Telehealth: Payer: Self-pay | Admitting: Physician Assistant

## 2013-05-30 DIAGNOSIS — E785 Hyperlipidemia, unspecified: Secondary | ICD-10-CM | POA: Insufficient documentation

## 2013-05-30 DIAGNOSIS — M818 Other osteoporosis without current pathological fracture: Secondary | ICD-10-CM | POA: Diagnosis present

## 2013-05-30 DIAGNOSIS — K5730 Diverticulosis of large intestine without perforation or abscess without bleeding: Secondary | ICD-10-CM | POA: Diagnosis not present

## 2013-05-30 DIAGNOSIS — F411 Generalized anxiety disorder: Secondary | ICD-10-CM | POA: Insufficient documentation

## 2013-05-30 DIAGNOSIS — M169 Osteoarthritis of hip, unspecified: Secondary | ICD-10-CM | POA: Diagnosis not present

## 2013-05-30 DIAGNOSIS — D693 Immune thrombocytopenic purpura: Secondary | ICD-10-CM | POA: Insufficient documentation

## 2013-05-30 DIAGNOSIS — I4949 Other premature depolarization: Secondary | ICD-10-CM | POA: Diagnosis not present

## 2013-05-30 DIAGNOSIS — K219 Gastro-esophageal reflux disease without esophagitis: Secondary | ICD-10-CM | POA: Insufficient documentation

## 2013-05-30 DIAGNOSIS — N400 Enlarged prostate without lower urinary tract symptoms: Secondary | ICD-10-CM | POA: Diagnosis not present

## 2013-05-30 DIAGNOSIS — Z79899 Other long term (current) drug therapy: Secondary | ICD-10-CM | POA: Insufficient documentation

## 2013-05-30 DIAGNOSIS — Z96649 Presence of unspecified artificial hip joint: Secondary | ICD-10-CM | POA: Diagnosis not present

## 2013-05-30 DIAGNOSIS — M1990 Unspecified osteoarthritis, unspecified site: Secondary | ICD-10-CM | POA: Insufficient documentation

## 2013-05-30 DIAGNOSIS — Z9889 Other specified postprocedural states: Secondary | ICD-10-CM | POA: Insufficient documentation

## 2013-05-30 DIAGNOSIS — R002 Palpitations: Secondary | ICD-10-CM | POA: Diagnosis not present

## 2013-05-30 DIAGNOSIS — M25559 Pain in unspecified hip: Secondary | ICD-10-CM | POA: Diagnosis not present

## 2013-05-30 DIAGNOSIS — Z8249 Family history of ischemic heart disease and other diseases of the circulatory system: Secondary | ICD-10-CM | POA: Diagnosis not present

## 2013-05-30 DIAGNOSIS — Z862 Personal history of diseases of the blood and blood-forming organs and certain disorders involving the immune mechanism: Secondary | ICD-10-CM | POA: Insufficient documentation

## 2013-05-30 DIAGNOSIS — I498 Other specified cardiac arrhythmias: Secondary | ICD-10-CM | POA: Insufficient documentation

## 2013-05-30 DIAGNOSIS — M1610 Unilateral primary osteoarthritis, unspecified hip: Secondary | ICD-10-CM | POA: Diagnosis not present

## 2013-05-30 DIAGNOSIS — I491 Atrial premature depolarization: Secondary | ICD-10-CM | POA: Diagnosis present

## 2013-05-30 DIAGNOSIS — Z87448 Personal history of other diseases of urinary system: Secondary | ICD-10-CM | POA: Insufficient documentation

## 2013-05-30 DIAGNOSIS — I7300 Raynaud's syndrome without gangrene: Secondary | ICD-10-CM | POA: Insufficient documentation

## 2013-05-30 DIAGNOSIS — I6529 Occlusion and stenosis of unspecified carotid artery: Secondary | ICD-10-CM | POA: Diagnosis not present

## 2013-05-30 DIAGNOSIS — F329 Major depressive disorder, single episode, unspecified: Secondary | ICD-10-CM | POA: Diagnosis present

## 2013-05-30 DIAGNOSIS — E538 Deficiency of other specified B group vitamins: Secondary | ICD-10-CM | POA: Diagnosis not present

## 2013-05-30 DIAGNOSIS — Z7982 Long term (current) use of aspirin: Secondary | ICD-10-CM | POA: Insufficient documentation

## 2013-05-30 DIAGNOSIS — Z8371 Family history of colonic polyps: Secondary | ICD-10-CM | POA: Diagnosis not present

## 2013-05-30 DIAGNOSIS — I658 Occlusion and stenosis of other precerebral arteries: Secondary | ICD-10-CM | POA: Diagnosis present

## 2013-05-30 DIAGNOSIS — Z881 Allergy status to other antibiotic agents status: Secondary | ICD-10-CM | POA: Diagnosis not present

## 2013-05-30 DIAGNOSIS — I499 Cardiac arrhythmia, unspecified: Secondary | ICD-10-CM

## 2013-05-30 DIAGNOSIS — I10 Essential (primary) hypertension: Secondary | ICD-10-CM | POA: Diagnosis present

## 2013-05-30 DIAGNOSIS — F3289 Other specified depressive episodes: Secondary | ICD-10-CM | POA: Diagnosis present

## 2013-05-30 DIAGNOSIS — Z86010 Personal history of colonic polyps: Secondary | ICD-10-CM | POA: Diagnosis not present

## 2013-05-30 DIAGNOSIS — Z471 Aftercare following joint replacement surgery: Secondary | ICD-10-CM | POA: Diagnosis not present

## 2013-05-30 DIAGNOSIS — Z9089 Acquired absence of other organs: Secondary | ICD-10-CM | POA: Diagnosis not present

## 2013-05-30 LAB — COMPREHENSIVE METABOLIC PANEL
ALK PHOS: 66 U/L (ref 39–117)
ALT: 18 U/L (ref 0–53)
AST: 19 U/L (ref 0–37)
Albumin: 4 g/dL (ref 3.5–5.2)
BUN: 13 mg/dL (ref 6–23)
CHLORIDE: 102 meq/L (ref 96–112)
CO2: 29 mEq/L (ref 19–32)
Calcium: 9.4 mg/dL (ref 8.4–10.5)
Creatinine, Ser: 1.08 mg/dL (ref 0.50–1.35)
GFR calc non Af Amer: 63 mL/min — ABNORMAL LOW (ref >90)
GFR, EST AFRICAN AMERICAN: 73 mL/min — AB (ref >90)
Glucose, Bld: 94 mg/dL (ref 70–99)
POTASSIUM: 5.3 meq/L (ref 3.7–5.3)
SODIUM: 141 meq/L (ref 137–147)
TOTAL PROTEIN: 6.9 g/dL (ref 6.0–8.3)
Total Bilirubin: 0.4 mg/dL (ref 0.3–1.2)

## 2013-05-30 LAB — CBC
HCT: 44 % (ref 39.0–52.0)
Hemoglobin: 15.1 g/dL (ref 13.0–17.0)
MCH: 30.6 pg (ref 26.0–34.0)
MCHC: 34.3 g/dL (ref 30.0–36.0)
MCV: 89.2 fL (ref 78.0–100.0)
Platelets: 145 10*3/uL — ABNORMAL LOW (ref 150–400)
RBC: 4.93 MIL/uL (ref 4.22–5.81)
RDW: 13.2 % (ref 11.5–15.5)
WBC: 5.3 10*3/uL (ref 4.0–10.5)

## 2013-05-30 MED ORDER — DEXTROSE-NACL 5-0.45 % IV SOLN: INTRAVENOUS | Status: DC

## 2013-05-30 MED ORDER — PROPRANOLOL HCL 20 MG PO TABS: 20.0000 mg | ORAL_TABLET | ORAL | Status: DC | PRN

## 2013-05-30 MED ORDER — CHLORHEXIDINE GLUCONATE 4 % EX LIQD: 60.0000 mL | Freq: Once | CUTANEOUS | Status: DC

## 2013-05-30 MED ORDER — CEFAZOLIN SODIUM-DEXTROSE 2-3 GM-% IV SOLR
2.0000 g | INTRAVENOUS | Status: AC
  Administered 2013-05-31: 2 g via INTRAVENOUS
  Filled 2013-05-30: qty 50

## 2013-05-30 NOTE — ED Notes (Addendum)
C/o irregular heart beat  Did call PCP and received propanolol States a year ago he was on diltiazem but stopped Is still taking Mag. daily

## 2013-05-30 NOTE — ED Provider Notes (Signed)
CSN: [id number]     Arrival date & time 05/30/13  1721 History   None    Chief Complaint  Patient presents with  . Irregular Heart Beat   (Consider location/radiation/quality/duration/timing/severity/associated sxs/prior Treatment) Patient is a 80 y.o. male presenting with palpitations. The history is provided by the patient and the spouse.  Palpitations Palpitations quality:  Irregular Onset quality:  Sudden Progression:  Worsening Chronicity:  Chronic Context: anxiety   Context comment:  Having shoulder surg in am, noticed rapid palp of heart today, h/o pvc and wenkebach arrhythmia, , no pain sob or dizziness. Ineffective treatments:  Beta blockers Associated symptoms: no chest pain   Risk factors: heart disease     Past Medical History  Diagnosis Date  . Chronic ITP (idiopathic thrombocytopenia)   . Hx of adenomatous colonic polyps 2010  . Diverticulosis   . GERD (gastroesophageal reflux disease)   . Allergy   . Hyperlipidemia   . Hypertension   . Elevated PSA   . Anxiety   . Depression   . Stricture and stenosis of esophagus 2010    last endoscopy- 2010  . Gastritis   . Hemorrhoids   . History of hiatal hernia   . Raynaud's syndrome   . Gastric polyp   . Asymptomatic bilateral carotid artery stenosis per duplex 05-26-2007--  bil. ica 40-59%  . Constipation   . Head cold   . Arthritis   . Osteoarthritis hips  . BPH (benign prostatic hyperplasia)   . Nocturia   . B12 deficiency   . Palpitations     monitor 05/2012- NSR with symptomatic PVC; echo 06-17-12- EF 55-60%aoric sclerosis without stenosis,   . Dysrhythmia     PVC's  . History of stress test 11/2012    pt. cleared for surgery based on good stress test result    . H/O hiatal hernia    Past Surgical History  Procedure Laterality Date  . Rotator cuff repair  1988    RIGHT  . Tonsillectomy  age 69  . Appendectomy  age 37  . Laparoscopic inguinal hernia repair  09-19-2010    BILATERAL  . Left shoulder  surg.  2007  . Cholecystectomy  1989  . Carotid duplex  05-26-2007    BILATERAL ICA  40-59%  . Transurethral resection of prostate  07/22/2011    Procedure: TRANSURETHRAL RESECTION OF THE PROSTATE WITH GYRUS INSTRUMENTS;  Surgeon: Bernestine Amass, MD;  Location: [facility];  Service: Urology;  Laterality: N/A;  Saline Gyrus TURP  1 hour requested for this procedure  GYRUS  . Cardiac catheterization  1989    Told that he is "pristine "   Family History  Problem Relation Age of Onset  . Heart disease Mother   . Heart disease Father   . Colon cancer Neg Hx   . Colon polyps Mother   . Cancer Maternal Grandmother   . Heart failure Maternal Grandfather    History  Substance Use Topics  . Smoking status: Never Smoker   . Smokeless tobacco: Never Used  . Alcohol Use: Yes     Comment: occasionally    Review of Systems  Constitutional: Negative.   Respiratory: Negative.   Cardiovascular: Positive for palpitations. Negative for chest pain and leg swelling.    Allergies  Ciprofloxacin  Home Medications   Current Outpatient Rx  Name  Route  Sig  Dispense  Refill  . amLODipine (NORVASC) 2.5 MG tablet   Oral   Take 2.5  mg by mouth at bedtime.          . Ascorbic Acid (VITAMIN C) 1000 MG tablet   Oral   Take 1,000 mg by mouth daily.          [name] aspirin 81 MG tablet   Oral   Take 81 mg by mouth daily.         . Calcium 500 MG CHEW   Oral   Chew 500 mg by mouth daily.         . cetirizine (ZYRTEC) 10 MG tablet   Oral   Take 10 mg by mouth daily with breakfast.          . Cholecalciferol (VITAMIN D3) 1000 UNITS CAPS   Oral   Take 2,000 Units by mouth daily.          . Coenzyme Q10 50 MG CAPS   Oral   Take 50 mg by mouth daily.          . Cyanocobalamin 2500 MCG SUBL   Sublingual   Place 1 tablet under the tongue daily.          . diazepam (VALIUM) 5 MG tablet   Oral   Take 1 tablet (5 mg total) by mouth at bedtime as needed and may  repeat dose one time if needed for anxiety.   30 tablet   2   . Magnesium 200 MG TABS   Oral   Take 200 mg by mouth 2 (two) times daily.         . polyethylene glycol (MIRALAX / GLYCOLAX) packet   Oral   Take 17 g by mouth daily.          . propranolol (INDERAL) 20 MG tablet   Oral   Take 1 tablet (20 mg total) by mouth as needed (for palpitations).   30 tablet   3   . RABEprazole (ACIPHEX) 20 MG tablet   Oral   Take 20 mg by mouth at bedtime.          . senna (SENOKOT) 8.6 MG tablet   Oral   Take 1 tablet by mouth as needed for constipation.          . simvastatin (ZOCOR) 20 MG tablet   Oral   Take 20 mg by mouth daily at 6 PM.          . Zinc 25 MG TABS   Oral   Take 20 mg by mouth daily.           There were no vitals taken for this visit. Physical Exam  Nursing note and vitals reviewed. Constitutional: He appears well-developed and well-nourished.  HENT:  Head: Normocephalic.  Neck: Normal range of motion. Neck supple.  Cardiovascular: Normal rate, normal heart sounds, intact distal pulses and normal pulses.  An irregular rhythm present.  Pulmonary/Chest: Effort normal and breath sounds normal.  Musculoskeletal: He exhibits no edema.  Lymphadenopathy:    He has no cervical adenopathy.  Skin: Skin is warm and dry.    ED Course  Procedures (including critical care time) Labs Review Labs Reviewed - No data to display Imaging Review No results found.  EKG Interpretation    Date/Time:    Ventricular Rate:    PR Interval:    QRS Duration:   QT Interval:    QTC Calculation:   R Axis:     Text Interpretation:  MDM   1. Cardiac arrhythmia   sent for cardiac eval prior to shoulder surg in am.    Billy Fischer, MD 05/30/13 1754

## 2013-05-30 NOTE — ED Notes (Signed)
Pt ambulated to restroom without difficulty.  Family at bedside.

## 2013-05-30 NOTE — ED Notes (Signed)
Patient presents to ED via EMS from [facility] for complaints of palpitations. Patient due for hip replacement in the AM. Patient went to [facility] and was found to be in 2nd degree heart block wenckebach.

## 2013-05-30 NOTE — Telephone Encounter (Signed)
Mr. Ridener called c/o palpitations this evening. History reviewed. Longstanding h/o PVCs. Previously on propranolol prescribed by Dr. Coralie Keens, tolerated well. Switched to short-acting diltiazem, but then transitioned to amlodipine due to episode of Wenkebach on event monitoring last year. He has hip surgery scheduled for tomorrow and is anxious about that. No EtOH, caffeine use. Getting plenty of rest and hydrating. Discussed rate vs rhythm control options with Dr. Rayann Heman given Magnus Sinning-- this may be a transient episode during sleep, and vagal mediated (occured at 0430). Will start propranolol PRN for palpitations as this was well-tolerated in the past. Suspect anxiety contributing. May have higher frequency of palpitations post-op. Take propranolol PRN. He understood and agreed.     Jacquelynn Cree, PA-C 05/30/2013 9:32 AM

## 2013-05-30 NOTE — ED Notes (Signed)
PT ambulated with baseline gait; VSS; A&Ox3; no signs of distress; respirations even and unlabored; skin warm and dry; no questions upon discharge.  

## 2013-05-31 ENCOUNTER — Inpatient Hospital Stay (HOSPITAL_COMMUNITY)
Admission: RE | Admit: 2013-05-31 | Discharge: 2013-06-02 | DRG: 470 | Disposition: A | Discharge status: Home Health | Payer: Medicare Other | Source: Ambulatory Visit | Attending: Orthopedic Surgery | Admitting: Orthopedic Surgery

## 2013-05-31 ENCOUNTER — Encounter (HOSPITAL_COMMUNITY): Payer: Medicare Other | Admitting: Anesthesiology

## 2013-05-31 ENCOUNTER — Inpatient Hospital Stay (HOSPITAL_COMMUNITY): Payer: Medicare Other | Admitting: Anesthesiology

## 2013-05-31 ENCOUNTER — Inpatient Hospital Stay (HOSPITAL_COMMUNITY): Payer: Medicare Other

## 2013-05-31 ENCOUNTER — Encounter (HOSPITAL_COMMUNITY): Payer: Self-pay | Admitting: Anesthesiology

## 2013-05-31 ENCOUNTER — Encounter (HOSPITAL_COMMUNITY)
Admission: RE | Disposition: A | Discharge status: Home Health | Payer: Self-pay | Source: Ambulatory Visit | Attending: Orthopedic Surgery

## 2013-05-31 DIAGNOSIS — M818 Other osteoporosis without current pathological fracture: Secondary | ICD-10-CM | POA: Diagnosis present

## 2013-05-31 DIAGNOSIS — Z8249 Family history of ischemic heart disease and other diseases of the circulatory system: Secondary | ICD-10-CM

## 2013-05-31 DIAGNOSIS — Z860100 Personal history of colon polyps, unspecified: Secondary | ICD-10-CM

## 2013-05-31 DIAGNOSIS — K5730 Diverticulosis of large intestine without perforation or abscess without bleeding: Secondary | ICD-10-CM | POA: Diagnosis present

## 2013-05-31 DIAGNOSIS — Z9089 Acquired absence of other organs: Secondary | ICD-10-CM

## 2013-05-31 DIAGNOSIS — I10 Essential (primary) hypertension: Secondary | ICD-10-CM | POA: Diagnosis present

## 2013-05-31 DIAGNOSIS — I491 Atrial premature depolarization: Secondary | ICD-10-CM | POA: Diagnosis present

## 2013-05-31 DIAGNOSIS — I4949 Other premature depolarization: Secondary | ICD-10-CM | POA: Diagnosis not present

## 2013-05-31 DIAGNOSIS — M169 Osteoarthritis of hip, unspecified: Principal | ICD-10-CM | POA: Diagnosis present

## 2013-05-31 DIAGNOSIS — I6529 Occlusion and stenosis of unspecified carotid artery: Secondary | ICD-10-CM | POA: Diagnosis present

## 2013-05-31 DIAGNOSIS — E785 Hyperlipidemia, unspecified: Secondary | ICD-10-CM | POA: Diagnosis present

## 2013-05-31 DIAGNOSIS — Z7982 Long term (current) use of aspirin: Secondary | ICD-10-CM

## 2013-05-31 DIAGNOSIS — I7300 Raynaud's syndrome without gangrene: Secondary | ICD-10-CM | POA: Diagnosis present

## 2013-05-31 DIAGNOSIS — M1610 Unilateral primary osteoarthritis, unspecified hip: Principal | ICD-10-CM | POA: Diagnosis present

## 2013-05-31 DIAGNOSIS — K219 Gastro-esophageal reflux disease without esophagitis: Secondary | ICD-10-CM | POA: Diagnosis present

## 2013-05-31 DIAGNOSIS — F3289 Other specified depressive episodes: Secondary | ICD-10-CM | POA: Diagnosis present

## 2013-05-31 DIAGNOSIS — F329 Major depressive disorder, single episode, unspecified: Secondary | ICD-10-CM | POA: Diagnosis present

## 2013-05-31 DIAGNOSIS — I658 Occlusion and stenosis of other precerebral arteries: Secondary | ICD-10-CM | POA: Diagnosis present

## 2013-05-31 DIAGNOSIS — E538 Deficiency of other specified B group vitamins: Secondary | ICD-10-CM | POA: Diagnosis present

## 2013-05-31 DIAGNOSIS — D693 Immune thrombocytopenic purpura: Secondary | ICD-10-CM | POA: Diagnosis present

## 2013-05-31 DIAGNOSIS — Z79899 Other long term (current) drug therapy: Secondary | ICD-10-CM

## 2013-05-31 DIAGNOSIS — Z881 Allergy status to other antibiotic agents status: Secondary | ICD-10-CM

## 2013-05-31 DIAGNOSIS — Z83719 Family history of colon polyps, unspecified: Secondary | ICD-10-CM

## 2013-05-31 DIAGNOSIS — F411 Generalized anxiety disorder: Secondary | ICD-10-CM | POA: Diagnosis present

## 2013-05-31 DIAGNOSIS — Z8371 Family history of colonic polyps: Secondary | ICD-10-CM

## 2013-05-31 HISTORY — PX: TOTAL HIP ARTHROPLASTY: SHX124

## 2013-05-31 SURGERY — ARTHROPLASTY, HIP, TOTAL,POSTERIOR APPROACH
Anesthesia: General | Site: Hip | Laterality: Left

## 2013-05-31 MED ORDER — OXYCODONE HCL 5 MG PO TABS
ORAL_TABLET | ORAL | Status: AC
  Administered 2013-05-31: 14:00:00
  Filled 2013-05-31: qty 1

## 2013-05-31 MED ORDER — PHENOL 1.4 % MT LIQD: 1.0000 | OROMUCOSAL | Status: DC | PRN

## 2013-05-31 MED ORDER — LORATADINE 10 MG PO TABS
10.0000 mg | ORAL_TABLET | Freq: Every day | ORAL | Status: DC
  Administered 2013-06-01 – 2013-06-02 (×2): 10 mg via ORAL
  Filled 2013-05-31 (×2): qty 1

## 2013-05-31 MED ORDER — DIPHENHYDRAMINE HCL 12.5 MG/5ML PO ELIX: 12.5000 mg | ORAL_SOLUTION | ORAL | Status: DC | PRN

## 2013-05-31 MED ORDER — METHOCARBAMOL 500 MG PO TABS
ORAL_TABLET | ORAL | Status: AC
  Administered 2013-05-31: 14:00:00
  Filled 2013-05-31: qty 1

## 2013-05-31 MED ORDER — HYDROMORPHONE HCL PF 1 MG/ML IJ SOLN
0.5000 mg | INTRAMUSCULAR | Status: AC | PRN
  Administered 2013-05-31 (×4): 0.5 mg via INTRAVENOUS

## 2013-05-31 MED ORDER — OXYCODONE HCL 5 MG PO TABS: 5.0000 mg | ORAL_TABLET | Freq: Once | ORAL | Status: DC

## 2013-05-31 MED ORDER — KCL IN DEXTROSE-NACL 20-5-0.45 MEQ/L-%-% IV SOLN
INTRAVENOUS | Status: DC
  Administered 2013-05-31 – 2013-06-01 (×3): via INTRAVENOUS
  Filled 2013-05-31 (×10): qty 1000

## 2013-05-31 MED ORDER — METHOCARBAMOL 100 MG/ML IJ SOLN
500.0000 mg | Freq: Four times a day (QID) | INTRAVENOUS | Status: DC | PRN
  Filled 2013-05-31: qty 5

## 2013-05-31 MED ORDER — PHENYLEPHRINE HCL 10 MG/ML IJ SOLN
INTRAMUSCULAR | Status: DC | PRN
  Administered 2013-05-31 (×4): 80 ug via INTRAVENOUS

## 2013-05-31 MED ORDER — PROPOFOL 10 MG/ML IV BOLUS
INTRAVENOUS | Status: DC | PRN
  Administered 2013-05-31: 120 mg via INTRAVENOUS

## 2013-05-31 MED ORDER — 0.9 % SODIUM CHLORIDE (POUR BTL) OPTIME
TOPICAL | Status: DC | PRN
  Administered 2013-05-31: 1000 mL

## 2013-05-31 MED ORDER — LIDOCAINE HCL (CARDIAC) 20 MG/ML IV SOLN
INTRAVENOUS | Status: DC | PRN
  Administered 2013-05-31: 100 mg via INTRAVENOUS

## 2013-05-31 MED ORDER — HYDROMORPHONE HCL PF 1 MG/ML IJ SOLN
1.0000 mg | INTRAMUSCULAR | Status: DC | PRN
  Administered 2013-05-31 – 2013-06-01 (×5): 1 mg via INTRAVENOUS
  Filled 2013-05-31 (×5): qty 1

## 2013-05-31 MED ORDER — PANTOPRAZOLE SODIUM 40 MG PO TBEC
40.0000 mg | DELAYED_RELEASE_TABLET | Freq: Every day | ORAL | Status: DC
  Administered 2013-06-01 – 2013-06-02 (×2): 40 mg via ORAL
  Filled 2013-05-31 (×2): qty 1

## 2013-05-31 MED ORDER — METOCLOPRAMIDE HCL 5 MG/ML IJ SOLN: 5.0000 mg | Freq: Three times a day (TID) | INTRAMUSCULAR | Status: DC | PRN

## 2013-05-31 MED ORDER — FENTANYL CITRATE 0.05 MG/ML IJ SOLN
INTRAMUSCULAR | Status: AC
  Filled 2013-05-31: qty 2

## 2013-05-31 MED ORDER — PROPRANOLOL HCL 20 MG PO TABS
20.0000 mg | ORAL_TABLET | Freq: Three times a day (TID) | ORAL | Status: DC | PRN
  Filled 2013-05-31: qty 1

## 2013-05-31 MED ORDER — HYDROMORPHONE HCL PF 1 MG/ML IJ SOLN
INTRAMUSCULAR | Status: AC
  Administered 2013-05-31: 14:00:00
  Filled 2013-05-31: qty 1

## 2013-05-31 MED ORDER — METHOCARBAMOL 500 MG PO TABS
500.0000 mg | ORAL_TABLET | Freq: Four times a day (QID) | ORAL | Status: DC | PRN
  Administered 2013-05-31 – 2013-06-02 (×7): 500 mg via ORAL
  Filled 2013-05-31 (×7): qty 1

## 2013-05-31 MED ORDER — METOCLOPRAMIDE HCL 10 MG PO TABS
5.0000 mg | ORAL_TABLET | Freq: Three times a day (TID) | ORAL | Status: DC | PRN
  Administered 2013-06-01: 10 mg via ORAL
  Filled 2013-05-31: qty 1

## 2013-05-31 MED ORDER — SENNOSIDES-DOCUSATE SODIUM 8.6-50 MG PO TABS: 1.0000 | ORAL_TABLET | Freq: Every evening | ORAL | Status: DC | PRN

## 2013-05-31 MED ORDER — DOCUSATE SODIUM 100 MG PO CAPS
100.0000 mg | ORAL_CAPSULE | Freq: Two times a day (BID) | ORAL | Status: DC
  Administered 2013-05-31 – 2013-06-02 (×4): 100 mg via ORAL
  Filled 2013-05-31 (×5): qty 1

## 2013-05-31 MED ORDER — ASPIRIN EC 325 MG PO TBEC
325.0000 mg | DELAYED_RELEASE_TABLET | Freq: Two times a day (BID) | ORAL | Status: DC
Start: 2013-05-31 — End: 2013-12-30

## 2013-05-31 MED ORDER — ACETAMINOPHEN 325 MG PO TABS: 650.0000 mg | ORAL_TABLET | Freq: Four times a day (QID) | ORAL | Status: DC | PRN

## 2013-05-31 MED ORDER — ASPIRIN EC 325 MG PO TBEC
325.0000 mg | DELAYED_RELEASE_TABLET | Freq: Every day | ORAL | Status: DC
  Administered 2013-06-01 – 2013-06-02 (×2): 325 mg via ORAL
  Filled 2013-05-31 (×3): qty 1

## 2013-05-31 MED ORDER — DIAZEPAM 5 MG PO TABS
ORAL_TABLET | ORAL | Status: AC
  Administered 2013-05-31: 14:00:00
  Filled 2013-05-31: qty 1

## 2013-05-31 MED ORDER — DIAZEPAM 5 MG PO TABS
2.5000 mg | ORAL_TABLET | Freq: Every evening | ORAL | Status: DC | PRN
  Administered 2013-05-31: 5 mg via ORAL
  Filled 2013-05-31: qty 1

## 2013-05-31 MED ORDER — MAGNESIUM CITRATE PO SOLN: 1.0000 | Freq: Once | ORAL | Status: AC | PRN

## 2013-05-31 MED ORDER — ROCURONIUM BROMIDE 100 MG/10ML IV SOLN
INTRAVENOUS | Status: DC | PRN
Start: 2013-05-31 — End: 2013-05-31
  Administered 2013-05-31: 50 mg via INTRAVENOUS

## 2013-05-31 MED ORDER — OXYCODONE HCL 5 MG PO TABS
5.0000 mg | ORAL_TABLET | ORAL | Status: DC | PRN
  Administered 2013-05-31 (×2): 10 mg via ORAL
  Administered 2013-05-31: 5 mg via ORAL
  Administered 2013-06-01 – 2013-06-02 (×7): 10 mg via ORAL
  Administered 2013-06-02: 5 mg via ORAL
  Filled 2013-05-31: qty 1
  Filled 2013-05-31 (×9): qty 2

## 2013-05-31 MED ORDER — MIDAZOLAM HCL 5 MG/5ML IJ SOLN
INTRAMUSCULAR | Status: DC | PRN
  Administered 2013-05-31 (×2): 1 mg via INTRAVENOUS

## 2013-05-31 MED ORDER — ONDANSETRON HCL 4 MG/2ML IJ SOLN
4.0000 mg | Freq: Four times a day (QID) | INTRAMUSCULAR | Status: DC | PRN
  Administered 2013-06-01: 4 mg via INTRAVENOUS
  Filled 2013-05-31: qty 2

## 2013-05-31 MED ORDER — BUPIVACAINE-EPINEPHRINE (PF) 0.5% -1:200000 IJ SOLN
INTRAMUSCULAR | Status: AC
  Filled 2013-05-31: qty 10

## 2013-05-31 MED ORDER — FENTANYL CITRATE 0.05 MG/ML IJ SOLN
25.0000 ug | INTRAMUSCULAR | Status: DC | PRN
  Administered 2013-05-31 (×3): 50 ug via INTRAVENOUS

## 2013-05-31 MED ORDER — MENTHOL 3 MG MT LOZG: 1.0000 | LOZENGE | OROMUCOSAL | Status: DC | PRN

## 2013-05-31 MED ORDER — LABETALOL HCL 5 MG/ML IV SOLN
INTRAVENOUS | Status: AC
  Administered 2013-05-31: 15:00:00
  Filled 2013-05-31: qty 4

## 2013-05-31 MED ORDER — OXYCODONE-ACETAMINOPHEN 5-325 MG PO TABS: 1.0000 | ORAL_TABLET | ORAL | Status: DC | PRN

## 2013-05-31 MED ORDER — AMLODIPINE BESYLATE 2.5 MG PO TABS
2.5000 mg | ORAL_TABLET | Freq: Every day | ORAL | Status: DC
  Administered 2013-05-31: 2.5 mg via ORAL
  Filled 2013-05-31 (×3): qty 1

## 2013-05-31 MED ORDER — NEOSTIGMINE METHYLSULFATE 1 MG/ML IJ SOLN
INTRAMUSCULAR | Status: DC | PRN
  Administered 2013-05-31: 4 mg via INTRAVENOUS

## 2013-05-31 MED ORDER — LACTATED RINGERS IV SOLN
INTRAVENOUS | Status: DC | PRN
  Administered 2013-05-31 (×2): via INTRAVENOUS

## 2013-05-31 MED ORDER — LABETALOL HCL 5 MG/ML IV SOLN
5.0000 mg | INTRAVENOUS | Status: AC | PRN
  Administered 2013-05-31 (×2): 5 mg via INTRAVENOUS

## 2013-05-31 MED ORDER — FENTANYL CITRATE 0.05 MG/ML IJ SOLN
INTRAMUSCULAR | Status: DC | PRN
  Administered 2013-05-31: 50 ug via INTRAVENOUS
  Administered 2013-05-31 (×2): 100 ug via INTRAVENOUS

## 2013-05-31 MED ORDER — METHOCARBAMOL 500 MG PO TABS: 500.0000 mg | ORAL_TABLET | Freq: Two times a day (BID) | ORAL | Status: DC

## 2013-05-31 MED ORDER — ONDANSETRON HCL 4 MG PO TABS: 4.0000 mg | ORAL_TABLET | Freq: Four times a day (QID) | ORAL | Status: DC | PRN

## 2013-05-31 MED ORDER — BUPIVACAINE-EPINEPHRINE 0.5% -1:200000 IJ SOLN
INTRAMUSCULAR | Status: DC | PRN
  Administered 2013-05-31: 30 mL

## 2013-05-31 MED ORDER — HYDROMORPHONE HCL PF 1 MG/ML IJ SOLN
INTRAMUSCULAR | Status: AC
  Filled 2013-05-31: qty 1

## 2013-05-31 MED ORDER — BISACODYL 5 MG PO TBEC: 5.0000 mg | DELAYED_RELEASE_TABLET | Freq: Every day | ORAL | Status: DC | PRN

## 2013-05-31 MED ORDER — ONDANSETRON HCL 4 MG/2ML IJ SOLN
INTRAMUSCULAR | Status: DC | PRN
Start: 2013-05-31 — End: 2013-05-31
  Administered 2013-05-31: 4 mg via INTRAVENOUS

## 2013-05-31 MED ORDER — LACTATED RINGERS IV SOLN
INTRAVENOUS | Status: DC
  Administered 2013-05-31: 09:00:00 via INTRAVENOUS

## 2013-05-31 MED ORDER — GLYCOPYRROLATE 0.2 MG/ML IJ SOLN
INTRAMUSCULAR | Status: DC | PRN
  Administered 2013-05-31: 0.6 mg via INTRAVENOUS
  Administered 2013-05-31: 0.2 mg via INTRAVENOUS

## 2013-05-31 MED ORDER — ACETAMINOPHEN 650 MG RE SUPP: 650.0000 mg | Freq: Four times a day (QID) | RECTAL | Status: DC | PRN

## 2013-05-31 MED ORDER — ARTIFICIAL TEARS OP OINT
TOPICAL_OINTMENT | OPHTHALMIC | Status: DC | PRN
  Administered 2013-05-31: 1 via OPHTHALMIC

## 2013-05-31 SURGICAL SUPPLY — 53 items
BLADE SAW SGTL 18X1.27X75 (BLADE) ×2 IMPLANT
BLADE SAW SGTL 18X1.27X75MM (BLADE) ×1
BRUSH FEMORAL CANAL (MISCELLANEOUS) IMPLANT
CAPT HIP PF COP ×2 IMPLANT
CLOTH BEACON ORANGE TIMEOUT ST (SAFETY) ×3 IMPLANT
COVER BACK TABLE 24X17X13 BIG (DRAPES) IMPLANT
COVER SURGICAL LIGHT HANDLE (MISCELLANEOUS) ×6 IMPLANT
DRAPE ORTHO SPLIT 77X108 STRL (DRAPES) ×3
DRAPE PROXIMA HALF (DRAPES) ×3 IMPLANT
DRAPE SURG ORHT 6 SPLT 77X108 (DRAPES) ×1 IMPLANT
DRAPE U-SHAPE 47X51 STRL (DRAPES) ×3 IMPLANT
DRILL BIT 7/64X5 (BIT) ×3 IMPLANT
DRSG AQUACEL AG ADV 3.5X10 (GAUZE/BANDAGES/DRESSINGS) ×3 IMPLANT
DRSG MEPILEX BORDER 4X12 (GAUZE/BANDAGES/DRESSINGS) ×2 IMPLANT
DURAPREP 26ML APPLICATOR (WOUND CARE) ×3 IMPLANT
ELECT BLADE 4.0 EZ CLEAN MEGAD (MISCELLANEOUS)
ELECT REM PT RETURN 9FT ADLT (ELECTROSURGICAL) ×3
ELECTRODE BLDE 4.0 EZ CLN MEGD (MISCELLANEOUS) IMPLANT
ELECTRODE REM PT RTRN 9FT ADLT (ELECTROSURGICAL) ×1 IMPLANT
GAUZE XEROFORM 1X8 LF (GAUZE/BANDAGES/DRESSINGS) ×3 IMPLANT
GLOVE BIO SURGEON STRL SZ7.5 (GLOVE) ×3 IMPLANT
GLOVE BIO SURGEON STRL SZ8.5 (GLOVE) ×6 IMPLANT
GLOVE BIOGEL PI IND STRL 8 (GLOVE) ×2 IMPLANT
GLOVE BIOGEL PI IND STRL 9 (GLOVE) ×1 IMPLANT
GLOVE BIOGEL PI INDICATOR 8 (GLOVE) ×4
GLOVE BIOGEL PI INDICATOR 9 (GLOVE) ×2
GOWN PREVENTION PLUS XLARGE (GOWN DISPOSABLE) ×3 IMPLANT
GOWN STRL NON-REIN LRG LVL3 (GOWN DISPOSABLE) ×6 IMPLANT
GOWN STRL REIN XL XLG (GOWN DISPOSABLE) ×6 IMPLANT
HANDPIECE INTERPULSE COAX TIP (DISPOSABLE)
HOOD PEEL AWAY FACE SHEILD DIS (HOOD) ×6 IMPLANT
KIT BASIN OR (CUSTOM PROCEDURE TRAY) ×3 IMPLANT
KIT ROOM TURNOVER OR (KITS) ×3 IMPLANT
MANIFOLD NEPTUNE II (INSTRUMENTS) ×3 IMPLANT
NEEDLE 22X1 1/2 (OR ONLY) (NEEDLE) ×3 IMPLANT
NS IRRIG 1000ML POUR BTL (IV SOLUTION) ×3 IMPLANT
PACK TOTAL JOINT (CUSTOM PROCEDURE TRAY) ×3 IMPLANT
PAD ARMBOARD 7.5X6 YLW CONV (MISCELLANEOUS) ×6 IMPLANT
PASSER SUT SWANSON 36MM LOOP (INSTRUMENTS) ×3 IMPLANT
PRESSURIZER FEMORAL UNIV (MISCELLANEOUS) IMPLANT
SET HNDPC FAN SPRY TIP SCT (DISPOSABLE) IMPLANT
SUT ETHIBOND 2 V 37 (SUTURE) ×3 IMPLANT
SUT ETHILON 3 0 FSL (SUTURE) ×3 IMPLANT
SUT VIC AB 0 CTB1 27 (SUTURE) ×3 IMPLANT
SUT VIC AB 1 CTX 36 (SUTURE) ×3
SUT VIC AB 1 CTX36XBRD ANBCTR (SUTURE) ×1 IMPLANT
SUT VIC AB 2-0 CTB1 (SUTURE) ×3 IMPLANT
SYR CONTROL 10ML LL (SYRINGE) ×3 IMPLANT
TOWEL OR 17X24 6PK STRL BLUE (TOWEL DISPOSABLE) ×3 IMPLANT
TOWEL OR 17X26 10 PK STRL BLUE (TOWEL DISPOSABLE) ×3 IMPLANT
TOWER CARTRIDGE SMART MIX (DISPOSABLE) IMPLANT
TRAY FOLEY CATH 14FR (SET/KITS/TRAYS/PACK) IMPLANT
WATER STERILE IRR 1000ML POUR (IV SOLUTION) ×12 IMPLANT

## 2013-05-31 NOTE — Interval H&P Note (Signed)
History and Physical Interval Note:  05/31/2013 10:39 AM  Corey Fischer  has presented today for surgery, with the diagnosis of Left Hip Osteoarthritis  The various methods of treatment have been discussed with the patient and family. After consideration of risks, benefits and other options for treatment, the patient has consented to  Procedure(s): TOTAL HIP ARTHROPLASTY (Left) as a surgical intervention .  The patient's history has been reviewed, patient examined, no change in status, stable for surgery.  I have reviewed the patient's chart and labs.  Questions were answered to the patient's satisfaction.     Kerin Salen

## 2013-05-31 NOTE — Anesthesia Procedure Notes (Signed)
Procedure Name: Intubation Date/Time: 05/31/2013 11:09 AM Performed by: Scheryl Darter Pre-anesthesia Checklist: Patient identified, Suction available and Patient being monitored Patient Re-evaluated:Patient Re-evaluated prior to inductionOxygen Delivery Method: Circle system utilized Preoxygenation: Pre-oxygenation with 100% oxygen Intubation Type: IV induction Ventilation: Mask ventilation without difficulty Laryngoscope Size: Miller and 3 Grade View: Grade II Tube type: Oral Tube size: 7.5 mm Number of attempts: 1 Airway Equipment and Method: Stylet Placement Confirmation: ETT inserted through vocal cords under direct vision,  positive ETCO2 and breath sounds checked- equal and bilateral Secured at: 23 cm Tube secured with: Tape Dental Injury: Teeth and Oropharynx as per pre-operative assessment

## 2013-05-31 NOTE — ED Provider Notes (Signed)
CSN: [id number]     Arrival date & time 05/30/13  1811 History   First MD Initiated Contact with Patient 05/30/13 1812     No chief complaint on file.  (Consider location/radiation/quality/duration/timing/severity/associated sxs/prior Treatment) Patient is a 80 y.o. male presenting with palpitations.  Palpitations Palpitations quality:  Irregular Onset quality:  Gradual Duration:  2 days Timing:  Constant Progression:  Waxing and waning Chronicity:  New Context: anxiety (has surgery tomorrow planned for hip replacement)   Relieved by:  Nothing Ineffective treatments:  Beta blockers Associated symptoms: no back pain, no chest pain, no cough, no diaphoresis, no dizziness, no nausea, no near-syncope, no numbness, no shortness of breath, no vomiting and no weakness   Risk factors comment:  Hx of prior arrythmais   Past Medical History  Diagnosis Date  . Chronic ITP (idiopathic thrombocytopenia)   . Hx of adenomatous colonic polyps 2010  . Diverticulosis   . GERD (gastroesophageal reflux disease)   . Allergy   . Hyperlipidemia   . Hypertension   . Elevated PSA   . Anxiety   . Depression   . Stricture and stenosis of esophagus 2010    last endoscopy- 2010  . Gastritis   . Hemorrhoids   . History of hiatal hernia   . Raynaud's syndrome   . Gastric polyp   . Asymptomatic bilateral carotid artery stenosis per duplex 05-26-2007--  bil. ica 40-59%  . Constipation   . Head cold   . Arthritis   . Osteoarthritis hips  . BPH (benign prostatic hyperplasia)   . Nocturia   . B12 deficiency   . Palpitations     monitor 05/2012- NSR with symptomatic PVC; echo 06-17-12- EF 55-60%aoric sclerosis without stenosis,   . Dysrhythmia     PVC's  . History of stress test 11/2012    pt. cleared for surgery based on good stress test result    . H/O hiatal hernia    Past Surgical History  Procedure Laterality Date  . Rotator cuff repair  1988    RIGHT  . Tonsillectomy  age 36  .  Appendectomy  age 46  . Laparoscopic inguinal hernia repair  09-19-2010    BILATERAL  . Left shoulder surg.  2007  . Cholecystectomy  1989  . Carotid duplex  05-26-2007    BILATERAL ICA  40-59%  . Transurethral resection of prostate  07/22/2011    Procedure: TRANSURETHRAL RESECTION OF THE PROSTATE WITH GYRUS INSTRUMENTS;  Surgeon: Bernestine Amass, MD;  Location: [facility];  Service: Urology;  Laterality: N/A;  Saline Gyrus TURP  1 hour requested for this procedure  GYRUS  . Cardiac catheterization  1989    Told that he is "pristine "   Family History  Problem Relation Age of Onset  . Heart disease Mother   . Heart disease Father   . Colon cancer Neg Hx   . Colon polyps Mother   . Cancer Maternal Grandmother   . Heart failure Maternal Grandfather    History  Substance Use Topics  . Smoking status: Never Smoker   . Smokeless tobacco: Never Used  . Alcohol Use: Yes     Comment: occasionally    Review of Systems  Constitutional: Negative for fever, chills and diaphoresis.  HENT: Negative for sore throat.   Eyes: Negative for pain.  Respiratory: Negative for cough and shortness of breath.   Cardiovascular: Positive for palpitations. Negative for chest pain and near-syncope.  Gastrointestinal: Negative for nausea,  vomiting and abdominal pain.  Genitourinary: Negative for dysuria and flank pain.  Musculoskeletal: Negative for back pain and neck pain.  Skin: Negative for rash.  Neurological: Negative for dizziness, seizures, numbness and headaches.    Allergies  Ciprofloxacin  Home Medications   Current Outpatient Rx  Name  Route  Sig  Dispense  Refill  . amLODipine (NORVASC) 2.5 MG tablet   Oral   Take 2.5 mg by mouth at bedtime.          . Ascorbic Acid (VITAMIN C) 1000 MG tablet   Oral   Take 1,000 mg by mouth daily.          [name] aspirin 81 MG tablet   Oral   Take 81 mg by mouth daily.         . Calcium 500 MG CHEW   Oral   Chew 500 mg  by mouth daily.         . cetirizine (ZYRTEC) 10 MG tablet   Oral   Take 10 mg by mouth daily with breakfast.          . Cholecalciferol (VITAMIN D3) 1000 UNITS CAPS   Oral   Take 2,000 Units by mouth daily.          . Coenzyme Q10 50 MG CAPS   Oral   Take 50 mg by mouth daily.          . Cyanocobalamin 2500 MCG SUBL   Sublingual   Place 2,500 mcg under the tongue daily.          . diazepam (VALIUM) 5 MG tablet   Oral   Take 2.5-5 mg by mouth at bedtime as needed for anxiety or sedation.         . Magnesium 200 MG TABS   Oral   Take 200 mg by mouth 2 (two) times daily.         . polyethylene glycol (MIRALAX / GLYCOLAX) packet   Oral   Take 17 g by mouth daily.          . propranolol (INDERAL) 20 MG tablet   Oral   Take 20 mg by mouth 3 (three) times daily as needed (for palpitations).         . RABEprazole (ACIPHEX) 20 MG tablet   Oral   Take 20 mg by mouth at bedtime.          . simvastatin (ZOCOR) 20 MG tablet   Oral   Take 20 mg by mouth daily at 6 PM.          . Zinc 25 MG TABS   Oral   Take 25 mg by mouth daily.           BP 105/68  Pulse 55  Temp(Src) 98.7 F (37.1 C) (Oral)  Resp 18  Ht 5\' 8"  (1.727 m)  Wt 154 lb (69.854 kg)  BMI 23.42 kg/m2  SpO2 96% Physical Exam  Constitutional: He is oriented to person, place, and time. He appears well-developed and well-nourished. No distress.  HENT:  Head: Normocephalic and atraumatic.  Eyes: Pupils are equal, round, and reactive to light.  Neck: Normal range of motion.  Cardiovascular: S1 normal and S2 normal.  A regularly irregular rhythm present. Bradycardia present.   Pulmonary/Chest: Effort normal and breath sounds normal.  Abdominal: Soft. He exhibits no distension. There is no tenderness.  Musculoskeletal: Normal range of motion.  Neurological: He is alert and oriented to person, place, and time.  Skin: Skin is warm. He is not diaphoretic.    ED Course  Procedures  (including critical care time) Labs Review Labs Reviewed  CBC - Abnormal; Notable for the following:    Platelets 145 (*)    All other components within normal limits  COMPREHENSIVE METABOLIC PANEL - Abnormal; Notable for the following:    GFR calc non Af Amer 63 (*)    GFR calc Af Amer 73 (*)    All other components within normal limits   Imaging Review No results found.  EKG Interpretation   None       MDM   1. Palpitations    80 yo M with hx of prior palpitations, hx of 2nd Degree Type 1 block, hx of PVCs, PACs, who presents with feelings of palpitations in his central chest since yesterday.   Was seen and evaluated at [facility], sent here for heart block. Patient in 1st degree heart block, with bigeminy. Repeat EKG performed here confirms prolonged PR interval without prolonging, with bigeminy. No complete heart block. No dropped beats.   Given EKG and symptoms, will consult cardiology. Spoke with cardiologist on-call for Dr. Gwenlyn Found. Discussed case, and patient's hemodynamic stability and regularly irregular rhythm. Reviewed EKGs. Discussed potential impact on OR case tomorrow. Given HDS and known history of arrhythmias, no need to call off surgery tomorrow. Cardiologist feels comfortable still with Dr. Kennon Holter clearance for surgery. Discussed these findings / results with the patient, who feels comfortable with the plan and with discharge with follow-up with cardiology s/p surgery. Patient feels comfortable with this plan, as does the wife who is present for all the discussions. Patient discharged to home in stable condition. Patient may continue to take beta blockers as prescribed if needed, but if he has not felt improvement from these medications, then he may hold until evaluation by his cardiologist. Discharged to home ins table codnition. Patient seen and evaluated by myself and my attending, Dr. Ashok Cordia.     Freddi Che, MD 05/31/13 [phone]

## 2013-05-31 NOTE — Op Note (Signed)
OPERATIVE REPORT    DATE OF PROCEDURE:  05/31/2013       PREOPERATIVE DIAGNOSIS:  Left Hip Osteoarthritis                                                          POSTOPERATIVE DIAGNOSIS:  Left Hip Osteoarthritis                                                           PROCEDURE:  L total hip arthroplasty using a 54 mm DePuy Pinnacle  Cup, [employer], 10-degree polyethylene liner index superior  and posterior, a +0 36 mm ceramic head, a [phone] SROM stem, 20FL Sleeve   SURGEON: Silvanna Ohmer J    ASSISTANT:   [employer]  (present throughout entire procedure and necessary for timely completion of the procedure)   ANESTHESIA: General BLOOD LOSS: 300 FLUID REPLACEMENT: 1500 crystalloid DRAINS: Foley Catheter URINE OUTPUT: [id number] COMPLICATIONS: none    INDICATIONS FOR PROCEDURE: A 80 y.o. year-old With  Left Hip Osteoarthritis   for 3 years, x-rays show bone-on-bone arthritic changes. Despite conservative measures with observation, anti-inflammatory medicine, narcotics, use of a cane, has severe unremitting pain and can ambulate only a few blocks before resting.  Patient desires elective L total hip arthroplasty to decrease pain and increase function. The risks, benefits, and alternatives were discussed at length including but not limited to the risks of infection, bleeding, nerve injury, stiffness, blood clots, the need for revision surgery, cardiopulmonary complications, among others, and they were willing to proceed. Questions answered     PROCEDURE IN DETAIL: The patient was identified by armband,  received preoperative IV antibiotics in the holding area at [facility], taken to the operating room , appropriate anesthetic monitors  were attached and general endotracheal anesthesia induced. Foley catheter was inserted. Pt was rolled into the R lateral decubitus position and fixed there with a Stulberg Mark II pelvic clamp.  The L lower extremity was then  prepped and draped  in the usual sterile fashion from the ankle to the hemipelvis. A time-out  procedure was performed. The skin along the lateral hip and thigh  infiltrated with 10 mL of 0.5% Marcaine and epinephrine solution. We  then made a posterolateral approach to the hip. With a #10 blade, a 12 cm  incision was made through the skin and subcutaneous tissue down to the level of the  IT band. Small bleeders were identified and cauterized. The IT band was cut in  line with skin incision exposing the greater trochanter. A Cobra retractor was placed between the gluteus minimus and the superior hip joint capsule, and a spiked Cobra between the quadratus femoris and the inferior hip joint capsule. This isolated the short  external rotators and piriformis tendons. These were tagged with a #2 Ethibond  suture and cut off their insertion on the intertrochanteric crest. The posterior  capsule was then developed into an acetabular-based flap from Posterior Superior off of the acetabulum out over the femoral neck and back posterior inferior to the acetabular rim. This flap was tagged with two #2 Ethibond  sutures and retracted protecting the sciatic nerve. This exposed the arthritic femoral head and osteophytes. The hip was then flexed and internally rotated, dislocating the femoral head and a standard neck cut performed 1 fingerbreadth above the lesser trochanter.  A spiked Cobra was placed in the cotyloid notch and a Hohmann retractor was then used to lever the femur anteriorly off of the anterior pelvic column. A posterior-inferior wing retractor was placed at the junction of the acetabulum and the ischium completing the acetabular exposure.We then removed the peripheral osteophytes and labrum from the acetabulum. We then reamed the acetabulum up to 53 mm with basket reamers obtaining good coverage in all quadrants. We then irrigated with normal  saline solution and hammered into place a 54 mm pinnacle cup  in 45  degrees of abduction and about 20 degrees of anteversion. More  peripheral osteophytes removed and a trial 10-degree liner placed with the  index superior-posterior. The hip was then flexed and internally rotated exposing the  proximal femur, which was entered with the initiating reamer followed by  the axial reamers up to a 15.5 mm full depth and 53mm partial depth. We then conically reamed to 24F to the correct depth for a 42 base neck. The calcar was milled to 24FL. A trial cone and stem was inserted in the 25 degrees anteversion, with a +0 28mm trial head. Trial reduction was then performed and excellent stability was noted with at 90 of flexion with 80 of internal rotation and then full extension with maximal external rotation. The hip could not be dislocated in full extension. The knee could easily flex  to about 130 degrees. We also stretched the abductors at this point,  because of the preexisting adductor contractures. All trial components  were then removed. The acetabulum was irrigated out with normal saline  solution. A titanium [facility] was then screwed into place  followed by a 10-degree polyethylene liner index superior-posterior. On  the femoral side a 24FL ZTT1 sleeve was hammered into place, followed by a [id number] SROM stem in 25 degrees of anteversion. At this point, a +0 36 mm ceramic head was  hammered on the stem. The hip was reduced. We checked our stability  one more time and found it to be excellent. The wound was once again  thoroughly irrigated out with normal saline solution pulse lavage. The  capsular flap and short external rotators were repaired back to the  intertrochanteric crest through drill holes with a #2 Ethibond suture.  The IT band was closed with running 1 Vicryl suture. The subcutaneous  tissue with 0 and 2-0 undyed Vicryl suture and the skin with running  interlocking 3-0 nylon suture. Dressing of Xeroform and Mepilex was  then  applied. The patient was then unclamped, rolled supine, awaken extubated and taken to recovery room without difficulty in stable condition.   Frederik Pear J 05/31/2013, 12:07 PM

## 2013-05-31 NOTE — Anesthesia Postprocedure Evaluation (Signed)
Anesthesia Post Note  Patient: Corey Fischer  Procedure(s) Performed: Procedure(s) (LRB): TOTAL HIP ARTHROPLASTY (Left)  Anesthesia type: general  Patient location: PACU  Post pain: Pain level controlled  Post assessment: Patient's Cardiovascular Status Stable  Last Vitals:  Filed Vitals:   05/31/13 1700  BP: 170/62  Pulse: 58  Temp:   Resp: 18    Post vital signs: Reviewed and stable  Level of consciousness: sedated  Complications: No apparent anesthesia complications

## 2013-05-31 NOTE — Anesthesia Preprocedure Evaluation (Addendum)
Anesthesia Evaluation  Patient identified by MRN, date of birth, ID band Patient awake    Airway Mallampati: II      Dental   Pulmonary neg pulmonary ROS,          Cardiovascular hypertension, + Peripheral Vascular Disease     Neuro/Psych Anxiety Depression negative neurological ROS     GI/Hepatic Neg liver ROS, hiatal hernia, GERD-  ,  Endo/Other    Renal/GU negative Renal ROS     Musculoskeletal   Abdominal   Peds  Hematology   Anesthesia Other Findings   Reproductive/Obstetrics                          Anesthesia Physical Anesthesia Plan  ASA: III  Anesthesia Plan: General   Post-op Pain Management:    Induction: Intravenous  Airway Management Planned: Oral ETT  Additional Equipment:   Intra-op Plan:   Post-operative Plan: Extubation in OR  Informed Consent: I have reviewed the patients History and Physical, chart, labs and discussed the procedure including the risks, benefits and alternatives for the proposed anesthesia with the patient or authorized representative who has indicated his/her understanding and acceptance.   Dental advisory given  Plan Discussed with: CRNA, Anesthesiologist and Surgeon  Anesthesia Plan Comments:        Anesthesia Quick Evaluation

## 2013-05-31 NOTE — Preoperative (Signed)
Beta Blockers   Reason not to administer Beta Blockers:Not Applicable 

## 2013-05-31 NOTE — Transfer of Care (Signed)
Immediate Anesthesia Transfer of Care Note  Patient: Corey Fischer  Procedure(s) Performed: Procedure(s): TOTAL HIP ARTHROPLASTY (Left)  Patient Location: PACU  Anesthesia Type:General  Level of Consciousness: awake  Airway & Oxygen Therapy: Patient Spontanous Breathing and Patient connected to face mask oxygen  Post-op Assessment: Report given to PACU RN and Post -op Vital signs reviewed and stable  Post vital signs: Reviewed and stable  Complications: No apparent anesthesia complications

## 2013-06-01 LAB — BASIC METABOLIC PANEL
BUN: 13 mg/dL (ref 6–23)
CALCIUM: 8.7 mg/dL (ref 8.4–10.5)
CO2: 27 mEq/L (ref 19–32)
CREATININE: 1.04 mg/dL (ref 0.50–1.35)
Chloride: 98 mEq/L (ref 96–112)
GFR calc non Af Amer: 66 mL/min — ABNORMAL LOW (ref >90)
GFR, EST AFRICAN AMERICAN: 77 mL/min — AB (ref >90)
Glucose, Bld: 136 mg/dL — ABNORMAL HIGH (ref 70–99)
Potassium: 4.1 mEq/L (ref 3.7–5.3)
Sodium: 136 mEq/L — ABNORMAL LOW (ref 137–147)

## 2013-06-01 LAB — CBC
HCT: 38.9 % — ABNORMAL LOW (ref 39.0–52.0)
Hemoglobin: 13.6 g/dL (ref 13.0–17.0)
MCH: 30.2 pg (ref 26.0–34.0)
MCHC: 35 g/dL (ref 30.0–36.0)
MCV: 86.4 fL (ref 78.0–100.0)
PLATELETS: 124 10*3/uL — AB (ref 150–400)
RBC: 4.5 MIL/uL (ref 4.22–5.81)
RDW: 13.2 % (ref 11.5–15.5)
WBC: 8.5 10*3/uL (ref 4.0–10.5)

## 2013-06-01 MED ORDER — ALUM & MAG HYDROXIDE-SIMETH 200-200-20 MG/5ML PO SUSP
30.0000 mL | ORAL | Status: DC | PRN
  Administered 2013-06-01 (×2): 30 mL via ORAL
  Filled 2013-06-01 (×2): qty 30

## 2013-06-01 NOTE — Evaluation (Signed)
Occupational Therapy Evaluation Patient Details Name: Corey Fischer MRN: [id number] DOB: 08/25/1931 Today's Date: 06/01/2013 Time: 4562-5638 OT Time Calculation (min): 22 min  OT Assessment / Plan / Recommendation History of present illness Pt is a 80 y/o male admitted s/p L THA posterior approach.    Clinical Impression   Pt demos decline in function with ADLs and ADL mobility safety and would benefit from acute OT services to address impairments to increase level of function and safety to return home. Eval limited due to pt's 7/10 L LE pain level and pt declined toilet transfers/standong at sink activities. Pt provided with education and demo for A/E use and dressing techniques    OT Assessment  Patient needs continued OT Services    Follow Up Recommendations  Home health OT;Supervision/Assistance - 24 hour    Barriers to Discharge   none  Equipment Recommendations  None recommended by OT    Recommendations for Other Services    Frequency  Min 2X/week    Precautions / Restrictions Precautions Precautions: Fall;Posterior Hip Precaution Booklet Issued: No Precaution Comments: Pt able to recall 2/3 hip precutions, reviewed all hip precautions with pt and his wife Restrictions Weight Bearing Restrictions: Yes LLE Weight Bearing: Weight bearing as tolerated   Pertinent Vitals/Pain 7/10 L LE   ADL  Grooming: Performed;Wash/dry hands;Wash/dry face;Supervision/safety;Set up Where Assessed - Grooming: Supported sitting Upper Body Bathing: Simulated;Supervision/safety;Set up Where Assessed - Upper Body Bathing: Supported sitting Lower Body Bathing: Simulated;Maximal assistance Upper Body Dressing: Performed;Supervision/safety;Set up Where Assessed - Upper Body Dressing: Supported sitting Lower Body Dressing: Maximal assistance;+1 Total assistance Transfers/Ambulation Related to ADLs: Pt declined toilet transefrs with OT due to 7/10 reports of pain, pain meds given during this  session ADL Comments: Pt and his wife provided with education and demo of ADL A/E. Pt has shower seat and reacher at home    OT Diagnosis: Acute pain  OT Problem List: Decreased knowledge of use of DME or AE;Decreased activity tolerance;Pain OT Treatment Interventions: Self-care/ADL training;Therapeutic exercise;Patient/family education;Neuromuscular education;Balance training;Therapeutic activities;DME and/or AE instruction   OT Goals(Current goals can be found in the care plan section) Acute Rehab OT Goals Patient Stated Goal: To return home OT Goal Formulation: With patient/family Time For Goal Achievement: 06/08/13 Potential to Achieve Goals: Good ADL Goals Pt Will Perform Grooming: with min assist;with min guard assist;standing Pt Will Perform Lower Body Bathing: with mod assist;with caregiver independent in assisting;with adaptive equipment Pt Will Perform Lower Body Dressing: with mod assist;with adaptive equipment;with caregiver independent in assisting Pt Will Transfer to Toilet: with min assist;ambulating;bedside commode;grab bars;regular height toilet (3 in 1 over toilet) Pt Will Perform Toileting - Clothing Manipulation and hygiene: with mod assist;with min assist;with caregiver independent in assisting;sit to/from stand Pt Will Perform Tub/Shower Transfer: with mod assist;with min assist;shower seat;grab bars  Visit Information  Last OT Received On: 06/01/13 Assistance Needed: +1 History of Present Illness: Pt is a 80 y/o male admitted s/p L THA posterior approach.        Prior [place] expects to be discharged to:: Private residence Living Arrangements: Spouse/significant other Available Help at Discharge: Family;Available 24 hours/day Type of Home: House Home Access: Stairs to enter [employer] of Steps: 4 Entrance Stairs-Rails: Left Home Layout: Two level;Able to live on main level with bedroom/bathroom Home  Equipment: Cane - single point;Grab bars - toilet;Grab bars - tub/shower;Shower seat - built in Prior Function Level of Independence: Independent Communication Communication: No difficulties Dominant  Hand: Right         Vision/Perception Vision - History Baseline Vision: Wears glasses only for reading Patient Visual Report: No change from baseline Perception Perception: Within Functional Limits   Cognition  Cognition Arousal/Alertness: Awake/alert Behavior During Therapy: WFL for tasks assessed/performed Overall Cognitive Status: Within Functional Limits for tasks assessed    Extremity/Trunk Assessment Upper Extremity Assessment Upper Extremity Assessment: Overall WFL for tasks assessed Lower Extremity Assessment Lower Extremity Assessment: Defer to PT evaluation LLE Deficits / Details: Decreased strength and AROM consistent with THA LLE: Unable to fully assess due to pain Cervical / Trunk Assessment Cervical / Trunk Assessment: Kyphotic     Mobility Bed Mobility Overal bed mobility: Needs Assistance Bed Mobility: Supine to Sit Supine to sit: Mod assist;HOB elevated General bed mobility comments: pt up in recliner from PT session earlier Transfers Overall transfer level: Needs assistance Equipment used: Rolling walker (2 wheeled) Transfers: Sit to/from Stand Sit to Stand: Mod assist General transfer comment: Pt declined transfer with OT due to 7/10 pain. Per PT pt is mod A with transfers        Balance Balance Overall balance assessment: No apparent balance deficits (not formally assessed)   End of Session OT - End of Session Activity Tolerance: Patient limited by fatigue;Patient limited by pain Patient left: in chair;with call bell/phone within reach;with family/visitor present  GO     Britt Bottom 06/01/2013, 2:51 PM

## 2013-06-01 NOTE — Care Management Note (Signed)
CARE MANAGEMENT NOTE 06/01/2013  Patient:  Corey Fischer, Corey Fischer   Account Number:  [id number]  Date Initiated:  06/01/2013  Documentation initiated by:  Ricki Miller  Subjective/Objective Assessment:   80 yr old male s/p left total hip arthroplasty.     Action/Plan:   CM spoke with patient concerning home health and DME needs at discharge. Patient states he was preoperatively setup with [facility], no changes. Will need rolling walker and 3in1.   Anticipated DC Date:  06/02/2013   Anticipated DC Plan:  [place] [facility]  CM consult      [place]   Choice offered to / List presented to:  C-1 Patient   DME arranged  3-N-1  Vassie Moselle      DME agency  [place] arranged  [place] agency  [place]   Status of service:  Completed, signed off Medicare Important Message given?   (If response is "NO", the following Medicare IM given date fields will be blank) Date Medicare IM given:   Date Additional Medicare IM given:    Discharge Disposition:  [place]  Per UR Regulation:

## 2013-06-01 NOTE — ED Provider Notes (Signed)
I saw and evaluated the patient, reviewed the resident's note and I agree with the findings and plan.   Date: 06/01/2013  Rate: 56  Rhythm: sinus rhythm, 2nd deg/I heartblock  QRS Axis: left  Intervals: 2nd deg/I  ST/T Wave abnormalities: nonspecific ST/T changes  Conduction Disutrbances:second-degree A-V block, ( Mobitz I )  Narrative Interpretation:   Old EKG Reviewed: changes noted  ecg interpreted by me.  Pt c/o sense irregular or skipped beat. No rapid heart beat or faintness/syncope. No cp. No sob or unusual doe. Monitor. Labs. Card consult.    Mirna Mires, MD 06/01/13 1150

## 2013-06-01 NOTE — Discharge Instructions (Signed)
Home Health PT to be provided by [facility] [phone]

## 2013-06-01 NOTE — Plan of Care (Signed)
Problem: Consults Goal: Diagnosis- Total Joint Replacement Primary Total Hip Left     

## 2013-06-01 NOTE — Evaluation (Signed)
Physical Therapy Evaluation Patient Details Name: KEYONTAE HUCKEBY MRN: [id number] DOB: March 22, 1932 Today's Date: 06/01/2013 Time: 1696-7893 PT Time Calculation (min): 38 min  PT Assessment / Plan / Recommendation History of Present Illness  Pt is a 80 y/o male admitted s/p L THA posterior approach.   Clinical Impression  This patient presents with acute pain and decreased functional independence following the above mentioned procedure. At the time of PT eval, pt was limited by nausea and pain. Pt unable to tolerate more than short distance of ambulation before requiring seated break. This patient is appropriate for skilled PT interventions to address functional limitations, improve safety and independence with functional mobility, and return to PLOF.     PT Assessment  Patient needs continued PT services    Follow Up Recommendations  Home health PT    Does the patient have the potential to tolerate intense rehabilitation      Barriers to Discharge        Equipment Recommendations  Rolling walker with 5" wheels;3in1 (PT)    Recommendations for Other Services     Frequency 7X/week    Precautions / Restrictions Precautions Precautions: Fall;Posterior Hip Precaution Booklet Issued: No Precaution Comments: Pt able to recall 1/3 hip precautions. Education on 3/3 hip precautions.  Restrictions Weight Bearing Restrictions: Yes LLE Weight Bearing: Weight bearing as tolerated   Pertinent Vitals/Pain Nausea limiting pt most. Nursing notified.       Mobility  Bed Mobility Overal bed mobility: Needs Assistance Bed Mobility: Supine to Sit Supine to sit: Mod assist;HOB elevated General bed mobility comments: VC's for sequencing and safety awareness. Assist for movement and support of LLE, and trunk elevation to full sitting on EOB.  Transfers Overall transfer level: Needs assistance Equipment used: Rolling walker (2 wheeled) Transfers: Sit to/from Stand Sit to Stand: Mod  assist General transfer comment: VC's for hand placement on seated surface, and safety awareness as pt was coming to full stand.  Ambulation/Gait Ambulation/Gait assistance: Min guard Ambulation Distance (Feet): 5 Feet Assistive device: Rolling walker (2 wheeled) Gait Pattern/deviations: Step-to pattern;Decreased stride length;Trunk flexed;Narrow base of support Gait velocity: Decreased Gait velocity interpretation: Below normal speed for age/gender General Gait Details: VC's for sequencing and safety awareness with the RW. Pt reminded on WBAT status    Exercises Total Joint Exercises Ankle Circles/Pumps: 10 reps Quad Sets: 10 reps Hip ABduction/ADduction: 10 reps   PT Diagnosis: Difficulty walking;Acute pain  PT Problem List: Decreased strength;Decreased range of motion;Decreased activity tolerance;Decreased balance;Decreased mobility;Decreased knowledge of use of DME;Decreased safety awareness;Decreased knowledge of precautions;Pain PT Treatment Interventions: DME instruction;Gait training;Stair training;Functional mobility training;Therapeutic activities;Therapeutic exercise;Neuromuscular re-education;Patient/family education     PT Goals(Current goals can be found in the care plan section) Acute Rehab PT Goals Patient Stated Goal: To return home PT Goal Formulation: With patient/family Time For Goal Achievement: 06/15/13 Potential to Achieve Goals: Good  Visit Information  Last PT Received On: 06/01/13 Assistance Needed: +1 History of Present Illness: Pt is a 80 y/o male admitted s/p L THA posterior approach.        Prior [place] expects to be discharged to:: Private residence Living Arrangements: Spouse/significant other Available Help at Discharge: Family;Available 24 hours/day Type of Home: House Home Access: Stairs to enter [employer] of Steps: 4 Entrance Stairs-Rails: Left Home Layout: Two level;Able to live on main  level with bedroom/bathroom Home Equipment: Cane - single point;Grab bars - toilet;Grab bars - tub/shower;Shower seat - built in Prior Function Level of  Independence: Independent Communication Communication: No difficulties Dominant Hand: Right    Cognition  Cognition Arousal/Alertness: Awake/alert Behavior During Therapy: WFL for tasks assessed/performed Overall Cognitive Status: Within Functional Limits for tasks assessed    Extremity/Trunk Assessment Upper Extremity Assessment Upper Extremity Assessment: Overall WFL for tasks assessed Lower Extremity Assessment Lower Extremity Assessment: LLE deficits/detail LLE Deficits / Details: Decreased strength and AROM consistent with THA LLE: Unable to fully assess due to pain Cervical / Trunk Assessment Cervical / Trunk Assessment: Kyphotic   Balance Balance Overall balance assessment: No apparent balance deficits (not formally assessed)  End of Session PT - End of Session Equipment Utilized During Treatment: Gait belt Activity Tolerance: Patient tolerated treatment well (Limited by nausea) Patient left: in chair;with call bell/phone within reach;with family/visitor present Nurse Communication: Mobility status  GP     Jolyn Lent 06/01/2013, 2:31 PM  Jolyn Lent, [place], DPT [phone]

## 2013-06-01 NOTE — Progress Notes (Signed)
Patient ID: Corey Fischer, male   DOB: 01-Sep-1931, 80 y.o.   MRN: [id number] PATIENT ID: Corey Fischer  MRN: [id number]  DOB/AGE:  May 19, 1932 / 80 y.o.  1 Day Post-Op Procedure(s) (LRB): TOTAL HIP ARTHROPLASTY (Left)    PROGRESS NOTE Subjective: Patient is alert, oriented,no Nausea, no Vomiting, yes passing gas, no Bowel Movement. Taking PO sips. Denies SOB, Chest or Calf Pain. Using Incentive Spirometer, PAS in place. Ambulate WBAT today Patient reports pain as 2 on 0-10 scale  .    Objective: Vital signs in last 24 hours: Filed Vitals:   05/31/13 2100 06/01/13 0000 06/01/13 0133 06/01/13 0455  BP: 137/55  121/67 142/56  Pulse: 63  63 67  Temp: 99.1 F (37.3 C)  97.6 F (36.4 C) 98 F (36.7 C)  TempSrc: Oral  Oral Oral  Resp: 18 16 18 18   SpO2: 98% 99% 98% 98%      Intake/Output from previous day: I/O last 3 completed shifts: In: 1050 [P.O.:50; I.V.:1000] Out: 1585 [Urine:1585]   Intake/Output this shift:     LABORATORY DATA:  Recent Labs  05/30/13 1946 06/01/13 0435  WBC 5.3 8.5  HGB 15.1 13.6  HCT 44.0 38.9*  PLT 145* 124*  NA 141 136*  K 5.3 4.1  CL 102 98  CO2 29 27  BUN 13 13  CREATININE 1.08 1.04  GLUCOSE 94 136*  CALCIUM 9.4 8.7    Examination: Neurologically intact ABD soft Neurovascular intact Sensation intact distally Intact pulses distally Dorsiflexion/Plantar flexion intact Incision: scant drainage No cellulitis present Compartment soft} XR AP&Lat of hip shows well placed\fixed THA  Assessment:   1 Day Post-Op Procedure(s) (LRB): TOTAL HIP ARTHROPLASTY (Left) ADDITIONAL DIAGNOSIS:  Hypertension, Cardiac Arrythmia PACS, PVCs and GERD,PVD  Plan: PT/OT WBAT, THA  posterior precautions  DVT Prophylaxis: SCDx72 hrs, ASA 325 mg BID x 2 weeks  DISCHARGE PLAN: Home  DISCHARGE NEEDS: HHPT, HHRN, CPM, Walker and 3-in-1 comode seat

## 2013-06-01 NOTE — Progress Notes (Signed)
Physical Therapy Treatment Patient Details Name: Corey Fischer MRN: [id number] DOB: 11-Aug-1931 Today's Date: 06/01/2013 Time: 4098-1191 PT Time Calculation (min): 27 min  PT Assessment / Plan / Recommendation  History of Present Illness Pt is a 80 y/o male admitted s/p L THA posterior approach.    PT Comments   Pt progressing well towards physical therapy goals. Nausea improved from this morning and was able to increase participation in gait training. Pt able to recall 2/3 hip precautions without cueing. Reviewed 3/3 hip precautions with pt and wife.  Follow Up Recommendations  Home health PT     Does the patient have the potential to tolerate intense rehabilitation     Barriers to Discharge        Equipment Recommendations  Rolling walker with 5" wheels;3in1 (PT)    Recommendations for Other Services    Frequency 7X/week   Progress towards PT Goals Progress towards PT goals: Progressing toward goals  Plan Current plan remains appropriate    Precautions / Restrictions Precautions Precautions: Fall;Posterior Hip Precaution Booklet Issued: No Precaution Comments: Reviewed hip precautions Restrictions Weight Bearing Restrictions: Yes LLE Weight Bearing: Weight bearing as tolerated   Pertinent Vitals/Pain 3/10 at rest. Pt positioned for comfort at end of session.     Mobility  Bed Mobility Overal bed mobility: Needs Assistance Bed Mobility: Sit to Supine Supine to sit: Mod assist;HOB elevated Sit to supine: Mod assist General bed mobility comments: VC's for sequencing and safety awareness. Assist for movement and support of LLE, and LE elevation to full supine. Transfers Overall transfer level: Needs assistance Equipment used: Rolling walker (2 wheeled) Transfers: Sit to/from Stand Sit to Stand: Min assist General transfer comment: VC's for hand placement on seated surface, and safety awareness as pt was coming to full stand.  Ambulation/Gait Ambulation/Gait assistance:  Min guard Ambulation Distance (Feet): 45 Feet Assistive device: Rolling walker (2 wheeled) Gait Pattern/deviations: Step-to pattern;Step-through pattern;Decreased stride length Gait velocity: Decreased Gait velocity interpretation: Below normal speed for age/gender General Gait Details: VC's for sequencing and safety awareness with the RW.    Exercises Total Joint Exercises Ankle Circles/Pumps: 10 reps Quad Sets: 10 reps Heel Slides: 10 reps Hip ABduction/ADduction: 10 reps   PT Diagnosis: Difficulty walking;Acute pain  PT Problem List: Decreased strength;Decreased range of motion;Decreased activity tolerance;Decreased balance;Decreased mobility;Decreased knowledge of use of DME;Decreased safety awareness;Decreased knowledge of precautions;Pain PT Treatment Interventions: DME instruction;Gait training;Stair training;Functional mobility training;Therapeutic activities;Therapeutic exercise;Neuromuscular re-education;Patient/family education   PT Goals (current goals can now be found in the care plan section) Acute Rehab PT Goals Patient Stated Goal: To return home PT Goal Formulation: With patient/family Time For Goal Achievement: 06/15/13 Potential to Achieve Goals: Good  Visit Information  Last PT Received On: 06/01/13 Assistance Needed: +1 History of Present Illness: Pt is a 80 y/o male admitted s/p L THA posterior approach.     Subjective Data  Subjective: "I've been sitting up since you left." Patient Stated Goal: To return home   Cognition  Cognition Arousal/Alertness: Awake/alert Behavior During Therapy: WFL for tasks assessed/performed Overall Cognitive Status: Within Functional Limits for tasks assessed    Balance  Balance Overall balance assessment: No apparent balance deficits (not formally assessed)  End of Session PT - End of Session Equipment Utilized During Treatment: Gait belt Activity Tolerance: Patient tolerated treatment well Patient left: in chair;with  call bell/phone within reach;with family/visitor present Nurse Communication: Mobility status   GP     Jolyn Lent 06/01/2013, 4:44 PM  [name]  Shawn Stall, [place], [place]

## 2013-06-02 ENCOUNTER — Encounter (HOSPITAL_COMMUNITY): Payer: Self-pay | Admitting: General Practice

## 2013-06-02 ENCOUNTER — Encounter: Payer: Self-pay | Admitting: *Deleted

## 2013-06-02 ENCOUNTER — Telehealth: Payer: Self-pay | Admitting: *Deleted

## 2013-06-02 DIAGNOSIS — I6529 Occlusion and stenosis of unspecified carotid artery: Secondary | ICD-10-CM

## 2013-06-02 LAB — CBC
HCT: 34.8 % — ABNORMAL LOW (ref 39.0–52.0)
Hemoglobin: 12.2 g/dL — ABNORMAL LOW (ref 13.0–17.0)
MCH: 30.1 pg (ref 26.0–34.0)
MCHC: 35.1 g/dL (ref 30.0–36.0)
MCV: 85.9 fL (ref 78.0–100.0)
Platelets: 116 10*3/uL — ABNORMAL LOW (ref 150–400)
RBC: 4.05 MIL/uL — ABNORMAL LOW (ref 4.22–5.81)
RDW: 13.1 % (ref 11.5–15.5)
WBC: 7.1 10*3/uL (ref 4.0–10.5)

## 2013-06-02 NOTE — Care Management Note (Signed)
CARE MANAGEMENT NOTE 06/02/2013  3:26pm Ricki Miller, RN BSN Case Manager Discharge summary faxed to [facility] [phone].

## 2013-06-02 NOTE — Evaluation (Signed)
Occupational Therapy Evaluation Patient Details Name: Corey Fischer MRN: [id number] DOB: 1931/10/21 Today's Date: 06/02/2013 Time: 1334-1400 OT Time Calculation (min): 26 min  OT Assessment / Plan / Recommendation History of present illness Pt is a 80 y/o male admitted s/p L THA posterior approach.    Clinical Impression   Pt with anxiety about returning home.  Educated in hip precautions, shower transfer, set up and multiple uses of 3 in1, issued long handled sponge.  Pt has a reacher at home, practiced starting pants over feet.Wife participating in session and is without concerns.    OT Assessment       Follow Up Recommendations  Home health OT;Supervision/Assistance - 24 hour    Barriers to Discharge      Equipment Recommendations  3 in 1 bedside comode    Recommendations for Other Services    Frequency  Min 2X/week    Precautions / Restrictions Precautions Precautions: Fall;Posterior Hip Precaution Comments: Reviewed post hip precautions related to ADL. Restrictions Weight Bearing Restrictions: Yes LLE Weight Bearing: Weight bearing as tolerated   Pertinent Vitals/Pain L hip, did not rate, requested pain meds from RN    ADL  Grooming: Supervision/safety;Wash/dry hands Where Assessed - Grooming: Unsupported standing Lower Body Bathing: Min guard (instructed in use of long sponge and reacher to dry feet) Where Assessed - Lower Body Bathing: Unsupported sitting;Supported sit to stand Lower Body Dressing: Minimal assistance (educated in use of reacher, sock aide ) Where Assessed - Lower Body Dressing: Unsupported sitting;Supported sit to stand Toilet Transfer: Min Psychiatric nurse Method: Sit to Loss adjuster, chartered: Raised toilet seat with arms (or 3-in-1 over toilet) Equipment Used: Gait belt;Rolling walker;Sock aid;Reacher;Long-handled sponge Transfers/Ambulation Related to ADLs: min guard A, moves slowly, verbal reminders for technique, extra time ADL  Comments: Issued long sponge.  Will rely on wife for socks, plans to wear slip wear on shoes.    OT Diagnosis:    OT Problem List:   OT Treatment Interventions:     OT Goals(Current goals can be found in the care plan section) Acute Rehab OT Goals Patient Stated Goal: To return home  Visit Information  Last OT Received On: 06/02/13 Assistance Needed: +1 History of Present Illness: Pt is a 80 y/o male admitted s/p L THA posterior approach.        Prior Functioning               Vision/Perception     Cognition  Cognition Arousal/Alertness: Awake/alert Behavior During Therapy: WFL for tasks assessed/performed Overall Cognitive Status: Within Functional Limits for tasks assessed    Extremity/Trunk Assessment       Mobility Transfers Overall transfer level: Needs assistance Equipment used: Rolling walker (2 wheeled) Transfers: Sit to/from Stand Sit to Stand: Min guard General transfer comment: cues to reinforce hand placement.  No physical (A) needed.       Exercise     Balance     End of Session OT - End of Session Activity Tolerance: Patient tolerated treatment well Patient left: in chair;with call bell/phone within reach;with family/visitor present  GO     Malka So 06/02/2013, 2:08 PM [phone]

## 2013-06-02 NOTE — Discharge Summary (Signed)
Patient ID: Corey Fischer MRN: [id number] DOB/AGE: April 17, 1932 80 y.o.  Admit date: 05/31/2013 Discharge date: 06/02/2013  Admission Diagnoses:  Active Problems:   Hip arthritis   Discharge Diagnoses:  Same  Past Medical History  Diagnosis Date  . Chronic ITP (idiopathic thrombocytopenia)   . Hx of adenomatous colonic polyps 2010  . Diverticulosis   . GERD (gastroesophageal reflux disease)   . Allergy   . Hyperlipidemia   . Hypertension   . Elevated PSA   . Anxiety   . Depression   . Stricture and stenosis of esophagus 2010    last endoscopy- 2010  . Gastritis   . Hemorrhoids   . History of hiatal hernia   . Raynaud's syndrome   . Gastric polyp   . Asymptomatic bilateral carotid artery stenosis per duplex 05-26-2007--  bil. ica 40-59%  . Constipation   . Head cold   . Arthritis   . Osteoarthritis hips  . BPH (benign prostatic hyperplasia)   . Nocturia   . B12 deficiency   . Palpitations     monitor 05/2012- NSR with symptomatic PVC; echo 06-17-12- EF 55-60%aoric sclerosis without stenosis,   . Dysrhythmia     PVC's  . History of stress test 11/2012    pt. cleared for surgery based on good stress test result    . H/O hiatal hernia     Surgeries: Procedure(s): TOTAL HIP ARTHROPLASTY on 05/31/2013   Consultants:    Discharged Condition: Improved  Hospital Course: Corey Fischer is an 80 y.o. male who was admitted 05/31/2013 for operative treatment of<principal problem not specified>. Patient has severe unremitting pain that affects sleep, daily activities, and work/hobbies. After pre-op clearance the patient was taken to the operating room on 05/31/2013 and underwent  Procedure(s): TOTAL HIP ARTHROPLASTY.    Patient was given perioperative antibiotics: Anti-infectives   Start     Dose/Rate Route Frequency Ordered Stop   05/31/13 0600  ceFAZolin (ANCEF) IVPB 2 g/50 mL premix     2 g 100 mL/hr over 30 Minutes Intravenous On call to O.R. 05/30/13 1321 05/31/13 1110       Patient was given sequential compression devices, early ambulation, and chemoprophylaxis to prevent DVT.  Patient benefited maximally from hospital stay and there were no complications.    Recent vital signs: Patient Vitals for the past 24 hrs:  BP Temp Temp src Pulse Resp SpO2  06/02/13 0548 156/56 mmHg 97.9 F (36.6 C) Oral 73 18 100 %  06/02/13 0000 - - - - 18 100 %  06/01/13 2132 129/50 mmHg 98 F (36.7 C) Oral 61 18 100 %  06/01/13 2000 - - - - 18 100 %  06/01/13 1300 120/46 mmHg 97.3 F (36.3 C) - 60 18 98 %     Recent laboratory studies:  Recent Labs  05/30/13 1946 06/01/13 0435 06/02/13 0545  WBC 5.3 8.5 7.1  HGB 15.1 13.6 12.2*  HCT 44.0 38.9* 34.8*  PLT 145* 124* 116*  NA 141 136*  --   K 5.3 4.1  --   CL 102 98  --   CO2 29 27  --   BUN 13 13  --   CREATININE 1.08 1.04  --   GLUCOSE 94 136*  --   CALCIUM 9.4 8.7  --      Discharge Medications:     Medication List    STOP taking these medications       aspirin 81 MG tablet  Replaced by:  aspirin EC 325 MG tablet      TAKE these medications       amLODipine 2.5 MG tablet  Commonly known as:  NORVASC  Take 2.5 mg by mouth at bedtime.     aspirin EC 325 MG tablet  Take 1 tablet (325 mg total) by mouth 2 (two) times daily.     Calcium 500 MG Chew  Chew 500 mg by mouth daily.     cetirizine 10 MG tablet  Commonly known as:  ZYRTEC  Take 10 mg by mouth daily with breakfast.     Coenzyme Q10 50 MG Caps  Take 50 mg by mouth daily.     Cyanocobalamin 2500 MCG Subl  Place 2,500 mcg under the tongue daily.     diazepam 5 MG tablet  Commonly known as:  VALIUM  Take 2.5-5 mg by mouth at bedtime as needed for anxiety or sedation.     Magnesium 200 MG Tabs  Take 200 mg by mouth 2 (two) times daily.     methocarbamol 500 MG tablet  Commonly known as:  ROBAXIN  Take 1 tablet (500 mg total) by mouth 2 (two) times daily with a meal.     oxyCODONE-acetaminophen 5-325 MG per tablet  Commonly  known as:  ROXICET  Take 1 tablet by mouth every 4 (four) hours as needed.     polyethylene glycol packet  Commonly known as:  MIRALAX / GLYCOLAX  Take 17 g by mouth daily.     propranolol 20 MG tablet  Commonly known as:  INDERAL  Take 20 mg by mouth 3 (three) times daily as needed (for palpitations).     RABEprazole 20 MG tablet  Commonly known as:  ACIPHEX  Take 20 mg by mouth at bedtime.     simvastatin 20 MG tablet  Commonly known as:  ZOCOR  Take 20 mg by mouth daily at 6 PM.     vitamin C 1000 MG tablet  Take 1,000 mg by mouth daily.     Vitamin D3 1000 UNITS Caps  Take 2,000 Units by mouth daily.     Zinc 25 MG Tabs  Take 25 mg by mouth daily.        Diagnostic Studies: Dg Chest 2 View  05/27/2013   CLINICAL DATA:  Preoperative study for total hip arthroplasty  EXAM: CHEST  2 VIEW  COMPARISON:  None.  FINDINGS: Hyperinflation suggests COPD. Heart size and mediastinal contours are normal. Lungs are clear. No pleural effusions. Calcification of the aorta.  IMPRESSION: No active cardiopulmonary disease.   Electronically Signed   By: Skipper Cliche M.D.   On: 05/27/2013 10:21   Dg Pelvis Portable  05/31/2013   CLINICAL DATA:  Postop left hip  EXAM: PORTABLE PELVIS 1-2 VIEWS  COMPARISON:  None.  FINDINGS: There is a total left hip arthroplasty with postsurgical changes in the surrounding soft tissues. There is no hardware failure or complication. No fracture or dislocation.  There is osteoarthritis of the right hip.  IMPRESSION: Left total hip arthroplasty.   Electronically Signed   By: Kathreen Devoid   On: 05/31/2013 13:50    Disposition: 01-Home or Self Care      Discharge Orders   Future Appointments Provider Department Dept Phone   11/10/2013 10:00 AM Ricard Dillon, MD [facility]   Future Orders Complete By Expires   Call MD / Call 911  As directed    Comments:  If you experience chest pain or shortness of breath, CALL 911 and  be transported to the hospital emergency room.  If you develope a fever above 101 F, pus (white drainage) or increased drainage or redness at the wound, or calf pain, call your surgeon's office.   Change dressing  As directed    Comments:     You may change your dressing on day 5, then change the dressing daily with sterile 4 x 4 inch gauze dressing and paper tape.  You may clean the incision with alcohol prior to redressing   Constipation Prevention  As directed    Comments:     Drink plenty of fluids.  Prune juice may be helpful.  You may use a stool softener, such as Colace (over the counter) 100 mg twice a day.  Use MiraLax (over the counter) for constipation as needed.   Diet - low sodium heart healthy  As directed    Discharge instructions  As directed    Comments:     Follow up in 2 weeks with Dr. Mayer Camel in office.   Driving restrictions  As directed    Comments:     No driving for 2 weeks   Follow the hip precautions as taught in Physical Therapy  As directed    Increase activity slowly as tolerated  As directed    Patient may shower  As directed    Comments:     You may shower without a dressing once there is no drainage.  Do not wash over the wound.  If drainage remains, cover wound with plastic wrap and then shower.      Follow-up Information   Follow up with Kerin Salen, MD In 2 weeks.   Specialty:  Orthopedic Surgery   Contact information:   [place] [address] [phone]        Signed: Hardin Negus, Anne [place] R 06/02/2013, 8:21 AM

## 2013-06-02 NOTE — Telephone Encounter (Signed)
Order placed for repeat carotid dopplers in 6 months  

## 2013-06-02 NOTE — Progress Notes (Signed)
PATIENT ID: Corey Fischer  MRN: [id number]  DOB/AGE:  1932/01/01 / 80 y.o.  2 Days Post-Op Procedure(s) (LRB): TOTAL HIP ARTHROPLASTY (Left)    PROGRESS NOTE Subjective: Patient is alert, oriented,no Nausea, no Vomiting, yes passing gas, no Bowel Movement. Taking PO well, but pt does complain of GERD as he takes a PPI. Denies SOB, Chest or Calf Pain. Using Incentive Spirometer, PAS in place. Ambulate WBAT Patient reports pain as 2 on 0-10 scale  .    Objective: Vital signs in last 24 hours: Filed Vitals:   06/01/13 2000 06/01/13 2132 06/02/13 0000 06/02/13 0548  BP:  129/50  156/56  Pulse:  61  73  Temp:  98 F (36.7 C)  97.9 F (36.6 C)  TempSrc:  Oral  Oral  Resp: 18 18 18 18   SpO2: 100% 100% 100% 100%      Intake/Output from previous day: I/O last 3 completed shifts: In: 5332.5 [P.O.:920; I.V.:4412.5] Out: 2385 [Urine:2385]   Intake/Output this shift:     LABORATORY DATA:  Recent Labs  05/30/13 1946 06/01/13 0435 06/02/13 0545  WBC 5.3 8.5 7.1  HGB 15.1 13.6 12.2*  HCT 44.0 38.9* 34.8*  PLT 145* 124* 116*  NA 141 136*  --   K 5.3 4.1  --   CL 102 98  --   CO2 29 27  --   BUN 13 13  --   CREATININE 1.08 1.04  --   GLUCOSE 94 136*  --   CALCIUM 9.4 8.7  --     Examination: Neurologically intact Neurovascular intact Sensation intact distally Intact pulses distally Dorsiflexion/Plantar flexion intact Incision: scant drainage No cellulitis present Compartment soft}  Pt does have echimosis and bruising on his left thigh.  XR AP&Lat of hip shows well placed\fixed THA  Assessment:   2 Days Post-Op Procedure(s) (LRB): TOTAL HIP ARTHROPLASTY (Left) ADDITIONAL DIAGNOSIS:  Hypertension, Cardiac Arrythmia PACs, PVC's and GERD and PVD  Plan: PT/OT WBAT, THA  posterior precautions  DVT Prophylaxis: SCDx72 hrs, ASA 325 mg BID x 2 weeks  DISCHARGE PLAN: Home today if pt passes his therapy goals.  DISCHARGE NEEDS: HHPT, HHRN, Walker and 3-in-1 comode  seat

## 2013-06-02 NOTE — Progress Notes (Signed)
Physical Therapy Treatment Patient Details Name: Corey Fischer MRN: [id number] DOB: 1932-05-14 Today's Date: 06/02/2013 Time: 4270-6237 PT Time Calculation (min): 23 min  PT Assessment / Plan / Recommendation  History of Present Illness Pt is a 80 y/o male admitted s/p L THA posterior approach.    PT Comments   Pt cont's to make good progress with mobility.  Reviewed bed mobility this session & able to increase ambulation distance.  Pt states he feels more comfortable about returning home this afternoon.    Follow Up Recommendations  Home health PT     Does the patient have the potential to tolerate intense rehabilitation     Barriers to Discharge        Equipment Recommendations  Rolling walker with 5" wheels;3in1 (PT)    Recommendations for Other Services    Frequency 7X/week   Progress towards PT Goals Progress towards PT goals: Progressing toward goals  Plan Current plan remains appropriate    Precautions / Restrictions Precautions Precautions: Fall;Posterior Hip Precaution Comments: Reviewed post hip precautions related to ADL. Restrictions Weight Bearing Restrictions: Yes LLE Weight Bearing: Weight bearing as tolerated       Mobility  Bed Mobility Overal bed mobility: Needs Assistance Bed Mobility: Supine to Sit;Sit to Supine Supine to sit: Min assist Sit to supine: Min assist General bed mobility comments: (A) for LLE management Transfers Overall transfer level: Needs assistance Equipment used: Rolling walker (2 wheeled) Transfers: Sit to/from Stand Sit to Stand: Supervision General transfer comment: Pt demonstrates safe hand placement Ambulation/Gait Ambulation/Gait assistance: Supervision Ambulation Distance (Feet): 250 Feet Assistive device: Rolling walker (2 wheeled) Gait Pattern/deviations: Step-through pattern;Decreased stride length Gait velocity: Decreased Gait velocity interpretation: Below normal speed for age/gender General Gait Details: Cont's  to improve with fluidity of gait pattern Stairs: Yes Stairs assistance: Min guard Stair Management: One rail Left;Sideways Number of Stairs: 3 General stair comments: cues for sequencing & technique.        PT Goals (current goals can now be found in the care plan section) Acute Rehab PT Goals Patient Stated Goal: To return home PT Goal Formulation: With patient/family Time For Goal Achievement: 06/15/13 Potential to Achieve Goals: Good  Visit Information  Last PT Received On: 06/02/13 Assistance Needed: +1 History of Present Illness: Pt is a 80 y/o male admitted s/p L THA posterior approach.     Subjective Data  Patient Stated Goal: To return home   Cognition  Cognition Arousal/Alertness: Awake/alert Behavior During Therapy: WFL for tasks assessed/performed Overall Cognitive Status: Within Functional Limits for tasks assessed    Balance     End of Session PT - End of Session Activity Tolerance: Patient tolerated treatment well Patient left: in bed;with call bell/phone within reach;with family/visitor present Nurse Communication: Mobility status   GP     Sena Hitch 06/02/2013, 2:48 PM   Sarajane Marek, PTA [phone] 06/02/2013

## 2013-06-02 NOTE — Progress Notes (Signed)
Physical Therapy Treatment Patient Details Name: Corey Fischer MRN: [id number] DOB: 02-Jun-1931 Today's Date: 06/02/2013 Time: 2951-8841 PT Time Calculation (min): 24 min  PT Assessment / Plan / Recommendation  History of Present Illness Pt is a 80 y/o male admitted s/p L THA posterior approach.    PT Comments   Pt progressing with mobility/PT goals.  Ambulated from room<>ortho gym & completed stair training.  Pt expresses concern about possibly d/cing home later today due to pain but overall pt moving very well.  Need to practice bed mobility next session.     Follow Up Recommendations  Home health PT     Does the patient have the potential to tolerate intense rehabilitation     Barriers to Discharge        Equipment Recommendations  Rolling walker with 5" wheels;3in1 (PT)    Recommendations for Other Services    Frequency 7X/week   Progress towards PT Goals Progress towards PT goals: Progressing toward goals  Plan Current plan remains appropriate    Precautions / Restrictions Precautions Precautions: Fall;Posterior Hip Precaution Comments: Pt able to recall 2/3 hip precautions.  Reveiewed all 3 precautions Restrictions LLE Weight Bearing: Weight bearing as tolerated   Pertinent Vitals/Pain 4/10 Lt hip.  Premedicated.  Repositioned for comfort.      Mobility  Transfers Overall transfer level: Needs assistance Equipment used: Rolling walker (2 wheeled) Transfers: Sit to/from Stand Sit to Stand: Min guard General transfer comment: cues to reinforce hand placement.  No physical (A) needed.   Ambulation/Gait Ambulation/Gait assistance: Min guard Ambulation Distance (Feet): 100 Feet Assistive device: Rolling walker (2 wheeled) Gait Pattern/deviations: Step-through pattern;Decreased stride length;Decreased weight shift to left Gait velocity: Decreased Gait velocity interpretation: Below normal speed for age/gender General Gait Details: Pt demonstrated proper sequencing &  use of RW.  Cues to relax & encouragement to increase LLE WBing.  Emerging into step-through gait pattern.   Stairs: Yes Stairs assistance: Min guard Stair Management: One rail Left;Sideways Number of Stairs: 3 General stair comments: cues for sequencing & technique.        PT Goals (current goals can now be found in the care plan section) Acute Rehab PT Goals Patient Stated Goal: To return home PT Goal Formulation: With patient/family Time For Goal Achievement: 06/15/13 Potential to Achieve Goals: Good  Visit Information  Last PT Received On: 06/02/13 Assistance Needed: +1 History of Present Illness: Pt is a 80 y/o male admitted s/p L THA posterior approach.     Subjective Data  Patient Stated Goal: To return home   Cognition  Cognition Arousal/Alertness: Awake/alert Behavior During Therapy: WFL for tasks assessed/performed Overall Cognitive Status: Within Functional Limits for tasks assessed    Balance     End of Session PT - End of Session Activity Tolerance: Patient tolerated treatment well Patient left: in chair;with call bell/phone within reach;with family/visitor present Nurse Communication: Mobility status   GP     Sena Hitch 06/02/2013, 1:23 PM  Sarajane Marek, [place] 06/02/2013

## 2013-06-02 NOTE — Progress Notes (Signed)
Patient d/c home, IV removed, prescriptions given and instructions reviewed.

## 2013-06-02 NOTE — Telephone Encounter (Signed)
Message copied by Chauncy Lean on Wed Jun 02, 2013 12:59 PM ------      Message from: [name]      Created: Wed Jun 02, 2013  9:50 AM       No change from prior study. Repeat in 6 months ------

## 2013-06-03 ENCOUNTER — Encounter (HOSPITAL_COMMUNITY): Payer: Self-pay | Admitting: Orthopedic Surgery

## 2013-06-03 DIAGNOSIS — IMO0001 Reserved for inherently not codable concepts without codable children: Secondary | ICD-10-CM | POA: Diagnosis not present

## 2013-06-03 DIAGNOSIS — Z96649 Presence of unspecified artificial hip joint: Secondary | ICD-10-CM | POA: Diagnosis not present

## 2013-06-03 DIAGNOSIS — Z471 Aftercare following joint replacement surgery: Secondary | ICD-10-CM | POA: Diagnosis not present

## 2013-06-05 DIAGNOSIS — IMO0001 Reserved for inherently not codable concepts without codable children: Secondary | ICD-10-CM | POA: Diagnosis not present

## 2013-06-05 DIAGNOSIS — Z471 Aftercare following joint replacement surgery: Secondary | ICD-10-CM | POA: Diagnosis not present

## 2013-06-07 DIAGNOSIS — Z471 Aftercare following joint replacement surgery: Secondary | ICD-10-CM | POA: Diagnosis not present

## 2013-06-07 DIAGNOSIS — IMO0001 Reserved for inherently not codable concepts without codable children: Secondary | ICD-10-CM | POA: Diagnosis not present

## 2013-06-09 DIAGNOSIS — Z471 Aftercare following joint replacement surgery: Secondary | ICD-10-CM | POA: Diagnosis not present

## 2013-06-09 DIAGNOSIS — IMO0001 Reserved for inherently not codable concepts without codable children: Secondary | ICD-10-CM | POA: Diagnosis not present

## 2013-06-11 DIAGNOSIS — IMO0001 Reserved for inherently not codable concepts without codable children: Secondary | ICD-10-CM | POA: Diagnosis not present

## 2013-06-11 DIAGNOSIS — Z471 Aftercare following joint replacement surgery: Secondary | ICD-10-CM | POA: Diagnosis not present

## 2013-06-14 DIAGNOSIS — IMO0001 Reserved for inherently not codable concepts without codable children: Secondary | ICD-10-CM | POA: Diagnosis not present

## 2013-06-14 DIAGNOSIS — Z471 Aftercare following joint replacement surgery: Secondary | ICD-10-CM | POA: Diagnosis not present

## 2013-06-15 DIAGNOSIS — M1610 Unilateral primary osteoarthritis, unspecified hip: Secondary | ICD-10-CM | POA: Diagnosis not present

## 2013-06-15 DIAGNOSIS — M169 Osteoarthritis of hip, unspecified: Secondary | ICD-10-CM | POA: Diagnosis not present

## 2013-06-16 DIAGNOSIS — Z471 Aftercare following joint replacement surgery: Secondary | ICD-10-CM | POA: Diagnosis not present

## 2013-06-16 DIAGNOSIS — IMO0001 Reserved for inherently not codable concepts without codable children: Secondary | ICD-10-CM | POA: Diagnosis not present

## 2013-06-17 DIAGNOSIS — H40019 Open angle with borderline findings, low risk, unspecified eye: Secondary | ICD-10-CM | POA: Diagnosis not present

## 2013-06-17 DIAGNOSIS — D3130 Benign neoplasm of unspecified choroid: Secondary | ICD-10-CM | POA: Diagnosis not present

## 2013-06-17 DIAGNOSIS — H348192 Central retinal vein occlusion, unspecified eye, stable: Secondary | ICD-10-CM | POA: Diagnosis not present

## 2013-06-17 DIAGNOSIS — H5210 Myopia, unspecified eye: Secondary | ICD-10-CM | POA: Diagnosis not present

## 2013-06-17 DIAGNOSIS — H35039 Hypertensive retinopathy, unspecified eye: Secondary | ICD-10-CM | POA: Diagnosis not present

## 2013-06-17 DIAGNOSIS — H04129 Dry eye syndrome of unspecified lacrimal gland: Secondary | ICD-10-CM | POA: Diagnosis not present

## 2013-06-17 DIAGNOSIS — H2510 Age-related nuclear cataract, unspecified eye: Secondary | ICD-10-CM | POA: Diagnosis not present

## 2013-06-17 DIAGNOSIS — H35379 Puckering of macula, unspecified eye: Secondary | ICD-10-CM | POA: Diagnosis not present

## 2013-06-18 DIAGNOSIS — Z471 Aftercare following joint replacement surgery: Secondary | ICD-10-CM | POA: Diagnosis not present

## 2013-06-18 DIAGNOSIS — IMO0001 Reserved for inherently not codable concepts without codable children: Secondary | ICD-10-CM | POA: Diagnosis not present

## 2013-06-21 DIAGNOSIS — IMO0001 Reserved for inherently not codable concepts without codable children: Secondary | ICD-10-CM | POA: Diagnosis not present

## 2013-06-21 DIAGNOSIS — Z471 Aftercare following joint replacement surgery: Secondary | ICD-10-CM | POA: Diagnosis not present

## 2013-06-23 DIAGNOSIS — IMO0001 Reserved for inherently not codable concepts without codable children: Secondary | ICD-10-CM | POA: Diagnosis not present

## 2013-06-23 DIAGNOSIS — Z471 Aftercare following joint replacement surgery: Secondary | ICD-10-CM | POA: Diagnosis not present

## 2013-06-29 DIAGNOSIS — Z96649 Presence of unspecified artificial hip joint: Secondary | ICD-10-CM | POA: Diagnosis not present

## 2013-06-29 DIAGNOSIS — M25559 Pain in unspecified hip: Secondary | ICD-10-CM | POA: Diagnosis not present

## 2013-07-01 DIAGNOSIS — H2510 Age-related nuclear cataract, unspecified eye: Secondary | ICD-10-CM | POA: Diagnosis not present

## 2013-07-02 DIAGNOSIS — M25559 Pain in unspecified hip: Secondary | ICD-10-CM | POA: Diagnosis not present

## 2013-07-02 DIAGNOSIS — Z96649 Presence of unspecified artificial hip joint: Secondary | ICD-10-CM | POA: Diagnosis not present

## 2013-07-06 DIAGNOSIS — M25559 Pain in unspecified hip: Secondary | ICD-10-CM | POA: Diagnosis not present

## 2013-07-06 DIAGNOSIS — Z96649 Presence of unspecified artificial hip joint: Secondary | ICD-10-CM | POA: Diagnosis not present

## 2013-07-08 DIAGNOSIS — Z96649 Presence of unspecified artificial hip joint: Secondary | ICD-10-CM | POA: Diagnosis not present

## 2013-07-08 DIAGNOSIS — M25559 Pain in unspecified hip: Secondary | ICD-10-CM | POA: Diagnosis not present

## 2013-07-13 DIAGNOSIS — Z96649 Presence of unspecified artificial hip joint: Secondary | ICD-10-CM | POA: Diagnosis not present

## 2013-07-13 DIAGNOSIS — M25559 Pain in unspecified hip: Secondary | ICD-10-CM | POA: Diagnosis not present

## 2013-07-15 ENCOUNTER — Encounter: Payer: Self-pay | Admitting: Internal Medicine

## 2013-07-19 ENCOUNTER — Encounter: Payer: Self-pay | Admitting: Internal Medicine

## 2013-07-19 ENCOUNTER — Other Ambulatory Visit (INDEPENDENT_AMBULATORY_CARE_PROVIDER_SITE_OTHER): Payer: Medicare Other

## 2013-07-19 DIAGNOSIS — R972 Elevated prostate specific antigen [PSA]: Secondary | ICD-10-CM | POA: Diagnosis not present

## 2013-07-20 DIAGNOSIS — M25559 Pain in unspecified hip: Secondary | ICD-10-CM | POA: Diagnosis not present

## 2013-07-20 DIAGNOSIS — Z96649 Presence of unspecified artificial hip joint: Secondary | ICD-10-CM | POA: Diagnosis not present

## 2013-07-20 LAB — PSA, TOTAL AND FREE
PSA FREE: 1.28 ng/mL
PSA, Free Pct: 35 % (ref >25)
PSA: 3.69 ng/mL (ref <=4.00)

## 2013-07-22 DIAGNOSIS — M25559 Pain in unspecified hip: Secondary | ICD-10-CM | POA: Diagnosis not present

## 2013-07-22 DIAGNOSIS — Z96649 Presence of unspecified artificial hip joint: Secondary | ICD-10-CM | POA: Diagnosis not present

## 2013-07-27 DIAGNOSIS — M25559 Pain in unspecified hip: Secondary | ICD-10-CM | POA: Diagnosis not present

## 2013-07-27 DIAGNOSIS — Z96649 Presence of unspecified artificial hip joint: Secondary | ICD-10-CM | POA: Diagnosis not present

## 2013-07-29 DIAGNOSIS — Z96649 Presence of unspecified artificial hip joint: Secondary | ICD-10-CM | POA: Diagnosis not present

## 2013-07-29 DIAGNOSIS — M25559 Pain in unspecified hip: Secondary | ICD-10-CM | POA: Diagnosis not present

## 2013-08-03 DIAGNOSIS — M25559 Pain in unspecified hip: Secondary | ICD-10-CM | POA: Diagnosis not present

## 2013-08-03 DIAGNOSIS — Z96649 Presence of unspecified artificial hip joint: Secondary | ICD-10-CM | POA: Diagnosis not present

## 2013-08-05 DIAGNOSIS — M25559 Pain in unspecified hip: Secondary | ICD-10-CM | POA: Diagnosis not present

## 2013-08-05 DIAGNOSIS — Z96649 Presence of unspecified artificial hip joint: Secondary | ICD-10-CM | POA: Diagnosis not present

## 2013-08-09 DIAGNOSIS — M25559 Pain in unspecified hip: Secondary | ICD-10-CM | POA: Diagnosis not present

## 2013-08-09 DIAGNOSIS — Z96649 Presence of unspecified artificial hip joint: Secondary | ICD-10-CM | POA: Diagnosis not present

## 2013-08-10 DIAGNOSIS — H269 Unspecified cataract: Secondary | ICD-10-CM | POA: Diagnosis not present

## 2013-08-10 DIAGNOSIS — H2510 Age-related nuclear cataract, unspecified eye: Secondary | ICD-10-CM | POA: Diagnosis not present

## 2013-08-17 DIAGNOSIS — Z96649 Presence of unspecified artificial hip joint: Secondary | ICD-10-CM | POA: Diagnosis not present

## 2013-08-17 DIAGNOSIS — M25559 Pain in unspecified hip: Secondary | ICD-10-CM | POA: Diagnosis not present

## 2013-08-19 DIAGNOSIS — M25559 Pain in unspecified hip: Secondary | ICD-10-CM | POA: Diagnosis not present

## 2013-08-19 DIAGNOSIS — Z96649 Presence of unspecified artificial hip joint: Secondary | ICD-10-CM | POA: Diagnosis not present

## 2013-08-23 DIAGNOSIS — Z96649 Presence of unspecified artificial hip joint: Secondary | ICD-10-CM | POA: Diagnosis not present

## 2013-08-23 DIAGNOSIS — M25559 Pain in unspecified hip: Secondary | ICD-10-CM | POA: Diagnosis not present

## 2013-08-25 DIAGNOSIS — M25559 Pain in unspecified hip: Secondary | ICD-10-CM | POA: Diagnosis not present

## 2013-08-25 DIAGNOSIS — Z96649 Presence of unspecified artificial hip joint: Secondary | ICD-10-CM | POA: Diagnosis not present

## 2013-08-31 DIAGNOSIS — H25019 Cortical age-related cataract, unspecified eye: Secondary | ICD-10-CM | POA: Diagnosis not present

## 2013-08-31 DIAGNOSIS — M25559 Pain in unspecified hip: Secondary | ICD-10-CM | POA: Diagnosis not present

## 2013-08-31 DIAGNOSIS — Z96649 Presence of unspecified artificial hip joint: Secondary | ICD-10-CM | POA: Diagnosis not present

## 2013-08-31 DIAGNOSIS — H2510 Age-related nuclear cataract, unspecified eye: Secondary | ICD-10-CM | POA: Diagnosis not present

## 2013-09-02 DIAGNOSIS — M25559 Pain in unspecified hip: Secondary | ICD-10-CM | POA: Diagnosis not present

## 2013-09-07 DIAGNOSIS — H269 Unspecified cataract: Secondary | ICD-10-CM | POA: Diagnosis not present

## 2013-09-07 DIAGNOSIS — H2510 Age-related nuclear cataract, unspecified eye: Secondary | ICD-10-CM | POA: Diagnosis not present

## 2013-10-27 ENCOUNTER — Encounter: Payer: Self-pay | Admitting: Internal Medicine

## 2013-10-31 DIAGNOSIS — J40 Bronchitis, not specified as acute or chronic: Secondary | ICD-10-CM | POA: Diagnosis not present

## 2013-10-31 DIAGNOSIS — J069 Acute upper respiratory infection, unspecified: Secondary | ICD-10-CM | POA: Diagnosis not present

## 2013-11-04 ENCOUNTER — Telehealth: Payer: Self-pay | Admitting: Internal Medicine

## 2013-11-04 NOTE — Telephone Encounter (Signed)
[facility] Triage Call Report Triage Record Num: [id number] Operator: Feliberto Harts Patient Name: Corey Fischer Call Date & Time: 10/31/2013 11:41:15AM Patient Phone: [phone] PCP: Benay Pillow Patient Gender: Male PCP Fax : [phone] Patient DOB: [birth year] Practice Name: [facility] Reason for Call: Caller: Lloy/Patient; PCP: Benay Pillow (Adults only, leaving end of July 2015); CB#: [phone]; Call regarding Cough/Congestion; Began with symptoms 10/30/2013. Afebrile. Has a wet cough and clear nasal drainage. Scratchy throat. Coughs a few times and hour. Using Robitussin CF cold and cough and does not think it helps. Provide Home Care per New onset of following s x s: Nasal congestion, runny nose, sneezing, itchy or mild sore throat. May also have cough. care advice given and voices understanding. Protocol(s) Used: Upper Respiratory Infection (URI) Recommended Outcome per Protocol: Provide Home/Self Care Reason for Outcome: New onset of the following symptoms: nasal congestion; runny nose; sneezing; itchy or mild sore throat. May also have cough; irritated eyes or a mild headache or a low grade fever up to 101.5 F (38.6C). Care Advice: ~ Use a cool mist humidifier to moisten air. Be sure to clean according to manufacturer's instructions. ~ Call provider if symptoms worsen or new symptoms develop. ~ Consider use of a saline nasal spray per package directions to help relieve nasal congestion. Drink more fluids -- water, low-sugar juices, tea and warm soup, especially chicken broth, are options. Avoid caffeinated or alcoholic beverages because they can increase the chance of dehydration. ~ Mild symptoms of a cold can be expected to last 7 to 10 days. Sometimes, a cough associated with a cold can last up to 3 weeks. Over-the-counter cold medications may temporarily relieve the symptoms, but do not shorten the length of the cold. ~ A warm, moist compress placed on face,  over eyes for 15 to 20 minutes, 5 to 6 times a day, may help relieve the congestion. ~ Sore Throat Relief: - Use salt water gargles (1/2 teaspoon salt in 8 oz. [228mL] warm water) every one to two hours. - Use a vaporizer or cool mist humidifier in the room when sleeping. - Suck on hard candy, nonprescription or herbal throat lozenges (sugar-free if diabetic) - Eat soothing, soft food/fluids (broths, soups, or honey and lemon juice in hot tea, Popsicles, frozen yogurt or sherbet, scrambled eggs, cooked cereals, Jell-O or puddings) whichever is most comforting. - Avoid eating salty, spicy or acidic foods. ~ Total water intake includes drinking water, water in beverages, and water contained in food. Fluids make up about 80% of the body's total hydration need. Individual fluid requirement to maintain hydration vary based on physical activity, environmental factors and illness. Limit fluids that contain sugar, caffeine, or alcohol. Urine will be very light yellow color when you drink enough fluids. ~

## 2013-11-10 ENCOUNTER — Ambulatory Visit: Payer: Medicare Other | Admitting: Internal Medicine

## 2013-11-23 ENCOUNTER — Telehealth (HOSPITAL_COMMUNITY): Payer: Self-pay | Admitting: *Deleted

## 2013-11-23 ENCOUNTER — Telehealth: Payer: Self-pay | Admitting: Cardiovascular Disease

## 2013-11-23 NOTE — Telephone Encounter (Signed)
Closed encounter °

## 2013-11-30 ENCOUNTER — Ambulatory Visit (HOSPITAL_COMMUNITY)
Admission: RE | Admit: 2013-11-30 | Discharge: 2013-11-30 | Disposition: A | Discharge status: Home / Self Care | Payer: Medicare Other | Source: Ambulatory Visit | Attending: Cardiovascular Disease | Admitting: Cardiovascular Disease

## 2013-11-30 DIAGNOSIS — I6529 Occlusion and stenosis of unspecified carotid artery: Secondary | ICD-10-CM | POA: Diagnosis not present

## 2013-11-30 DIAGNOSIS — M76899 Other specified enthesopathies of unspecified lower limb, excluding foot: Secondary | ICD-10-CM | POA: Diagnosis not present

## 2013-11-30 DIAGNOSIS — I6521 Occlusion and stenosis of right carotid artery: Secondary | ICD-10-CM

## 2013-11-30 NOTE — Progress Notes (Signed)
Carotid Duplex Completed. Carolanne Mercier, BS, RDMS, RVT  

## 2013-12-10 ENCOUNTER — Telehealth: Payer: Self-pay | Admitting: *Deleted

## 2013-12-10 DIAGNOSIS — I6529 Occlusion and stenosis of unspecified carotid artery: Secondary | ICD-10-CM

## 2013-12-10 NOTE — Telephone Encounter (Signed)
Order placed for repeat carotid dopplers in 1 year  

## 2013-12-10 NOTE — Telephone Encounter (Signed)
Message copied by Chauncy Lean on Fri Dec 10, 2013  9:53 AM ------      Message from: [name]      Created: Thu Dec 09, 2013  1:23 PM       No change from prior study. Repeat in 12 months. ------

## 2013-12-23 DIAGNOSIS — M76899 Other specified enthesopathies of unspecified lower limb, excluding foot: Secondary | ICD-10-CM | POA: Diagnosis not present

## 2013-12-29 DIAGNOSIS — M25559 Pain in unspecified hip: Secondary | ICD-10-CM | POA: Diagnosis not present

## 2013-12-29 DIAGNOSIS — M76899 Other specified enthesopathies of unspecified lower limb, excluding foot: Secondary | ICD-10-CM | POA: Diagnosis not present

## 2013-12-30 ENCOUNTER — Encounter: Payer: Self-pay | Admitting: Family Medicine

## 2013-12-30 ENCOUNTER — Ambulatory Visit (INDEPENDENT_AMBULATORY_CARE_PROVIDER_SITE_OTHER): Payer: Medicare Other | Admitting: Family Medicine

## 2013-12-30 VITALS — BP 140/60 | HR 60 | Temp 97.7°F | Ht 68.0 in | Wt 149.0 lb

## 2013-12-30 DIAGNOSIS — R972 Elevated prostate specific antigen [PSA]: Secondary | ICD-10-CM | POA: Diagnosis not present

## 2013-12-30 DIAGNOSIS — F411 Generalized anxiety disorder: Secondary | ICD-10-CM

## 2013-12-30 DIAGNOSIS — E538 Deficiency of other specified B group vitamins: Secondary | ICD-10-CM

## 2013-12-30 DIAGNOSIS — D473 Essential (hemorrhagic) thrombocythemia: Secondary | ICD-10-CM | POA: Diagnosis not present

## 2013-12-30 NOTE — Assessment & Plan Note (Signed)
Idiopathic. Had been followed by hematology. No longer following. Asymptomatic.

## 2013-12-30 NOTE — Assessment & Plan Note (Signed)
Asymptomatic. We'll recheck labs in 6 months. Attempted to check in one week but the patient with have to sign a waiver that he would have to pay cost potentially and he declined.

## 2013-12-30 NOTE — Assessment & Plan Note (Signed)
Sparing use of Valium with about 10-20 tablets over to 3 years. Discussed with patient I did not prescribe chronic daily benzodiazepines. We will continue as long as usage does not increase

## 2013-12-30 NOTE — Patient Instructions (Signed)
Wonderful to meet you Prostate smooth today. Repeat rectal exam and PSA in 6 months.  Vitamin B12-check in 6 months (may have to sign waiver for cost on this and PSA) Lesion on ear-let's keep an eye on this Mole on back-please let me know if it grows or changes. It is less than 1 cm at this time.

## 2013-12-30 NOTE — Assessment & Plan Note (Signed)
Patient's concern primarily stems from a PSA that increased from 1.4-4.5 over 2 years. 4.5 to be within normal range for his age. He had level rechecked in March and was down to 3.69. Digital rectal exam today with smooth but enlarged prostate. He opts to recheck PSA in 6 months.

## 2013-12-30 NOTE — Progress Notes (Signed)
Corey Reddish, MD Phone: [phone]  Subjective:  Patient presents today to establish care. Chief complaint-noted.   PSA elevation Had biopsy in 01 when got up to 4. History of TURP due to BPH.December 2012 with PSA 1.4, Dec 2014 with PSA 4.49, March 2015 3.69. Normal digital exam.  Ros-denies hematuria. Denies worsening symptoms of BPH.  Vitamin B12 deficiency Had been up to 2x injection per month due to oral not absorbing well on PPI. Used inhalational model but became too expensive. Changed to sublingual.  ROS- no weakness or paresthesias (other than cold feet and hands which has been lifelong issue)  Thrombocytopenia Followed by hematology x 1 year and levels stable  ROS- no easy bruising or bleeding  Anxiety Uses 10-20 over 2-3 years. No alcohol when using. Also uses for BPPV.   Skin concerns Lesion on ear- no AK. Presently tear Lesion on back - 1 cm flesh colored mole. No recent changes. Likely at least a year.  ROS-Goes to [facility] at [facility]. Has IT band issues and ? Bursitis. Following currently. Otherwise see history of present illness   The following were reviewed and entered/updated in epic: Past Medical History  Diagnosis Date  . Chronic ITP (idiopathic thrombocytopenia)   . Hx of adenomatous colonic polyps 2010  . Diverticulosis   . GERD (gastroesophageal reflux disease)   . Allergy   . Hyperlipidemia   . Hypertension   . Elevated PSA   . Anxiety   . Depression   . Stricture and stenosis of esophagus 2010    last endoscopy- 2010  . Gastritis   . Hemorrhoids   . History of hiatal hernia   . Raynaud's syndrome   . Gastric polyp   . Asymptomatic bilateral carotid artery stenosis per duplex 05-26-2007--  bil. ica 40-59%  . Constipation   . Head cold   . Arthritis   . Osteoarthritis hips  . BPH (benign prostatic hyperplasia)   . Nocturia   . B12 deficiency   . Palpitations     monitor 05/2012- NSR with symptomatic PVC; echo 06-17-12-  EF 55-60%aoric sclerosis without stenosis,   . Dysrhythmia     PVC's  . History of stress test 11/2012    pt. cleared for surgery based on good stress test result    . H/O hiatal hernia   . DIVERTICULOSIS-COLON 03/16/2010    Qualifier: Diagnosis of  By: Shane Crutch, Amy S    Patient Active Problem List   Diagnosis Date Noted  . 2Nd degree AV block- Mobitz1 12/10/2012    Priority: High  . PSA elevation 12/30/2013    Priority: Medium  . PVC's (premature ventricular contractions) 12/10/2012    Priority: Medium  . Bradycardia 12/10/2012    Priority: Medium  . PAC (premature atrial contraction), symptomatic with palpatataions 10/16/2012    Priority: Medium  . THROMBOCYTHEMIA 04/21/2008    Priority: Medium  . CAROTID ARTERY STENOSIS, BILATERAL 11/05/2007    Priority: Medium  . HYPERTENSION 04/29/2007    Priority: Medium  . VITAMIN B12 DEFICIENCY 01/26/2007    Priority: Medium  . HYPERLIPIDEMIA 01/22/2007    Priority: Medium  . Anxiety state, unspecified 12/30/2013    Priority: Low  . Hip arthritis 05/31/2013    Priority: Low  . BPH (benign prostatic hyperplasia) 07/22/2011    Priority: Low  . OSTEOARTHRITIS 03/16/2010    Priority: Low  . Idiopathic osteoporosis 07/06/2009    Priority: Low  . Raynaud's syndrome 06/02/2009    Priority: Low  .  PROSTATITIS, RECURRENT 03/01/2009    Priority: Low  . CONSTIPATION, CHRONIC 07/28/2008    Priority: Low  . ESOPHAGEAL STRICTURE 03/04/2008    Priority: Low  . COLONIC POLYPS, HX OF 03/04/2008    Priority: Low  . Unspecified visual loss 10/30/2007    Priority: Low  . INTERNAL HEMORRHOIDS WITHOUT MENTION COMP 08/14/2007    Priority: Low  . GERD 01/22/2007    Priority: Low   Past Surgical History  Procedure Laterality Date  . Rotator cuff repair  1987    RIGHT  . Tonsillectomy  age 9  . Appendectomy  age 72  . Laparoscopic inguinal hernia repair  09-19-2010    BILATERAL  . Left shoulder surg.  2006  . Cholecystectomy   1989  . Carotid duplex  05-26-2007    BILATERAL ICA  40-59%  . Transurethral resection of prostate  07/22/2011    Procedure: TRANSURETHRAL RESECTION OF THE PROSTATE WITH GYRUS INSTRUMENTS;  Surgeon: Bernestine Amass, MD;  Location: [facility];  Service: Urology;  Laterality: N/A;  Saline Gyrus TURP   . Cardiac catheterization  1989    Told that he is "pristine "  . Total hip arthroplasty Left 06/01/2013    DR Mayer Camel  . Total hip arthroplasty Left 05/31/2013    Procedure: TOTAL HIP ARTHROPLASTY;  Surgeon: Kerin Salen, MD;  Location: [facility];  Service: Orthopedics;  Laterality: Left;    Family History  Problem Relation Age of Onset  . Heart disease Mother   . Heart disease Father     angina  . Colon cancer Neg Hx   . Colon polyps Mother   . Cancer Maternal Grandmother   . Heart failure Maternal Grandfather     Medications- reviewed and updated Current Outpatient Prescriptions  Medication Sig Dispense Refill  . amLODipine (NORVASC) 2.5 MG tablet Take 2.5 mg by mouth at bedtime.       . Ascorbic Acid (VITAMIN C) 1000 MG tablet Take 1,000 mg by mouth daily.       [name] aspirin 81 MG tablet Take 81 mg by mouth daily.      . Calcium 500 MG CHEW Chew 500 mg by mouth daily.      . cetirizine (ZYRTEC) 10 MG tablet Take 10 mg by mouth daily with breakfast.       . Cholecalciferol (VITAMIN D3) 1000 UNITS CAPS Take 2,000 Units by mouth daily.       . Coenzyme Q10 50 MG CAPS Take 50 mg by mouth daily.       . Cyanocobalamin 2500 MCG SUBL Place 2,500 mcg under the tongue daily.       . Magnesium 200 MG TABS Take 200 mg by mouth 2 (two) times daily.      . polyethylene glycol (MIRALAX / GLYCOLAX) packet Take 17 g by mouth daily.       . RABEprazole (ACIPHEX) 20 MG tablet Take 20 mg by mouth at bedtime.       . simvastatin (ZOCOR) 20 MG tablet Take 20 mg by mouth daily at 6 PM.       . Zinc 25 MG TABS Take 25 mg by mouth daily.       . diazepam (VALIUM) 5 MG tablet Take 2.5-5 mg by  mouth at bedtime as needed for anxiety or sedation.       No current facility-administered medications for this visit.    Allergies-reviewed and updated Allergies  Allergen Reactions  . Ciprofloxacin  History   Social History  . Marital Status: Married    Spouse Name: N/A    Number of Children: 2  . Years of Education: N/A   Occupational History  . retired    Social History Main Topics  . Smoking status: Never Smoker   . Smokeless tobacco: Never Used  . Alcohol Use: Yes     Comment: occasionally  . Drug Use: No  . Sexual Activity: Yes   Other Topics Concern  . None   Social History Narrative   Grew up in [place] town in [place]. 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 Met wife in [place]. At 77, got married and went to college-undergrad in [employer] as well as Oceanographer, doctorate in Location manager.  Worked at [employer] which eventually became [employer]. Lived in [place] and [place] before eventually moving to [place]. 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       Married over 50 years. 44 years 01/2014. 2 married children with 2 children each. 1 son near [place]. 1 son in [place] with house of rep.       Hobbies: work out most mornings 5 days a week (spin, elipitical), racquetball until 2014 after hip surgery.    ROS--See HPI   Objective: BP 140/60  Pulse 60  Temp(Src) 97.7 F (36.5 C)  Ht 5' 8"  (1.727 m)  Wt 149 lb (67.586 kg)  BMI 22.66 kg/m2 Gen: NAD, resting comfortably on table, appears younger than stated age HEENT: Mucous membranes are moist. Oropharynx normal Neck: no thyromegaly CV: RRR no murmurs rubs or gallops Lungs: CTAB no crackles, wheeze, rhonchi Abdomen: soft/nontender/nondistended/normal bowel sounds. No rebound or guarding.  Ext: no edema Skin: warm, dry, patient points to area of scale on right upper ear I do not see any erythema or scaling in this area there was one small flake which was easily wiped away.  On left upper back has a less than 1 cm ny less than 1 cm flesh-colored raised lesion appears to be a mole. Neuro: grossly normal, moves all extremities, PERRLA  Assessment/Plan:  PSA elevation Patient's concern primarily stems from a PSA that increased from 1.4-4.5 over 2 years. 4.5 to be within normal range for his age. He had level rechecked in March and was down to 3.69. Digital rectal exam today with smooth but enlarged prostate. He opts to recheck PSA in 6 months.  VITAMIN B12 DEFICIENCY Asymptomatic. We'll recheck labs in 6 months. Attempted to check in one week but the patient with have to sign a waiver that he would have to pay cost potentially and he declined.  THROMBOCYTHEMIA Idiopathic. Had been followed by hematology. No longer following. Asymptomatic.  Anxiety state, unspecified Sparing use of Valium with about 10-20 tablets over to 3 years. Discussed with patient I did not prescribe chronic daily benzodiazepines. We will continue as long as usage does not increase   followup in 6 months. Continue to follow spot on right ear and left upper back Labs at next visit: CBC CMET lipid panel PSA vitamin B 12.

## 2013-12-31 DIAGNOSIS — M25559 Pain in unspecified hip: Secondary | ICD-10-CM | POA: Diagnosis not present

## 2013-12-31 DIAGNOSIS — M76899 Other specified enthesopathies of unspecified lower limb, excluding foot: Secondary | ICD-10-CM | POA: Diagnosis not present

## 2014-01-04 DIAGNOSIS — M25559 Pain in unspecified hip: Secondary | ICD-10-CM | POA: Diagnosis not present

## 2014-01-04 DIAGNOSIS — M76899 Other specified enthesopathies of unspecified lower limb, excluding foot: Secondary | ICD-10-CM | POA: Diagnosis not present

## 2014-01-05 ENCOUNTER — Encounter: Payer: Self-pay | Admitting: Family Medicine

## 2014-01-06 ENCOUNTER — Telehealth: Payer: Self-pay

## 2014-01-06 MED ORDER — SIMVASTATIN 20 MG PO TABS: 20.0000 mg | ORAL_TABLET | Freq: Every day | ORAL | Status: DC

## 2014-01-06 NOTE — Telephone Encounter (Signed)
Error

## 2014-01-07 DIAGNOSIS — M76899 Other specified enthesopathies of unspecified lower limb, excluding foot: Secondary | ICD-10-CM | POA: Diagnosis not present

## 2014-01-07 DIAGNOSIS — M25559 Pain in unspecified hip: Secondary | ICD-10-CM | POA: Diagnosis not present

## 2014-01-19 DIAGNOSIS — H35379 Puckering of macula, unspecified eye: Secondary | ICD-10-CM | POA: Diagnosis not present

## 2014-01-19 DIAGNOSIS — Z961 Presence of intraocular lens: Secondary | ICD-10-CM | POA: Diagnosis not present

## 2014-01-19 DIAGNOSIS — H40019 Open angle with borderline findings, low risk, unspecified eye: Secondary | ICD-10-CM | POA: Diagnosis not present

## 2014-01-19 DIAGNOSIS — H26499 Other secondary cataract, unspecified eye: Secondary | ICD-10-CM | POA: Diagnosis not present

## 2014-01-27 DIAGNOSIS — M76899 Other specified enthesopathies of unspecified lower limb, excluding foot: Secondary | ICD-10-CM | POA: Diagnosis not present

## 2014-02-23 ENCOUNTER — Ambulatory Visit: Payer: Medicare Other | Admitting: Cardiovascular Disease

## 2014-02-23 DIAGNOSIS — R972 Elevated prostate specific antigen [PSA]: Secondary | ICD-10-CM | POA: Diagnosis not present

## 2014-02-24 ENCOUNTER — Ambulatory Visit (INDEPENDENT_AMBULATORY_CARE_PROVIDER_SITE_OTHER): Payer: Medicare Other | Admitting: Family Medicine

## 2014-02-24 DIAGNOSIS — Z23 Encounter for immunization: Secondary | ICD-10-CM | POA: Diagnosis not present

## 2014-02-24 DIAGNOSIS — M7062 Trochanteric bursitis, left hip: Secondary | ICD-10-CM | POA: Diagnosis not present

## 2014-02-25 DIAGNOSIS — D239 Other benign neoplasm of skin, unspecified: Secondary | ICD-10-CM | POA: Diagnosis not present

## 2014-02-25 DIAGNOSIS — L821 Other seborrheic keratosis: Secondary | ICD-10-CM | POA: Diagnosis not present

## 2014-02-25 DIAGNOSIS — L570 Actinic keratosis: Secondary | ICD-10-CM | POA: Diagnosis not present

## 2014-02-25 DIAGNOSIS — M67441 Ganglion, right hand: Secondary | ICD-10-CM | POA: Diagnosis not present

## 2014-02-25 DIAGNOSIS — D1801 Hemangioma of skin and subcutaneous tissue: Secondary | ICD-10-CM | POA: Diagnosis not present

## 2014-02-25 DIAGNOSIS — B353 Tinea pedis: Secondary | ICD-10-CM | POA: Diagnosis not present

## 2014-03-07 ENCOUNTER — Ambulatory Visit (INDEPENDENT_AMBULATORY_CARE_PROVIDER_SITE_OTHER): Payer: Medicare Other | Admitting: Physician Assistant

## 2014-03-07 ENCOUNTER — Encounter: Payer: Self-pay | Admitting: Physician Assistant

## 2014-03-07 ENCOUNTER — Telehealth: Payer: Self-pay | Admitting: Cardiovascular Disease

## 2014-03-07 VITALS — BP 140/62 | HR 69 | Ht 68.0 in | Wt 155.0 lb

## 2014-03-07 DIAGNOSIS — I499 Cardiac arrhythmia, unspecified: Secondary | ICD-10-CM

## 2014-03-07 DIAGNOSIS — I10 Essential (primary) hypertension: Secondary | ICD-10-CM | POA: Diagnosis not present

## 2014-03-07 DIAGNOSIS — I493 Ventricular premature depolarization: Secondary | ICD-10-CM

## 2014-03-07 DIAGNOSIS — R001 Bradycardia, unspecified: Secondary | ICD-10-CM | POA: Diagnosis not present

## 2014-03-07 DIAGNOSIS — I6523 Occlusion and stenosis of bilateral carotid arteries: Secondary | ICD-10-CM | POA: Diagnosis not present

## 2014-03-07 DIAGNOSIS — E785 Hyperlipidemia, unspecified: Secondary | ICD-10-CM

## 2014-03-07 DIAGNOSIS — I6529 Occlusion and stenosis of unspecified carotid artery: Secondary | ICD-10-CM | POA: Diagnosis not present

## 2014-03-07 DIAGNOSIS — I498 Other specified cardiac arrhythmias: Secondary | ICD-10-CM

## 2014-03-07 DIAGNOSIS — I491 Atrial premature depolarization: Secondary | ICD-10-CM

## 2014-03-07 LAB — CBC
HEMATOCRIT: 45.5 % (ref 39.0–52.0)
Hemoglobin: 15.2 g/dL (ref 13.0–17.0)
MCHC: 33.5 g/dL (ref 30.0–36.0)
MCV: 89.1 fl (ref 78.0–100.0)
Platelets: 157 10*3/uL (ref 150.0–400.0)
RBC: 5.1 Mil/uL (ref 4.22–5.81)
RDW: 13.5 % (ref 11.5–15.5)
WBC: 9.1 10*3/uL (ref 4.0–10.5)

## 2014-03-07 LAB — BASIC METABOLIC PANEL
BUN: 23 mg/dL (ref 6–23)
CALCIUM: 9.3 mg/dL (ref 8.4–10.5)
CO2: 29 mEq/L (ref 19–32)
CREATININE: 1 mg/dL (ref 0.4–1.5)
Chloride: 102 mEq/L (ref 96–112)
GFR: 73.02 mL/min (ref >60.00)
Glucose, Bld: 102 mg/dL — ABNORMAL HIGH (ref 70–99)
Potassium: 4.4 mEq/L (ref 3.5–5.1)
Sodium: 139 mEq/L (ref 135–145)

## 2014-03-07 LAB — TSH: TSH: 1.1 u[IU]/mL (ref 0.35–4.50)

## 2014-03-07 LAB — MAGNESIUM: MAGNESIUM: 2 mg/dL (ref 1.5–2.5)

## 2014-03-07 MED ORDER — METOPROLOL TARTRATE 25 MG PO TABS: 12.5000 mg | ORAL_TABLET | Freq: Two times a day (BID) | ORAL | Status: DC

## 2014-03-07 NOTE — Progress Notes (Signed)
[facility], [address] [place], [place]  [address] Phone: [phone] Fax:  [phone]  Date:  03/07/2014   Patient ID:  Corey Fischer, Corey Fischer Oct 14, 1931, MRN [id number]   PCP:  Garret Reddish, MD  Cardiologist:  Dr. Gwenlyn Found   History of Present Illness: Corey Fischer is a 80 y.o. male with history of HLD, HTN, carotid artery disease, PACs, PVCs who presents to clinic for palpitations.  2D Echo 05/2012: mild LVH, EF 55-60%, no RWMA, aortic sclerosis without stenosis, mildly thickened MV, mod TR. Event monitor 11/2012: NSR/SB with 1st degree AVB and PACs/atrial bigeminy. He was placed on low dose diltiazem in 2014 with subjective worsening of symptoms. EKG 11/2012 was interpreted as Mobitz I AV block so dilitazem was discontinued (however, see below - later felt not to represent AV block). He had a normal nuc in 12/2012. He has been on amlodipine since then which he felt had improved his symptoms.  However, over the past few weeks, he has been more symptomatic with his palpitations. They make him feel fatigued. They feel like an irregular heartbeat and are fairly persistent. When he exercises on the elliptical he actually feels better since the irregularity goes away. He felt them during his EKG today. He denies any recent concerning chest pain, dyspnea, syncope, near syncope, LEE, orthopnea, weight loss or bleeding.  Recent Labs: 05/07/2013: HDL Cholesterol by NMR 43.50; LDL (calc) 74; TSH 1.24  05/30/2013: ALT 18  06/01/2013: Creatinine 1.04; Potassium 4.1  06/02/2013: Hemoglobin 12.2*   Wt Readings from Last 3 Encounters:  03/07/14 155 lb (70.308 kg)  12/30/13 149 lb (67.586 kg)  05/30/13 154 lb (69.854 kg)     Past Medical History  Diagnosis Date  . Chronic ITP (idiopathic thrombocytopenia)   . Hx of adenomatous colonic polyps 2010  . Diverticulosis   . GERD (gastroesophageal reflux disease)   . Allergy   . Hyperlipidemia   . Hypertension   . Elevated PSA   . Anxiety   .  Depression   . Stricture and stenosis of esophagus 2010    last endoscopy- 2010  . Gastritis   . Hemorrhoids   . History of hiatal hernia   . Raynaud's syndrome   . Gastric polyp   . Asymptomatic bilateral carotid artery stenosis     a. Last duplex 11/2013 R bulb and ICA - 50-69%, R ECA >50%. L bulb and prox ICA: 0-49%. Repeat due 11/2014.  . Arthritis   . Osteoarthritis hips  . BPH (benign prostatic hyperplasia)   . B12 deficiency   . Palpitations   . Symptomatic PVCs     a. Per report, monitor 05/2012 - NSR with symptomatic PVC. b. Echo 05/2012- EF 55-60%, aortic sclerosis without stenosis. c. Nuc 12/2012 - normal.  . H/O hiatal hernia   . Second degree Mobitz I AV block     a. Identified on EKG 11/2012, diltiazem stopped at that time.  [name] PAC (premature atrial contraction)     a. NSR/SB with 1st degree AVB and PACs/atrial bigeminy seen on event monitor 11/2012.  . Sinus bradycardia     Current Outpatient Prescriptions  Medication Sig Dispense Refill  . amLODipine (NORVASC) 2.5 MG tablet Take 2.5 mg by mouth at bedtime.       . Ascorbic Acid (VITAMIN C) 1000 MG tablet Take 1,000 mg by mouth daily.       [name] aspirin 81 MG tablet Take 81 mg by mouth daily.      [name]  Calcium 500 MG CHEW Chew 500 mg by mouth daily.      . cetirizine (ZYRTEC) 10 MG tablet Take 10 mg by mouth daily with breakfast.       . Cholecalciferol (VITAMIN D3) 1000 UNITS CAPS Take 2,000 Units by mouth daily.       . Coenzyme Q10 50 MG CAPS Take 50 mg by mouth daily.       . Cyanocobalamin 2500 MCG SUBL Place 2,500 mcg under the tongue daily.       . diazepam (VALIUM) 5 MG tablet Take 2.5-5 mg by mouth at bedtime as needed for anxiety or sedation.      . Magnesium 200 MG TABS Take 200 mg by mouth 2 (two) times daily.      . polyethylene glycol (MIRALAX / GLYCOLAX) packet Take 17 g by mouth daily.       . RABEprazole (ACIPHEX) 20 MG tablet Take 20 mg by mouth at bedtime.       . simvastatin (ZOCOR) 20 MG tablet Take 1  tablet (20 mg total) by mouth daily at 6 PM.  90 tablet  1  . Zinc 25 MG TABS Take 25 mg by mouth daily.        No current facility-administered medications for this visit.    Allergies:   Ciprofloxacin   Social History:  The patient  reports that he has never smoked. He has never used smokeless tobacco. He reports that he drinks alcohol. He reports that he does not use illicit drugs.   Family History:  The patient's family history includes Angina in his father; Cancer in his maternal grandmother; Colon polyps in his mother; Heart disease in his father and mother; Heart failure in his maternal grandfather. There is no history of Colon cancer.   ROS:  Please see the history of present illness.  All other systems reviewed and negative.   PHYSICAL EXAM:  VS:  BP 140/62  Pulse 69  Ht 5\' 8"  (1.727 m)  Wt 155 lb (70.308 kg)  BMI 23.57 kg/m2 Well nourished, well developed WM, in no acute distress HEENT: normal Neck: no JVD Cardiac:  normal S1, S2; RRR; no murmur Lungs:  clear to auscultation bilaterally, no wheezing, rhonchi or rales Abd: soft, nontender, no hepatomegaly Ext: no edema, 2+ pedal pulses Skin: warm and dry Neuro:  moves all extremities spontaneously, no focal abnormalities noted  EKG:  Irregular 69bpm - suspected wandering atrial pacemaker with PAC  ASSESSMENT AND PLAN:  1. Palpitations - EKG today shows what appears to be wandering atrial pacemaker with possible PACs. I reviewed with Dr. Mare Ferrari. We do not feel that his 11/2013 EKG represented Mobitz I AV block. This opens back up the ability to re-try AV nodal blocking agents. He felt worse on diltiazem. I will try him on low dose metoprolol at 12.5mg  BID. If palpitations do not improve on metoprolol, I would consider titrating dose versus trying him on a different beta blocker until we find one that works for him. He has baseline sinus bradycardia so I suspect we won't have much, if any, room to titrate. I have asked him  to monitor his HR at home. He will call us in a week with an update if symptoms have not improved. Will also check BMET, Mg, given the electrolytes he takes as well as CBC to exclude occult cause of his fatigue. 2. PVCs/PACs - as above. 3. HTN - continue amlodipine. Adding low dose metoprolol. Follow. 4. Hyperlipidemia -  continue statin. 5. Carotid artery disease - Dr. Gwenlyn Found plans f/u 11/2014 per notes.  Dispo: F/u as scheduled in several weeks with Dr. Gwenlyn Found.  Signed, [name], PA-C  03/07/2014 3:09 PM

## 2014-03-07 NOTE — Telephone Encounter (Signed)
Returned call to patient he stated he had very irregular heart beat all day yesterday.Stated heart did not beat fast just very irregular.Stated he feels bad this morning and heart starting to beat irregular again.Stated he would like to be seen today.Appointment scheduled with Sharrell Ku PA at our [facility] clinic today at 2:30 pm.

## 2014-03-07 NOTE — Telephone Encounter (Signed)
Pt called in stating that he has been having some arrhythmia issues since yesterday and would like to be seen. Please call  Thanks

## 2014-03-07 NOTE — Patient Instructions (Signed)
Please start Metoprolol tartrate 25 mg - take 1/2 tablet twice a day. Continue all other medications as listed.  Please have blood work today (CBC, BMP, TSH and Mg level)  Follow up as scheduled with Dr Gwenlyn Found.

## 2014-03-08 ENCOUNTER — Telehealth: Payer: Self-pay | Admitting: *Deleted

## 2014-03-08 NOTE — Telephone Encounter (Signed)
lvm with woman to have ptcb for results

## 2014-03-08 NOTE — Telephone Encounter (Signed)
pt notified about lab results with verbal understanding  

## 2014-03-10 ENCOUNTER — Telehealth: Payer: Self-pay | Admitting: Cardiovascular Disease

## 2014-03-10 NOTE — Telephone Encounter (Signed)
Will leave to Dr Gwenlyn Found. Kirk Ruths

## 2014-03-10 NOTE — Telephone Encounter (Signed)
Returned call to patient. Patient saw Corey Copa, PA on 10/19 for palpitations (described as skipped beats, extra beats). He reports that they are NOT constant and come and go but cannot associate the palpitations with any particular activity (with exertion/exericse and at rest). He reports they started back today around 1am and have persisted all day. He reports his BP is around 120s/60s and his HR is running 48-50 (normal HR prior to starting metoprolol tartrate 12.5mg  BID was in mid 50s)  Patient does not feel that beta blocker is helping his palpitations, but her Dayna, PA's note, there may not be much room to tirtrate.   Will defer to Corey Copa, PA (as she requested an update) and DOD (Dr. Stanford Breed to advise) >> please send response to triage/clinical pool

## 2014-03-10 NOTE — Telephone Encounter (Signed)
New message      Pt say Corey Fischer on Monday.  He is no better----still have a lot of heart arrhythmia.  Please call

## 2014-03-11 MED ORDER — ATENOLOL 25 MG PO TABS: 12.5000 mg | ORAL_TABLET | Freq: Every day | ORAL | Status: DC

## 2014-03-11 NOTE — Telephone Encounter (Signed)
Patient has an appt with Dr Gwenlyn Found on 11/3

## 2014-03-11 NOTE — Telephone Encounter (Signed)
If palpitations continue this AM, please d/c metoprolol and start atenolol 12.5mg  daily (1/2 tablet of a 25mg  dose). The dose may seem small, but it is the equivalent to the dose of metoprolol he was on and I don't want to drop his HR much further. Unfortunately his HR doesn't give [place] much room to work with. Please send in prescription for a 7 day supply with 1 refill so that he doesn't necessarily have to pay for a whole month's worth. Let us know if this does or does not help. Cheridan Kibler PA-C

## 2014-03-11 NOTE — Telephone Encounter (Signed)
I spoke with patient and notified him of the medication change.  He is agreeable to try atenolol and the RX was sent in.  He will call us back with any problems or increase in symptoms.

## 2014-03-11 NOTE — Telephone Encounter (Signed)
Hi Curt Bears, I was told to send my reply to the triage pool (since Julaine Hua is off today) but I am not sure if they acted on my recommendation because I don't see anyone sent anything in. I saw this guy in clinic and told him we would try him on a couple different beta blockers to see if we could find one that worked for him. Metoprolol didn't do the trick. See my note below about d/c'ing metoprolol and starting atenolol. Can you help me out with this? Thanks! Dayna Dunn PA-C

## 2014-03-11 NOTE — Telephone Encounter (Signed)
followup with mid-level provider in 4 weeks

## 2014-03-17 DIAGNOSIS — H43812 Vitreous degeneration, left eye: Secondary | ICD-10-CM | POA: Diagnosis not present

## 2014-03-17 DIAGNOSIS — G453 Amaurosis fugax: Secondary | ICD-10-CM | POA: Diagnosis not present

## 2014-03-21 ENCOUNTER — Encounter: Payer: Self-pay | Admitting: Family Medicine

## 2014-03-21 ENCOUNTER — Telehealth: Payer: Self-pay | Admitting: Cardiovascular Disease

## 2014-03-21 NOTE — Telephone Encounter (Signed)
Returned call to patient he stated he wanted to make sure he is to continue amlodipine 2.5 mg daily and atenolol 25 mg 1/2 tablet daily.Stated atenolol was prescribed recently for irregular heart beat.Stated his irregular heart beat has much improved.B/P ranging 120 to 140 / 60,pulse 52 bpm.Advised to continue as prescribed.Advised to bring a diary of B/P's to next visit with Dr.Berry.

## 2014-03-21 NOTE — Telephone Encounter (Signed)
Pt called inquiring about his medication Amlodipine. He would like to speak to the nurse about if he should continue to take this while having a slow heart rate. Please call  Thanks

## 2014-03-22 ENCOUNTER — Other Ambulatory Visit: Payer: Self-pay

## 2014-03-22 MED ORDER — AMLODIPINE BESYLATE 2.5 MG PO TABS: 2.5000 mg | ORAL_TABLET | Freq: Every day | ORAL | Status: DC

## 2014-03-24 DIAGNOSIS — M7062 Trochanteric bursitis, left hip: Secondary | ICD-10-CM | POA: Diagnosis not present

## 2014-03-28 ENCOUNTER — Ambulatory Visit (INDEPENDENT_AMBULATORY_CARE_PROVIDER_SITE_OTHER): Payer: Medicare Other | Admitting: Family Medicine

## 2014-03-28 ENCOUNTER — Ambulatory Visit (INDEPENDENT_AMBULATORY_CARE_PROVIDER_SITE_OTHER)
Admission: RE | Admit: 2014-03-28 | Discharge: 2014-03-28 | Disposition: A | Discharge status: Home / Self Care | Payer: Medicare Other | Source: Ambulatory Visit | Attending: Family Medicine | Admitting: Family Medicine

## 2014-03-28 ENCOUNTER — Encounter: Payer: Self-pay | Admitting: Family Medicine

## 2014-03-28 VITALS — BP 110/50 | HR 48 | Temp 97.9°F | Wt 156.0 lb

## 2014-03-28 DIAGNOSIS — R05 Cough: Secondary | ICD-10-CM

## 2014-03-28 DIAGNOSIS — J069 Acute upper respiratory infection, unspecified: Secondary | ICD-10-CM

## 2014-03-28 DIAGNOSIS — R059 Cough, unspecified: Secondary | ICD-10-CM

## 2014-03-28 DIAGNOSIS — I10 Essential (primary) hypertension: Secondary | ICD-10-CM | POA: Diagnosis not present

## 2014-03-28 NOTE — Progress Notes (Signed)
Garret Reddish, MD Phone: [phone]  Subjective:   Corey Fischer is a 80 y.o. year old very pleasant male patient who presents with the following:  Cough/congestion 2 weeks ago started with mild cough. Constantly clearing throat in the evening.  Able to sleep but next morning seems to be clearing throat/coughing in the AM. Abl eto exercise in day and feels ok so things seem decent in the daytime. Took tussionex for 3 days with some mild relief in cough-has not taken in 5 days. Lower energy. Taking cetirizine once a day.   Started on metoprolol in Mid October and then changed to atenolol-helped with arrhythmia.   ROS- no fever/chills/nausea/vomiting  Past Medical History- Patient Active Problem List   Diagnosis Date Noted  . PSA elevation 12/30/2013    Priority: Medium  . Bradycardia 12/10/2012    Priority: Medium  . PAC (premature atrial contraction), symptomatic 10/16/2012    Priority: Medium  . THROMBOCYTHEMIA 04/21/2008    Priority: Medium  . CAROTID ARTERY STENOSIS, BILATERAL 11/05/2007    Priority: Medium  . HYPERTENSION 04/29/2007    Priority: Medium  . VITAMIN B12 DEFICIENCY 01/26/2007    Priority: Medium  . HYPERLIPIDEMIA 01/22/2007    Priority: Medium  . Anxiety state, unspecified 12/30/2013    Priority: Low  . Hip arthritis 05/31/2013    Priority: Low  . BPH (benign prostatic hyperplasia) 07/22/2011    Priority: Low  . OSTEOARTHRITIS 03/16/2010    Priority: Low  . Osteopenia 07/06/2009    Priority: Low  . Raynaud's syndrome 06/02/2009    Priority: Low  . PROSTATITIS, RECURRENT 03/01/2009    Priority: Low  . CONSTIPATION, CHRONIC 07/28/2008    Priority: Low  . ESOPHAGEAL STRICTURE 03/04/2008    Priority: Low  . COLONIC POLYPS, HX OF 03/04/2008    Priority: Low  . Unspecified visual loss 10/30/2007    Priority: Low  . INTERNAL HEMORRHOIDS WITHOUT MENTION COMP 08/14/2007    Priority: Low  . GERD 01/22/2007    Priority: Low  . Hypertension   .  Sinus bradycardia   . Symptomatic PVCs   . Asymptomatic bilateral carotid artery stenosis   . Hyperlipidemia    Medications- reviewed and updated Current Outpatient Prescriptions  Medication Sig Dispense Refill  . amLODipine (NORVASC) 2.5 MG tablet Take 1 tablet (2.5 mg total) by mouth at bedtime. 90 tablet 1  . Ascorbic Acid (VITAMIN C) 1000 MG tablet Take 1,000 mg by mouth daily.     [name] aspirin 81 MG tablet Take 81 mg by mouth daily.    [name] atenolol (TENORMIN) 25 MG tablet Take 0.5 tablets (12.5 mg total) by mouth daily. 30 tablet 6  . Calcium 500 MG CHEW Chew 500 mg by mouth daily.    . cetirizine (ZYRTEC) 10 MG tablet Take 10 mg by mouth daily with breakfast.     . Cholecalciferol (VITAMIN D3) 1000 UNITS CAPS Take 2,000 Units by mouth daily.     . Coenzyme Q10 50 MG CAPS Take 50 mg by mouth daily.     . Cyanocobalamin 2500 MCG SUBL Place 2,500 mcg under the tongue daily.     . Magnesium 200 MG TABS Take 200 mg by mouth 2 (two) times daily.    . RABEprazole (ACIPHEX) 20 MG tablet Take 20 mg by mouth at bedtime.     . simvastatin (ZOCOR) 20 MG tablet Take 1 tablet (20 mg total) by mouth daily at 6 PM. 90 tablet 1  . Zinc 25 MG  TABS Take 25 mg by mouth daily.     . diazepam (VALIUM) 5 MG tablet Take 2.5-5 mg by mouth at bedtime as needed for anxiety or sedation.    . polyethylene glycol (MIRALAX / GLYCOLAX) packet Take 17 g by mouth daily.      No current facility-administered medications for this visit.    Objective: BP 110/50 mmHg  Pulse 48  Temp(Src) 97.9 F (36.6 C)  Wt 156 lb (70.761 kg)  SpO2 96% Gen: NAD, resting comfortably on table HEENT: nasal turbinates swollen with yellow discharge, very mildly tender sinuses, TM normal, pharynx with signs of drainage CV: bradycardic but regular,  no murmurs rubs or gallops Lungs: CTAB no crackles, wheeze, rhonchi Abdomen: soft/nontender/nondistended/normal bowel sounds.  Skin: warm, dry Neuro: grossly normal, moves all extremities     Dg Chest 2 View  03/28/2014   CLINICAL DATA:  Cough for 2 weeks, hypertension  EXAM: CHEST  2 VIEW  COMPARISON:  05/27/2013  FINDINGS: Cardiomediastinal silhouette is stable. No acute infiltrate or pleural effusion. No pulmonary edema. Bony thorax is unremarkable.  IMPRESSION: No active cardiopulmonary disease.   Electronically Signed   By: Lahoma Crocker M.D.   On: 03/28/2014 10:51  Personally reviewed CXR-no obvious PNA j Assessment/Plan:  Upper Respiratory Infection Advised of symptomatic care (see AVS).  Doubt influenza as afebrile (although he did get the flu shot and could be mild form) CXR due to history of pneumonia and patient concern for walking pneumonia.  2 weeks of symptoms and patient with sinus tenderness-discussed trial 7 days of augmentin for potential bacterial sinusitis if symptoms do not resolve in next 3 days.  Avoid prednisone as gets jittery.   Orders Placed This Encounter  Procedures  . DG Chest 2 View    Standing Status: Future     Number of Occurrences: 1     Standing Expiration Date: 05/29/2015    Order Specific Question:  Reason for Exam (SYMPTOM  OR DIAGNOSIS REQUIRED)    Answer:  cough x 2 weeks    Order Specific Question:  Preferred imaging location?    Answer:  [facility]

## 2014-03-28 NOTE — Patient Instructions (Signed)
I think this is a possible sinus infection given nasal discharge still continuing and appearence of back of your throat.   Given 2 weeks of symptoms and history of pneumonia, we agreed to rule out walking pneumonia. You can take your tussionex for 3 days.   You will call me if no improvement by that time and we will call in 7 days of Augmentin (antibiotic) to use. You would come see [place] if this treatment did not resolve your symptoms.

## 2014-03-29 ENCOUNTER — Encounter: Payer: Self-pay | Admitting: Family Medicine

## 2014-03-30 ENCOUNTER — Other Ambulatory Visit: Payer: Self-pay

## 2014-03-30 MED ORDER — HYDROCOD POLST-CHLORPHEN POLST 10-8 MG/5ML PO LQCR: 5.0000 mL | Freq: Three times a day (TID) | ORAL | Status: DC

## 2014-04-01 ENCOUNTER — Ambulatory Visit (INDEPENDENT_AMBULATORY_CARE_PROVIDER_SITE_OTHER): Payer: Medicare Other | Admitting: Cardiovascular Disease

## 2014-04-01 ENCOUNTER — Encounter: Payer: Self-pay | Admitting: Cardiovascular Disease

## 2014-04-01 VITALS — BP 142/62 | HR 53 | Ht 67.0 in | Wt 156.0 lb

## 2014-04-01 DIAGNOSIS — I491 Atrial premature depolarization: Secondary | ICD-10-CM | POA: Diagnosis not present

## 2014-04-01 DIAGNOSIS — R001 Bradycardia, unspecified: Secondary | ICD-10-CM | POA: Diagnosis not present

## 2014-04-01 DIAGNOSIS — I6529 Occlusion and stenosis of unspecified carotid artery: Secondary | ICD-10-CM

## 2014-04-01 DIAGNOSIS — I10 Essential (primary) hypertension: Secondary | ICD-10-CM | POA: Diagnosis not present

## 2014-04-01 NOTE — Assessment & Plan Note (Signed)
Hyperlipidemia on simvastatin 20 mg. His lipid profile was last checked 05/07/13 revealing a total cholesterol of 152, LDL of 74 and HDL of 43. Continue current therapy

## 2014-04-01 NOTE — Progress Notes (Signed)
04/01/2014 Corey Fischer   02/26/1932  [id number]  Primary Physician Garret Reddish, MD Primary Cardiologist: [name] MD [name]   HPI:  The patient is a delightful 80 year old thin and fit appearing married Caucasian male father of 2, grandfather to 4 grandchildren who was self-referred for evaluation of symptomatic PVCs.  He is retired from working at [employer] 7 years ago. His risk factors are remarkable for treated hypertension and hyperlipidemia. He does not smoke and drinks socially. His family history is remarkable for a mother who had atrial fibrillation and a father who had angina, but no one has had documented ischemic heart disease. He has never had an MI or a stroke and denies chest pain or shortness of breath. His surgical history is remarkable for remote TURP, cholecystectomy, and hernia repair.  He has had PVCs since graduate school and was on propranolol remotely, prescribed by Dr. Coralie Keens. Over the last 2 months he has had progressive PVCs which he is more aware of and symptomatic from. He has cut out his caffeine. Lab work performed by Dr. Arnoldo Morale revealed a normal TSH.  He saw [name] registered nurse practitioner back at the end of May 2014  because of palpitations have gotten worse. He was started on Cardizem 30 mg by mouth twice a day and his simvastatin was decreased from 40-20 mg a day. The 2-D echo is essentially normal. He has been checking his blood pressures which are somewhat sporadic but averaging 1:30 to 742 range systolic.  We have stopped his diltiazem which would cause contributing to his Wenkebach and symptoms and changed him to amlodipine. He had no further symptoms. He denies chest pain or shortness of breath. He does have mild to moderate carotid disease on duplex ultrasound. His lipid profile was excellent on statin therapy last measured 05/07/13.  He saw [name] in our [facility] office 03/07/14 because of several  days of symptom of palpitations. His EKG showed PACs. He was initially begun on metoprolol and later switched to low-dose atenolol resulted in improvement in his symptoms.   Current Outpatient Prescriptions  Medication Sig Dispense Refill  . amLODipine (NORVASC) 2.5 MG tablet Take 1 tablet (2.5 mg total) by mouth at bedtime. 90 tablet 1  . Ascorbic Acid (VITAMIN C) 1000 MG tablet Take 1,000 mg by mouth daily.     [name] aspirin 81 MG tablet Take 81 mg by mouth daily.    [name] atenolol (TENORMIN) 25 MG tablet Take 0.5 tablets (12.5 mg total) by mouth daily. 30 tablet 6  . Calcium 500 MG CHEW Chew 500 mg by mouth daily.    . cetirizine (ZYRTEC) 10 MG tablet Take 10 mg by mouth daily with breakfast.     . chlorpheniramine-HYDROcodone (TUSSIONEX) 10-8 MG/5ML LQCR Take 5 mLs by mouth every 8 (eight) hours. 115 mL 0  . Cholecalciferol (VITAMIN D3) 1000 UNITS CAPS Take 2,000 Units by mouth daily.     . Coenzyme Q10 50 MG CAPS Take 50 mg by mouth daily.     . Cyanocobalamin 2500 MCG SUBL Place 2,500 mcg under the tongue daily.     . diazepam (VALIUM) 5 MG tablet Take 2.5-5 mg by mouth at bedtime as needed for anxiety or sedation.    . Magnesium 200 MG TABS Take 200 mg by mouth 2 (two) times daily.    . polyethylene glycol (MIRALAX / GLYCOLAX) packet Take 17 g by mouth daily.     . RABEprazole (  ACIPHEX) 20 MG tablet Take 20 mg by mouth at bedtime.     . simvastatin (ZOCOR) 20 MG tablet Take 1 tablet (20 mg total) by mouth daily at 6 PM. 90 tablet 1  . Zinc 25 MG TABS Take 25 mg by mouth daily.      No current facility-administered medications for this visit.    Allergies  Allergen Reactions  . Ciprofloxacin     History   Social History  . Marital Status: Married    Spouse Name: N/A    Number of Children: 2  . Years of Education: N/A   Occupational History  . retired    Social History Main Topics  . Smoking status: Never Smoker   . Smokeless tobacco: Never Used  . Alcohol Use: Yes      Comment: occasionally  . Drug Use: No  . Sexual Activity: Yes   Other Topics Concern  . Not on file   Social History Narrative   Grew up in small town in [place]. 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 Met wife in [place]. At 66, got married and went to college-undergrad in [employer] as well as Oceanographer, doctorate in Location manager.  Worked at [employer] which eventually became [employer]. Lived in [place] before eventually moving to [place]. 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 Did consulting until age 14.       Married over 50 years. 75 years 01/2014. 2 married children with 2 children each. 1 son near [place]. 1 son in [place] with house of rep.       Hobbies: work out most mornings 5 days a week (spin, elipitical), racquetball until 2014 after hip surgery.      Review of Systems: General: negative for chills, fever, night sweats or weight changes.  Cardiovascular: negative for chest pain, dyspnea on exertion, edema, orthopnea, palpitations, paroxysmal nocturnal dyspnea or shortness of breath Dermatological: negative for rash Respiratory: negative for cough or wheezing Urologic: negative for hematuria Abdominal: negative for nausea, vomiting, diarrhea, bright red blood per rectum, melena, or hematemesis Neurologic: negative for visual changes, syncope, or dizziness All other systems reviewed and are otherwise negative except as noted above.    Blood pressure 142/62, pulse 53, height 5' 7"  (1.702 m), weight 156 lb (70.761 kg).  General appearance: alert and no distress Neck: no adenopathy, no carotid bruit, no JVD, supple, symmetrical, trachea midline and thyroid not enlarged, symmetric, no tenderness/mass/nodules Lungs: clear to auscultation bilaterally Abdomen: soft, non-tender; bowel sounds normal; no masses,  no organomegaly  EKG sinus bradycardia 53 without ST or T-wave changes. I personally reviewed his EKG  ASSESSMENT AND PLAN:   PAC (premature atrial  contraction), symptomatic [name] was seen by [name] n our [facility] office on 03/07/14 for several days of symptomatic palpitations. 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 He was started on metoprolol which was not effective and later switched to low-dose atenolol which did improve his symptoms. A 12-lead EKG showed PACs. He currently is much improved with minimal palpitations. Continue current therapy  HYPERLIPIDEMIA Hyperlipidemia on simvastatin 20 mg. His lipid profile was last checked 05/07/13 revealing a total cholesterol of 152, LDL of 74 and HDL of 43. Continue current therapy  Essential hypertension History of hypertension with blood pressure measured today at 142/62 on amlodipine 2.5 mg, atenolol 25 mg. We will continue his current medications.      [name] MD [place], [facility] 04/01/2014  10:43 AM

## 2014-04-01 NOTE — Patient Instructions (Signed)
Your physician wants you to follow-up in 1 year with Dr. Berry. You will receive a reminder letter in the mail 2 months in advance. If you do not receive a letter, please call our office to schedule the follow-up appointment.  

## 2014-04-01 NOTE — Assessment & Plan Note (Signed)
History of hypertension with blood pressure measured today at 142/62 on amlodipine 2.5 mg, atenolol 25 mg. We will continue his current medications.

## 2014-04-01 NOTE — Assessment & Plan Note (Signed)
Corey Fischer was seen by [name] n our [address] office on 03/07/14 for several days of symptomatic palpitations. 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 He was started on metoprolol which was not effective and later switched to low-dose atenolol which did improve his symptoms. A 12-lead EKG showed PACs. He currently is much improved with minimal palpitations. Continue current therapy

## 2014-04-20 ENCOUNTER — Telehealth: Payer: Self-pay | Admitting: Cardiovascular Disease

## 2014-04-20 NOTE — Telephone Encounter (Signed)
Returned call to patient he stated he started taking Atenolol 12.5 mg daily.Stated when he exercises he can only get heart rate up to 94 bpm.Stated before he was taking Atenolol heart rate with exercise would be 100 to 115 bpm.Stated he wanted to make sure this was ok.Stated resting heart rate ranging 47 to 51 bpm.B/P 105/51.Stated he has been feeling good. Advised these heart rates are ok.Message sent to [facility] for review.

## 2014-04-20 NOTE — Telephone Encounter (Signed)
Please call,pt is on Atenolol. Heart rate is still low,even when he exercises.Pt is concerned about this.

## 2014-04-21 NOTE — Telephone Encounter (Signed)
This is the intended effect of atenolol

## 2014-05-26 DIAGNOSIS — Z96642 Presence of left artificial hip joint: Secondary | ICD-10-CM | POA: Diagnosis not present

## 2014-05-26 DIAGNOSIS — Z09 Encounter for follow-up examination after completed treatment for conditions other than malignant neoplasm: Secondary | ICD-10-CM | POA: Diagnosis not present

## 2014-05-26 DIAGNOSIS — M5410 Radiculopathy, site unspecified: Secondary | ICD-10-CM | POA: Diagnosis not present

## 2014-05-31 DIAGNOSIS — M47816 Spondylosis without myelopathy or radiculopathy, lumbar region: Secondary | ICD-10-CM | POA: Diagnosis not present

## 2014-06-09 ENCOUNTER — Encounter: Payer: Self-pay | Admitting: Family Medicine

## 2014-06-10 MED ORDER — SIMVASTATIN 20 MG PO TABS: 20.0000 mg | ORAL_TABLET | Freq: Every day | ORAL | Status: DC

## 2014-06-10 MED ORDER — AMLODIPINE BESYLATE 2.5 MG PO TABS: 2.5000 mg | ORAL_TABLET | Freq: Every day | ORAL | Status: DC

## 2014-06-21 DIAGNOSIS — M7062 Trochanteric bursitis, left hip: Secondary | ICD-10-CM | POA: Diagnosis not present

## 2014-06-21 DIAGNOSIS — M1611 Unilateral primary osteoarthritis, right hip: Secondary | ICD-10-CM | POA: Diagnosis not present

## 2014-06-21 DIAGNOSIS — H16141 Punctate keratitis, right eye: Secondary | ICD-10-CM | POA: Diagnosis not present

## 2014-06-21 DIAGNOSIS — M25552 Pain in left hip: Secondary | ICD-10-CM | POA: Diagnosis not present

## 2014-06-22 ENCOUNTER — Encounter: Payer: Self-pay | Admitting: Family Medicine

## 2014-06-24 DIAGNOSIS — M1611 Unilateral primary osteoarthritis, right hip: Secondary | ICD-10-CM | POA: Diagnosis not present

## 2014-06-24 DIAGNOSIS — M7062 Trochanteric bursitis, left hip: Secondary | ICD-10-CM | POA: Diagnosis not present

## 2014-07-04 ENCOUNTER — Ambulatory Visit: Payer: Medicare Other | Admitting: Family Medicine

## 2014-07-04 DIAGNOSIS — M7062 Trochanteric bursitis, left hip: Secondary | ICD-10-CM | POA: Diagnosis not present

## 2014-07-04 DIAGNOSIS — M1611 Unilateral primary osteoarthritis, right hip: Secondary | ICD-10-CM | POA: Diagnosis not present

## 2014-07-06 ENCOUNTER — Ambulatory Visit (INDEPENDENT_AMBULATORY_CARE_PROVIDER_SITE_OTHER): Payer: Medicare Other | Admitting: Family Medicine

## 2014-07-06 ENCOUNTER — Encounter: Payer: Self-pay | Admitting: Family Medicine

## 2014-07-06 VITALS — BP 140/56 | Temp 98.1°F | Wt 152.0 lb

## 2014-07-06 DIAGNOSIS — E785 Hyperlipidemia, unspecified: Secondary | ICD-10-CM

## 2014-07-06 DIAGNOSIS — R739 Hyperglycemia, unspecified: Secondary | ICD-10-CM | POA: Diagnosis not present

## 2014-07-06 DIAGNOSIS — E875 Hyperkalemia: Secondary | ICD-10-CM | POA: Diagnosis not present

## 2014-07-06 DIAGNOSIS — I10 Essential (primary) hypertension: Secondary | ICD-10-CM

## 2014-07-06 DIAGNOSIS — E538 Deficiency of other specified B group vitamins: Secondary | ICD-10-CM

## 2014-07-06 DIAGNOSIS — M8580 Other specified disorders of bone density and structure, unspecified site: Secondary | ICD-10-CM | POA: Diagnosis not present

## 2014-07-06 DIAGNOSIS — N400 Enlarged prostate without lower urinary tract symptoms: Secondary | ICD-10-CM | POA: Diagnosis not present

## 2014-07-06 LAB — LIPID PANEL
CHOL/HDL RATIO: 4
Cholesterol: 178 mg/dL (ref 0–200)
HDL: 42.8 mg/dL (ref >39.00)
LDL CALC: 97 mg/dL (ref 0–99)
NonHDL: 135.2
TRIGLYCERIDES: 192 mg/dL — AB (ref 0.0–149.0)
VLDL: 38.4 mg/dL (ref 0.0–40.0)

## 2014-07-06 LAB — CBC
HEMATOCRIT: 46.8 % (ref 39.0–52.0)
Hemoglobin: 15.9 g/dL (ref 13.0–17.0)
MCHC: 33.9 g/dL (ref 30.0–36.0)
MCV: 88 fl (ref 78.0–100.0)
Platelets: 163 10*3/uL (ref 150.0–400.0)
RBC: 5.32 Mil/uL (ref 4.22–5.81)
RDW: 13.3 % (ref 11.5–15.5)
WBC: 5.8 10*3/uL (ref 4.0–10.5)

## 2014-07-06 LAB — COMPREHENSIVE METABOLIC PANEL
ALBUMIN: 4.3 g/dL (ref 3.5–5.2)
ALK PHOS: 52 U/L (ref 39–117)
ALT: 14 U/L (ref 0–53)
AST: 17 U/L (ref 0–37)
BUN: 19 mg/dL (ref 6–23)
CALCIUM: 10.3 mg/dL (ref 8.4–10.5)
CO2: 29 mEq/L (ref 19–32)
CREATININE: 1.16 mg/dL (ref 0.40–1.50)
Chloride: 105 mEq/L (ref 96–112)
GFR: 64.32 mL/min (ref >60.00)
Glucose, Bld: 99 mg/dL (ref 70–99)
Potassium: 5.7 mEq/L — ABNORMAL HIGH (ref 3.5–5.1)
Sodium: 140 mEq/L (ref 135–145)
Total Bilirubin: 0.7 mg/dL (ref 0.2–1.2)
Total Protein: 7.2 g/dL (ref 6.0–8.3)

## 2014-07-06 LAB — VITAMIN B12: Vitamin B-12: 1010 pg/mL — ABNORMAL HIGH (ref 211–911)

## 2014-07-06 LAB — TSH: TSH: 1.65 u[IU]/mL (ref 0.35–4.50)

## 2014-07-06 LAB — HEMOGLOBIN A1C: Hgb A1c MFr Bld: 5.9 % (ref 4.6–6.5)

## 2014-07-06 NOTE — Assessment & Plan Note (Signed)
Check b12 today. Continue sublingual supplement.

## 2014-07-06 NOTE — Assessment & Plan Note (Signed)
Discussed using vitamin D alone instead of along with calcium. Patient may or may not do this. He was told he had some Calcium blockage in aorta on a back MRI and I discussed this could potentially occur with supplementation or without.

## 2014-07-06 NOTE — Patient Instructions (Signed)
Check labs today  Cardiology was ok with BP at 140 and I think this is reasonable over age 81 as long as no progression of carotid artery disease  See me back in 6 months for annual wellness visit (30 minute slot)  Hope you have a good report through Dr. Risa Grill. You declined PSA at our office today and will have that done at their office.

## 2014-07-06 NOTE — Assessment & Plan Note (Signed)
Simvastatin 20mg . Previously controlled-check lipids today.

## 2014-07-06 NOTE — Assessment & Plan Note (Signed)
Discussed checking PSA today. Patient declines and will follow up with Dr. Risa Grill.

## 2014-07-06 NOTE — Progress Notes (Signed)
Garret Reddish, MD Phone: [phone]  Subjective:   Corey Fischer is a 81 y.o. year old very pleasant male patient who presents with the following:  Elevated PSA with history of BPH and TURP We had planneed to Recheck PSA today. Patient declines and wants to follow up through Dr. Risa Grill.  Ros-no unintentional weight loss or bone pain.   We also discussed vitamin b12 deficiency. He is compliant with his sublingual supplement and requests repeat today. We also discussed his vitamins. He has osteopenia so may have some benefit from calcium but worst score -1.4, discussed using vit D alone. Patient will decline. We also discussed previous hyperglycemia and patient would like to check a1c which i think is agreeable.   Hyperlipidemia-previously controlled on simvastatin 20mg   Lab Results  Component Value Date   LDLCALC 74 05/07/2013    Hypertension-reasonable control on amlodipine 2.5mg  and atenolol  BP Readings from Last 3 Encounters:  07/06/14 140/56  04/01/14 142/62  03/28/14 110/50   We had planned to Follow spot R ear and left upper back- saw dermatology who has resumed care. He had cryotherapy on right ear.   ROS- no lower extremity weakness. No polydipsia. no chest pain or shortness of breath. No myalgias  Past Medical History- Patient Active Problem List   Diagnosis Date Noted  . Hyperglycemia 07/06/2014    Priority: Medium  . Symptomatic PVCs     Priority: Medium  . Bradycardia 12/10/2012    Priority: Medium  . PAC (premature atrial contraction), symptomatic 10/16/2012    Priority: Medium  . BPH (benign prostatic hyperplasia) 07/22/2011    Priority: Medium  . THROMBOCYTHEMIA 04/21/2008    Priority: Medium  . Carotid artery stenosis 11/05/2007    Priority: Medium  . Essential hypertension 04/29/2007    Priority: Medium  . Hyperlipidemia 01/22/2007    Priority: Medium  . Anxiety state, unspecified 12/30/2013    Priority: Low  . Osteoarthritis 03/16/2010   Priority: Low  . Osteopenia 07/06/2009    Priority: Low  . Raynaud's syndrome 06/02/2009    Priority: Low  . Prostatitis 03/01/2009    Priority: Low  . CONSTIPATION, CHRONIC 07/28/2008    Priority: Low  . ESOPHAGEAL STRICTURE 03/04/2008    Priority: Low  . COLONIC POLYPS, HX OF 03/04/2008    Priority: Low  . Unspecified visual loss 10/30/2007    Priority: Low  . Vitamin B12 deficiency 01/26/2007    Priority: Low  . GERD 01/22/2007    Priority: Low   Medications- reviewed and updated Current Outpatient Prescriptions  Medication Sig Dispense Refill  . amLODipine (NORVASC) 2.5 MG tablet Take 1 tablet (2.5 mg total) by mouth at bedtime. 90 tablet 1  . Ascorbic Acid (VITAMIN C) 1000 MG tablet Take 1,000 mg by mouth daily.     [name] aspirin 81 MG tablet Take 81 mg by mouth daily.    [name] atenolol (TENORMIN) 25 MG tablet Take 0.5 tablets (12.5 mg total) by mouth daily. 30 tablet 6  . Calcium 500 MG CHEW Chew 500 mg by mouth daily.    . cetirizine (ZYRTEC) 10 MG tablet Take 10 mg by mouth daily with breakfast.     . chlorpheniramine-HYDROcodone (TUSSIONEX) 10-8 MG/5ML LQCR Take 5 mLs by mouth every 8 (eight) hours. (Patient not taking: Reported on 07/06/2014) 115 mL 0  . Cholecalciferol (VITAMIN D3) 1000 UNITS CAPS Take 2,000 Units by mouth daily.     . Coenzyme Q10 50 MG CAPS Take 50 mg by mouth daily.     [name]  Cyanocobalamin 2500 MCG SUBL Place 2,500 mcg under the tongue daily.     . diazepam (VALIUM) 5 MG tablet Take 2.5-5 mg by mouth at bedtime as needed for anxiety or sedation.    . Magnesium 200 MG TABS Take 200 mg by mouth 2 (two) times daily.    . polyethylene glycol (MIRALAX / GLYCOLAX) packet Take 17 g by mouth daily.     . RABEprazole (ACIPHEX) 20 MG tablet Take 20 mg by mouth at bedtime.     . simvastatin (ZOCOR) 20 MG tablet Take 1 tablet (20 mg total) by mouth daily at 6 PM. 90 tablet 1  . Zinc 25 MG TABS Take 25 mg by mouth daily.      No current facility-administered medications  for this visit.    Objective: BP 140/56 mmHg  Temp(Src) 98.1 F (36.7 C)  Wt 152 lb (68.947 kg) Gen: NAD, resting comfortably in chair Mucous membranes are moist. CV: RRR no murmurs rubs or gallops Lungs: CTAB no crackles, wheeze, rhonchi Abdomen: soft/nontender/nondistended/normal bowel sounds.  Ext: no edema Skin: warm, dry, likely AK R ear resolved after cryotherapy through derm   Assessment/Plan:  BPH (benign prostatic hyperplasia) Discussed checking PSA today. Patient declines and will follow up with Dr. Risa Grill.    Osteopenia Discussed using vitamin D alone instead of along with calcium. Patient may or may not do this. He was told he had some Calcium blockage in aorta on a back MRI and I discussed this could potentially occur with supplementation or without.    Vitamin B12 deficiency Check b12 today. Continue sublingual supplement.    Hyperglycemia Elevated fasting CBG 100-110. Check a1c.    Hyperlipidemia Simvastatin 20mg . Previously controlled-check lipids today.    Essential hypertension Reasonable control for age with SBP <150. With carotid artery stenosis, could try to push to 140 but cardiology has reviewed and did not suggest further reduction. Continue with reasonable/moderate control on amlodipine 2.5mg  and atenolol     Return precautions advised. 6 months for AWV.   Orders Placed This Encounter  Procedures  . CBC    [place]  . Comprehensive metabolic panel    [place]    Order Specific Question:  Has the patient fasted?    Answer:  No  . Lipid panel    [place]    Order Specific Question:  Has the patient fasted?    Answer:  No  . TSH    [place]  . Hemoglobin A1c      . Vitamin B12

## 2014-07-06 NOTE — Assessment & Plan Note (Signed)
Elevated fasting CBG 100-110. Check a1c.

## 2014-07-06 NOTE — Assessment & Plan Note (Signed)
Reasonable control for age with SBP <150. With carotid artery stenosis, could try to push to 140 but cardiology has reviewed and did not suggest further reduction. Continue with reasonable/moderate control on amlodipine 2.5mg  and atenolol

## 2014-07-07 ENCOUNTER — Encounter: Payer: Self-pay | Admitting: Family Medicine

## 2014-07-07 ENCOUNTER — Other Ambulatory Visit (INDEPENDENT_AMBULATORY_CARE_PROVIDER_SITE_OTHER): Payer: Medicare Other

## 2014-07-07 DIAGNOSIS — E875 Hyperkalemia: Secondary | ICD-10-CM

## 2014-07-07 LAB — BASIC METABOLIC PANEL
BUN: 21 mg/dL (ref 6–23)
CHLORIDE: 103 meq/L (ref 96–112)
CO2: 29 mEq/L (ref 19–32)
Calcium: 9.8 mg/dL (ref 8.4–10.5)
Creatinine, Ser: 1.13 mg/dL (ref 0.40–1.50)
GFR: 66.29 mL/min (ref >60.00)
GLUCOSE: 100 mg/dL — AB (ref 70–99)
POTASSIUM: 4 meq/L (ref 3.5–5.1)
Sodium: 136 mEq/L (ref 135–145)

## 2014-07-07 NOTE — Addendum Note (Signed)
Addended by: Clyde Lundborg A on: 07/07/2014 10:55 AM   Modules accepted: Orders

## 2014-07-11 ENCOUNTER — Encounter: Payer: Self-pay | Admitting: Family Medicine

## 2014-07-11 ENCOUNTER — Ambulatory Visit (INDEPENDENT_AMBULATORY_CARE_PROVIDER_SITE_OTHER): Payer: Medicare Other | Admitting: Family Medicine

## 2014-07-11 VITALS — BP 138/50 | HR 50 | Temp 99.4°F | Wt 157.0 lb

## 2014-07-11 DIAGNOSIS — J069 Acute upper respiratory infection, unspecified: Secondary | ICD-10-CM

## 2014-07-11 DIAGNOSIS — B9789 Other viral agents as the cause of diseases classified elsewhere: Principal | ICD-10-CM

## 2014-07-11 MED ORDER — HYDROCOD POLST-CHLORPHEN POLST 10-8 MG/5ML PO LQCR: 5.0000 mL | Freq: Two times a day (BID) | ORAL | Status: DC | PRN

## 2014-07-11 NOTE — Progress Notes (Signed)
Garret Reddish, MD Phone: [phone]  Subjective:   Corey Fischer is a 81 y.o. year old very pleasant male patient who presents with the following:  Cough -started with cough Friday afternoon after lunch. Has felt ok otherwise. Deep cough, mainly nonproductive occasionally with yellow phlegm. Cough has slightly improved but overall feeling worse-worsening fatigue and skin sensitivity. Has taken Tussionex every 8 hours and this has helped some with cough symptoms. Helps him at least sleep. No other treatments tried. Has not noted elevated temperature at home. Has noted some sinus tenderness especially when laying down. Possible sick contacts through our office as multiple people sick with similar illness on staff.   History of pneumonia. Has received both pneumonia vaccines.   ROS- no sore throat, no congestion, no wheeze, no shortness of breath   Past Medical History- Patient Active Problem List   Diagnosis Date Noted  . Hyperglycemia 07/06/2014    Priority: Medium  . Symptomatic PVCs     Priority: Medium  . Bradycardia 12/10/2012    Priority: Medium  . PAC (premature atrial contraction), symptomatic 10/16/2012    Priority: Medium  . BPH (benign prostatic hyperplasia) 07/22/2011    Priority: Medium  . THROMBOCYTHEMIA 04/21/2008    Priority: Medium  . Carotid artery stenosis 11/05/2007    Priority: Medium  . Essential hypertension 04/29/2007    Priority: Medium  . Hyperlipidemia 01/22/2007    Priority: Medium  . Anxiety state, unspecified 12/30/2013    Priority: Low  . Osteoarthritis 03/16/2010    Priority: Low  . Osteopenia 07/06/2009    Priority: Low  . Raynaud's syndrome 06/02/2009    Priority: Low  . Prostatitis 03/01/2009    Priority: Low  . CONSTIPATION, CHRONIC 07/28/2008    Priority: Low  . ESOPHAGEAL STRICTURE 03/04/2008    Priority: Low  . COLONIC POLYPS, HX OF 03/04/2008    Priority: Low  . Unspecified visual loss 10/30/2007    Priority: Low  . Vitamin  B12 deficiency 01/26/2007    Priority: Low  . GERD 01/22/2007    Priority: Low   Medications- reviewed and updated Current Outpatient Prescriptions  Medication Sig Dispense Refill  . amLODipine (NORVASC) 2.5 MG tablet Take 1 tablet (2.5 mg total) by mouth at bedtime. 90 tablet 1  . Ascorbic Acid (VITAMIN C) 1000 MG tablet Take 1,000 mg by mouth daily.     [name] aspirin 81 MG tablet Take 81 mg by mouth daily.    [name] atenolol (TENORMIN) 25 MG tablet Take 0.5 tablets (12.5 mg total) by mouth daily. 30 tablet 6  . Calcium 500 MG CHEW Chew 500 mg by mouth daily.    . cetirizine (ZYRTEC) 10 MG tablet Take 10 mg by mouth daily with breakfast.     . Cholecalciferol (VITAMIN D3) 1000 UNITS CAPS Take 2,000 Units by mouth daily.     . Coenzyme Q10 50 MG CAPS Take 50 mg by mouth daily.     . Cyanocobalamin 2500 MCG SUBL Place 2,500 mcg under the tongue daily.     . Magnesium 200 MG TABS Take 200 mg by mouth 2 (two) times daily.    . polyethylene glycol (MIRALAX / GLYCOLAX) packet Take 17 g by mouth daily.     . RABEprazole (ACIPHEX) 20 MG tablet Take 20 mg by mouth at bedtime.     . simvastatin (ZOCOR) 20 MG tablet Take 1 tablet (20 mg total) by mouth daily at 6 PM. 90 tablet 1  . Zinc 25 MG  TABS Take 25 mg by mouth daily.     . diazepam (VALIUM) 5 MG tablet Take 2.5-5 mg by mouth at bedtime as needed for anxiety or sedation.     Objective: BP 138/50 mmHg  Pulse 50  Temp(Src) 99.4 F (37.4 C)  Wt 157 lb (71.215 kg)  SpO2 96% Gen: NAD, resting comfortably in chair Deep intermittent cough noted, nonproductive during visit Naral turbinates erythematous with some yellow congestion. Minimal sinus tenderness. Pharynx with signs of drainage.  CV: RRR no murmurs rubs or gallops Lungs: CTAB no crackles, wheeze, rhonchi Abdomen: soft/nontender/nondistended/normal bowel sounds.  Ext: no edema Skin: warm, dry, no rash    Assessment/Plan:  Viral illness-URI vs. Early sinusitis Suspect URI with nonfocal  exam. Does seem to have some postnasal drip possibly and nasal turbinate edema and possible sinusitis. We discussed reasons for return. With way cough sounded, would not be shocked by bronchitis but no wheeze or rhonchi on exam. Strict return precautions advised as per AVS. Mainly symptomatic care, rest, hydration. Discussed CXR if febrile or SOB.   Meds ordered this encounter  Medications  . chlorpheniramine-HYDROcodone (TUSSIONEX) 10-8 MG/5ML LQCR    Sig: Take 5 mLs by mouth every 12 (twelve) hours as needed for cough.    Dispense:  115 mL    Refill:  0

## 2014-07-11 NOTE — Patient Instructions (Addendum)
Viral infection- sinus vs upper respiratory most likely. Possible early bronchitis. This matters regarding duration. Treatment unfortunately is symptomatic for all of these. Tussionex for cough given. May also try neti pot or nasal sinus wash to try to rinse the sinuses  Please see me if you have new symptoms, fever above 100.5, worsening of symptoms after they improve, shortness of breath. Or if you have other concerns you would like to discuss.

## 2014-07-25 ENCOUNTER — Encounter: Payer: Self-pay | Admitting: Physician Assistant

## 2014-07-25 ENCOUNTER — Ambulatory Visit (INDEPENDENT_AMBULATORY_CARE_PROVIDER_SITE_OTHER): Payer: Medicare Other | Admitting: Physician Assistant

## 2014-07-25 VITALS — BP 104/50 | HR 56 | Ht 66.75 in | Wt 151.5 lb

## 2014-07-25 DIAGNOSIS — K219 Gastro-esophageal reflux disease without esophagitis: Secondary | ICD-10-CM

## 2014-07-25 MED ORDER — FAMOTIDINE 40 MG PO TABS: 40.0000 mg | ORAL_TABLET | Freq: Two times a day (BID) | ORAL | Status: DC

## 2014-07-25 NOTE — Progress Notes (Signed)
Agree 

## 2014-07-25 NOTE — Patient Instructions (Signed)
We sent a prescription to [facility] . 1. Pepcid 40 mg

## 2014-07-25 NOTE — Progress Notes (Signed)
Patient ID: Corey Fischer, male   DOB: 12/12/1931, 82 y.o.   MRN: [id number]   Subjective:    Patient ID: Corey Fischer, male    DOB: 12/02/1931, 82 y.o.   MRN: [id number]  HPI Corey Fischer is a pleasant 82-year-old white male known to Dr. [name]. He has history of hypertension, hyperlipidemia, carotid stenosis, and GERD. He has had colon polyps and diverticulosis. Last colonoscopy was done in 2010. He had EGD in 2011 was noted to have a benign ringlike, distal esophageal stricture, which was not dilated and small pedunculated polyps in the stomach consistent with fundic gland polyps. Patient comes in today to discuss his acid reflux and medication options. Patient says he has been on some type of prescription medication for reflux for many many years. He had initially taken Tagamet and then Zantac followed by Prevacid for several years and has now been on AcipHex over the past 10 years or so. Patient says he has had the head of his bed elevated for a long time and tries to follow an antireflux regimen. He states during the day he may have intermittent reflux type symptoms ,but primarily has symptoms at night.. Denies any dysphagia or odynophagia. He says he is awakened sometimes with a sensation of needing to belch and with air in his esophagus which is uncomfortable. He does have occasional nocturnal sour brash. He tries to have 2-3 hours after mealtime before lying down. He raises concern about the recent reports of PPIs and memory loss and would like to try to get off of PPIs if possible.  Review of Systems Pertinent positive and negative review of systems were noted in the above HPI section.  All other review of systems was otherwise negative.  Outpatient Encounter Prescriptions as of 07/25/2014  Medication Sig  . amLODipine (NORVASC) 2.5 MG tablet Take 1 tablet (2.5 mg total) by mouth at bedtime. (Patient taking differently: Take 2.5 mg by mouth daily. )  . Ascorbic Acid (VITAMIN C) 1000 MG tablet Take 1,000 mg  by mouth daily.   . aspirin 81 MG tablet Take 81 mg by mouth daily.  . atenolol (TENORMIN) 25 MG tablet Take 0.5 tablets (12.5 mg total) by mouth daily.  . cetirizine (ZYRTEC) 10 MG tablet Take 10 mg by mouth daily with breakfast.   . chlorpheniramine-HYDROcodone (TUSSIONEX) 10-8 MG/5ML LQCR Take 5 mLs by mouth every 12 (twelve) hours as needed for cough.  . Cholecalciferol (VITAMIN D3) 1000 UNITS CAPS Take 2,000 Units by mouth daily.   . Coenzyme Q10 50 MG CAPS Take 50 mg by mouth daily.   . Cyanocobalamin 2500 MCG SUBL Place 2,500 mcg under the tongue daily.   . diazepam (VALIUM) 5 MG tablet Take 2.5-5 mg by mouth at bedtime as needed for anxiety or sedation.  . Magnesium 200 MG TABS Take 200 mg by mouth daily.   . polyethylene glycol (MIRALAX / GLYCOLAX) packet Take 17 g by mouth daily.   . simvastatin (ZOCOR) 20 MG tablet Take 1 tablet (20 mg total) by mouth daily at 6 PM. (Patient taking differently: Take 20 mg by mouth at bedtime. )  . Zinc 25 MG TABS Take 25 mg by mouth daily.   . [DISCONTINUED] RABEprazole (ACIPHEX) 20 MG tablet Take 20 mg by mouth at bedtime.   . famotidine (PEPCID) 40 MG tablet Take 1 tablet (40 mg total) by mouth 2 (two) times daily.  . [DISCONTINUED] Calcium 500 MG CHEW Chew 500 mg by mouth daily.     Allergies  Allergen Reactions  . Ciprofloxacin    Patient Active Problem List   Diagnosis Date Noted  . Hyperglycemia 07/06/2014  . Symptomatic PVCs   . Anxiety state, unspecified 12/30/2013  . Bradycardia 12/10/2012  . PAC (premature atrial contraction), symptomatic 10/16/2012  . BPH (benign prostatic hyperplasia) 07/22/2011  . Osteoarthritis 03/16/2010  . Osteopenia 07/06/2009  . Raynaud's syndrome 06/02/2009  . Prostatitis 03/01/2009  . CONSTIPATION, CHRONIC 07/28/2008  . THROMBOCYTHEMIA 04/21/2008  . ESOPHAGEAL STRICTURE 03/04/2008  . COLONIC POLYPS, HX OF 03/04/2008  . Carotid artery stenosis 11/05/2007  . Unspecified visual loss 10/30/2007  .  Essential hypertension 04/29/2007  . Vitamin B12 deficiency 01/26/2007  . Hyperlipidemia 01/22/2007  . GERD 01/22/2007   History   Social History  . Marital Status: Married    Spouse Name: N/A  . Number of Children: 2  . Years of Education: N/A   Occupational History  . retired    Social History Main Topics  . Smoking status: Never Smoker   . Smokeless tobacco: Never Used  . Alcohol Use: Yes     Comment: occasionally  . Drug Use: No  . Sexual Activity: Yes   Other Topics Concern  . Not on file   Social History Narrative   Grew up in small town in [place]. 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 Met wife in [place]. At 85, got married and went to college-undergrad in [employer] as well as Oceanographer, doctorate in Location manager.  Worked at [employer] which eventually became [employer]. Lived in [place] and [place] before eventually moving to [place]. 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       Married over 50 years. 33 years 01/2014. 2 married children with 2 children each. 1 son near [place]. 1 son in [place] with house of rep.       Hobbies: work out most mornings 5 days a week (spin, elipitical), racquetball until 2014 after hip surgery.     Mr. Dunklee family history includes Angina in his father; Cancer in his maternal grandmother; Colon polyps in his mother; Heart disease in his father and mother; Heart failure in his maternal grandfather. There is no history of Colon cancer.      Objective:    Filed Vitals:   07/25/14 0956  BP: 104/50  Pulse: 56    Physical Exam  well-developed elderly white male in no acute distress, pleasant blood pressure 104/50 pulse 56 height 5 foot 6 weight 151. Discussion only today ,not further examined.      Assessment & Plan:   #1 81 yo male with chronic GERD, nocturnal reflux #2 diverticulosis #3 colon polyps-hyperplastic 2010 #4 HTN #5 osteopenia  Plan; Discussed antireflux measures, NPO for  several hours before bed, increased elevation of bed. Will try switching to Pepcid 40 mg BID- he will call back in several weeks with update - if sxs not controlled will change to Prilosec 40 mg ac dinner . Follow up with Dr. Henrene Pastor as needed   Corey Kozar Genia Harold PA-C 07/25/2014   Cc: Marin Olp, MD

## 2014-08-08 ENCOUNTER — Encounter: Payer: Self-pay | Admitting: Cardiology

## 2014-08-08 ENCOUNTER — Ambulatory Visit (INDEPENDENT_AMBULATORY_CARE_PROVIDER_SITE_OTHER): Payer: Medicare Other | Admitting: Cardiology

## 2014-08-08 ENCOUNTER — Telehealth: Payer: Self-pay | Admitting: Cardiovascular Disease

## 2014-08-08 VITALS — BP 116/60 | HR 62 | Ht 66.0 in | Wt 148.8 lb

## 2014-08-08 DIAGNOSIS — I491 Atrial premature depolarization: Secondary | ICD-10-CM

## 2014-08-08 NOTE — Progress Notes (Signed)
Cardiology Office Note   Date:  08/08/2014   ID:  Corey Fischer, DOB January 02, 1932, MRN [id number]  PCP:  Corey Reddish, MD  Cardiologist:  [facility]    Chief Complaint  Patient presents with  . Palpitations    past 3 days. has felt heart fluttering      History of Present Illness: Corey Fischer is a 81 y.o. male who presents for palpitations.  History of HLD, HTN, carotid artery disease, PACs, PVCs who presents to clinic for palpitations. 2D Echo 05/2012: mild LVH, EF 55-60%, no RWMA, aortic sclerosis without stenosis, mildly thickened MV, mod TR. Event monitor 11/2012: NSR/SB with 1st degree AVB and PACs/atrial bigeminy. He was placed on low dose diltiazem in 2014 with subjective worsening of symptoms. EKG 11/2012 was interpreted as Mobitz I AV block so dilitazem was discontinued (however, see below - later felt not to represent AV block). He had a normal nuc in 12/2012. He has been on amlodipine since then which he felt had improved his symptoms.  Occ episodes of PACs.  Labs in Feb were normal except for elevated K+ but this on recheck was normal.  Beginning last Friday pt was stressed and began having palpitations.  He presents today for eval after calling in.  No chest pain.  Every so often he may have a hard beat and is more aware.  If he becomes active the palpitations go away.   Difficult to increase BB due to slow HR at times in the low 50s or upper 40s.  No lightheadedness.  No SOB.  Past Medical History  Diagnosis Date  . Chronic ITP (idiopathic thrombocytopenia)   . Hx of adenomatous colonic polyps 2010  . Diverticulosis   . GERD (gastroesophageal reflux disease)   . Allergy   . Hyperlipidemia   . Hypertension   . Elevated PSA   . Anxiety   . Depression   . Stricture and stenosis of esophagus 2010    last endoscopy- 2010  . Gastritis   . Hemorrhoids   . History of hiatal hernia   . Raynaud's syndrome   . Gastric polyp   . Asymptomatic bilateral carotid artery  stenosis     a. Last duplex 11/2013 R bulb and ICA - 50-69%, R ECA >50%. L bulb and prox ICA: 0-49%. Repeat due 11/2014.  . Arthritis   . Osteoarthritis hips  . BPH (benign prostatic hyperplasia)   . B12 deficiency   . Palpitations   . Symptomatic PVCs     a. Per report, monitor 05/2012 - NSR with symptomatic PVC. b. Echo 05/2012- EF 55-60%, aortic sclerosis without stenosis. c. Nuc 12/2012 - normal.  . H/O hiatal hernia   . PAC (premature atrial contraction)     a. NSR/SB with 1st degree AVB and PACs/atrial bigeminy seen on event monitor 11/2012.  . Sinus bradycardia   . Wandering (atrial) pacemaker   . INTERNAL HEMORRHOIDS WITHOUT MENTION COMP 08/14/2007    Qualifier: Diagnosis of  By: Arnoldo Morale MD, Balinda Quails     Past Surgical History  Procedure Laterality Date  . Rotator cuff repair Right 1987  . Tonsillectomy  age 52  . Appendectomy  age 57  . Laparoscopic inguinal hernia repair Bilateral 09-19-2010  . Left shoulder surg.  2006  . Cholecystectomy  1989  . Carotid duplex Bilateral 05-26-2007  . Transurethral resection of prostate  07/22/2011    Procedure: TRANSURETHRAL RESECTION OF THE PROSTATE WITH GYRUS INSTRUMENTS;  Surgeon: Bernestine Amass, MD;  Location: [facility];  Service: Urology;  Laterality: N/A;  Saline Gyrus TURP   . Cardiac catheterization  1989    Told that he is "pristine "  . Total hip arthroplasty Left 05/31/2013    Procedure: TOTAL HIP ARTHROPLASTY;  Surgeon: Kerin Salen, MD;  Location: [facility];  Service: Orthopedics;  Laterality: Left;     Current Outpatient Prescriptions  Medication Sig Dispense Refill  . amLODipine (NORVASC) 2.5 MG tablet Take 1 tablet (2.5 mg total) by mouth at bedtime. (Patient taking differently: Take 2.5 mg by mouth daily. ) 90 tablet 1  . Ascorbic Acid (VITAMIN C) 1000 MG tablet Take 1,000 mg by mouth daily.     [name] aspirin 81 MG tablet Take 81 mg by mouth daily.    [name] atenolol (TENORMIN) 25 MG tablet Take 0.5 tablets (12.5 mg  total) by mouth daily. 30 tablet 6  . cetirizine (ZYRTEC) 10 MG tablet Take 10 mg by mouth daily with breakfast.     . chlorpheniramine-HYDROcodone (TUSSIONEX) 10-8 MG/5ML LQCR Take 5 mLs by mouth every 12 (twelve) hours as needed for cough. 115 mL 0  . Cholecalciferol (VITAMIN D3) 1000 UNITS CAPS Take 2,000 Units by mouth daily.     . Coenzyme Q10 50 MG CAPS Take 50 mg by mouth daily.     . Cyanocobalamin 2500 MCG SUBL Place 2,500 mcg under the tongue daily.     . diazepam (VALIUM) 5 MG tablet Take 2.5-5 mg by mouth at bedtime as needed for anxiety or sedation.    . famotidine (PEPCID) 40 MG tablet Take 1 tablet (40 mg total) by mouth 2 (two) times daily. 60 tablet 1  . Magnesium 200 MG TABS Take 200 mg by mouth daily.     . polyethylene glycol (MIRALAX / GLYCOLAX) packet Take 17 g by mouth daily.     . simvastatin (ZOCOR) 20 MG tablet Take 1 tablet (20 mg total) by mouth daily at 6 PM. (Patient taking differently: Take 20 mg by mouth at bedtime. ) 90 tablet 1  . Zinc 25 MG TABS Take 25 mg by mouth daily.      No current facility-administered medications for this visit.    Allergies:   Ciprofloxacin    Social History:  The patient  reports that he has never smoked. He has never used smokeless tobacco. He reports that he drinks alcohol. He reports that he does not use illicit drugs.   Family History:  The patient's family history includes Angina in his father; Cancer in his maternal grandmother; Colon polyps in his mother; Heart disease in his father and mother; Heart failure in his maternal grandfather. There is no history of Colon cancer.    ROS:  General:  + cough 2 weeks ago now improved no current colds or fevers, no weight changes Skin:no rashes or ulcers HEENT:no blurred vision, no congestion CV:see HPI PUL:see HPI GI:no diarrhea constipation or melena, no indigestion GU:no hematuria, no dysuria MS:no joint pain, no claudication Neuro:no syncope, no lightheadedness Endo:no  diabetes, no thyroid disease  Wt Readings from Last 3 Encounters:  08/08/14 148 lb 12.8 oz (67.495 kg)  07/25/14 151 lb 8 oz (68.72 kg)  07/11/14 157 lb (71.215 kg)     PHYSICAL EXAM: VS:  BP 116/60 mmHg  Pulse 62  Ht 5\' 6"  (1.676 m)  Wt 148 lb 12.8 oz (67.495 kg)  BMI 24.03 kg/m2 , BMI Body mass index is 24.03 kg/(m^2). General:Pleasant affect, NAD Skin:Warm and  dry, brisk capillary refill HEENT:normocephalic, sclera clear, mucus membranes moist Neck:supple, no JVD, no bruits  Heart:S1S2 RRR without murmur, gallup, rub or click occ. Premature beat Lungs:clear without rales, rhonchi, or wheezes QRF:XJOI, non tender, + BS, do not palpate liver spleen or masses Ext:no lower ext edema, 2+ pedal pulses, 2+ radial pulses Neuro:alert and oriented X 3, MAE, follows commands, + facial symmetry    EKG:  EKG is ordered today. The ekg ordered today demonstrates SR with PACs. No changes otherwise except for PACs.    Recent Labs: 03/07/2014: Magnesium 2.0 07/06/2014: ALT 14; Hemoglobin 15.9; Platelets 163.0; TSH 1.65 07/07/2014: BUN 21; Creatinine 1.13; Potassium 4.0; Sodium 136    Lipid Panel    Component Value Date/Time   CHOL 178 07/06/2014 0941   TRIG 192.0* 07/06/2014 0941   TRIG 162* 04/21/2006 0949   HDL 42.80 07/06/2014 0941   CHOLHDL 4 07/06/2014 0941   CHOLHDL 3.8 CALC 04/21/2006 0949   VLDL 38.4 07/06/2014 0941   LDLCALC 97 07/06/2014 0941   LDLDIRECT 84.9 03/01/2009 1214       Other studies Reviewed: Additional studies/ records that were reviewed today include: EKGs office notes.   ASSESSMENT AND PLAN:  1.  Palpitations- freq PACs today- other times he has PVCs.  He has baseline sinus brady I have asked the pt to take extra 12.5 mg atenolol today and every other day if needed for palpitations.  If they continue past Thursday he will call.  Recent labs were stable. Follow up with Dr. Adora Fridge in May.  2.  Hx bradycardia    Current medicines are reviewed with  the patient today.  The patient Has no concerns regarding medicines.  The following changes have been made:  See above Labs/ tests ordered today include:see above  Disposition:   FU:  see above  Lennie Muckle, NP  08/08/2014 4:49 PM    [facility] [address], [place], [place] [address], [place] Phone: [phone]; Fax: [phone]

## 2014-08-08 NOTE — Telephone Encounter (Signed)
Pt called in stating that the has been having some arrhymia issues and would like to speak with someone about it. I offered pt appt with [name] at 4:00 but he declined because he has to take his wife somewhere. Please call pt back  Thanks

## 2014-08-08 NOTE — Telephone Encounter (Signed)
Pt called back and wanted appt with [name]. So he is scheduled to see her today at 4:00pm

## 2014-08-08 NOTE — Patient Instructions (Signed)
You may take an extra 1/2 of a tablet of Atenolol every other day as needed. If you are still have the palpitations on Thursday, call the office and speak with the triage nurse.  Your physician recommends that you schedule a follow-up appointment in:May with Dr.Berry.

## 2014-08-08 NOTE — Telephone Encounter (Signed)
Returned call to patient he stated he has been having frequent irregular heart beat.Stated he called back scheduled appointment with Corey Kicks NP this afternoon at 4:00 pm.

## 2014-08-11 ENCOUNTER — Telehealth: Payer: Self-pay | Admitting: Cardiology

## 2014-08-11 DIAGNOSIS — I10 Essential (primary) hypertension: Secondary | ICD-10-CM

## 2014-08-11 DIAGNOSIS — I491 Atrial premature depolarization: Secondary | ICD-10-CM

## 2014-08-11 NOTE — Telephone Encounter (Signed)
Corey Fischer is calling because he was in on yesterday and was supposed to call about what he was told to do with his medication . Please call    Thanks

## 2014-08-11 NOTE — Telephone Encounter (Signed)
Spoke with pt, he took extra 1/2 of atenolol last night. He did sleep better but does not feel there has been a big change in the palpitations. His current pulse is 47. Will make laura ingold np aware.

## 2014-08-12 NOTE — Telephone Encounter (Signed)
Ok, let's check labs to be sure, BMP, TSH and Mg+ level.  If normal will discuss with Dr. Adora Fridge for further recommendations.

## 2014-08-12 NOTE — Telephone Encounter (Signed)
Returned call to patient Corey Kicks NP advised to have lab work bmet,tsh,magnesium.Stated will have done Monday 08/15/14.If normal she will discuss further recommendations with Dr.Berry.

## 2014-08-15 DIAGNOSIS — I10 Essential (primary) hypertension: Secondary | ICD-10-CM | POA: Diagnosis not present

## 2014-08-15 DIAGNOSIS — I491 Atrial premature depolarization: Secondary | ICD-10-CM | POA: Diagnosis not present

## 2014-08-15 LAB — BASIC METABOLIC PANEL
BUN: 14 mg/dL (ref 6–23)
CALCIUM: 9.2 mg/dL (ref 8.4–10.5)
CO2: 27 mEq/L (ref 19–32)
CREATININE: 1.04 mg/dL (ref 0.50–1.35)
Chloride: 104 mEq/L (ref 96–112)
Glucose, Bld: 79 mg/dL (ref 70–99)
Potassium: 4.7 mEq/L (ref 3.5–5.3)
Sodium: 138 mEq/L (ref 135–145)

## 2014-08-15 LAB — TSH: TSH: 1.98 u[IU]/mL (ref 0.350–4.500)

## 2014-08-15 LAB — MAGNESIUM: Magnesium: 2.2 mg/dL (ref 1.5–2.5)

## 2014-08-19 DIAGNOSIS — R972 Elevated prostate specific antigen [PSA]: Secondary | ICD-10-CM | POA: Diagnosis not present

## 2014-08-23 DIAGNOSIS — M25462 Effusion, left knee: Secondary | ICD-10-CM | POA: Diagnosis not present

## 2014-08-23 DIAGNOSIS — Z96642 Presence of left artificial hip joint: Secondary | ICD-10-CM | POA: Diagnosis not present

## 2014-08-23 DIAGNOSIS — M47898 Other spondylosis, sacral and sacrococcygeal region: Secondary | ICD-10-CM | POA: Diagnosis not present

## 2014-08-23 DIAGNOSIS — Z471 Aftercare following joint replacement surgery: Secondary | ICD-10-CM | POA: Diagnosis not present

## 2014-08-23 DIAGNOSIS — M179 Osteoarthritis of knee, unspecified: Secondary | ICD-10-CM | POA: Diagnosis not present

## 2014-08-23 DIAGNOSIS — M25552 Pain in left hip: Secondary | ICD-10-CM | POA: Diagnosis not present

## 2014-08-23 DIAGNOSIS — M25562 Pain in left knee: Secondary | ICD-10-CM | POA: Diagnosis not present

## 2014-08-23 DIAGNOSIS — M1611 Unilateral primary osteoarthritis, right hip: Secondary | ICD-10-CM | POA: Diagnosis not present

## 2014-08-25 ENCOUNTER — Telehealth: Payer: Self-pay | Admitting: Cardiovascular Disease

## 2014-08-25 NOTE — Telephone Encounter (Signed)
Pt said his surgeon wants him to take Celebrax 100 mg 2 times a day for 30 days. He wants to know if this is all right?

## 2014-08-25 NOTE — Telephone Encounter (Signed)
Any concern for drug interactions w/ Celebrex & his current meds?

## 2014-08-25 NOTE — Telephone Encounter (Signed)
Celebrex not a drug-drug interaction for patient.  However celebrex may increase risk of CV thrombosis (stroke, MI).  Would prefer not to use this or other NSAID unless no other options.  Can they try tramadol or low dose narcotic?  If need to use celebrex, please limit to once daily for up to 2 weeks.

## 2014-08-25 NOTE — Telephone Encounter (Signed)
Informed pt of pharmacy recommendations. He voiced understanding. Will confer w/ his surgeon. Advised them to contact us if further questions.

## 2014-08-26 ENCOUNTER — Other Ambulatory Visit: Payer: Self-pay | Admitting: *Deleted

## 2014-08-26 DIAGNOSIS — N401 Enlarged prostate with lower urinary tract symptoms: Secondary | ICD-10-CM | POA: Diagnosis not present

## 2014-08-26 DIAGNOSIS — N138 Other obstructive and reflux uropathy: Secondary | ICD-10-CM | POA: Diagnosis not present

## 2014-08-26 MED ORDER — FAMOTIDINE 40 MG PO TABS: 40.0000 mg | ORAL_TABLET | Freq: Two times a day (BID) | ORAL | Status: DC

## 2014-09-09 DIAGNOSIS — M1611 Unilateral primary osteoarthritis, right hip: Secondary | ICD-10-CM | POA: Diagnosis not present

## 2014-09-09 DIAGNOSIS — M7062 Trochanteric bursitis, left hip: Secondary | ICD-10-CM | POA: Diagnosis not present

## 2014-09-19 ENCOUNTER — Other Ambulatory Visit: Payer: Self-pay | Admitting: Physician Assistant

## 2014-10-04 ENCOUNTER — Ambulatory Visit (INDEPENDENT_AMBULATORY_CARE_PROVIDER_SITE_OTHER): Payer: Medicare Other | Admitting: Cardiovascular Disease

## 2014-10-04 ENCOUNTER — Encounter: Payer: Self-pay | Admitting: Cardiovascular Disease

## 2014-10-04 VITALS — BP 138/60 | HR 52 | Ht 67.75 in | Wt 152.8 lb

## 2014-10-04 DIAGNOSIS — E785 Hyperlipidemia, unspecified: Secondary | ICD-10-CM | POA: Diagnosis not present

## 2014-10-04 DIAGNOSIS — I10 Essential (primary) hypertension: Secondary | ICD-10-CM

## 2014-10-04 DIAGNOSIS — I6521 Occlusion and stenosis of right carotid artery: Secondary | ICD-10-CM | POA: Diagnosis not present

## 2014-10-04 NOTE — Patient Instructions (Signed)
Your physician wants you to follow-up in: 1 year with Dr Berry. You will receive a reminder letter in the mail two months in advance. If you don't receive a letter, please call our office to schedule the follow-up appointment.  

## 2014-10-04 NOTE — Assessment & Plan Note (Signed)
History of hypertension blood pressure measured at 138/60. He is on amlodipine and low-dose atenolol. Continue current meds at current dosing

## 2014-10-04 NOTE — Progress Notes (Signed)
10/04/2014 Corey Fischer   05/29/1931  [id number]  Primary Physician Corey Reddish, MD Primary Cardiologist: Corey Harp MD Corey Fischer    HPI: The patient is a delightful 81 year old thin and fit appearing married Caucasian male father of 2, grandfather to 4 grandchildren who was self-referred for evaluation of symptomatic PVCs.I last saw him in the office 04/01/14.  He is retired from working at [employer] 7 years ago. His risk factors are remarkable for treated hypertension and hyperlipidemia. He does not smoke and drinks socially. His family history is remarkable for a mother who had atrial fibrillation and a father who had angina, but no one has had documented ischemic heart disease. He has never had an MI or a stroke and denies chest pain or shortness of breath. His surgical history is remarkable for remote TURP, cholecystectomy, and hernia repair.  He has had PVCs since graduate school and was on propranolol remotely, prescribed by Corey Fischer. Over the last 2 months he has had progressive PVCs which he is more aware of and symptomatic from. He has cut out his caffeine. Lab work performed by Corey Fischer revealed a normal TSH.  He saw Corey Fischer registered nurse practitioner back at the end of May 2014 because of palpitations have gotten worse. He was started on Cardizem 30 mg by mouth twice a day and his simvastatin was decreased from 40-20 mg a day. The 2-D echo is essentially normal. He has been checking his blood pressures which are somewhat sporadic but averaging 1:30 to 407 range systolic.  We have stopped his diltiazem which would cause contributing to his Wenkebach and symptoms and changed him to amlodipine. He had no further symptoms. He denies chest pain or shortness of breath. His lipid profile was excellent on statin therapy last measured 05/07/13.  He saw Corey Fischer in our [facility] office 03/07/14 because of several days of symptom of  palpitations. His EKG showed PACs. He was initially begun on metoprolol and later switched to low-dose atenolol resulted in improvement in his symptoms.  Since being seen back in 6 months ago he's remained currently stable. He gets occasional episodes of symptomatic PVCs which are self-limited. He denies chest pain or shortness of breath. He does have moderate right internal carotid artery stenosis which we followed by duplex ultrasound.   Current Outpatient Prescriptions  Medication Sig Dispense Refill  . amLODipine (NORVASC) 2.5 MG tablet Take 1 tablet (2.5 mg total) by mouth at bedtime. (Patient taking differently: Take 2.5 mg by mouth daily. ) 90 tablet 1  . Ascorbic Acid (VITAMIN C) 1000 MG tablet Take 1,000 mg by mouth daily.     [name] aspirin 81 MG tablet Take 81 mg by mouth daily.    [name] atenolol (TENORMIN) 25 MG tablet Take 0.5 tablets (12.5 mg total) by mouth daily. 30 tablet 6  . cetirizine (ZYRTEC) 10 MG tablet Take 10 mg by mouth daily with breakfast.     . chlorpheniramine-HYDROcodone (TUSSIONEX) 10-8 MG/5ML LQCR Take 5 mLs by mouth every 12 (twelve) hours as needed for cough. 115 mL 0  . Cholecalciferol (VITAMIN D3) 1000 UNITS CAPS Take 2,000 Units by mouth daily.     . Coenzyme Q10 50 MG CAPS Take 50 mg by mouth daily.     . Cyanocobalamin 2500 MCG SUBL Place 2,500 mcg under the tongue daily.     . diazepam (VALIUM) 5 MG tablet Take 2.5-5 mg by mouth at bedtime as needed for anxiety  or sedation.    . famotidine (PEPCID) 40 MG tablet TAKE 1 TABLET BY MOUTH TWICE DAILY. 60 tablet 0  . Magnesium 200 MG TABS Take 200 mg by mouth daily.     . polyethylene glycol (MIRALAX / GLYCOLAX) packet Take 17 g by mouth daily.     . simvastatin (ZOCOR) 20 MG tablet Take 20 mg by mouth every morning.    . Zinc 25 MG TABS Take 25 mg by mouth daily.      No current facility-administered medications for this visit.    Allergies  Allergen Reactions  . Ciprofloxacin     Unknown reaction    History    Social History  . Marital Status: Married    Spouse Name: N/A  . Number of Children: 2  . Years of Education: N/A   Occupational History  . retired    Social History Main Topics  . Smoking status: Never Smoker   . Smokeless tobacco: Never Used  . Alcohol Use: Yes     Comment: occasionally  . Drug Use: No  . Sexual Activity: Yes   Other Topics Concern  . Not on file   Social History Narrative   Grew up in small town in [place]. 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 Met wife in [place]. At 66, got married and went to college-undergrad in [employer] as well as Oceanographer, doctorate in Location manager.  Worked at [employer] which eventually became [employer]. Lived in [place] and [place] before eventually moving to [place]. 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       Married over 50 years. 72 years 01/2014. 2 married children with 2 children each. 1 son near [place]. 1 son in [place] with house of rep.       Hobbies: work out most mornings 5 days a week (spin, elipitical), racquetball until 2014 after hip surgery.      Review of Systems: General: negative for chills, fever, night sweats or weight changes.  Cardiovascular: negative for chest pain, dyspnea on exertion, edema, orthopnea, palpitations, paroxysmal nocturnal dyspnea or shortness of breath Dermatological: negative for rash Respiratory: negative for cough or wheezing Urologic: negative for hematuria Abdominal: negative for nausea, vomiting, diarrhea, bright red blood per rectum, melena, or hematemesis Neurologic: negative for visual changes, syncope, or dizziness All other systems reviewed and are otherwise negative except as noted above.    Blood pressure 138/60, pulse 52, height 5' 7.75" (1.721 m), weight 152 lb 12.8 oz (69.31 kg).  General appearance: alert and no distress Neck: no adenopathy, no JVD, supple, symmetrical, trachea midline, thyroid not enlarged, symmetric, no  tenderness/mass/nodules and soft right carotid bruit Lungs: clear to auscultation bilaterally Heart: regular rate and rhythm, S1, S2 normal, no murmur, click, rub or gallop Extremities: extremities normal, atraumatic, no cyanosis or edema  EKG not performed today  ASSESSMENT AND PLAN:   Hyperlipidemia History of dyslipidemia on simvastatin 20 mg a day with recent lipid profile performed 07/06/14 revealed a total cholesterol 178, LDL 97 HDL of 42   Essential hypertension History of hypertension blood pressure measured at 138/60. He is on amlodipine and low-dose atenolol. Continue current meds at current dosing   Carotid artery stenosis History of carotid artery disease with recent Dopplers performed 11/30/13 revealing moderate right ICA stenosis. He is neurologically symptomatic and these are scheduled to be performed on annual basis       Corey Harp MD [facility], [facility] 10/04/2014 4:04  PM

## 2014-10-04 NOTE — Assessment & Plan Note (Signed)
History of carotid artery disease with recent Dopplers performed 11/30/13 revealing moderate right ICA stenosis. He is neurologically symptomatic and these are scheduled to be performed on annual basis

## 2014-10-04 NOTE — Assessment & Plan Note (Signed)
History of dyslipidemia on simvastatin 20 mg a day with recent lipid profile performed 07/06/14 revealed a total cholesterol 178, LDL 97 HDL of 42

## 2014-10-14 DIAGNOSIS — Z96642 Presence of left artificial hip joint: Secondary | ICD-10-CM | POA: Diagnosis not present

## 2014-10-14 DIAGNOSIS — M25562 Pain in left knee: Secondary | ICD-10-CM | POA: Diagnosis not present

## 2014-10-14 DIAGNOSIS — Z471 Aftercare following joint replacement surgery: Secondary | ICD-10-CM | POA: Diagnosis not present

## 2014-10-14 DIAGNOSIS — M25552 Pain in left hip: Secondary | ICD-10-CM | POA: Diagnosis not present

## 2014-10-25 DIAGNOSIS — Z96642 Presence of left artificial hip joint: Secondary | ICD-10-CM | POA: Diagnosis not present

## 2014-10-25 DIAGNOSIS — M25552 Pain in left hip: Secondary | ICD-10-CM | POA: Diagnosis not present

## 2014-11-23 ENCOUNTER — Other Ambulatory Visit: Payer: Self-pay | Admitting: Cardiovascular Disease

## 2014-11-23 DIAGNOSIS — I6523 Occlusion and stenosis of bilateral carotid arteries: Secondary | ICD-10-CM

## 2014-11-28 ENCOUNTER — Other Ambulatory Visit: Payer: Self-pay | Admitting: Family Medicine

## 2014-12-06 ENCOUNTER — Ambulatory Visit (HOSPITAL_COMMUNITY)
Admission: RE | Admit: 2014-12-06 | Discharge: 2014-12-06 | Disposition: A | Discharge status: Home / Self Care | Payer: Medicare Other | Source: Ambulatory Visit | Attending: Cardiovascular Disease | Admitting: Cardiovascular Disease

## 2014-12-06 DIAGNOSIS — I6523 Occlusion and stenosis of bilateral carotid arteries: Secondary | ICD-10-CM | POA: Diagnosis not present

## 2014-12-21 ENCOUNTER — Telehealth: Payer: Self-pay | Admitting: Cardiovascular Disease

## 2014-12-21 NOTE — Telephone Encounter (Signed)
Pt called in wanting to know the results to his Carotid doppler that he had on 7/19. He would like to know if there were any changes based on the dopplers he has had in the past. Please call  Thanks

## 2014-12-21 NOTE — Telephone Encounter (Signed)
Returned call to patient 12/01/14 carotid dopplers no change since prior study.Repeat in 1 year.Results were released to [facility].

## 2015-01-04 ENCOUNTER — Encounter: Payer: Self-pay | Admitting: Family Medicine

## 2015-01-04 ENCOUNTER — Ambulatory Visit (INDEPENDENT_AMBULATORY_CARE_PROVIDER_SITE_OTHER): Payer: Medicare Other | Admitting: Family Medicine

## 2015-01-04 VITALS — BP 128/70 | HR 110 | Temp 97.9°F | Ht 66.0 in | Wt 152.0 lb

## 2015-01-04 DIAGNOSIS — D473 Essential (hemorrhagic) thrombocythemia: Secondary | ICD-10-CM

## 2015-01-04 DIAGNOSIS — I10 Essential (primary) hypertension: Secondary | ICD-10-CM

## 2015-01-04 DIAGNOSIS — M150 Primary generalized (osteo)arthritis: Secondary | ICD-10-CM | POA: Diagnosis not present

## 2015-01-04 DIAGNOSIS — E785 Hyperlipidemia, unspecified: Secondary | ICD-10-CM | POA: Diagnosis not present

## 2015-01-04 DIAGNOSIS — M159 Polyosteoarthritis, unspecified: Secondary | ICD-10-CM

## 2015-01-04 NOTE — Patient Instructions (Signed)
Epic was not available for AVS at time of visit

## 2015-01-04 NOTE — Assessment & Plan Note (Addendum)
S:Idiopathic. Had been followed by hematology. No longer following. Asymptomatic. A/P: CBC in February showed no thrombocytopenia, follow at least yearly

## 2015-01-04 NOTE — Assessment & Plan Note (Signed)
S: L hip replacmenet 06/03/13. Has had continued issues with pain when he pushes off- after the release has moderate pain that then fades. Saw Dr. Mayer Camel back and had PT but did not help. Saw separate PT who recommended 2nd opinion. Saw [facility] Dr. Alphonzo Grieve who did an updated scan. Has had deep muscle massage as well and continuing this A/P: patient asks for my opinion and i advised to return to Dr. Mayer Camel to see if anything else can be done to help him improve his function.

## 2015-01-04 NOTE — Assessment & Plan Note (Signed)
S: controlled. On amlodipine and atenolol BP Readings from Last 3 Encounters:  01/04/15 128/70  10/04/14 138/60  08/08/14 116/60  A/P:Continue current meds

## 2015-01-04 NOTE — Progress Notes (Signed)
Corey Reddish, MD Phone: [phone]  Subjective:  Patient presents today for their annual physical (epic was down and could not tell at time was not eligible for physical-instead should have been AWV). Chief complaint-noted.   See problem oriented charting ROS- full  review of systems was completed and negative.  Pertinent positives: left hip pain, nocturia 1-2x a night. Palpitations followed by cards Pertinent negatives: no chest pain, shortness of breath  The following were reviewed and entered/updated in epic: Past Medical History  Diagnosis Date  . Chronic ITP (idiopathic thrombocytopenia)   . Hx of adenomatous colonic polyps 2010  . Diverticulosis   . GERD (gastroesophageal reflux disease)   . Allergy   . Hyperlipidemia   . Hypertension   . Elevated PSA   . Anxiety   . Depression   . Stricture and stenosis of esophagus 2010    last endoscopy- 2010  . Gastritis   . Hemorrhoids   . History of hiatal hernia   . Raynaud's syndrome   . Gastric polyp   . Asymptomatic bilateral carotid artery stenosis     a. Last duplex 11/2013 R bulb and ICA - 50-69%, R ECA >50%. L bulb and prox ICA: 0-49%. Repeat due 11/2014.  . Arthritis   . Osteoarthritis hips  . BPH (benign prostatic hyperplasia)   . B12 deficiency   . Palpitations   . Symptomatic PVCs     a. Per report, monitor 05/2012 - NSR with symptomatic PVC. b. Echo 05/2012- EF 55-60%, aortic sclerosis without stenosis. c. Nuc 12/2012 - normal.  . H/O hiatal hernia   . PAC (premature atrial contraction)     a. NSR/SB with 1st degree AVB and PACs/atrial bigeminy seen on event monitor 11/2012.  . Sinus bradycardia   . Wandering (atrial) pacemaker   . INTERNAL HEMORRHOIDS WITHOUT MENTION COMP 08/14/2007    Qualifier: Diagnosis of  By: Arnoldo Morale MD, Balinda Quails    Patient Active Problem List   Diagnosis Date Noted  . Hyperglycemia 07/06/2014    Priority: Medium  . Symptomatic PVCs     Priority: Medium  . Bradycardia 12/10/2012   Priority: Medium  . PAC (premature atrial contraction), symptomatic 10/16/2012    Priority: Medium  . BPH (benign prostatic hyperplasia) 07/22/2011    Priority: Medium  . THROMBOCYTHEMIA 04/21/2008    Priority: Medium  . Carotid artery stenosis 11/05/2007    Priority: Medium  . Essential hypertension 04/29/2007    Priority: Medium  . Hyperlipidemia 01/22/2007    Priority: Medium  . Anxiety state, unspecified 12/30/2013    Priority: Low  . Osteoarthritis 03/16/2010    Priority: Low  . Osteopenia 07/06/2009    Priority: Low  . Raynaud's syndrome 06/02/2009    Priority: Low  . Prostatitis 03/01/2009    Priority: Low  . CONSTIPATION, CHRONIC 07/28/2008    Priority: Low  . ESOPHAGEAL STRICTURE 03/04/2008    Priority: Low  . COLONIC POLYPS, HX OF 03/04/2008    Priority: Low  . Unspecified visual loss 10/30/2007    Priority: Low  . Vitamin B12 deficiency 01/26/2007    Priority: Low  . GERD 01/22/2007    Priority: Low   Past Surgical History  Procedure Laterality Date  . Rotator cuff repair Right 1987  . Tonsillectomy  age 39  . Appendectomy  age 25  . Laparoscopic inguinal hernia repair Bilateral 09-19-2010  . Left shoulder surg.  2006  . Cholecystectomy  1989  . Carotid duplex Bilateral 05-26-2007  . Transurethral resection  of prostate  07/22/2011    Procedure: TRANSURETHRAL RESECTION OF THE PROSTATE WITH GYRUS INSTRUMENTS;  Surgeon: Bernestine Amass, MD;  Location: [facility];  Service: Urology;  Laterality: N/A;  Saline Gyrus TURP   . Cardiac catheterization  1989    Told that he is "pristine "  . Total hip arthroplasty Left 05/31/2013    Procedure: TOTAL HIP ARTHROPLASTY;  Surgeon: Kerin Salen, MD;  Location: [facility];  Service: Orthopedics;  Laterality: Left;    Family History  Problem Relation Age of Onset  . Heart disease Mother   . Heart disease Father   . Colon cancer Neg Hx   . Colon polyps Mother   . Cancer Maternal Grandmother   . Heart  failure Maternal Grandfather   . Angina Father     Medications- reviewed and updated Current Outpatient Prescriptions  Medication Sig Dispense Refill  . amLODipine (NORVASC) 2.5 MG tablet TAKE 1 TABLET BY MOUTH AT BEDTIME 90 tablet 2  . Ascorbic Acid (VITAMIN C) 1000 MG tablet Take 1,000 mg by mouth daily.     [name] aspirin 81 MG tablet Take 81 mg by mouth daily.    [name] atenolol (TENORMIN) 25 MG tablet Take 0.5 tablets (12.5 mg total) by mouth daily. 30 tablet 6  . cetirizine (ZYRTEC) 10 MG tablet Take 10 mg by mouth daily with breakfast.     . Cholecalciferol (VITAMIN D3) 1000 UNITS CAPS Take 2,000 Units by mouth daily.     . Coenzyme Q10 50 MG CAPS Take 50 mg by mouth daily.     . Cyanocobalamin 2500 MCG SUBL Place 2,500 mcg under the tongue daily.     . famotidine (PEPCID) 40 MG tablet TAKE 1 TABLET BY MOUTH TWICE DAILY. 60 tablet 0  . Magnesium 200 MG TABS Take 200 mg by mouth daily.     . polyethylene glycol (MIRALAX / GLYCOLAX) packet Take 17 g by mouth daily.     . simvastatin (ZOCOR) 20 MG tablet Take 20 mg by mouth every morning.    . Zinc 25 MG TABS Take 25 mg by mouth daily.     . diazepam (VALIUM) 5 MG tablet Take 2.5-5 mg by mouth at bedtime as needed for anxiety or sedation.     No current facility-administered medications for this visit.    Allergies-reviewed and updated Allergies  Allergen Reactions  . Ciprofloxacin     Unknown reaction    Social History   Social History  . Marital Status: Married    Spouse Name: N/A  . Number of Children: 2  . Years of Education: N/A   Occupational History  . retired    Social History Main Topics  . Smoking status: Never Smoker   . Smokeless tobacco: Never Used  . Alcohol Use: Yes     Comment: occasionally  . Drug Use: No  . Sexual Activity: Yes   Other Topics Concern  . None   Social History Narrative   Grew up in small town in [place]. 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 Met  wife in [place]. At 73, got married and went to college-undergrad in [employer] as well as Oceanographer, doctorate in Location manager.  Worked at [employer] which eventually became [employer]. Lived in [place] [facility] and [place] before eventually moving to [place]. 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       Married over 50 years. 44 years 01/2014. 2 married children with  2 children each. 1 son near [place]. 1 son in [place] with house of rep.       Hobbies: work out most mornings 5 days a week (spin, elipitical), racquetball until 2014 after hip surgery.     ROS--See HPI   Objective: BP 128/70 mmHg  Pulse 110  Temp(Src) 97.9 F (36.6 C)  Ht _0  (1.676 m)  Wt 152 lb (68.947 kg)  BMI 24.55 kg/m2 Gen: NAD, resting comfortably HEENT: Mucous membranes are moist. Oropharynx normal Neck: no thyromegaly CV: RRR no murmurs rubs or gallops. On manual recheck- pulse not elevated.  Lungs: CTAB no crackles, wheeze, rhonchi Abdomen: soft/nontender/nondistended/normal bowel sounds. No rebound or guarding.  Rectal: normal tone, diffusely enlarged prostate, no masses or tenderness Ext: no edema Skin: warm, dry Neuro: grossly normal, moves all extremities, PERRLA   Assessment/Plan:  81 y.o. male presenting for annual physical.  Health Maintenance counseling: 1. Anticipatory guidance: Patient counseled regarding regular dental exams, wearing seatbelts, wearing sunscreen. Sees derm every 1-2 years. Skin exam completed today upper body.  2. Risk factor reduction:  Advised patient of need for regular exercise and diet rich and fruits and vegetables to reduce risk of heart attack and stroke. Exercises 5 days a week. Planning on seeing nutritionist 3. Immunizations/screenings/ancillary studies Health Maintenance Due  Topic Date Due  . INFLUENZA VACCINE - will get at a pharmacy in fall 12/19/2014  4. Colonoscopy 09/08/2008, 10 year repeat planned- will be close to 85 so GI will discuss likely 5. Despite advisement  against- patient wants to continue prostate cancer screening. He does have BPH with nocturia. Rectal exam stable. Can repeat PSA but advised against Lab Results  Component Value Date   PSA 3.69 07/19/2013   PSA 4.49* 05/07/2013   PSA 1.43 04/29/2011  Dr. Cy Blamer note states 2.4 in 08/2014- no repeat required.  6. Return for labs as system was down   Osteoarthritis S: L hip replacmenet 06/03/13. Has had continued issues with pain when he pushes off- after the release has moderate pain that then fades. Saw Dr. Mayer Camel back and had PT but did not help. Saw separate PT who recommended 2nd opinion. Saw [facility] Dr. Alphonzo Grieve who did an updated scan. Has had deep muscle massage as well and continuing this A/P: patient asks for my opinion and i advised to return to Dr. Mayer Camel to see if anything else can be done to help him improve his function.   THROMBOCYTHEMIA S:Idiopathic. Had been followed by hematology. No longer following. Asymptomatic. A/P: CBC in February showed no thrombocytopenia, follow at least yearly   Hyperlipidemia S: controlled at February check, compliant with simvastatin 14m Lab Results  Component Value Date   CHOL 178 07/06/2014   HDL 42.80 07/06/2014   LDLCALC 97 07/06/2014   LDLDIRECT 84.9 03/01/2009   TRIG 192.0* 07/06/2014   CHOLHDL 4 07/06/2014  A/P:Continue current meds:     Essential hypertension S: controlled. On amlodipine and atenolol BP Readings from Last 3 Encounters:  01/04/15 128/70  10/04/14 138/60  08/08/14 116/60  A/P:Continue current meds   6 month f/u. AWV 1 year.

## 2015-01-04 NOTE — Assessment & Plan Note (Signed)
S: controlled at February check, compliant with simvastatin 20mg  Lab Results  Component Value Date   CHOL 178 07/06/2014   HDL 42.80 07/06/2014   LDLCALC 97 07/06/2014   LDLDIRECT 84.9 03/01/2009   TRIG 192.0* 07/06/2014   CHOLHDL 4 07/06/2014  A/P:Continue current meds:

## 2015-01-11 ENCOUNTER — Encounter: Payer: Self-pay | Admitting: Family Medicine

## 2015-01-12 NOTE — Telephone Encounter (Signed)
Under hyperglycemia order a1c Under hypertension order CBC and CMET  Inform patient I am fine with a1c retest, help him get set up for lab. I would call him at risk for diabetes. i dont use the term prediabetes a lot. I don't think he has to have any of these tests at this time but it is reasonable to do them if he is interested

## 2015-01-13 ENCOUNTER — Other Ambulatory Visit (INDEPENDENT_AMBULATORY_CARE_PROVIDER_SITE_OTHER): Payer: Medicare Other

## 2015-01-13 ENCOUNTER — Telehealth: Payer: Self-pay

## 2015-01-13 DIAGNOSIS — I10 Essential (primary) hypertension: Secondary | ICD-10-CM

## 2015-01-13 DIAGNOSIS — R739 Hyperglycemia, unspecified: Secondary | ICD-10-CM

## 2015-01-13 LAB — CBC
HCT: 43.1 % (ref 39.0–52.0)
HEMOGLOBIN: 14.6 g/dL (ref 13.0–17.0)
MCHC: 33.8 g/dL (ref 30.0–36.0)
MCV: 88.8 fl (ref 78.0–100.0)
PLATELETS: 143 10*3/uL — AB (ref 150.0–400.0)
RBC: 4.86 Mil/uL (ref 4.22–5.81)
RDW: 13.5 % (ref 11.5–15.5)
WBC: 5.9 10*3/uL (ref 4.0–10.5)

## 2015-01-13 LAB — COMPREHENSIVE METABOLIC PANEL
ALK PHOS: 54 U/L (ref 39–117)
ALT: 17 U/L (ref 0–53)
AST: 17 U/L (ref 0–37)
Albumin: 4.1 g/dL (ref 3.5–5.2)
BILIRUBIN TOTAL: 0.5 mg/dL (ref 0.2–1.2)
BUN: 19 mg/dL (ref 6–23)
CALCIUM: 9.6 mg/dL (ref 8.4–10.5)
CO2: 30 meq/L (ref 19–32)
CREATININE: 1.2 mg/dL (ref 0.40–1.50)
Chloride: 102 mEq/L (ref 96–112)
GFR: 61.77 mL/min (ref >60.00)
Glucose, Bld: 98 mg/dL (ref 70–99)
Potassium: 4.6 mEq/L (ref 3.5–5.1)
Sodium: 137 mEq/L (ref 135–145)
TOTAL PROTEIN: 6.8 g/dL (ref 6.0–8.3)

## 2015-01-13 LAB — HEMOGLOBIN A1C: HEMOGLOBIN A1C: 5.9 % (ref 4.6–6.5)

## 2015-01-13 NOTE — Telephone Encounter (Signed)
Labs ordered.

## 2015-01-19 DIAGNOSIS — H43812 Vitreous degeneration, left eye: Secondary | ICD-10-CM | POA: Diagnosis not present

## 2015-01-19 DIAGNOSIS — G453 Amaurosis fugax: Secondary | ICD-10-CM | POA: Diagnosis not present

## 2015-02-23 DIAGNOSIS — Z96642 Presence of left artificial hip joint: Secondary | ICD-10-CM | POA: Diagnosis not present

## 2015-02-23 DIAGNOSIS — M7062 Trochanteric bursitis, left hip: Secondary | ICD-10-CM | POA: Diagnosis not present

## 2015-02-28 ENCOUNTER — Encounter: Payer: Self-pay | Admitting: Family Medicine

## 2015-02-28 ENCOUNTER — Ambulatory Visit (INDEPENDENT_AMBULATORY_CARE_PROVIDER_SITE_OTHER): Payer: Medicare Other | Admitting: Family Medicine

## 2015-02-28 ENCOUNTER — Telehealth: Payer: Self-pay | Admitting: Family Medicine

## 2015-02-28 VITALS — BP 130/60 | HR 49 | Temp 98.1°F | Wt 149.0 lb

## 2015-02-28 DIAGNOSIS — R1013 Epigastric pain: Secondary | ICD-10-CM | POA: Diagnosis not present

## 2015-02-28 DIAGNOSIS — Z23 Encounter for immunization: Secondary | ICD-10-CM

## 2015-02-28 LAB — COMPREHENSIVE METABOLIC PANEL
ALBUMIN: 4 g/dL (ref 3.5–5.2)
ALK PHOS: 48 U/L (ref 39–117)
ALT: 19 U/L (ref 0–53)
AST: 15 U/L (ref 0–37)
BILIRUBIN TOTAL: 0.7 mg/dL (ref 0.2–1.2)
BUN: 17 mg/dL (ref 6–23)
CALCIUM: 9.9 mg/dL (ref 8.4–10.5)
CHLORIDE: 103 meq/L (ref 96–112)
CO2: 28 mEq/L (ref 19–32)
CREATININE: 1.07 mg/dL (ref 0.40–1.50)
GFR: 70.48 mL/min (ref >60.00)
Glucose, Bld: 93 mg/dL (ref 70–99)
Potassium: 5.7 mEq/L — ABNORMAL HIGH (ref 3.5–5.1)
Sodium: 137 mEq/L (ref 135–145)
TOTAL PROTEIN: 6.9 g/dL (ref 6.0–8.3)

## 2015-02-28 LAB — AMYLASE: AMYLASE: 302 U/L — AB (ref 27–131)

## 2015-02-28 LAB — CBC WITH DIFFERENTIAL/PLATELET
BASOS PCT: 0.3 % (ref 0.0–3.0)
Basophils Absolute: 0 10*3/uL (ref 0.0–0.1)
EOS ABS: 0.2 10*3/uL (ref 0.0–0.7)
EOS PCT: 2.3 % (ref 0.0–5.0)
HEMATOCRIT: 47 % (ref 39.0–52.0)
HEMOGLOBIN: 15.9 g/dL (ref 13.0–17.0)
LYMPHS PCT: 18.6 % (ref 12.0–46.0)
Lymphs Abs: 1.7 10*3/uL (ref 0.7–4.0)
MCHC: 33.7 g/dL (ref 30.0–36.0)
MCV: 88.8 fl (ref 78.0–100.0)
Monocytes Absolute: 0.9 10*3/uL (ref 0.1–1.0)
Monocytes Relative: 9.7 % (ref 3.0–12.0)
Neutro Abs: 6.5 10*3/uL (ref 1.4–7.7)
Neutrophils Relative %: 69.1 % (ref 43.0–77.0)
Platelets: 169 10*3/uL (ref 150.0–400.0)
RBC: 5.29 Mil/uL (ref 4.22–5.81)
RDW: 13.6 % (ref 11.5–15.5)
WBC: 9.4 10*3/uL (ref 4.0–10.5)

## 2015-02-28 LAB — LIPASE: LIPASE: 361 U/L — AB (ref 11.0–59.0)

## 2015-02-28 NOTE — Telephone Encounter (Signed)
See below, was this discussed at [facility] today?

## 2015-02-28 NOTE — Telephone Encounter (Signed)
Strongly suspect this was listed on wrong patient. Please clarify with [facility]- not with patient- Corey Fischer please call. [name] is not in hospital  [place]- just FYI- not even clear if this is for one of our patients or not

## 2015-02-28 NOTE — Telephone Encounter (Signed)
Corey Fischer from [facility] called to ask for orders for  home hospice She is expected to discharged from [facility] today   [phone]

## 2015-02-28 NOTE — Patient Instructions (Addendum)
Flu shot received today.  Get labs- look for infection, liver issues, pancreatitis  If negative- we will trial PPI like omeprazole or nexium for potential ulcer.   If no improvement or worsening on that, would get CT abdomen/pelvis

## 2015-02-28 NOTE — Progress Notes (Signed)
Garret Reddish, MD  Subjective:  Corey Fischer is a 81 y.o. year old very pleasant male patient who presents with:  Abdominal pain (epigastric on exam, reported diffuse) -Mid Sunday morning diffue pain- thought it was gas. Sunday afternoon increased in intensity. Diffuse cramping pain- feels like it pulses with blood flow. Sharp but constant. Similar issue 3-4 weeks ago and went away after 2-3 days. Called Dr. Henrene Pastor and was set up with Nicoletta Ba but still 2 weeks away from that appointment. Trouble sleeping. Has not eaten since Sunday evening except liquids. Able to stay tolerated. Worked out yesterday morning and seemed to get mind off of it. No gallbladder- s/p cholecystectomy. Mild nausea. S/p appendectomy. 7-8/10 at worst. Gas x has not helped. Association with food unclear.   Hx diverticulosis Pancreatitis 2011- mild pancreatitis EGD later that year was normal  ROS- no vomiting. No fever or chills. No constipation- daily BM on miralax, no diarrhea.   Past Medical History- carotid artery stenosis, thromboyctopenia, PAC and PVCs, HLD, HTN, hyperglycemia, BPH and history prostatitis  Medications- reviewed and updated Current Outpatient Prescriptions  Medication Sig Dispense Refill  . amLODipine (NORVASC) 2.5 MG tablet TAKE 1 TABLET BY MOUTH AT BEDTIME 90 tablet 2  . Ascorbic Acid (VITAMIN C) 1000 MG tablet Take 1,000 mg by mouth daily.     [name] aspirin 81 MG tablet Take 81 mg by mouth daily.    [name] atenolol (TENORMIN) 25 MG tablet Take 0.5 tablets (12.5 mg total) by mouth daily. 30 tablet 6  . cetirizine (ZYRTEC) 10 MG tablet Take 10 mg by mouth daily with breakfast.     . Cholecalciferol (VITAMIN D3) 1000 UNITS CAPS Take 2,000 Units by mouth daily.     . Coenzyme Q10 50 MG CAPS Take 50 mg by mouth daily.     . Cyanocobalamin 2500 MCG SUBL Place 2,500 mcg under the tongue daily.     . famotidine (PEPCID) 40 MG tablet TAKE 1 TABLET BY MOUTH TWICE DAILY. 60 tablet 0  . Magnesium 200 MG TABS  Take 200 mg by mouth daily.     . polyethylene glycol (MIRALAX / GLYCOLAX) packet Take 17 g by mouth daily.     . simvastatin (ZOCOR) 20 MG tablet Take 20 mg by mouth every morning.    . Zinc 25 MG TABS Take 25 mg by mouth daily.     . diazepam (VALIUM) 5 MG tablet Take 2.5-5 mg by mouth at bedtime as needed for anxiety or sedation.     No current facility-administered medications for this visit.    Objective: BP 130/60 mmHg  Pulse 49  Temp(Src) 98.1 F (36.7 C)  Wt 149 lb (67.586 kg) Gen: NAD, resting comfortably Mildly dry mucus membranes CV: RRR no murmurs rubs or gallops Lungs: CTAB no crackles, wheeze, rhonchi Abdomen: soft/moderate tenderness epigastric area/nondistended/normal bowel sounds. No rebound or guarding.  Ext: no edema Skin: warm, dry Neuro: grossly normal, moves all extremities   Assessment/Plan:  Abdominal pain (epigastric on exam, reported diffuse) Pancreatitis vs. Gastric ulcer higest on differential No warning flags on exam so no emergent imaging needed- no gallbladder or appendix Check labs- patient does not want to start PPI BID unless no signs pancreatitis Push fluids If labs unremarkable and PPI doesn't improve pain within a week- consider ct abd/pelvis Has GI follow up within 2 weeks.   Strict Return precautions advised.   Orders Placed This Encounter  Procedures  . Flu Vaccine QUAD 36+ mos IM  .  CBC with Differential/Platelet  . Comprehensive metabolic panel    [facility]  . Lipase  . Amylase

## 2015-03-01 ENCOUNTER — Encounter: Payer: Self-pay | Admitting: Family Medicine

## 2015-03-01 ENCOUNTER — Telehealth: Payer: Self-pay

## 2015-03-01 NOTE — Telephone Encounter (Signed)
Pt has been scheduled .[name] Ck

## 2015-03-01 NOTE — Telephone Encounter (Signed)
Error .. Info not for this pt

## 2015-03-01 NOTE — Telephone Encounter (Signed)
Received fax from [facility] and requires pt to have a CPE. Pt has not had a CPE since 2014 and does not have one scheduled. Please contact pt to schedule CPE in order for form to be filled out by Dr. Yong Channel.

## 2015-03-03 ENCOUNTER — Telehealth: Payer: Self-pay | Admitting: Family Medicine

## 2015-03-03 NOTE — Telephone Encounter (Signed)
Pt was seen on 02-28-15  Dx abd pain and requesting dr hunter to return his call today. Pt thinks symptoms are returning

## 2015-03-03 NOTE — Telephone Encounter (Signed)
Patient had slight amount of pain this morning now gone- had several other questions about advancing diet. Told him to go slow and go bland when he does

## 2015-03-13 ENCOUNTER — Other Ambulatory Visit (INDEPENDENT_AMBULATORY_CARE_PROVIDER_SITE_OTHER): Payer: Medicare Other

## 2015-03-13 ENCOUNTER — Encounter: Payer: Self-pay | Admitting: Physician Assistant

## 2015-03-13 ENCOUNTER — Ambulatory Visit (INDEPENDENT_AMBULATORY_CARE_PROVIDER_SITE_OTHER): Payer: Medicare Other | Admitting: Physician Assistant

## 2015-03-13 VITALS — BP 110/62 | HR 56 | Ht 66.75 in | Wt 147.0 lb

## 2015-03-13 DIAGNOSIS — K859 Acute pancreatitis without necrosis or infection, unspecified: Secondary | ICD-10-CM

## 2015-03-13 DIAGNOSIS — G8929 Other chronic pain: Secondary | ICD-10-CM | POA: Diagnosis not present

## 2015-03-13 DIAGNOSIS — I6523 Occlusion and stenosis of bilateral carotid arteries: Secondary | ICD-10-CM | POA: Diagnosis not present

## 2015-03-13 DIAGNOSIS — K861 Other chronic pancreatitis: Secondary | ICD-10-CM

## 2015-03-13 DIAGNOSIS — R1013 Epigastric pain: Secondary | ICD-10-CM | POA: Diagnosis not present

## 2015-03-13 LAB — COMPREHENSIVE METABOLIC PANEL
ALK PHOS: 59 U/L (ref 39–117)
ALT: 14 U/L (ref 0–53)
AST: 14 U/L (ref 0–37)
Albumin: 4 g/dL (ref 3.5–5.2)
BILIRUBIN TOTAL: 0.4 mg/dL (ref 0.2–1.2)
BUN: 16 mg/dL (ref 6–23)
CALCIUM: 9.9 mg/dL (ref 8.4–10.5)
CO2: 30 mEq/L (ref 19–32)
Chloride: 103 mEq/L (ref 96–112)
Creatinine, Ser: 1.14 mg/dL (ref 0.40–1.50)
GFR: 65.51 mL/min (ref >60.00)
GLUCOSE: 85 mg/dL (ref 70–99)
Potassium: 4.9 mEq/L (ref 3.5–5.1)
Sodium: 138 mEq/L (ref 135–145)
TOTAL PROTEIN: 6.9 g/dL (ref 6.0–8.3)

## 2015-03-13 LAB — AMYLASE: Amylase: 60 U/L (ref 27–131)

## 2015-03-13 LAB — LIPASE: Lipase: 29 U/L (ref 11.0–59.0)

## 2015-03-13 NOTE — Progress Notes (Signed)
Patient ID: PAULMICHAEL Fischer, male   DOB: 10-Mar-1932, 81 y.o.   MRN: [id number]   Subjective:    Patient ID: Corey Fischer, male    DOB: September 09, 1931, 81 y.o.   MRN: [id number]  HPI  Corey Fischer is a pleasant 81 year old white male known to Dr. Scarlette Shorts. He is referred today by his PCP Dr. Yong Channel for evaluation of acute abdominal pain. He was last seen here in March 2016. He has history of hypertension hyperlipidemia carotid stenosis, chronic GERD, and prior history of pancreatitis. He had undergone EUS and EGD in 2012 per Dr. Ardis Hughs and this was a normal exam. Last colonoscopy in April 2010 with 2 polyps found and removed and these were hyperplastic and noted to have one diverticulum at the hepatic flexure.Cholecystectomy and appendectomy. He reports acute onset of upper abdominal pain in early September. He said he called here and "got nowhere". He eventually called his PCP. Says that attack only lasted a day or 2 and resolved and then he had a second episode a few weeks later more intense and lasted for about a week. He says he hurt all across his upper abdomen and the pain was constant for several days there was no associated back pain, no nausea or vomiting no fever chills diarrhea etc. He says he is over the episode at this time and feels much better. He was seen by Dr. Yong Channel on 02/28/2015 CBC was within normal limits, liver function studies normal, lipase was elevated at 361 and amylase elevated at 302. His simvastatin was stopped and he was placed on a liquid diet for several days with gradual advancement. He says his back to eating normal food at this point and has no residual abdominal pain. He is concerned about what caused the episode of pancreatitis. There he should resume simvastatin. He continues to have some heartburn and indigestion which increased around the time of the pancreatitis. He is currently on Pepcid 40 mg twice a day. He also wishes not to take MiraLAX on a daily basis and asks about anything  else he can do from a dietary standpoint. He does not drink any regular EtOH.  Review of Systems Pertinent positive and negative review of systems were noted in the above HPI section.  All other review of systems was otherwise negative.  Outpatient Encounter Prescriptions as of 03/13/2015  Medication Sig  . amLODipine (NORVASC) 2.5 MG tablet TAKE 1 TABLET BY MOUTH AT BEDTIME  . Ascorbic Acid (VITAMIN C) 1000 MG tablet Take 1,000 mg by mouth daily.   [name] aspirin 81 MG tablet Take 81 mg by mouth daily.  [name] atenolol (TENORMIN) 25 MG tablet Take 0.5 tablets (12.5 mg total) by mouth daily.  . cetirizine (ZYRTEC) 10 MG tablet Take 10 mg by mouth daily with breakfast.   . Cholecalciferol (VITAMIN D3) 1000 UNITS CAPS Take 2,000 Units by mouth daily.   . Coenzyme Q10 50 MG CAPS Take 250 mg by mouth daily.   . Cyanocobalamin 2500 MCG SUBL Place 2,500 mcg under the tongue daily.   . diazepam (VALIUM) 5 MG tablet Take 2.5-5 mg by mouth at bedtime as needed for anxiety or sedation.  . famotidine (PEPCID) 40 MG tablet TAKE 1 TABLET BY MOUTH TWICE DAILY.  . Magnesium 200 MG TABS Take 200 mg by mouth daily.   . polyethylene glycol (MIRALAX / GLYCOLAX) packet Take 17 g by mouth daily.   . simvastatin (ZOCOR) 20 MG tablet Take 20 mg by mouth every morning.  [name]  Zinc 25 MG TABS Take 25 mg by mouth daily.    No facility-administered encounter medications on file as of 03/13/2015.   Allergies  Allergen Reactions  . Ciprofloxacin     Unknown reaction   Patient Active Problem List   Diagnosis Date Noted  . Hyperglycemia 07/06/2014  . Symptomatic PVCs   . Anxiety state, unspecified 12/30/2013  . Bradycardia 12/10/2012  . PAC (premature atrial contraction), symptomatic 10/16/2012  . BPH (benign prostatic hyperplasia) 07/22/2011  . Osteoarthritis 03/16/2010  . Osteopenia 07/06/2009  . Raynaud's syndrome 06/02/2009  . Prostatitis 03/01/2009  . CONSTIPATION, CHRONIC 07/28/2008  . THROMBOCYTHEMIA 04/21/2008    . ESOPHAGEAL STRICTURE 03/04/2008  . COLONIC POLYPS, HX OF 03/04/2008  . Carotid artery stenosis 11/05/2007  . Unspecified visual loss 10/30/2007  . Essential hypertension 04/29/2007  . Vitamin B12 deficiency 01/26/2007  . Hyperlipidemia 01/22/2007  . GERD 01/22/2007   Social History   Social History  . Marital Status: Married    Spouse Name: N/A  . Number of Children: 2  . Years of Education: N/A   Occupational History  . retired    Social History Main Topics  . Smoking status: Never Smoker   . Smokeless tobacco: Never Used  . Alcohol Use: Yes     Comment: occasionally  . Drug Use: No  . Sexual Activity: Yes   Other Topics Concern  . Not on file   Social History Narrative   Grew up in [place] town in [place]. 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 Met wife in [place]. At 72, got married and went to college-undergrad in [employer] as well as Oceanographer, doctorate in Location manager.  Worked at [employer] which eventually became [employer]. Lived in [facility] and [place] before eventually moving to [place]. 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       Married over 50 years. 3 years 01/2014. 2 married children with 2 children each. 1 son near [place]. 1 son in [place] with house of rep.       Hobbies: work out most mornings 5 days a week (spin, elipitical), racquetball until 2014 after hip surgery.     Mr. Moser family history includes Angina in his father; Cancer in his maternal grandmother; Colon polyps in his mother; Heart disease in his father and mother; Heart failure in his maternal grandfather. There is no history of Colon cancer.      Objective:    Filed Vitals:   03/13/15 0935  BP: 110/62  Pulse: 56    Physical Exam  well-developed thin older white male in no acute distress, appears younger than stated age accompanied by his wife blood pressure 110/62 pulse 56 height 5 foot 6 weight 147. HEENT; nontraumatic normocephalic EOMI  PERRLA sclera anicteric, Supple; no JVD, Cardiovascular; regular rate and rhythm with S1-S2 no murmur or gallop, Pulmonary; clear, Abdomen; soft basically nontender there is no palpable mass or hepatosplenomegaly bowel sounds are present, cholecystectomy scar, Rectal ;exam not done, Extremities; no clubbing cyanosis or edema skin warm and dry, Neuropsych; mood and affect appropriate       Assessment & Plan:   #1 81 yo male with recurrent pancreatitis of unclear etiology- most recent episode about 2 weeks ago , resolved S/P cholecystectomy, negative EUS 06/2010- ?med induced -statin d/c'd R/o malignancy  #2 Chronic GERD #3 chronic constipation  #4 HTN  Plan; repeat labs today  Schedule for MRI Abd/MRCP Low fat diet Stay off statin Discussed  high fiber diet , adding benefiber, figs ,prunes ,liberal water intake to help manage constipation- continue Miralax as needed.  follow up Dr Henrene Pastor  One month    Demoni Parmar S Teandre Hamre PA-C 03/13/2015   Cc: Marin Olp, MD

## 2015-03-13 NOTE — Progress Notes (Signed)
Agree 

## 2015-03-13 NOTE — Patient Instructions (Signed)
Please go to the basement level to have your labs drawn.  Continue the pepcid 40 mg twice daily.  Stay off the Simvastatin.   You have been scheduled for an MRI at  [facility], [address] on Monday 03-20-2015. Your appointment time is  8:00 am . Please arrive 15 minutes prior to your appointment time for registration purposes. Please make certain not to have anything to eat or drink 4 hours prior to your test. In addition, if you have any metal in your body, have a pacemaker or defibrillator, please be sure to let your ordering physician know. This test typically takes 45 minutes to 1 hour to complete.

## 2015-03-16 ENCOUNTER — Encounter: Payer: Self-pay | Admitting: Cardiovascular Disease

## 2015-03-16 ENCOUNTER — Encounter: Payer: Self-pay | Admitting: Family Medicine

## 2015-03-19 ENCOUNTER — Encounter (HOSPITAL_COMMUNITY): Payer: Self-pay

## 2015-03-19 ENCOUNTER — Telehealth: Payer: Self-pay | Admitting: Internal Medicine

## 2015-03-19 ENCOUNTER — Observation Stay (HOSPITAL_COMMUNITY)
Admission: EM | Admit: 2015-03-19 | Discharge: 2015-03-22 | Disposition: A | Discharge status: Home / Self Care | Payer: Medicare Other | Attending: Internal Medicine | Admitting: Internal Medicine

## 2015-03-19 DIAGNOSIS — R110 Nausea: Secondary | ICD-10-CM | POA: Diagnosis present

## 2015-03-19 DIAGNOSIS — K5730 Diverticulosis of large intestine without perforation or abscess without bleeding: Secondary | ICD-10-CM | POA: Diagnosis not present

## 2015-03-19 DIAGNOSIS — E785 Hyperlipidemia, unspecified: Secondary | ICD-10-CM | POA: Diagnosis not present

## 2015-03-19 DIAGNOSIS — I700 Atherosclerosis of aorta: Secondary | ICD-10-CM | POA: Insufficient documentation

## 2015-03-19 DIAGNOSIS — K222 Esophageal obstruction: Secondary | ICD-10-CM | POA: Diagnosis not present

## 2015-03-19 DIAGNOSIS — I70209 Unspecified atherosclerosis of native arteries of extremities, unspecified extremity: Secondary | ICD-10-CM | POA: Diagnosis not present

## 2015-03-19 DIAGNOSIS — R1310 Dysphagia, unspecified: Secondary | ICD-10-CM | POA: Insufficient documentation

## 2015-03-19 DIAGNOSIS — I10 Essential (primary) hypertension: Secondary | ICD-10-CM | POA: Diagnosis not present

## 2015-03-19 DIAGNOSIS — R140 Abdominal distension (gaseous): Secondary | ICD-10-CM | POA: Insufficient documentation

## 2015-03-19 DIAGNOSIS — K648 Other hemorrhoids: Secondary | ICD-10-CM | POA: Insufficient documentation

## 2015-03-19 DIAGNOSIS — Z7982 Long term (current) use of aspirin: Secondary | ICD-10-CM | POA: Diagnosis not present

## 2015-03-19 DIAGNOSIS — Z79899 Other long term (current) drug therapy: Secondary | ICD-10-CM | POA: Insufficient documentation

## 2015-03-19 DIAGNOSIS — I493 Ventricular premature depolarization: Secondary | ICD-10-CM | POA: Diagnosis not present

## 2015-03-19 DIAGNOSIS — K219 Gastro-esophageal reflux disease without esophagitis: Secondary | ICD-10-CM | POA: Insufficient documentation

## 2015-03-19 DIAGNOSIS — E871 Hypo-osmolality and hyponatremia: Secondary | ICD-10-CM | POA: Insufficient documentation

## 2015-03-19 DIAGNOSIS — D693 Immune thrombocytopenic purpura: Secondary | ICD-10-CM | POA: Diagnosis not present

## 2015-03-19 DIAGNOSIS — Z96642 Presence of left artificial hip joint: Secondary | ICD-10-CM | POA: Insufficient documentation

## 2015-03-19 DIAGNOSIS — N400 Enlarged prostate without lower urinary tract symptoms: Secondary | ICD-10-CM | POA: Diagnosis not present

## 2015-03-19 DIAGNOSIS — M1990 Unspecified osteoarthritis, unspecified site: Secondary | ICD-10-CM | POA: Diagnosis not present

## 2015-03-19 DIAGNOSIS — D124 Benign neoplasm of descending colon: Secondary | ICD-10-CM | POA: Diagnosis not present

## 2015-03-19 DIAGNOSIS — R1032 Left lower quadrant pain: Principal | ICD-10-CM | POA: Insufficient documentation

## 2015-03-19 DIAGNOSIS — Z950 Presence of cardiac pacemaker: Secondary | ICD-10-CM | POA: Insufficient documentation

## 2015-03-19 DIAGNOSIS — I6523 Occlusion and stenosis of bilateral carotid arteries: Secondary | ICD-10-CM | POA: Insufficient documentation

## 2015-03-19 DIAGNOSIS — K5900 Constipation, unspecified: Secondary | ICD-10-CM | POA: Diagnosis not present

## 2015-03-19 DIAGNOSIS — R112 Nausea with vomiting, unspecified: Secondary | ICD-10-CM | POA: Insufficient documentation

## 2015-03-19 DIAGNOSIS — K449 Diaphragmatic hernia without obstruction or gangrene: Secondary | ICD-10-CM | POA: Diagnosis not present

## 2015-03-19 DIAGNOSIS — I491 Atrial premature depolarization: Secondary | ICD-10-CM | POA: Diagnosis not present

## 2015-03-19 DIAGNOSIS — R109 Unspecified abdominal pain: Secondary | ICD-10-CM

## 2015-03-19 DIAGNOSIS — K625 Hemorrhage of anus and rectum: Secondary | ICD-10-CM | POA: Insufficient documentation

## 2015-03-19 DIAGNOSIS — R1013 Epigastric pain: Secondary | ICD-10-CM | POA: Diagnosis not present

## 2015-03-19 HISTORY — DX: Unspecified abdominal pain: R10.9

## 2015-03-19 LAB — URINALYSIS, ROUTINE W REFLEX MICROSCOPIC
BILIRUBIN URINE: NEGATIVE
Glucose, UA: NEGATIVE mg/dL
Hgb urine dipstick: NEGATIVE
Ketones, ur: NEGATIVE mg/dL
Leukocytes, UA: NEGATIVE
NITRITE: NEGATIVE
PROTEIN: 30 mg/dL — AB
SPECIFIC GRAVITY, URINE: 1.014 (ref 1.005–1.030)
UROBILINOGEN UA: 0.2 mg/dL (ref 0.0–1.0)
pH: 8.5 — ABNORMAL HIGH (ref 5.0–8.0)

## 2015-03-19 LAB — COMPREHENSIVE METABOLIC PANEL
ALK PHOS: 59 U/L (ref 38–126)
ALT: 17 U/L (ref 17–63)
ANION GAP: 11 (ref 5–15)
AST: 23 U/L (ref 15–41)
Albumin: 4.5 g/dL (ref 3.5–5.0)
BILIRUBIN TOTAL: 0.9 mg/dL (ref 0.3–1.2)
BUN: 13 mg/dL (ref 6–20)
CALCIUM: 10.3 mg/dL (ref 8.9–10.3)
CO2: 27 mmol/L (ref 22–32)
Chloride: 96 mmol/L — ABNORMAL LOW (ref 101–111)
Creatinine, Ser: 1.18 mg/dL (ref 0.61–1.24)
GFR, EST NON AFRICAN AMERICAN: 56 mL/min — AB (ref >60)
Glucose, Bld: 165 mg/dL — ABNORMAL HIGH (ref 65–99)
POTASSIUM: 4.1 mmol/L (ref 3.5–5.1)
Sodium: 134 mmol/L — ABNORMAL LOW (ref 135–145)
TOTAL PROTEIN: 7.6 g/dL (ref 6.5–8.1)

## 2015-03-19 LAB — CBC
HEMATOCRIT: 47.1 % (ref 39.0–52.0)
HEMOGLOBIN: 16.7 g/dL (ref 13.0–17.0)
MCH: 30.5 pg (ref 26.0–34.0)
MCHC: 35.5 g/dL (ref 30.0–36.0)
MCV: 86.1 fL (ref 78.0–100.0)
Platelets: 159 10*3/uL (ref 150–400)
RBC: 5.47 MIL/uL (ref 4.22–5.81)
RDW: 12.9 % (ref 11.5–15.5)
WBC: 7.4 10*3/uL (ref 4.0–10.5)

## 2015-03-19 LAB — LIPASE, BLOOD: Lipase: 30 U/L (ref 11–51)

## 2015-03-19 LAB — URINE MICROSCOPIC-ADD ON

## 2015-03-19 MED ORDER — FENTANYL CITRATE (PF) 100 MCG/2ML IJ SOLN
50.0000 ug | Freq: Once | INTRAMUSCULAR | Status: AC
Start: 2015-03-19 — End: 2015-03-19
  Administered 2015-03-19: 50 ug via INTRAVENOUS
  Filled 2015-03-19: qty 2

## 2015-03-19 MED ORDER — ONDANSETRON HCL 4 MG PO TABS: 4.0000 mg | ORAL_TABLET | Freq: Four times a day (QID) | ORAL | Status: DC | PRN

## 2015-03-19 MED ORDER — ONDANSETRON HCL 4 MG/2ML IJ SOLN
4.0000 mg | Freq: Four times a day (QID) | INTRAMUSCULAR | Status: DC | PRN
  Administered 2015-03-19: 4 mg via INTRAVENOUS
  Filled 2015-03-19: qty 2

## 2015-03-19 MED ORDER — ONDANSETRON HCL 4 MG/2ML IJ SOLN
4.0000 mg | Freq: Once | INTRAMUSCULAR | Status: AC
  Administered 2015-03-19: 4 mg via INTRAVENOUS
  Filled 2015-03-19: qty 2

## 2015-03-19 MED ORDER — GI COCKTAIL ~~LOC~~
30.0000 mL | Freq: Once | ORAL | Status: AC
  Administered 2015-03-19: 30 mL via ORAL
  Filled 2015-03-19: qty 30

## 2015-03-19 MED ORDER — ENOXAPARIN SODIUM 40 MG/0.4ML ~~LOC~~ SOLN
40.0000 mg | SUBCUTANEOUS | Status: DC
  Administered 2015-03-19 – 2015-03-20 (×2): 40 mg via SUBCUTANEOUS
  Filled 2015-03-19 (×2): qty 0.4

## 2015-03-19 MED ORDER — ATENOLOL 25 MG PO TABS
12.5000 mg | ORAL_TABLET | Freq: Every day | ORAL | Status: DC
  Administered 2015-03-20 – 2015-03-22 (×3): 12.5 mg via ORAL
  Filled 2015-03-19 (×3): qty 1

## 2015-03-19 MED ORDER — SODIUM CHLORIDE 0.9 % IV SOLN
INTRAVENOUS | Status: DC
  Administered 2015-03-21: 05:00:00 via INTRAVENOUS

## 2015-03-19 MED ORDER — MORPHINE SULFATE (PF) 2 MG/ML IV SOLN
1.0000 mg | INTRAVENOUS | Status: DC | PRN
  Administered 2015-03-20: 2 mg via INTRAVENOUS
  Filled 2015-03-19: qty 1

## 2015-03-19 MED ORDER — HYDROCORTISONE ACE-PRAMOXINE 2.5-1 % RE CREA
1.0000 "application " | TOPICAL_CREAM | Freq: Three times a day (TID) | RECTAL | Status: DC
  Administered 2015-03-21: 1 via RECTAL
  Filled 2015-03-19: qty 30

## 2015-03-19 MED ORDER — SODIUM CHLORIDE 0.9 % IV BOLUS (SEPSIS)
1000.0000 mL | Freq: Once | INTRAVENOUS | Status: AC
  Administered 2015-03-19: 1000 mL via INTRAVENOUS

## 2015-03-19 MED ORDER — PROMETHAZINE HCL 25 MG/ML IJ SOLN
12.5000 mg | Freq: Once | INTRAMUSCULAR | Status: AC
  Administered 2015-03-19: 12.5 mg via INTRAVENOUS
  Filled 2015-03-19: qty 1

## 2015-03-19 MED ORDER — ACETAMINOPHEN 325 MG PO TABS: 650.0000 mg | ORAL_TABLET | Freq: Four times a day (QID) | ORAL | Status: DC | PRN

## 2015-03-19 MED ORDER — HYDROMORPHONE HCL 1 MG/ML IJ SOLN
1.0000 mg | Freq: Once | INTRAMUSCULAR | Status: AC
  Administered 2015-03-19: 1 mg via INTRAVENOUS
  Filled 2015-03-19: qty 1

## 2015-03-19 MED ORDER — PROMETHAZINE HCL 25 MG/ML IJ SOLN
12.5000 mg | Freq: Four times a day (QID) | INTRAMUSCULAR | Status: DC | PRN
  Administered 2015-03-19: 12.5 mg via INTRAVENOUS
  Filled 2015-03-19: qty 1

## 2015-03-19 MED ORDER — FAMOTIDINE IN NACL 20-0.9 MG/50ML-% IV SOLN
20.0000 mg | Freq: Two times a day (BID) | INTRAVENOUS | Status: DC
  Administered 2015-03-19 – 2015-03-21 (×5): 20 mg via INTRAVENOUS
  Filled 2015-03-19 (×5): qty 50

## 2015-03-19 MED ORDER — ACETAMINOPHEN 650 MG RE SUPP: 650.0000 mg | Freq: Four times a day (QID) | RECTAL | Status: DC | PRN

## 2015-03-19 MED ORDER — METOCLOPRAMIDE HCL 5 MG/ML IJ SOLN
10.0000 mg | Freq: Once | INTRAMUSCULAR | Status: AC
  Administered 2015-03-19: 10 mg via INTRAVENOUS
  Filled 2015-03-19: qty 2

## 2015-03-19 MED ORDER — DIAZEPAM 5 MG PO TABS: 2.5000 mg | ORAL_TABLET | Freq: Every evening | ORAL | Status: DC | PRN

## 2015-03-19 NOTE — H&P (Signed)
[facility] History and Physical  Corey Fischer GEX:[id number] DOB: 02-Jan-1932 DOA: 03/19/2015  Referring physician: EDP PCP: Garret Reddish, MD   Chief Complaint: nausea and abdominal pain since 4 days.   HPI: Corey Fischer is a 81 y.o. male with prior h/o hypertension, diverticulosis, ITP, presents to ED with persistent symptoms of nausea and abdominal pain since 4 days. Asper the patient the abdominal pain started in the last week of September and was scheduled for an MRCP tomorrow , but he reports worsening pain associated with nausea and loose stools since 4 days. He called GI office and was asked to come to ED . EDP contacted Dr Carlean Purl, recommended medical admission and MRCP in am, and will see the patient in the morning. He denies any other complaints .  His blood work in the first week of October revealed elevated lipase levels and the lipase today is 30. Liver function tests are within normal limits and cbc is normal.  He will be admitted to med surg bed.    Review of Systems:  Constitutional:  Fatigue and nausea. HEENT:  No headaches, Difficulty swallowing,Tooth/dental problems,Sore throat,  No sneezing, itching, ear ache, nasal congestion, post nasal drip,  Cardio-vascular:  Palpitations present.  GI:  Nausea, abdominal pain.  Resp:  No shortness of breath with exertion or at rest. No excess mucus, no productive cough, No non-productive cough, No coughing up of blood.No change in color of mucus.No wheezing.No chest wall deformity  Skin:  no rash or lesions.  GU:  no dysuria, change in color of urine, no urgency or frequency. No flank pain.  Musculoskeletal:  No joint pain or swelling. No decreased range of motion. No back pain.  Psych:  No change in mood or affect. No depression or anxiety. No memory loss.   Past Medical History  Diagnosis Date  . Chronic ITP (idiopathic thrombocytopenia) (HCC)   . Hx of adenomatous colonic polyps 2010  . Diverticulosis   .  GERD (gastroesophageal reflux disease)   . Allergy   . Hyperlipidemia   . Hypertension   . Elevated PSA   . Anxiety   . Depression   . Stricture and stenosis of esophagus 2010    last endoscopy- 2010  . Gastritis   . Hemorrhoids   . History of hiatal hernia   . Raynaud's syndrome   . Gastric polyp   . Asymptomatic bilateral carotid artery stenosis     a. Last duplex 11/2013 R bulb and ICA - 50-69%, R ECA >50%. L bulb and prox ICA: 0-49%. Repeat due 11/2014.  . Arthritis   . Osteoarthritis hips  . BPH (benign prostatic hyperplasia)   . B12 deficiency   . Palpitations   . Symptomatic PVCs     a. Per report, monitor 05/2012 - NSR with symptomatic PVC. b. Echo 05/2012- EF 55-60%, aortic sclerosis without stenosis. c. Nuc 12/2012 - normal.  . H/O hiatal hernia   . PAC (premature atrial contraction)     a. NSR/SB with 1st degree AVB and PACs/atrial bigeminy seen on event monitor 11/2012.  . Sinus bradycardia   . Wandering (atrial) pacemaker   . INTERNAL HEMORRHOIDS WITHOUT MENTION COMP 08/14/2007    Qualifier: Diagnosis of  By: Arnoldo Morale MD, Balinda Quails    Past Surgical History  Procedure Laterality Date  . Rotator cuff repair Right 1987  . Tonsillectomy  age 55  . Appendectomy  age 29  . Laparoscopic inguinal hernia repair Bilateral 09-19-2010  . Left shoulder  surg.  2006  . Cholecystectomy  1989  . Carotid duplex Bilateral 05-26-2007  . Transurethral resection of prostate  07/22/2011    Procedure: TRANSURETHRAL RESECTION OF THE PROSTATE WITH GYRUS INSTRUMENTS;  Surgeon: Bernestine Amass, MD;  [facility];  Service: Urology;  Laterality: N/A;  Saline Gyrus TURP   . Cardiac catheterization  1989    Told that he is "pristine "  . Total hip arthroplasty Left 05/31/2013    Procedure: TOTAL HIP ARTHROPLASTY;  Surgeon: Kerin Salen, MD;  Location: [facility];  Service: Orthopedics;  Laterality: Left;   Social History:  reports that he has never smoked. He has never used  smokeless tobacco. He reports that he drinks alcohol. He reports that he does not use illicit drugs.  Allergies  Allergen Reactions  . Ciprofloxacin Other (See Comments)    Unknown reaction    Family History  Problem Relation Age of Onset  . Heart disease Mother   . Heart disease Father   . Colon cancer Neg Hx   . Colon polyps Mother   . Cancer Maternal Grandmother   . Heart failure Maternal Grandfather   . Angina Father     Prior to Admission medications   Medication Sig Start Date End Date Taking? Authorizing Provider  amLODipine (NORVASC) 2.5 MG tablet TAKE 1 TABLET BY MOUTH AT BEDTIME 11/28/14  Yes Marin Olp, MD  aspirin 81 MG tablet Take 81 mg by mouth daily.   Yes Historical Provider, MD  atenolol (TENORMIN) 25 MG tablet Take 0.5 tablets (12.5 mg total) by mouth daily. 03/11/14  Yes Dayna N Dunn, PA-C  cetirizine (ZYRTEC) 10 MG tablet Take 10 mg by mouth daily with breakfast.    Yes Historical Provider, MD  Cholecalciferol (VITAMIN D3) 1000 UNITS CAPS Take 2,000 Units by mouth daily.    Yes Historical Provider, MD  Coenzyme Q10 50 MG CAPS Take 250 mg by mouth daily.    Yes Historical Provider, MD  Cyanocobalamin 2500 MCG SUBL Place 2,500 mcg under the tongue daily.    Yes Historical Provider, MD  diazepam (VALIUM) 5 MG tablet Take 2.5-5 mg by mouth at bedtime as needed for anxiety or sedation.   Yes Historical Provider, MD  famotidine (PEPCID) 40 MG tablet TAKE 1 TABLET BY MOUTH TWICE DAILY. 09/20/14  Yes Amy S Esterwood, PA-C  hydrocortisone-pramoxine (ANALPRAM-HC) 2.5-1 % rectal cream Place 1 application rectally 3 (three) times daily.   Yes Historical Provider, MD  Magnesium 200 MG TABS Take 200 mg by mouth daily.    Yes Historical Provider, MD  polyethylene glycol (MIRALAX / GLYCOLAX) packet Take 17 g by mouth daily.    Yes Historical Provider, MD  promethazine (PHENERGAN) 25 MG suppository Place 25 mg rectally every 6 (six) hours as needed for nausea or vomiting.   Yes  Historical Provider, MD  simvastatin (ZOCOR) 20 MG tablet Take 20 mg by mouth every morning.   Yes Historical Provider, MD  Zinc 25 MG TABS Take 25 mg by mouth daily.    Yes Historical Provider, MD  Ascorbic Acid (VITAMIN C) 1000 MG tablet Take 1,000 mg by mouth daily.     Historical Provider, MD   Physical Exam: Filed Vitals:   03/19/15 1141 03/19/15 1448  BP: 125/67 141/57  Pulse: 80 60  Temp: 97.5 F (36.4 C) 98.1 F (36.7 C)  TempSrc: Oral Oral  Resp: 18 16  SpO2: 100% 95%    Wt Readings from Last 3  Encounters:  03/13/15 66.679 kg (147 lb)  02/28/15 67.586 kg (149 lb)  01/04/15 68.947 kg (152 lb)    General:  Appears calm and comfortable Eyes: PERRL, normal lids, irises & conjunctiva Neck: no LAD, masses or thyromegaly Cardiovascular: RRR, no m/r/g. No LE edema. Respiratory: CTA bilaterally, no w/r/r. Normal respiratory effort. Abdomen: soft, moderate tenderness in the epigastric area, bowel sounds are good.  Skin: no rash or induration seen on limited exam Musculoskeletal: grossly normal tone BUE/BLE Psychiatric: grossly normal mood and affect, speech fluent and appropriate Neurologic: grossly non-focal.          Labs on Admission:  Basic Metabolic Panel:  Recent Labs Lab 03/13/15 1049 03/19/15 1232  NA 138 134*  K 4.9 4.1  CL 103 96*  CO2 30 27  GLUCOSE 85 165*  BUN 16 13  CREATININE 1.14 1.18  CALCIUM 9.9 10.3   Liver Function Tests:  Recent Labs Lab 03/13/15 1049 03/19/15 1232  AST 14 23  ALT 14 17  ALKPHOS 59 59  BILITOT 0.4 0.9  PROT 6.9 7.6  ALBUMIN 4.0 4.5    Recent Labs Lab 03/13/15 1049 03/19/15 1232  LIPASE 29.0 30  AMYLASE 60  --    No results for input(s): AMMONIA in the last 168 hours. CBC:  Recent Labs Lab 03/19/15 1232  WBC 7.4  HGB 16.7  HCT 47.1  MCV 86.1  PLT 159   Cardiac Enzymes: No results for input(s): CKTOTAL, CKMB, CKMBINDEX, TROPONINI in the last 168 hours.  BNP (last 3 results) No results for  input(s): BNP in the last 8760 hours.  ProBNP (last 3 results) No results for input(s): PROBNP in the last 8760 hours.  CBG: No results for input(s): GLUCAP in the last 168 hours.  Radiological Exams on Admission: No results found.  EKG: pending.   Assessment/Plan Active Problems:   Nausea   Abdominal pain   Nausea and abdominal pain and recent pancreatitis of unclear etiology: Admit to med surg bed, Clear liquid diet and npo in am for the MRCP.  Symptomatic management with IV FLUIDS  And IV anti emetics and IV pepcid.  Just 2 loose BM today.    Mild hyponatremia: probably from dehydration.  Gentle hydration and monitor.    Hypertension:  Well controlled.   Symptomatic PVC'S - none today .  - on atenelol.   Code Status: full code.  DVT Prophylaxis: Family Communication: family at bedside Disposition Plan: pending further investigation  Time spent: 70 minutes  [facility] Pager [phone]

## 2015-03-19 NOTE — Telephone Encounter (Signed)
Having abdominal pain like he did when dx w/ pancreatitis. Cannot rest, unable to drink due to nausea and pain  Advised come to [facility] ED and seems like he will need an admission

## 2015-03-19 NOTE — ED Notes (Signed)
Patient c/o generalized abdominal pain, vomiting, and distention x2 days. Patient states a history of pancreatitis.

## 2015-03-19 NOTE — ED Provider Notes (Signed)
CSN: [id number]     Arrival date & time 03/19/15  1124 History   First MD Initiated Contact with Patient 03/19/15 1216     Chief Complaint  Patient presents with  . Abdominal Pain  . Emesis   (Consider location/radiation/quality/duration/timing/severity/associated sxs/prior Treatment) Patient is a 81 y.o. male presenting with abdominal pain and vomiting. The history is provided by the patient and the spouse. No language interpreter was used.  Abdominal Pain Associated symptoms: nausea   Associated symptoms: no chills, no dysuria and no fever   Emesis Associated symptoms: abdominal pain   Associated symptoms: no chills   Corey Fischer is an 81 year old male with a long medical history who presents for constant and worsening nausea and generalized abdominal pain and fullness 2 days. He states he took some Phenergan suppositories which have not helped. He has had 2 previous episodes similar to this and was told that he had pancreatitis. His last episode was 2 weeks ago. He is seeing a gastroenterologist and is scheduled to have an abdominal MRI tomorrow morning. He denies drinking alcohol and states that the cause is unknown at this point. He states that he began having diarrhea last night.  He's had a hernia repair, cholecystectomy and appendectomy in the past. He denies any fever, chills, chest pain, shortness of breath, cough, vomiting, constipation, dysuria, hematuria. Past Medical History  Diagnosis Date  . Chronic ITP (idiopathic thrombocytopenia) (HCC)   . Hx of adenomatous colonic polyps 2010  . Diverticulosis   . GERD (gastroesophageal reflux disease)   . Allergy   . Hyperlipidemia   . Hypertension   . Elevated PSA   . Anxiety   . Depression   . Stricture and stenosis of esophagus 2010    last endoscopy- 2010  . Gastritis   . Hemorrhoids   . History of hiatal hernia   . Raynaud's syndrome   . Gastric polyp   . Asymptomatic bilateral carotid artery stenosis     a. Last  duplex 11/2013 R bulb and ICA - 50-69%, R ECA >50%. L bulb and prox ICA: 0-49%. Repeat due 11/2014.  . Arthritis   . Osteoarthritis hips  . BPH (benign prostatic hyperplasia)   . B12 deficiency   . Palpitations   . Symptomatic PVCs     a. Per report, monitor 05/2012 - NSR with symptomatic PVC. b. Echo 05/2012- EF 55-60%, aortic sclerosis without stenosis. c. Nuc 12/2012 - normal.  . H/O hiatal hernia   . PAC (premature atrial contraction)     a. NSR/SB with 1st degree AVB and PACs/atrial bigeminy seen on event monitor 11/2012.  . Sinus bradycardia   . Wandering (atrial) pacemaker   . INTERNAL HEMORRHOIDS WITHOUT MENTION COMP 08/14/2007    Qualifier: Diagnosis of  By: Arnoldo Morale MD, Balinda Quails    Past Surgical History  Procedure Laterality Date  . Rotator cuff repair Right 1987  . Tonsillectomy  age 73  . Appendectomy  age 66  . Laparoscopic inguinal hernia repair Bilateral 09-19-2010  . Left shoulder surg.  2006  . Cholecystectomy  1989  . Carotid duplex Bilateral 05-26-2007  . Transurethral resection of prostate  07/22/2011    Procedure: TRANSURETHRAL RESECTION OF THE PROSTATE WITH GYRUS INSTRUMENTS;  Surgeon: Bernestine Amass, MD;  Location: [facility];  Service: Urology;  Laterality: N/A;  Saline Gyrus TURP   . Cardiac catheterization  1989    Told that he is "pristine "  . Total hip arthroplasty Left 05/31/2013  Procedure: TOTAL HIP ARTHROPLASTY;  Surgeon: Kerin Salen, MD;  Location: [facility];  Service: Orthopedics;  Laterality: Left;   Family History  Problem Relation Age of Onset  . Heart disease Mother   . Heart disease Father   . Colon cancer Neg Hx   . Colon polyps Mother   . Cancer Maternal Grandmother   . Heart failure Maternal Grandfather   . Angina Father    Social History  Substance Use Topics  . Smoking status: Never Smoker   . Smokeless tobacco: Never Used  . Alcohol Use: Yes     Comment: occasionally    Review of Systems  Constitutional: Negative for  fever and chills.  Gastrointestinal: Positive for nausea and abdominal pain.  Genitourinary: Negative for dysuria.  All other systems reviewed and are negative.     Allergies  Ciprofloxacin  Home Medications   Prior to Admission medications   Medication Sig Start Date End Date Taking? Authorizing Provider  amLODipine (NORVASC) 2.5 MG tablet TAKE 1 TABLET BY MOUTH AT BEDTIME 11/28/14  Yes Marin Olp, MD  aspirin 81 MG tablet Take 81 mg by mouth daily.   Yes Historical Provider, MD  atenolol (TENORMIN) 25 MG tablet Take 0.5 tablets (12.5 mg total) by mouth daily. 03/11/14  Yes Dayna N Dunn, PA-C  cetirizine (ZYRTEC) 10 MG tablet Take 10 mg by mouth daily with breakfast.    Yes Historical Provider, MD  Cholecalciferol (VITAMIN D3) 1000 UNITS CAPS Take 2,000 Units by mouth daily.    Yes Historical Provider, MD  Coenzyme Q10 50 MG CAPS Take 250 mg by mouth daily.    Yes Historical Provider, MD  Cyanocobalamin 2500 MCG SUBL Place 2,500 mcg under the tongue daily.    Yes Historical Provider, MD  diazepam (VALIUM) 5 MG tablet Take 2.5-5 mg by mouth at bedtime as needed for anxiety or sedation.   Yes Historical Provider, MD  famotidine (PEPCID) 40 MG tablet TAKE 1 TABLET BY MOUTH TWICE DAILY. 09/20/14  Yes Amy S Esterwood, PA-C  hydrocortisone-pramoxine (ANALPRAM-HC) 2.5-1 % rectal cream Place 1 application rectally 3 (three) times daily.   Yes Historical Provider, MD  Magnesium 200 MG TABS Take 200 mg by mouth daily.    Yes Historical Provider, MD  polyethylene glycol (MIRALAX / GLYCOLAX) packet Take 17 g by mouth daily.    Yes Historical Provider, MD  promethazine (PHENERGAN) 25 MG suppository Place 25 mg rectally every 6 (six) hours as needed for nausea or vomiting.   Yes Historical Provider, MD  simvastatin (ZOCOR) 20 MG tablet Take 20 mg by mouth every morning.   Yes Historical Provider, MD  Zinc 25 MG TABS Take 25 mg by mouth daily.    Yes Historical Provider, MD  Ascorbic Acid (VITAMIN  C) 1000 MG tablet Take 1,000 mg by mouth daily.     Historical Provider, MD   BP 141/57 mmHg  Pulse 60  Temp(Src) 98.1 F (36.7 C) (Oral)  Resp 16  SpO2 95% Physical Exam  Constitutional: He is oriented to person, place, and time. He appears well-developed and well-nourished.  HENT:  Head: Normocephalic and atraumatic.  Eyes: Conjunctivae are normal.  Neck: Normal range of motion. Neck supple.  Cardiovascular: Normal rate, regular rhythm and normal heart sounds.   Pulmonary/Chest: Effort normal and breath sounds normal.  Abdominal: Soft. Normal appearance. He exhibits no distension. There is tenderness in the epigastric area. There is no guarding.    Significant abdominal tenderness to palpation in  the epigastric region. Normal appearance without distention. Abdomen is soft. No guarding.  Musculoskeletal: Normal range of motion.  Neurological: He is alert and oriented to person, place, and time.  Skin: Skin is warm and dry.  Nursing note and vitals reviewed.   ED Course  Procedures (including critical care time) Labs Review Labs Reviewed  COMPREHENSIVE METABOLIC PANEL - Abnormal; Notable for the following:    Sodium 134 (*)    Chloride 96 (*)    Glucose, Bld 165 (*)    GFR calc non Af Amer 56 (*)    All other components within normal limits  URINALYSIS, ROUTINE W REFLEX MICROSCOPIC (NOT AT [facility]) - Abnormal; Notable for the following:    pH 8.5 (*)    Protein, ur 30 (*)    All other components within normal limits  LIPASE, BLOOD  CBC  URINE MICROSCOPIC-ADD ON    Imaging Review No results found. I have personally reviewed and evaluated these lab results as part of my medical decision-making.   EKG Interpretation None      MDM   Final diagnoses:  Nausea  Epigastric pain  Patient with recent diagnosis of pancreatitis of unknown etiology presents for nausea and worsening abdominal pain 2 days. Patient's gastroenterologist is Dr. Henrene Pastor. A shunt has a scheduled  MRI of his abdomen tomorrow morning. According to the note, the patient has pancreatitis of unknown etiology. He was recently taken off of a statin medication thinking that this may be contributory to his pancreatitis. I spoke to Dr. Alfonse Spruce regarding this patient who has seen and evaluated patient. I spoke to Dr. Carlean Purl who was on-call for GI. He was aware of the patient and told the patient to come to the ED if symptoms could not be controlled with home medications. I explained that his labs were normal including his LFTs and lipase. He stated that if the patient's symptoms could not be resolved, he would need admission.   I spoke to Dr. Karleen Hampshire regarding patients symptoms and need for MRCP.  Will admit to medsurg.   Corey Glazier, PA-C 03/19/15 1549  Harvel Quale, MD 03/19/15 2206

## 2015-03-20 ENCOUNTER — Encounter (HOSPITAL_COMMUNITY): Payer: Self-pay | Admitting: Radiology

## 2015-03-20 ENCOUNTER — Observation Stay (HOSPITAL_COMMUNITY): Payer: Medicare Other

## 2015-03-20 ENCOUNTER — Ambulatory Visit (HOSPITAL_COMMUNITY): Admission: RE | Admit: 2015-03-20 | Payer: Medicare Other | Source: Ambulatory Visit

## 2015-03-20 DIAGNOSIS — R110 Nausea: Secondary | ICD-10-CM | POA: Diagnosis not present

## 2015-03-20 DIAGNOSIS — R109 Unspecified abdominal pain: Secondary | ICD-10-CM | POA: Diagnosis not present

## 2015-03-20 DIAGNOSIS — R188 Other ascites: Secondary | ICD-10-CM | POA: Diagnosis not present

## 2015-03-20 DIAGNOSIS — K625 Hemorrhage of anus and rectum: Secondary | ICD-10-CM | POA: Diagnosis not present

## 2015-03-20 DIAGNOSIS — R1032 Left lower quadrant pain: Secondary | ICD-10-CM

## 2015-03-20 DIAGNOSIS — R1013 Epigastric pain: Secondary | ICD-10-CM

## 2015-03-20 DIAGNOSIS — R112 Nausea with vomiting, unspecified: Secondary | ICD-10-CM | POA: Diagnosis not present

## 2015-03-20 MED ORDER — HYOSCYAMINE SULFATE 0.125 MG/5ML PO ELIX
0.2500 mg | ORAL_SOLUTION | ORAL | Status: DC | PRN
  Filled 2015-03-20: qty 2

## 2015-03-20 MED ORDER — GADOBENATE DIMEGLUMINE 529 MG/ML IV SOLN
15.0000 mL | Freq: Once | INTRAVENOUS | Status: AC | PRN
  Administered 2015-03-20: 13 mL via INTRAVENOUS

## 2015-03-20 MED ORDER — IOHEXOL 300 MG/ML  SOLN
50.0000 mL | Freq: Once | INTRAMUSCULAR | Status: AC | PRN
  Administered 2015-03-20: 50 mL via ORAL

## 2015-03-20 MED ORDER — PANTOPRAZOLE SODIUM 40 MG PO TBEC
40.0000 mg | DELAYED_RELEASE_TABLET | Freq: Every day | ORAL | Status: DC
  Administered 2015-03-21 – 2015-03-22 (×2): 40 mg via ORAL
  Filled 2015-03-20 (×2): qty 1

## 2015-03-20 MED ORDER — IOHEXOL 300 MG/ML  SOLN
100.0000 mL | Freq: Once | INTRAMUSCULAR | Status: AC | PRN
  Administered 2015-03-20: 100 mL via INTRAVENOUS

## 2015-03-20 NOTE — Consult Note (Signed)
Referring Provider: [facility] Primary Care Physician:  Garret Reddish, MD Primary Gastroenterologist:  Dr.[name]  Reason for Consultation:  Abdominal pain  HPI: Corey Fischer is a 81 y.o. male with a history of hypertension, hyperlipidemia, anxiety disorder, depression, carotid artery stenosis, mild ITP, GERD complicated by peptic stricture, and chronic recurrent abdominal pain without specific cause. Through the years he has been seen with complaints of intermittent abdominal pain since 2006. He reports upper abdominal pain, mostly epigastric, that occurs fairly constant lately though waxing and waning. He has not identified  factors that Aleve or exacerbate his pain. He had been on a low-dose PPI for GERD but discontinued this about 6 months ago. He does report that over the past several months he has been having frequent regurgitation at night and worsening heartburn. His last colonoscopy was in April 2010 and revealed an isolated diverticulum, diminutive polyps, and hemorrhoids. His last upper endoscopy was in December 2011 and was normal except for an incidental esophageal stricture and benign pedunculated polyp in the gastric body. Around that time he was noted to have a minimally elevated lipase for which a CT of the abdomen and pelvis with contrast was performed. This was normal including the pancreas. He was again seen in June 2012 due to complaints of recurrent pain and an elevated lipase. He had an endoscopic ultrasound with strict attention to the pancreas in August 2012. This was entirely normal. He was seen a month later with complaints of pain and his pancreatic enzymes had returned to normal. He also has frequent constipation for which he uses Mira lax. He was most recently seen in the office on October 24 with complaints of upper abdominal pain that had occurred in early September he reports that that attack pain lasted for 2 days. 2 weeks later he had a more intense episode of pain  where he had pain across the upper abdomen with no associated nausea vomiting or back pain. He was seen by his primary care physician, Dr. Yong Channel on October 11 when it was noted that his CBC was normal, liver functions were normal, lipase was elevated at 361, and amylase was elevated at 302. His simvastatin was discontinued and he was placed on a liquid diet for several days with resolution of his symptoms. When seen on the 24th he was feeling fairly well but was concerned as to what was causing the episodes of pancreatitis. He was on Pepcid 40 mg twice a day. He was also on Mira lax but preferred not to take it on a daily basis. At that visit he was scheduled for an outpatient MRCP. This past Saturday he ate a late breakfast consisting of fried potatoes onions peppers and mushrooms and olive oil and subsequently developed heartburn, bloating, and abdominal pain. He states he passed several formed bowel movements and then began to have diarrhea. He called the GI office and was instructed to come to the emergency room. Blood work revealed a lipase of 30 with normal LFTs. MRCP this morning showed cholecystectomy without biliary duct dilatation or common duct stone. No other explanation for abdominal pain. 6 mm cystic focus in the pancreatic head is most likely a tiny pseudocyst. Indolent cystic neoplasm could look similarly. Per consensus criteria, this warrants follow-up with pre-and post contrast abdominal MRI in one year. Patient states this morning his pain is all across the lower abdomen, especially in the left lower quadrant. He states when he came in last night his lower abdominal pain is  severe and was accompanied with severe nausea but he was unable to vomit. He complains of several days of postprandial bloating with abdominal distention and cramping. He has had no bright red blood per rectum or melena.   Past Medical History  Diagnosis Date  . Chronic ITP (idiopathic thrombocytopenia) (HCC)   . Hx of  adenomatous colonic polyps 2010  . Diverticulosis   . GERD (gastroesophageal reflux disease)   . Allergy   . Hyperlipidemia   . Hypertension   . Elevated PSA   . Anxiety   . Depression   . Stricture and stenosis of esophagus 2010    last endoscopy- 2010  . Gastritis   . Hemorrhoids   . History of hiatal hernia   . Raynaud's syndrome   . Gastric polyp   . Asymptomatic bilateral carotid artery stenosis     a. Last duplex 11/2013 R bulb and ICA - 50-69%, R ECA >50%. L bulb and prox ICA: 0-49%. Repeat due 11/2014.  . Arthritis   . Osteoarthritis hips  . BPH (benign prostatic hyperplasia)   . B12 deficiency   . Palpitations   . Symptomatic PVCs     a. Per report, monitor 05/2012 - NSR with symptomatic PVC. b. Echo 05/2012- EF 55-60%, aortic sclerosis without stenosis. c. Nuc 12/2012 - normal.  . H/O hiatal hernia   . PAC (premature atrial contraction)     a. NSR/SB with 1st degree AVB and PACs/atrial bigeminy seen on event monitor 11/2012.  . Sinus bradycardia   . Wandering (atrial) pacemaker   . INTERNAL HEMORRHOIDS WITHOUT MENTION COMP 08/14/2007    Qualifier: Diagnosis of  By: Arnoldo Morale MD, Balinda Quails     Past Surgical History  Procedure Laterality Date  . Rotator cuff repair Right 1987  . Tonsillectomy  age 60  . Appendectomy  age 64  . Laparoscopic inguinal hernia repair Bilateral 09-19-2010  . Left shoulder surg.  2006  . Cholecystectomy  1989  . Carotid duplex Bilateral 05-26-2007  . Transurethral resection of prostate  07/22/2011    Procedure: TRANSURETHRAL RESECTION OF THE PROSTATE WITH GYRUS INSTRUMENTS;  Surgeon: Bernestine Amass, MD;  Location: [facility];  Service: Urology;  Laterality: N/A;  Saline Gyrus TURP   . Cardiac catheterization  1989    Told that he is "pristine "  . Total hip arthroplasty Left 05/31/2013    Procedure: TOTAL HIP ARTHROPLASTY;  Surgeon: Kerin Salen, MD;  Location: [place];  Service: Orthopedics;  Laterality: Left;    Prior to  Admission medications   Medication Sig Start Date End Date Taking? Authorizing Provider  amLODipine (NORVASC) 2.5 MG tablet TAKE 1 TABLET BY MOUTH AT BEDTIME 11/28/14  Yes Marin Olp, MD  aspirin 81 MG tablet Take 81 mg by mouth daily.   Yes Historical Provider, MD  atenolol (TENORMIN) 25 MG tablet Take 0.5 tablets (12.5 mg total) by mouth daily. 03/11/14  Yes Dayna N Dunn, PA-C  cetirizine (ZYRTEC) 10 MG tablet Take 10 mg by mouth daily with breakfast.    Yes Historical Provider, MD  Cholecalciferol (VITAMIN D3) 1000 UNITS CAPS Take 2,000 Units by mouth daily.    Yes Historical Provider, MD  Coenzyme Q10 50 MG CAPS Take 250 mg by mouth daily.    Yes Historical Provider, MD  Cyanocobalamin 2500 MCG SUBL Place 2,500 mcg under the tongue daily.    Yes Historical Provider, MD  diazepam (VALIUM) 5 MG tablet Take 2.5-5 mg by mouth at  bedtime as needed for anxiety or sedation.   Yes Historical Provider, MD  famotidine (PEPCID) 40 MG tablet TAKE 1 TABLET BY MOUTH TWICE DAILY. 09/20/14  Yes Amy S Esterwood, PA-C  hydrocortisone-pramoxine (ANALPRAM-HC) 2.5-1 % rectal cream Place 1 application rectally 3 (three) times daily.   Yes Historical Provider, MD  Magnesium 200 MG TABS Take 200 mg by mouth daily.    Yes Historical Provider, MD  polyethylene glycol (MIRALAX / GLYCOLAX) packet Take 17 g by mouth daily.    Yes Historical Provider, MD  promethazine (PHENERGAN) 25 MG suppository Place 25 mg rectally every 6 (six) hours as needed for nausea or vomiting.   Yes Historical Provider, MD  simvastatin (ZOCOR) 20 MG tablet Take 20 mg by mouth every morning.   Yes Historical Provider, MD  Zinc 25 MG TABS Take 25 mg by mouth daily.    Yes Historical Provider, MD  Ascorbic Acid (VITAMIN C) 1000 MG tablet Take 1,000 mg by mouth daily.     Historical Provider, MD    Current Facility-Administered Medications  Medication Dose Route Frequency Provider Last Rate Last Dose  . 0.9 %  sodium chloride infusion    Intravenous Continuous Hosie Poisson, MD 75 mL/hr at 03/19/15 1800 1,000 mL at 03/19/15 1800  . acetaminophen (TYLENOL) tablet 650 mg  650 mg Oral Q6H PRN Hosie Poisson, MD       Or  . acetaminophen (TYLENOL) suppository 650 mg  650 mg Rectal Q6H PRN Hosie Poisson, MD      . atenolol (TENORMIN) tablet 12.5 mg  12.5 mg Oral Daily Hosie Poisson, MD   12.5 mg at 03/20/15 0944  . diazepam (VALIUM) tablet 2.5-5 mg  2.5-5 mg Oral QHS PRN Hosie Poisson, MD      . enoxaparin (LOVENOX) injection 40 mg  40 mg Subcutaneous Q24H Hosie Poisson, MD   40 mg at 03/19/15 1956  . famotidine (PEPCID) IVPB 20 mg premix  20 mg Intravenous Q12H Hosie Poisson, MD   20 mg at 03/20/15 0934  . hydrocortisone-pramoxine (ANALPRAM-HC) 2.5-1 % rectal cream 1 application  1 application Rectal TID Hosie Poisson, MD   1 application at 03/47/42 2235  . morphine 2 MG/ML injection 1-2 mg  1-2 mg Intravenous Q3H PRN Hosie Poisson, MD   2 mg at 03/20/15 0739  . ondansetron (ZOFRAN) tablet 4 mg  4 mg Oral Q6H PRN Hosie Poisson, MD       Or  . ondansetron (ZOFRAN) injection 4 mg  4 mg Intravenous Q6H PRN Hosie Poisson, MD   4 mg at 03/19/15 1955  . promethazine (PHENERGAN) injection 12.5 mg  12.5 mg Intravenous Q6H PRN Hosie Poisson, MD   12.5 mg at 03/19/15 2235    Allergies as of 03/19/2015 - Review Complete 03/19/2015  Allergen Reaction Noted  . Ciprofloxacin Other (See Comments) 10/16/2012    Family History  Problem Relation Age of Onset  . Heart disease Mother   . Heart disease Father   . Colon cancer Neg Hx   . Colon polyps Mother   . Cancer Maternal Grandmother   . Heart failure Maternal Grandfather   . Angina Father     Social History   Social History  . Marital Status: Married    Spouse Name: N/A  . Number of Children: 2  . Years of Education: N/A   Occupational History  . retired    Social History Main Topics  . Smoking status: Never Smoker   . Smokeless tobacco: Never  Used  . Alcohol Use: Yes     Comment:  occasionally  . Drug Use: No  . Sexual Activity: Yes   Other Topics Concern  . Not on file   Social History Narrative   Grew up in [place] town in [place]. 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 Met wife in [place]. At 50, got married and went to college-undergrad in [employer] as well as Oceanographer, doctorate in Location manager.  Worked at [employer] which eventually became [employer]. Lived in [place] and [place] before eventually moving to [place]. 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       Married over 50 years. 59 years 01/2014. 2 married children with 2 children each. 1 son near [place]. 1 son in [place] with house of rep.       Hobbies: work out most mornings 5 days a week (spin, elipitical), racquetball until 2014 after hip surgery.     Review of Systems: Gen: Denies any fever, chills, sweats, anorexia, malaise, weight loss, and sleep disorder. Admits to fatigue and weakness CV: Denies chest pain, angina, palpitations, syncope, orthopnea, PND, peripheral edema, and claudication. Resp: Denies dyspnea at rest, dyspnea with exercise, cough, sputum, wheezing, coughing up blood, and pleurisy. GI: Denies vomiting blood, jaundice, and fecal incontinence.   Denies dysphagia or odynophagia. has nausea, vomiting, and diffuse abdominal pain  GU : Denies urinary burning, blood in urine, urinary frequency, urinary hesitancy, nocturnal urination, and urinary incontinence. MS: Denies joint pain, limitation of movement, and swelling, stiffness, low back pain, extremity pain. Denies muscle weakness, cramps, atrophy.  Derm: Denies rash, itching, dry skin, hives, moles, warts, or unhealing ulcers.  Psych: Denies depression, anxiety, memory loss, suicidal ideation, hallucinations, paranoia, and confusion. Heme: Denies bruising, bleeding, and enlarged lymph nodes. Neuro:  Denies any headaches, dizziness, paresthesias. Endo:  Denies any problems with DM, thyroid, adrenal  function.  Physical Exam: Vital signs in last 24 hours: Temp:  [97.3 F (36.3 C)-98.4 F (36.9 C)] 97.7 F (36.5 C) (10/31 0521) Pulse Rate:  [46-62] 46 (10/31 0944) Resp:  [16] 16 (10/31 0521) BP: (115-149)/(49-62) 115/49 mmHg (10/31 0944) SpO2:  [95 %-99 %] 97 % (10/31 0521) Weight:  [147 lb 1.6 oz (66.724 kg)] 147 lb 1.6 oz (66.724 kg) (10/30 1854) Last BM Date: 03/19/15 General:   Alert,  Well-developed, well-nourished, pleasant and cooperative in NAD Head:  Normocephalic and atraumatic. Eyes:  Sclera clear, no icterus. Conjunctiva pink. Ears:  Normal auditory acuity. Nose:  No deformity, discharge or lesions. Mouth:  No deformity or lesions.   Neck:  Supple; no masses or thyromegaly. Lungs:  Clear throughout to auscultation.   No wheezes, crackles, or rhonchi  Heart:  Regular rate and rhythm; no murmurs, clicks, rubs,  or gallops. Abdomen:  Soft, tender to palpation periumbilical area and left lower quadrant. Mild tenderness epigastric area.  BS active,nonpalp mass or hsm.   Rectal:  Deferred  Msk:  Symmetrical without gross deformities. . Pulses:  Normal pulses noted. Extremities: Without clubbing or edema. Neurologic: Alert and  oriented x4;  grossly normal neurologically. Skin:  Intact without significant lesions or rashes.. Psych: Alert and cooperative. Normal mood and affect.  Intake/Output from previous day: 10/30 0701 - 10/31 0700 In: 2950 [I.V.:900; IV Piggyback:2050] Out: 1180 [Urine:1180] Intake/Output this shift:    Lab Results:  Recent Labs  03/19/15 1232  WBC 7.4  HGB 16.7  HCT 47.1  PLT 159   BMET  Recent  Labs  03/19/15 1232  NA 134*  K 4.1  CL 96*  CO2 27  GLUCOSE 165*  BUN 13  CREATININE 1.18  CALCIUM 10.3   LFT  Recent Labs  03/19/15 1232  PROT 7.6  ALBUMIN 4.5  AST 23  ALT 17  ALKPHOS 49  BILITOT 0.9   Endoscopies: Endoscopic ultrasound August 2012 showed normal esophagus, stomach, duodenum. Pancreatic parenchyma was  normal throughout the uncinate, head, neck, body and tail. No discrete masses and no signs of chronic pancreatitis. Main pancreatic duct was normal and nondilated. CBD was normal nondilated without filling defects. No peripancreatic adenopathy. Gallbladder surgically absent.  EGD 04/25/2010: Benign stricture in the distal esophagus. Small benign pedunculated polyp in the body of the stomach. Normal EGD otherwise. GERD.  Colonoscopy 09/08/2008 had 2 polyps in the ascending colon removed. Diverticulum at the hepatic flexure. Internal hemorrhoids. Pathology revealed hyperplastic polyps and he was advised to have surveillance in 10 years.   Studies/Results: Mr 3d Recon At Scanner  03/20/2015  CLINICAL DATA:  Worsening pain since September. Possible common duct stones. EXAM: MRI ABDOMEN WITHOUT AND WITH CONTRAST (INCLUDING MRCP) TECHNIQUE: Multiplanar multisequence MR imaging of the abdomen was performed both before and after the administration of intravenous contrast. Heavily T2-weighted images of the biliary and pancreatic ducts were obtained, and three-dimensional MRCP images were rendered by post processing. CONTRAST:  91m MULTIHANCE GADOBENATE DIMEGLUMINE 529 MG/ML IV SOLN COMPARISON:  CT of 04/24/2010 FINDINGS: Lower chest: Probable right base scarring. Borderline cardiomegaly, without pleural effusion. Hepatobiliary: Normal liver. Cholecystectomy. Normal common duct caliber, including 5 mm on image/series 66/4. No evidence of choledocholithiasis. Pancreas: No pancreatic duct dilatation or mass. A 6 mm pancreatic head cystic focus including on image/series 41/4. Spleen: Normal in size, without focal abnormality. Adrenals/Urinary Tract: Normal adrenal glands. Normal right kidney. An upper pole subcentimeter left renal cyst. No hydronephrosis. Stomach/Bowel: Underdistended stomach. Descending duodenal diverticulum. Otherwise normal large and small bowel. Vascular/Lymphatic: Aortic atherosclerosis.  Retroaortic left renal vein. No retroperitoneal or retrocrural adenopathy. Other: No ascites. Musculoskeletal: No acute osseous abnormality. IMPRESSION: 1. Cholecystectomy without biliary duct dilatation or common duct stone. 2. No other explanation for abdominal pain. 3. 6 mm cystic focus in the pancreatic head is most likely a tiny pseudocyst. Indolent cystic neoplasm could look similar. Per consensus criteria, this warrants followup with pre and post contrast abdominal MRI at 1 year. This recommendation follows ACR consensus guidelines: Managing Incidental Findings on Abdominal CT: White Paper of the ACR Incidental Findings Committee. [phone] Signed   By: KAbigail MiyamotoM.D.   On: 03/20/2015 08:56   Mr Abd W/wo Cm/mrcp  03/20/2015  CLINICAL DATA:  Worsening pain since September. Possible common duct stones. EXAM: MRI ABDOMEN WITHOUT AND WITH CONTRAST (INCLUDING MRCP) TECHNIQUE: Multiplanar multisequence MR imaging of the abdomen was performed both before and after the administration of intravenous contrast. Heavily T2-weighted images of the biliary and pancreatic ducts were obtained, and three-dimensional MRCP images were rendered by post processing. CONTRAST:  180mMULTIHANCE GADOBENATE DIMEGLUMINE 529 MG/ML IV SOLN COMPARISON:  CT of 04/24/2010 FINDINGS: Lower chest: Probable right base scarring. Borderline cardiomegaly, without pleural effusion. Hepatobiliary: Normal liver. Cholecystectomy. Normal common duct caliber, including 5 mm on image/series 66/4. No evidence of choledocholithiasis. Pancreas: No pancreatic duct dilatation or mass. A 6 mm pancreatic head cystic focus including on image/series 41/4. Spleen: Normal in size, without focal abnormality. Adrenals/Urinary Tract: Normal adrenal glands. Normal right kidney. An upper pole subcentimeter left renal  cyst. No hydronephrosis. Stomach/Bowel: Underdistended stomach. Descending duodenal diverticulum. Otherwise  normal large and small bowel. Vascular/Lymphatic: Aortic atherosclerosis. Retroaortic left renal vein. No retroperitoneal or retrocrural adenopathy. Other: No ascites. Musculoskeletal: No acute osseous abnormality. IMPRESSION: 1. Cholecystectomy without biliary duct dilatation or common duct stone. 2. No other explanation for abdominal pain. 3. 6 mm cystic focus in the pancreatic head is most likely a tiny pseudocyst. Indolent cystic neoplasm could look similar. Per consensus criteria, this warrants followup with pre and post contrast abdominal MRI at 1 year. This recommendation follows ACR consensus guidelines: Managing Incidental Findings on Abdominal CT: White Paper of the ACR Incidental Findings Committee. [place] [phone] Electronically Signed   By: Abigail Miyamoto M.D.   On: 03/20/2015 08:56    IMPRESSION:/PLAN:  81 year old male status post several episodes of pancreatitis of unclear etiology, the most recent of which was 3 weeks ago. He is status post cholecystectomy, negative EUS February 2012. Question of pancreatitis where secondary to medications, statin has been discontinued. Patient was scheduled for MRCP as an outpatient but due to recurrent pain this weekend was admitted. Lipase on admission was 30 and liver enzymes were normal. MRCP is nonrevealing.? If his upper abdominal discomfort and bloating may be due to GERD/duodenitis? His pain that occurred yesterday is described as. Umbilical and left lower quadrant associated with diarrhea. Will review with attending but likely will obtain abdominal/pelvic CT to further evaluate for enteritis/colitis/diverticulitis. Would resume PPI every morning. Anti-'s spasmodic such as Levsin when necessary. Trend LFTs and lipase.    Tammey Deeg, Deloris Ping 03/20/2015,  Pager [phone]  Mon-Fri 8a-5p [phone] after 5p, weekends, holidays

## 2015-03-20 NOTE — Progress Notes (Signed)
[facility] PROGRESS NOTE  Corey Fischer WUX:[id number] DOB: [birth year] DOA: 03/19/2015 PCP: Garret Reddish, MD  Assessment/Plan: 1. Epigastric pain/ nausea and gen malaise: - admitted to med surg.  - MRCP of the abdomen is unremarkable.  -GI consulted for further recommendations.  - currently on IV pepcid, which the pt prefers than PPI.  - LIVER FUNCTION PANEL and lipase within normal limits.     symptomatic PVC'S : - resume atenelol.    Code Status: full code.  Family Communication: none at bedside.  Disposition Plan: pending.    Consultants:  Gastroenterology.  Procedures:  MRCP OF THE ABDOMEN.   Antibiotics:  None   HPI/Subjective: pain and nausea improved   Objective: Filed Vitals:   2015/04/11 1449  BP: 117/51  Pulse: 46  Temp: 98.5 F (36.9 C)  Resp: 16    Intake/Output Summary (Last 24 hours) at Apr 11, 2015 1615 Last data filed at 2015/04/11 1500  Gross per 24 hour  Intake   3275 ml  Output   2480 ml  Net    795 ml   Filed Weights   03/19/15 1854  Weight: 66.724 kg (147 lb 1.6 oz)    Exam:   General:  Alert afebrile comfortable  Cardiovascular: s1s2  Respiratory: ctab  Abdomen: soft tender in the epigastric area, and in the lower quadrant. Non distended bowel sounds heard.   Musculoskeletal: no pedal edema.   Data Reviewed: Basic Metabolic Panel:  Recent Labs Lab 03/19/15 1232  NA 134*  K 4.1  CL 96*  CO2 27  GLUCOSE 165*  BUN 13  CREATININE 1.18  CALCIUM 10.3   Liver Function Tests:  Recent Labs Lab 03/19/15 1232  AST 23  ALT 17  ALKPHOS 59  BILITOT 0.9  PROT 7.6  ALBUMIN 4.5    Recent Labs Lab 03/19/15 1232  LIPASE 30   No results for input(s): AMMONIA in the last 168 hours. CBC:  Recent Labs Lab 03/19/15 1232  WBC 7.4  HGB 16.7  HCT 47.1  MCV 86.1  PLT 159   Cardiac Enzymes: No results for input(s): CKTOTAL, CKMB, CKMBINDEX, TROPONINI in the last 168 hours. BNP (last 3 results) No results  for input(s): BNP in the last 8760 hours.  ProBNP (last 3 results) No results for input(s): PROBNP in the last 8760 hours.  CBG: No results for input(s): GLUCAP in the last 168 hours.  No results found for this or any previous visit (from the past 240 hour(s)).   Studies: Mr 3d Recon At Scanner  2015/04/11  CLINICAL DATA:  Worsening pain since September. Possible common duct stones. EXAM: MRI ABDOMEN WITHOUT AND WITH CONTRAST (INCLUDING MRCP) TECHNIQUE: Multiplanar multisequence MR imaging of the abdomen was performed both before and after the administration of intravenous contrast. Heavily T2-weighted images of the biliary and pancreatic ducts were obtained, and three-dimensional MRCP images were rendered by post processing. CONTRAST:  78mL MULTIHANCE GADOBENATE DIMEGLUMINE 529 MG/ML IV SOLN COMPARISON:  CT of 04/24/2010 FINDINGS: Lower chest: Probable right base scarring. Borderline cardiomegaly, without pleural effusion. Hepatobiliary: Normal liver. Cholecystectomy. Normal common duct caliber, including 5 mm on image/series 66/4. No evidence of choledocholithiasis. Pancreas: No pancreatic duct dilatation or mass. A 6 mm pancreatic head cystic focus including on image/series 41/4. Spleen: Normal in size, without focal abnormality. Adrenals/Urinary Tract: Normal adrenal glands. Normal right kidney. An upper pole subcentimeter left renal cyst. No hydronephrosis. Stomach/Bowel: Underdistended stomach. Descending duodenal diverticulum. Otherwise normal large and small bowel. Vascular/Lymphatic: Aortic atherosclerosis. Retroaortic left  renal vein. No retroperitoneal or retrocrural adenopathy. Other: No ascites. Musculoskeletal: No acute osseous abnormality. IMPRESSION: 1. Cholecystectomy without biliary duct dilatation or common duct stone. 2. No other explanation for abdominal pain. 3. 6 mm cystic focus in the pancreatic head is most likely a tiny pseudocyst. Indolent cystic neoplasm could look similar.  Per consensus criteria, this warrants followup with pre and post contrast abdominal MRI at 1 year. This recommendation follows ACR consensus guidelines: Managing Incidental Findings on Abdominal CT: White Paper of the ACR Incidental Findings Committee. [place] [phone] Electronically Signed   By: Abigail Miyamoto M.D.   On: 03/20/2015 08:56   Mr Abd W/wo Cm/mrcp  03/20/2015  CLINICAL DATA:  Worsening pain since September. Possible common duct stones. EXAM: MRI ABDOMEN WITHOUT AND WITH CONTRAST (INCLUDING MRCP) TECHNIQUE: Multiplanar multisequence MR imaging of the abdomen was performed both before and after the administration of intravenous contrast. Heavily T2-weighted images of the biliary and pancreatic ducts were obtained, and three-dimensional MRCP images were rendered by post processing. CONTRAST:  34mL MULTIHANCE GADOBENATE DIMEGLUMINE 529 MG/ML IV SOLN COMPARISON:  CT of 04/24/2010 FINDINGS: Lower chest: Probable right base scarring. Borderline cardiomegaly, without pleural effusion. Hepatobiliary: Normal liver. Cholecystectomy. Normal common duct caliber, including 5 mm on image/series 66/4. No evidence of choledocholithiasis. Pancreas: No pancreatic duct dilatation or mass. A 6 mm pancreatic head cystic focus including on image/series 41/4. Spleen: Normal in size, without focal abnormality. Adrenals/Urinary Tract: Normal adrenal glands. Normal right kidney. An upper pole subcentimeter left renal cyst. No hydronephrosis. Stomach/Bowel: Underdistended stomach. Descending duodenal diverticulum. Otherwise normal large and small bowel. Vascular/Lymphatic: Aortic atherosclerosis. Retroaortic left renal vein. No retroperitoneal or retrocrural adenopathy. Other: No ascites. Musculoskeletal: No acute osseous abnormality. IMPRESSION: 1. Cholecystectomy without biliary duct dilatation or common duct stone. 2. No other explanation for abdominal pain. 3. 6 mm cystic focus in the pancreatic head is most  likely a tiny pseudocyst. Indolent cystic neoplasm could look similar. Per consensus criteria, this warrants followup with pre and post contrast abdominal MRI at 1 year. This recommendation follows ACR consensus guidelines: Managing Incidental Findings on Abdominal CT: White Paper of the ACR Incidental Findings Committee. [place] [phone] Electronically Signed   By: Abigail Miyamoto M.D.   On: 03/20/2015 08:56    Scheduled Meds: . atenolol  12.5 mg Oral Daily  . enoxaparin (LOVENOX) injection  40 mg Subcutaneous Q24H  . famotidine (PEPCID) IV  20 mg Intravenous Q12H  . hydrocortisone-pramoxine  1 application Rectal TID  . [START ON 03/21/2015] pantoprazole  40 mg Oral Daily   Continuous Infusions: . sodium chloride 1,000 mL (03/19/15 1800)    Active Problems:   Nausea   Abdominal pain    Time spent: 25 minutes    [facility] Pager [phone]. If 7PM-7AM, please contact night-coverage at [email or web address], password [facility] 03/20/2015, 4:15 PM

## 2015-03-21 ENCOUNTER — Telehealth: Payer: Self-pay | Admitting: Cardiovascular Disease

## 2015-03-21 DIAGNOSIS — K625 Hemorrhage of anus and rectum: Secondary | ICD-10-CM

## 2015-03-21 DIAGNOSIS — R140 Abdominal distension (gaseous): Secondary | ICD-10-CM | POA: Diagnosis not present

## 2015-03-21 DIAGNOSIS — R1013 Epigastric pain: Secondary | ICD-10-CM | POA: Diagnosis not present

## 2015-03-21 DIAGNOSIS — R1032 Left lower quadrant pain: Secondary | ICD-10-CM | POA: Diagnosis not present

## 2015-03-21 LAB — BASIC METABOLIC PANEL
ANION GAP: 5 (ref 5–15)
BUN: 8 mg/dL (ref 6–20)
CALCIUM: 8.5 mg/dL — AB (ref 8.9–10.3)
CO2: 25 mmol/L (ref 22–32)
Chloride: 109 mmol/L (ref 101–111)
Creatinine, Ser: 1.11 mg/dL (ref 0.61–1.24)
GFR calc non Af Amer: >60 mL/min (ref >60)
Glucose, Bld: 94 mg/dL (ref 65–99)
Potassium: 3.9 mmol/L (ref 3.5–5.1)
Sodium: 139 mmol/L (ref 135–145)

## 2015-03-21 LAB — LACTIC ACID, PLASMA: Lactic Acid, Venous: 0.9 mmol/L (ref 0.5–2.0)

## 2015-03-21 LAB — LIPASE, BLOOD: Lipase: 21 U/L (ref 11–51)

## 2015-03-21 LAB — CBC
HEMATOCRIT: 41.5 % (ref 39.0–52.0)
Hemoglobin: 14 g/dL (ref 13.0–17.0)
MCH: 30.2 pg (ref 26.0–34.0)
MCHC: 33.7 g/dL (ref 30.0–36.0)
MCV: 89.4 fL (ref 78.0–100.0)
PLATELETS: 151 10*3/uL (ref 150–400)
RBC: 4.64 MIL/uL (ref 4.22–5.81)
RDW: 13.2 % (ref 11.5–15.5)
WBC: 5 10*3/uL (ref 4.0–10.5)

## 2015-03-21 LAB — HEPATIC FUNCTION PANEL
ALBUMIN: 3.3 g/dL — AB (ref 3.5–5.0)
ALT: 11 U/L — ABNORMAL LOW (ref 17–63)
AST: 12 U/L — AB (ref 15–41)
Alkaline Phosphatase: 39 U/L (ref 38–126)
BILIRUBIN TOTAL: 0.6 mg/dL (ref 0.3–1.2)
Bilirubin, Direct: <0.1 mg/dL — ABNORMAL LOW (ref 0.1–0.5)
TOTAL PROTEIN: 5.4 g/dL — AB (ref 6.5–8.1)

## 2015-03-21 LAB — LACTATE DEHYDROGENASE: LDH: 107 U/L (ref 98–192)

## 2015-03-21 LAB — PSA: PSA: 2.99 ng/mL (ref 0.00–4.00)

## 2015-03-21 MED ORDER — PEG-KCL-NACL-NASULF-NA ASC-C 100 G PO SOLR
0.5000 | Freq: Once | ORAL | Status: DC
  Filled 2015-03-21: qty 1

## 2015-03-21 MED ORDER — HYOSCYAMINE SULFATE 0.125 MG/5ML PO ELIX
0.2500 mg | ORAL_SOLUTION | Freq: Four times a day (QID) | ORAL | Status: DC
  Administered 2015-03-21 – 2015-03-22 (×5): 0.25 mg via ORAL
  Filled 2015-03-21 (×37): qty 10

## 2015-03-21 MED ORDER — POLYETHYLENE GLYCOL 3350 17 G PO PACK: 17.0000 g | PACK | Freq: Every day | ORAL | Status: DC

## 2015-03-21 MED ORDER — PEG-KCL-NACL-NASULF-NA ASC-C 100 G PO SOLR
0.5000 | Freq: Once | ORAL | Status: AC
  Administered 2015-03-22: 100 g via ORAL

## 2015-03-21 MED ORDER — RIFAXIMIN 550 MG PO TABS
550.0000 mg | ORAL_TABLET | Freq: Three times a day (TID) | ORAL | Status: DC
  Administered 2015-03-21 – 2015-03-22 (×4): 550 mg via ORAL
  Filled 2015-03-21 (×6): qty 1

## 2015-03-21 MED ORDER — PEG-KCL-NACL-NASULF-NA ASC-C 100 G PO SOLR: 1.0000 | Freq: Once | ORAL | Status: DC

## 2015-03-21 NOTE — Telephone Encounter (Signed)
Nothing to do for his appointment

## 2015-03-21 NOTE — Telephone Encounter (Signed)
Returned call to patient no answer.LMTC. 

## 2015-03-21 NOTE — Telephone Encounter (Signed)
Returned call to patient.He stated he currently is a patient at [facility] hospital.Stated he is having stomach problems.He had a CT scan done yesterday that revealed a lot of plaque build up in femoral artery.Stated he wanted Dr.Berry's advice if he needs to do anything before his appointment with him 04/04/15.Message sent to [facility] for advice.

## 2015-03-21 NOTE — Telephone Encounter (Signed)
Pt called in stating that he is in the hospital and after the doctors ran a CT Scan on his , they found "alot " of plaque build up in his femoral artery and they felt that he needed to f/u with the cardiologist. He wanted to know if he should keep his appt on 11/15 or come in sooner. Please f/u with him  Thanks

## 2015-03-21 NOTE — Progress Notes (Signed)
[facility] PROGRESS NOTE  Corey Fischer PHX:[id number] DOB: 11-Mar-1932 DOA: 03/19/2015 PCP: Garret Reddish, MD Brief history: Corey Fischer is a 81 y.o. male with prior h/o hypertension, diverticulosis, ITP, presents to ED with persistent symptoms of nausea and abdominal pain since 4 days prior to admission. As per the patient the abdominal pain started in the last week of September and was scheduled for an MRCP tomorrow , but he reports worsening pain associated with nausea and loose stools since 4 days. He called GI office and was asked to come to ED for further eval.   Assessment/Plan: 1. Epigastric pain/ nausea and gen malaise: - admitted to med surg.  - MRCP of the abdomen is unremarkable except for a small pseudocyst, with recommendations to follow upw with a repeat MRI in one year. -GI consulted for further recommendations.  - currently on IV pepcid, which the pt prefers than PPI.  - LIVER FUNCTION PANEL and lipase within normal limits.  - lactic acid and LDH are within normal limits.    Prostatomegaly: PSA is 2.99    symptomatic PVC'S : - resume atenelol.    Blood per rectum with BM earlier this am: Pt reports hemorrhoids  And that he was constipated.  He is  Scheduled for colonoscopy and EGD in am to evaluate for ischemic bowel and duodenitis respectively.  His lactic acid and LDH are normal.    Code Status: full code.  Family Communication: family  at bedside.  Disposition Plan: pending further investigation.    Consultants:  Gastroenterology.  Procedures:  MRCP OF THE ABDOMEN.   CT abdomen.  Antibiotics:  None   HPI/Subjective: pain and nausea improved , small amount of blood per rectum.   Objective: Filed Vitals:   03/21/15 1310  BP: 118/61  Pulse: 59  Temp: 97.9 F (36.6 C)  Resp: 16    Intake/Output Summary (Last 24 hours) at 03/21/15 1607 Last data filed at 03/21/15 0900  Gross per 24 hour  Intake    840 ml  Output   1300 ml  Net    -460 ml   Filed Weights   03/19/15 1854  Weight: 66.724 kg (147 lb 1.6 oz)    Exam:   General:  Alert afebrile comfortable  Cardiovascular: s1s2  Respiratory: ctab  Abdomen: soft  No tender ness today. Non distended bowel sounds heard.   Musculoskeletal: no pedal edema.   Data Reviewed: Basic Metabolic Panel:  Recent Labs Lab 03/19/15 1232 03/21/15 0400  NA 134* 139  K 4.1 3.9  CL 96* 109  CO2 27 25  GLUCOSE 165* 94  BUN 13 8  CREATININE 1.18 1.11  CALCIUM 10.3 8.5*   Liver Function Tests:  Recent Labs Lab 03/19/15 1232 03/21/15 0400  AST 23 12*  ALT 17 11*  ALKPHOS 59 39  BILITOT 0.9 0.6  PROT 7.6 5.4*  ALBUMIN 4.5 3.3*    Recent Labs Lab 03/19/15 1232 03/21/15 0400  LIPASE 30 21   No results for input(s): AMMONIA in the last 168 hours. CBC:  Recent Labs Lab 03/19/15 1232 03/21/15 1220  WBC 7.4 5.0  HGB 16.7 14.0  HCT 47.1 41.5  MCV 86.1 89.4  PLT 159 151   Cardiac Enzymes: No results for input(s): CKTOTAL, CKMB, CKMBINDEX, TROPONINI in the last 168 hours. BNP (last 3 results) No results for input(s): BNP in the last 8760 hours.  ProBNP (last 3 results) No results for input(s): PROBNP in the last 8760 hours.  CBG:  No results for input(s): GLUCAP in the last 168 hours.  No results found for this or any previous visit (from the past 240 hour(s)).   Studies: Ct Abdomen Pelvis W Contrast  03/20/2015  CLINICAL DATA:  81 year old with acute onset of left-sided abdominal pain several days ago, superimposed upon chronic intermittent epigastric abdominal pain. Surgical history includes cholecystectomy, appendectomy and TURP. EXAM: CT ABDOMEN AND PELVIS WITH CONTRAST TECHNIQUE: Multidetector CT imaging of the abdomen and pelvis was performed using the standard protocol following bolus administration of intravenous contrast. CONTRAST:  154m OMNIPAQUE IOHEXOL 300 MG/ML IV. Oral contrast was also administered. COMPARISON:  MRCP earlier same  date. CT abdomen and pelvis 04/24/2010. FINDINGS: Hepatobiliary: Liver normal in size and appearance. Gallbladder surgically absent. No biliary ductal dilation. Spleen:  Normal in size and appearance. Pancreas:  Normal in appearance.  No pancreatic ductal dilation. Adrenal glands:  Normal in appearance. Genitourinary: Both kidneys normal in size and appearance. No evidence of urinary tract calculi. Minimal perinephric edema, unchanged. Normal-appearing urinary bladder. Marked prostate gland enlargement, the gland measuring approximately 5.3 x 5.9 x 5.7 cm. Normal-appearing seminal vesicles. Gastrointestinal: Stomach mildly distended with oral contrast. Diverticulum arising from the posterior wall of the descending duodenum. Small bowel otherwise normal in appearance. Elongated and tortuous sigmoid colon. No visible colonic diverticula. Expected stool burden. Appendix surgically absent. Ascites:  Small amount of bland ascites dependently in the pelvis. Vascular: Severe aortoiliofemoral atherosclerosis that severe aortoiliac atherosclerosis without aneurysm. Retroaortic left renal vein. Visceral arteries atherosclerotic though patent. Accessory bilateral lower pole renal arteries noted. Lymphatic:  No pathologic lymphadenopathy in the abdomen or pelvis. Other findings: None. Musculoskeletal: Prior left hip arthroplasty with anatomic alignment. Degenerative changes involving the facet joints throughout the lumbar spine. Bone island left iliac bone as noted previously. Visualized lower thorax: Heart mildly enlarged. Severe 3 vessel coronary atherosclerosis. Right coronary artery stent. Linear scarring involving the lower lobes. IMPRESSION: 1. Small amount of bland ascites dependently in the pelvis, a nonspecific finding. 2. No acute abnormalities otherwise involving the abdomen or pelvis. 3. Marked prostate gland enlargement. 4. Diverticulum arising from the posterior wall of the descending duodenum. Electronically  Signed   By: TEvangeline DakinM.D.   On: 03/20/2015 20:54   Mr 3d Recon At Scanner  03/20/2015  CLINICAL DATA:  Worsening pain since September. Possible common duct stones. EXAM: MRI ABDOMEN WITHOUT AND WITH CONTRAST (INCLUDING MRCP) TECHNIQUE: Multiplanar multisequence MR imaging of the abdomen was performed both before and after the administration of intravenous contrast. Heavily T2-weighted images of the biliary and pancreatic ducts were obtained, and three-dimensional MRCP images were rendered by post processing. CONTRAST:  149mMULTIHANCE GADOBENATE DIMEGLUMINE 529 MG/ML IV SOLN COMPARISON:  CT of 04/24/2010 FINDINGS: Lower chest: Probable right base scarring. Borderline cardiomegaly, without pleural effusion. Hepatobiliary: Normal liver. Cholecystectomy. Normal common duct caliber, including 5 mm on image/series 66/4. No evidence of choledocholithiasis. Pancreas: No pancreatic duct dilatation or mass. A 6 mm pancreatic head cystic focus including on image/series 41/4. Spleen: Normal in size, without focal abnormality. Adrenals/Urinary Tract: Normal adrenal glands. Normal right kidney. An upper pole subcentimeter left renal cyst. No hydronephrosis. Stomach/Bowel: Underdistended stomach. Descending duodenal diverticulum. Otherwise normal large and small bowel. Vascular/Lymphatic: Aortic atherosclerosis. Retroaortic left renal vein. No retroperitoneal or retrocrural adenopathy. Other: No ascites. Musculoskeletal: No acute osseous abnormality. IMPRESSION: 1. Cholecystectomy without biliary duct dilatation or common duct stone. 2. No other explanation for abdominal pain. 3. 6 mm cystic focus in the pancreatic head is most  likely a tiny pseudocyst. Indolent cystic neoplasm could look similar. Per consensus criteria, this warrants followup with pre and post contrast abdominal MRI at 1 year. This recommendation follows ACR consensus guidelines: Managing Incidental Findings on Abdominal CT: White Paper of the ACR  Incidental Findings Committee. [place] [phone] Electronically Signed   By: Abigail Miyamoto M.D.   On: 03/20/2015 08:56   Mr Abd W/wo Cm/mrcp  03/20/2015  CLINICAL DATA:  Worsening pain since September. Possible common duct stones. EXAM: MRI ABDOMEN WITHOUT AND WITH CONTRAST (INCLUDING MRCP) TECHNIQUE: Multiplanar multisequence MR imaging of the abdomen was performed both before and after the administration of intravenous contrast. Heavily T2-weighted images of the biliary and pancreatic ducts were obtained, and three-dimensional MRCP images were rendered by post processing. CONTRAST:  76m MULTIHANCE GADOBENATE DIMEGLUMINE 529 MG/ML IV SOLN COMPARISON:  CT of 04/24/2010 FINDINGS: Lower chest: Probable right base scarring. Borderline cardiomegaly, without pleural effusion. Hepatobiliary: Normal liver. Cholecystectomy. Normal common duct caliber, including 5 mm on image/series 66/4. No evidence of choledocholithiasis. Pancreas: No pancreatic duct dilatation or mass. A 6 mm pancreatic head cystic focus including on image/series 41/4. Spleen: Normal in size, without focal abnormality. Adrenals/Urinary Tract: Normal adrenal glands. Normal right kidney. An upper pole subcentimeter left renal cyst. No hydronephrosis. Stomach/Bowel: Underdistended stomach. Descending duodenal diverticulum. Otherwise normal large and small bowel. Vascular/Lymphatic: Aortic atherosclerosis. Retroaortic left renal vein. No retroperitoneal or retrocrural adenopathy. Other: No ascites. Musculoskeletal: No acute osseous abnormality. IMPRESSION: 1. Cholecystectomy without biliary duct dilatation or common duct stone. 2. No other explanation for abdominal pain. 3. 6 mm cystic focus in the pancreatic head is most likely a tiny pseudocyst. Indolent cystic neoplasm could look similar. Per consensus criteria, this warrants followup with pre and post contrast abdominal MRI at 1 year. This recommendation follows ACR consensus guidelines:  Managing Incidental Findings on Abdominal CT: White Paper of the ACR Incidental Findings Committee. [phone] Signed   By: KAbigail MiyamotoM.D.   On: 03/20/2015 08:56    Scheduled Meds: . atenolol  12.5 mg Oral Daily  . enoxaparin (LOVENOX) injection  40 mg Subcutaneous Q24H  . famotidine (PEPCID) IV  20 mg Intravenous Q12H  . hydrocortisone-pramoxine  1 application Rectal TID  . hyoscyamine  0.25 mg Oral Q6H  . pantoprazole  40 mg Oral Daily  . peg 3350 powder  0.5 kit Oral Once   And  . [START ON 03/22/2015] peg 3350 powder  0.5 kit Oral Once  . polyethylene glycol  17 g Oral Daily  . rifaximin  550 mg Oral TID   Continuous Infusions: . sodium chloride 75 mL/hr at 03/21/15 0440    Active Problems:   Nausea   Abdominal pain   Abdominal pain, left lower quadrant   Epigastric pain    Time spent: 25 minutes    [facility] Pager [phone] If 7PM-7AM, please contact night-coverage at [email or web address], password [facility]11/05/2014, 4:07 PM

## 2015-03-21 NOTE — Progress Notes (Signed)
[facility] Progress Note  Subjective:    Afeb.Abd/pelvic CT yesterday with diverticulum post wall descending duodenum.Also with severe aortoiliac atherosclerosis without. aneurysm. Visceral arteries atherosclerotic though patent.Marked prostate enalargement. Continues to c/o/lower bad pain. Had a bloody BM last night.   Objective:  Vital signs in last 24 hours: Temp:  [97.8 F (36.6 C)-98.5 F (36.9 C)] 97.8 F (36.6 C) (11/01 0559) Pulse Rate:  [46-50] 48 (11/01 0559) Resp:  [16-20] 16 (11/01 0559) BP: (115-147)/(49-59) 124/56 mmHg (11/01 0559) SpO2:  [97 %-100 %] 99 % (11/01 0559) Last BM Date: 03/19/15 General:   Alert,  Well-developed,    in NAD Heart:  Regular rate and rhythm; no murmurs Pulm;lungs clear Abdomen:  Soft,mild diffuse lower abd TTP, nondistended. Normal bowel sounds, without guarding, and without rebound.   Extremities:  Without edema. Neurologic:Alert and  oriented x4;  grossly normal neurologically. Psych:  Alert and cooperative. Normal mood and affect.  Intake/Output from previous day: 10/31 0701 - 11/01 0700 In: 1685 [P.O.:960; I.V.:675; IV Piggyback:50] Out: 2200 [Urine:2200] Intake/Output this shift:    Lab Results:  Recent Labs  03/19/15 1232  WBC 7.4  HGB 16.7  HCT 47.1  PLT 159   BMET  Recent Labs  03/19/15 1232 03/21/15 0400  NA 134* 139  K 4.1 3.9  CL 96* 109  CO2 27 25  GLUCOSE 165* 94  BUN 13 8  CREATININE 1.18 1.11  CALCIUM 10.3 8.5*   LFT  Recent Labs  03/19/15 1232  PROT 7.6  ALBUMIN 4.5  AST 23  ALT 17  ALKPHOS 59  BILITOT 0.9    Ct Abdomen Pelvis W Contrast  03/20/2015  CLINICAL DATA:  81 year old with acute onset of left-sided abdominal pain several days ago, superimposed upon chronic intermittent epigastric abdominal pain. Surgical history includes cholecystectomy, appendectomy and TURP. EXAM: CT ABDOMEN AND PELVIS WITH CONTRAST TECHNIQUE: Multidetector CT imaging of the abdomen and  pelvis was performed using the standard protocol following bolus administration of intravenous contrast. CONTRAST:  141mL OMNIPAQUE IOHEXOL 300 MG/ML IV. Oral contrast was also administered. COMPARISON:  MRCP earlier same date. CT abdomen and pelvis 04/24/2010. FINDINGS: Hepatobiliary: Liver normal in size and appearance. Gallbladder surgically absent. No biliary ductal dilation. Spleen:  Normal in size and appearance. Pancreas:  Normal in appearance.  No pancreatic ductal dilation. Adrenal glands:  Normal in appearance. Genitourinary: Both kidneys normal in size and appearance. No evidence of urinary tract calculi. Minimal perinephric edema, unchanged. Normal-appearing urinary bladder. Marked prostate gland enlargement, the gland measuring approximately 5.3 x 5.9 x 5.7 cm. Normal-appearing seminal vesicles. Gastrointestinal: Stomach mildly distended with oral contrast. Diverticulum arising from the posterior wall of the descending duodenum. Small bowel otherwise normal in appearance. Elongated and tortuous sigmoid colon. No visible colonic diverticula. Expected stool burden. Appendix surgically absent. Ascites:  Small amount of bland ascites dependently in the pelvis. Vascular: Severe aortoiliofemoral atherosclerosis that severe aortoiliac atherosclerosis without aneurysm. Retroaortic left renal vein. Visceral arteries atherosclerotic though patent. Accessory bilateral lower pole renal arteries noted. Lymphatic:  No pathologic lymphadenopathy in the abdomen or pelvis. Other findings: None. Musculoskeletal: Prior left hip arthroplasty with anatomic alignment. Degenerative changes involving the facet joints throughout the lumbar spine. Bone island left iliac bone as noted previously. Visualized lower thorax: Heart mildly enlarged. Severe 3 vessel coronary atherosclerosis. Right coronary artery stent. Linear scarring involving the lower lobes. IMPRESSION: 1. Small amount of bland ascites dependently in the pelvis, a  nonspecific finding. 2. No acute abnormalities  otherwise involving the abdomen or pelvis. 3. Marked prostate gland enlargement. 4. Diverticulum arising from the posterior wall of the descending duodenum. Electronically Signed   By: Evangeline Dakin M.D.   On: 03/20/2015 20:54   Mr 3d Recon At Scanner  03/20/2015  CLINICAL DATA:  Worsening pain since September. Possible common duct stones. EXAM: MRI ABDOMEN WITHOUT AND WITH CONTRAST (INCLUDING MRCP) TECHNIQUE: Multiplanar multisequence MR imaging of the abdomen was performed both before and after the administration of intravenous contrast. Heavily T2-weighted images of the biliary and pancreatic ducts were obtained, and three-dimensional MRCP images were rendered by post processing. CONTRAST:  42mL MULTIHANCE GADOBENATE DIMEGLUMINE 529 MG/ML IV SOLN COMPARISON:  CT of 04/24/2010 FINDINGS: Lower chest: Probable right base scarring. Borderline cardiomegaly, without pleural effusion. Hepatobiliary: Normal liver. Cholecystectomy. Normal common duct caliber, including 5 mm on image/series 66/4. No evidence of choledocholithiasis. Pancreas: No pancreatic duct dilatation or mass. A 6 mm pancreatic head cystic focus including on image/series 41/4. Spleen: Normal in size, without focal abnormality. Adrenals/Urinary Tract: Normal adrenal glands. Normal right kidney. An upper pole subcentimeter left renal cyst. No hydronephrosis. Stomach/Bowel: Underdistended stomach. Descending duodenal diverticulum. Otherwise normal large and small bowel. Vascular/Lymphatic: Aortic atherosclerosis. Retroaortic left renal vein. No retroperitoneal or retrocrural adenopathy. Other: No ascites. Musculoskeletal: No acute osseous abnormality. IMPRESSION: 1. Cholecystectomy without biliary duct dilatation or common duct stone. 2. No other explanation for abdominal pain. 3. 6 mm cystic focus in the pancreatic head is most likely a tiny pseudocyst. Indolent cystic neoplasm could look similar. Per  consensus criteria, this warrants followup with pre and post contrast abdominal MRI at 1 year. This recommendation follows ACR consensus guidelines: Managing Incidental Findings on Abdominal CT: White Paper of the ACR Incidental Findings Committee. [place] [phone] Electronically Signed   By: Abigail Miyamoto M.D.   On: 03/20/2015 08:56   Mr Abd W/wo Cm/mrcp  03/20/2015  CLINICAL DATA:  Worsening pain since September. Possible common duct stones. EXAM: MRI ABDOMEN WITHOUT AND WITH CONTRAST (INCLUDING MRCP) TECHNIQUE: Multiplanar multisequence MR imaging of the abdomen was performed both before and after the administration of intravenous contrast. Heavily T2-weighted images of the biliary and pancreatic ducts were obtained, and three-dimensional MRCP images were rendered by post processing. CONTRAST:  15mL MULTIHANCE GADOBENATE DIMEGLUMINE 529 MG/ML IV SOLN COMPARISON:  CT of 04/24/2010 FINDINGS: Lower chest: Probable right base scarring. Borderline cardiomegaly, without pleural effusion. Hepatobiliary: Normal liver. Cholecystectomy. Normal common duct caliber, including 5 mm on image/series 66/4. No evidence of choledocholithiasis. Pancreas: No pancreatic duct dilatation or mass. A 6 mm pancreatic head cystic focus including on image/series 41/4. Spleen: Normal in size, without focal abnormality. Adrenals/Urinary Tract: Normal adrenal glands. Normal right kidney. An upper pole subcentimeter left renal cyst. No hydronephrosis. Stomach/Bowel: Underdistended stomach. Descending duodenal diverticulum. Otherwise normal large and small bowel. Vascular/Lymphatic: Aortic atherosclerosis. Retroaortic left renal vein. No retroperitoneal or retrocrural adenopathy. Other: No ascites. Musculoskeletal: No acute osseous abnormality. IMPRESSION: 1. Cholecystectomy without biliary duct dilatation or common duct stone. 2. No other explanation for abdominal pain. 3. 6 mm cystic focus in the pancreatic head is most  likely a tiny pseudocyst. Indolent cystic neoplasm could look similar. Per consensus criteria, this warrants followup with pre and post contrast abdominal MRI at 1 year. This recommendation follows ACR consensus guidelines: Managing Incidental Findings on Abdominal CT: White Paper of the ACR Incidental Findings Committee. [place] [phone] Electronically Signed   By: Abigail Miyamoto M.D.   On: 03/20/2015  08:56    ASSESSMENT/PLAN:    81 year old male status post several episodes of pancreatitis of unclear etiology, the most recent of which was 3 weeks ago. He is status post cholecystectomy, negative EUS February 2012. Question of pancreatitis where secondary to medications, statin has been discontinued. Patient was scheduled for MRCP as an outpatient but due to recurrent pain this weekend was admitted. Lipase on admission was 30 and liver enzymes were normal. MRCP is nonrevealing.CT with small bowel diverticulum--? nidus for SIBO. Will give trial of xifaxan 550 mg tid x 14 days for possible SIBO. Would continue levsin for cramping. Miralax to avoid constipation. Recheck lipase and hpatic function panel. Advance to full liquid diet.Prostate noted to be enalrged--pt says he has urologist at [facility] urology. Will check PSA. ?if atherosclerotic changes may be causing some intestinal angina type pain. ? Role of colonoscopy to eval for ischemic change of colon, as weell as possible EGD to eval for ulcer, doudenitis, etc. Does not seem to have pancreatitis at this time--repeat lipase, hepatic panel pending.Will need to follow with urology as outpt for enlarged prostate.      Tekeya Geffert, Deloris Ping 03/21/2015, Pager [phone] Mon-Fri 8a-5p [phone] after 5p, weekends, holidays

## 2015-03-22 ENCOUNTER — Encounter (HOSPITAL_COMMUNITY)
Admission: EM | Disposition: A | Discharge status: Home / Self Care | Payer: Self-pay | Source: Home / Self Care | Attending: Emergency Medicine

## 2015-03-22 ENCOUNTER — Observation Stay (HOSPITAL_COMMUNITY): Payer: Medicare Other | Admitting: Anesthesiology

## 2015-03-22 ENCOUNTER — Encounter (HOSPITAL_COMMUNITY): Payer: Self-pay

## 2015-03-22 DIAGNOSIS — R1310 Dysphagia, unspecified: Secondary | ICD-10-CM | POA: Diagnosis not present

## 2015-03-22 DIAGNOSIS — K219 Gastro-esophageal reflux disease without esophagitis: Secondary | ICD-10-CM

## 2015-03-22 DIAGNOSIS — R1032 Left lower quadrant pain: Secondary | ICD-10-CM | POA: Diagnosis not present

## 2015-03-22 DIAGNOSIS — D124 Benign neoplasm of descending colon: Secondary | ICD-10-CM | POA: Diagnosis not present

## 2015-03-22 DIAGNOSIS — Q394 Esophageal web: Secondary | ICD-10-CM

## 2015-03-22 DIAGNOSIS — R1013 Epigastric pain: Secondary | ICD-10-CM | POA: Diagnosis not present

## 2015-03-22 DIAGNOSIS — K625 Hemorrhage of anus and rectum: Secondary | ICD-10-CM | POA: Diagnosis not present

## 2015-03-22 DIAGNOSIS — R110 Nausea: Secondary | ICD-10-CM | POA: Diagnosis not present

## 2015-03-22 DIAGNOSIS — K222 Esophageal obstruction: Secondary | ICD-10-CM | POA: Diagnosis not present

## 2015-03-22 DIAGNOSIS — R109 Unspecified abdominal pain: Secondary | ICD-10-CM | POA: Diagnosis not present

## 2015-03-22 DIAGNOSIS — R112 Nausea with vomiting, unspecified: Secondary | ICD-10-CM | POA: Diagnosis not present

## 2015-03-22 DIAGNOSIS — K921 Melena: Secondary | ICD-10-CM | POA: Diagnosis not present

## 2015-03-22 DIAGNOSIS — R140 Abdominal distension (gaseous): Secondary | ICD-10-CM | POA: Diagnosis not present

## 2015-03-22 HISTORY — PX: COLONOSCOPY WITH PROPOFOL: SHX5780

## 2015-03-22 HISTORY — PX: ESOPHAGOGASTRODUODENOSCOPY (EGD) WITH PROPOFOL: SHX5813

## 2015-03-22 LAB — COMPREHENSIVE METABOLIC PANEL
ALK PHOS: 40 U/L (ref 38–126)
ALT: 15 U/L — AB (ref 17–63)
ANION GAP: 5 (ref 5–15)
AST: 16 U/L (ref 15–41)
Albumin: 3.3 g/dL — ABNORMAL LOW (ref 3.5–5.0)
BILIRUBIN TOTAL: 0.9 mg/dL (ref 0.3–1.2)
BUN: 8 mg/dL (ref 6–20)
CALCIUM: 8.1 mg/dL — AB (ref 8.9–10.3)
CO2: 25 mmol/L (ref 22–32)
CREATININE: 1.13 mg/dL (ref 0.61–1.24)
Chloride: 108 mmol/L (ref 101–111)
GFR calc non Af Amer: 59 mL/min — ABNORMAL LOW (ref >60)
Glucose, Bld: 93 mg/dL (ref 65–99)
Potassium: 3.7 mmol/L (ref 3.5–5.1)
Sodium: 138 mmol/L (ref 135–145)
TOTAL PROTEIN: 5.6 g/dL — AB (ref 6.5–8.1)

## 2015-03-22 LAB — LIPASE, BLOOD: Lipase: 22 U/L (ref 11–51)

## 2015-03-22 SURGERY — COLONOSCOPY WITH PROPOFOL
Anesthesia: Monitor Anesthesia Care

## 2015-03-22 MED ORDER — PROPOFOL 10 MG/ML IV BOLUS
INTRAVENOUS | Status: AC
  Filled 2015-03-22: qty 20

## 2015-03-22 MED ORDER — PROPOFOL 10 MG/ML IV BOLUS
INTRAVENOUS | Status: DC | PRN
  Administered 2015-03-22: 50 mg via INTRAVENOUS

## 2015-03-22 MED ORDER — LACTATED RINGERS IV SOLN
INTRAVENOUS | Status: DC | PRN
  Administered 2015-03-22: 13:00:00 via INTRAVENOUS

## 2015-03-22 MED ORDER — LIDOCAINE HCL (CARDIAC) 20 MG/ML IV SOLN
INTRAVENOUS | Status: DC | PRN
Start: 2015-03-22 — End: 2015-03-22
  Administered 2015-03-22: 50 mg via INTRAVENOUS

## 2015-03-22 MED ORDER — PROMETHAZINE HCL 25 MG/ML IJ SOLN: 6.2500 mg | INTRAMUSCULAR | Status: DC | PRN

## 2015-03-22 MED ORDER — PANTOPRAZOLE SODIUM 40 MG PO TBEC: 40.0000 mg | DELAYED_RELEASE_TABLET | Freq: Every day | ORAL | Status: DC

## 2015-03-22 MED ORDER — RIFAXIMIN 550 MG PO TABS: 550.0000 mg | ORAL_TABLET | Freq: Three times a day (TID) | ORAL | Status: DC

## 2015-03-22 MED ORDER — PROPOFOL 500 MG/50ML IV EMUL
INTRAVENOUS | Status: DC | PRN
  Administered 2015-03-22: 120 ug/kg/min via INTRAVENOUS

## 2015-03-22 MED ORDER — HYOSCYAMINE SULFATE 0.125 MG/5ML PO ELIX: 0.2500 mg | ORAL_SOLUTION | Freq: Four times a day (QID) | ORAL | Status: DC

## 2015-03-22 MED ORDER — SODIUM CHLORIDE 0.9 % IV SOLN: INTRAVENOUS | Status: DC

## 2015-03-22 MED ORDER — LIDOCAINE HCL (CARDIAC) 20 MG/ML IV SOLN
INTRAVENOUS | Status: AC
  Filled 2015-03-22: qty 5

## 2015-03-22 SURGICAL SUPPLY — 25 items

## 2015-03-22 NOTE — Telephone Encounter (Signed)
Received call from patient.Dr.Berry advised nothing to do.Keep appointment with him 04/04/15 at 2:15 pm.

## 2015-03-22 NOTE — Op Note (Addendum)
[place], [id number]   ENDOSCOPY PROCEDURE REPORT  PATIENT: Corey Fischer, Corey Fischer  MR#: [id number] BIRTHDATE: 17-May-1932 , 10  yrs. old GENDER: male ENDOSCOPIST: Jerene Bears, MD PROCEDURE DATE:  03/22/2015 PROCEDURE:  EGD, diagnostic and EGD w/ balloon dilation ASA CLASS:     Class III INDICATIONS:  history of GERD, epigastric pain, and dysphagia. MEDICATIONS: Monitored anesthesia care and Per Anesthesia TOPICAL ANESTHETIC: none  DESCRIPTION OF PROCEDURE: After the risks benefits and alternatives of the procedure were thoroughly explained, informed consent was obtained.  The Pentax Gastroscope [id number] endoscope was introduced through the mouth and advanced to the second portion of the duodenum , Without limitations.  The instrument was slowly withdrawn as the mucosa was fully examined.      ESOPHAGUS: A Schatzki's ring was found 38 cm from the incisors. Using a TTS-balloon the stricture was dilated up to 27mm (after 15 mm and 16.5 mm offered no resistance to the inflated balloon).  The balloon was held inflated for 60 seconds.  Following this dilation, there was a small mucosal rent on opposite walls of the ring.   The mucosa of the esophagus appeared otherwise normal.  STOMACH: A small hiatal hernia was noted.   The mucosa of the stomach appeared normal.  DUODENUM: The duodenal mucosa showed no abnormalities in the bulb and 2nd part of the duodenum.  Retroflexed views revealed a hiatal hernia.     The scope was then withdrawn from the patient and the procedure completed.  COMPLICATIONS: There were no immediate complications.   ENDOSCOPIC IMPRESSION: 1.   Schatzki ring was found 38 cm from the incisors; balloon dilation to 18 mm 2.   The mucosa of the esophagus appeared normal 3.   Small hiatal hernia 4.   The mucosa of the stomach appeared normal 5.   The duodenal mucosa showed no abnormalities in the bulb and 2nd part of the  duodenum  RECOMMENDATIONS: 1.  Daily PPI 2.  Proceed with a Colonoscopy.  eSigned:  Jerene Bears, MD 03/22/2015 1:49 PM Revised: 03/22/2015 1:49 PM   CC: the patient, Dr. Henrene Pastor, PCP  PATIENT NAME:  Corey Fischer, Corey Fischer MR#: [id number]

## 2015-03-22 NOTE — Progress Notes (Signed)
Pt discharged home with spouse in stable condition. Discharge instructions and scripts given. Pt and wife verbalized understanding  

## 2015-03-22 NOTE — Transfer of Care (Signed)
Immediate Anesthesia Transfer of Care Note  Patient: Corey Fischer  Procedure(s) Performed: Procedure(s): COLONOSCOPY WITH PROPOFOL (N/A) ESOPHAGOGASTRODUODENOSCOPY (EGD) WITH PROPOFOL (N/A)  Patient Location: PACU  Anesthesia Type:MAC  Level of Consciousness: awake, alert  and oriented  Airway & Oxygen Therapy: Patient Spontanous Breathing and Patient connected to nasal cannula oxygen  Post-op Assessment: Report given to RN and Post -op Vital signs reviewed and stable  Post vital signs: Reviewed and stable  Last Vitals:  Filed Vitals:   03/22/15 1225  BP: 181/61  Pulse: 51  Temp: 36.7 C  Resp: 14    Complications: No apparent anesthesia complications

## 2015-03-22 NOTE — Progress Notes (Signed)
Tolerated bowel prep with no N/V. Says BMs toward end were clear. For colon/EGD later today.  Carlean Jews Mcgwire Dasaro, PA-C [facility]

## 2015-03-22 NOTE — Anesthesia Postprocedure Evaluation (Signed)
  Anesthesia Post-op Note  Patient: Corey Fischer  Procedure(s) Performed: Procedure(s) (LRB): COLONOSCOPY WITH PROPOFOL (N/A) ESOPHAGOGASTRODUODENOSCOPY (EGD) WITH PROPOFOL (N/A)  Patient Location: PACU  Anesthesia Type: MAC   Level of Consciousness: awake and alert   Airway and Oxygen Therapy: Patient Spontanous Breathing  Post-op Pain: mild  Post-op Assessment: Post-op Vital signs reviewed, Patient's Cardiovascular Status Stable, Respiratory Function Stable, Patent Airway and No signs of Nausea or vomiting  Last Vitals:  Filed Vitals:   03/22/15 1410  BP: 159/55  Pulse: 44  Temp:   Resp: 11    Post-op Vital Signs: stable   Complications: No apparent anesthesia complications

## 2015-03-22 NOTE — Op Note (Signed)
[facility] [place], [address]   COLONOSCOPY PROCEDURE REPORT  PATIENT: Corey Fischer, Corey Fischer  MR#: [id number] BIRTHDATE: [birth year] , 83  yrs. old GENDER: male ENDOSCOPIST: Jerene Bears, MD PROCEDURE DATE:  03/22/2015 PROCEDURE:   Colonoscopy, diagnostic and Colonoscopy with snare polypectomy First Screening Colonoscopy - Avg.  risk and is 50 yrs.  old or older - No.  Prior Negative Screening - Now for repeat screening. N/A  History of Adenoma - Now for follow-up colonoscopy & has been > or = to 3 yrs.  N/A  Polyps removed today? Yes ASA CLASS:   Class III INDICATIONS:lower abd pain, bloating, rectal bleeding MEDICATIONS: Monitored anesthesia care and Per Anesthesia  DESCRIPTION OF PROCEDURE:   After the risks benefits and alternatives of the procedure were thoroughly explained, informed consent was obtained.  The digital rectal exam revealed no rectal mass.   The Pentax Ped Colon [id number]  endoscope was introduced through the anus and advanced to the cecum, which was identified by both the appendix and ileocecal valve. No adverse events experienced.   The quality of the prep was good.  (MoviPrep was used)  The instrument was then slowly withdrawn as the colon was fully examined. Estimated blood loss is zero unless otherwise noted in this procedure report.   COLON FINDINGS: A sessile polyp measuring 5 mm in size was found in the descending colon.  A polypectomy was performed with a cold snare.  The resection was complete, the polyp tissue was completely retrieved and sent to histology.   There was mild diverticulosis noted at the hepatic flexure.   The examination was otherwise normal with no evidence mucosal ischemia.  Retroflexed views revealed internal hemorrhoids. The time to cecum = 5.3 Withdrawal time = 8.7   The scope was withdrawn and the procedure completed. COMPLICATIONS: There were no immediate complications.  ENDOSCOPIC IMPRESSION: 1.    Sessile polyp was found in the descending colon; polypectomy was performed with a cold snare 2.   Mild diverticulosis was noted at the hepatic flexure 3.   The examination was otherwise normal with normal appearing colonic mucosa 4.   Internal hemorrhoids  RECOMMENDATIONS: 1.  Complete trial of Rifaximin 550 mg TID x 14 days 2.  Await pathology results 3.  Probiotic daily after completion of antibiotics 4.  Hemorrhoidal banding can be considered if hemorrhoidal bleeding is a frequent problem 5.  Follow-up with Dr. Henrene Pastor  eSigned:  Jerene Bears, MD 03/22/2015 2:06 PM   cc:  the patient, Dr. Henrene Pastor, PCP

## 2015-03-22 NOTE — Anesthesia Preprocedure Evaluation (Signed)
Anesthesia Evaluation  Patient identified by MRN, date of birth, ID band Patient awake    Reviewed: Allergy & Precautions, NPO status , Patient's Chart, lab work & pertinent test results  Airway Mallampati: II  TM Distance: >3 FB Neck ROM: Full    Dental no notable dental hx.    Pulmonary neg pulmonary ROS,    Pulmonary exam normal breath sounds clear to auscultation       Cardiovascular hypertension, Pt. on medications + Peripheral Vascular Disease  Normal cardiovascular exam Rhythm:Regular Rate:Normal  Symptomatic PVCs  a. Per report, monitor 05/2012 - NSR with symptomatic PVC. b. Echo 05/2012- EF 55-60%, aortic sclerosis without stenosis. c. Nuc 12/2012 - normal.     Neuro/Psych negative neurological ROS  negative psych ROS   GI/Hepatic negative GI ROS, Neg liver ROS,   Endo/Other  negative endocrine ROS  Renal/GU negative Renal ROS  negative genitourinary   Musculoskeletal negative musculoskeletal ROS (+)   Abdominal   Peds negative pediatric ROS (+)  Hematology Chronic ITP (idiopathic thrombocytopenia) (HCC   Anesthesia Other Findings   Reproductive/Obstetrics negative OB ROS                             Anesthesia Physical Anesthesia Plan  ASA: III  Anesthesia Plan: MAC   Post-op Pain Management:    Induction: Intravenous  Airway Management Planned: Nasal Cannula  Additional Equipment:   Intra-op Plan:   Post-operative Plan: Extubation in OR  Informed Consent: I have reviewed the patients History and Physical, chart, labs and discussed the procedure including the risks, benefits and alternatives for the proposed anesthesia with the patient or authorized representative who has indicated his/her understanding and acceptance.     Plan Discussed with: CRNA and Surgeon  Anesthesia Plan Comments:         Anesthesia Quick Evaluation

## 2015-03-23 ENCOUNTER — Encounter (HOSPITAL_COMMUNITY): Payer: Self-pay | Admitting: Internal Medicine

## 2015-03-23 ENCOUNTER — Encounter: Payer: Self-pay | Admitting: Internal Medicine

## 2015-03-24 ENCOUNTER — Telehealth: Payer: Self-pay | Admitting: Family Medicine

## 2015-03-24 NOTE — Telephone Encounter (Signed)
Pt has been sch

## 2015-03-24 NOTE — Telephone Encounter (Signed)
8:15 on 8th work?

## 2015-03-24 NOTE — Discharge Summary (Signed)
Physician Discharge Summary  Corey Fischer BSJ:[id number] DOB: 1931-06-01 DOA: 03/19/2015  PCP: Garret Reddish, MD  Admit date: 03/19/2015 Discharge date: 03/22/2015  Time spent: 35 minutes  Recommendations for Outpatient Follow-up:    Follow up with [facility]- Hvozdovic, Vita Barley, PA-C On 04/21/2015.      Follow up with Garret Reddish, MD. Schedule an appointment as soon as possible for a visit in 1 week.      Discharge Diagnoses:  Active Problems:   Nausea   Abdominal pain   Abdominal pain, left lower quadrant   Epigastric pain   Rectal bleeding   Abdominal bloating   Gastroesophageal reflux disease without esophagitis   Dysphagia   Schatzki's ring   Benign neoplasm of descending colon   Discharge Condition: stable  Diet recommendation: soft, bland diet  Filed Weights   03/19/15 1854  Weight: 66.724 kg (147 lb 1.6 oz)    History of present illness:  Chief Complaint: nausea and abdominal pain since 4 days.  HPI: Corey Fischer is a 81 y.o. male with prior h/o hypertension, diverticulosis, ITP, presented to ED with persistent symptoms of nausea and abdominal pain since 4 days. Per the patient the abdominal pain started in the last week of September and was scheduled for an MRCP tomorrow , but he reported worsening pain associated with nausea and loose stools since 4 days. He called GI office and was asked to come to ED   Hospital Course:   Abdominal pain -intermittent episodes since september, etiology unclear -[facility] consulted,  treated with supportive care -CT scan revealed extensive abdominal and pelvic/lower extremity atherosclerosis -MRCP of the abdomen is unremarkable except for a small pseudocyst, with recommendations to follow upw with a repeat MRI in one year. -was felt that -it was possible he is having chronic mesenteric ischemia though he has not had significant weight loss. -Underwent EGD: Schatzki ring was found 38 cm from the incisors; s/p balloon dilation  to 18 mm, other wise unremarkable -Colonoscopy : Sessile polyp was found in the descending colon; s/p polypectomy, mild divetriculosis -GI recommended trial of Rifaximin 550 mg TID x 14 days and levsin as needed -he is scheduled t FU with [facility] -improved and tolerating diet at the time of discharge  Rest of his medical problems were stable  Procedures:11/2  EGD: Schatzki ring was found 38 cm from the incisors; s/p balloon dilation to 18 mm, other wise unremarkable  -Colonoscopy : Sessile polyp was found in the descending colon; s/p polypectomy, mild divetriculosis  Consultations:   GI  Discharge Exam: Filed Vitals:   03/22/15 1436  BP: 169/54  Pulse: 42  Temp: 97.4 F (36.3 C)  Resp: 16    General:AAOx3 Cardiovascular: S1S2/RRR Respiratory: CTAB  Discharge Instructions   Discharge Instructions    Diet - low sodium heart healthy    Complete by:  As directed      Increase activity slowly    Complete by:  As directed           Discharge Medication List as of 03/22/2015  4:35 PM    START taking these medications   Details  hyoscyamine (LEVSIN) 0.125 MG/5ML ELIX Take 10 mLs (0.25 mg total) by mouth every 6 (six) hours. As needed for abdominal cramps, Starting 03/22/2015, Until Discontinued, Print    pantoprazole (PROTONIX) 40 MG tablet Take 1 tablet (40 mg total) by mouth daily., Starting 03/22/2015, Until Discontinued, Print    rifaximin (XIFAXAN) 550 MG TABS tablet Take  1 tablet (550 mg total) by mouth 3 (three) times daily. For 14days, Starting 03/22/2015, Until Discontinued, Print      CONTINUE these medications which have NOT CHANGED   Details  amLODipine (NORVASC) 2.5 MG tablet TAKE 1 TABLET BY MOUTH AT BEDTIME, Normal    aspirin 81 MG tablet Take 81 mg by mouth daily., Until Discontinued, Historical Med    atenolol (TENORMIN) 25 MG tablet Take 0.5 tablets (12.5 mg total) by mouth daily., Starting 03/11/2014, Until Discontinued, Normal     cetirizine (ZYRTEC) 10 MG tablet Take 10 mg by mouth daily with breakfast. , Until Discontinued, Historical Med    Cholecalciferol (VITAMIN D3) 1000 UNITS CAPS Take 2,000 Units by mouth daily. , Until Discontinued, Historical Med    Coenzyme Q10 50 MG CAPS Take 250 mg by mouth daily. , Until Discontinued, Historical Med    Cyanocobalamin 2500 MCG SUBL Place 2,500 mcg under the tongue daily. , Until Discontinued, Historical Med    diazepam (VALIUM) 5 MG tablet Take 2.5-5 mg by mouth at bedtime as needed for anxiety or sedation., Until Discontinued, Historical Med    famotidine (PEPCID) 40 MG tablet TAKE 1 TABLET BY MOUTH TWICE DAILY., Normal    hydrocortisone-pramoxine (ANALPRAM-HC) 2.5-1 % rectal cream Place 1 application rectally 3 (three) times daily., Until Discontinued, Historical Med    Magnesium 200 MG TABS Take 200 mg by mouth daily. , Until Discontinued, Historical Med    polyethylene glycol (MIRALAX / GLYCOLAX) packet Take 17 g by mouth daily. , Until Discontinued, Historical Med    promethazine (PHENERGAN) 25 MG suppository Place 25 mg rectally every 6 (six) hours as needed for nausea or vomiting., Until Discontinued, Historical Med    simvastatin (ZOCOR) 20 MG tablet Take 20 mg by mouth every morning., Until Discontinued, Historical Med    Zinc 25 MG TABS Take 25 mg by mouth daily. , Until Discontinued, Historical Med    Ascorbic Acid (VITAMIN C) 1000 MG tablet Take 1,000 mg by mouth daily. , Until Discontinued, Historical Med       Allergies  Allergen Reactions  . Ciprofloxacin Other (See Comments)    Unknown reaction   Follow-up Information    Follow up with Hvozdovic, Vita Barley, PA-C On 04/21/2015.   Specialty:  Gastroenterology   Why:  appt at 8:45--be there at 8:30   Contact information:   [place] [id number] [phone]       Follow up with Garret Reddish, MD. Schedule an appointment as soon as possible for a visit in 1 week.    Specialty:  Family Medicine   Contact information:   [address] [place] [address] [phone]        The results of significant diagnostics from this hospitalization (including imaging, microbiology, ancillary and laboratory) are listed below for reference.    Significant Diagnostic Studies: Ct Abdomen Pelvis W Contrast  03/20/2015  CLINICAL DATA:  81 year old with acute onset of left-sided abdominal pain several days ago, superimposed upon chronic intermittent epigastric abdominal pain. Surgical history includes cholecystectomy, appendectomy and TURP. EXAM: CT ABDOMEN AND PELVIS WITH CONTRAST TECHNIQUE: Multidetector CT imaging of the abdomen and pelvis was performed using the standard protocol following bolus administration of intravenous contrast. CONTRAST:  114mL OMNIPAQUE IOHEXOL 300 MG/ML IV. Oral contrast was also administered. COMPARISON:  MRCP earlier same date. CT abdomen and pelvis 04/24/2010. FINDINGS: Hepatobiliary: Liver normal in size and appearance. Gallbladder surgically absent. No biliary ductal dilation. Spleen:  Normal  in size and appearance. Pancreas:  Normal in appearance.  No pancreatic ductal dilation. Adrenal glands:  Normal in appearance. Genitourinary: Both kidneys normal in size and appearance. No evidence of urinary tract calculi. Minimal perinephric edema, unchanged. Normal-appearing urinary bladder. Marked prostate gland enlargement, the gland measuring approximately 5.3 x 5.9 x 5.7 cm. Normal-appearing seminal vesicles. Gastrointestinal: Stomach mildly distended with oral contrast. Diverticulum arising from the posterior wall of the descending duodenum. Small bowel otherwise normal in appearance. Elongated and tortuous sigmoid colon. No visible colonic diverticula. Expected stool burden. Appendix surgically absent. Ascites:  Small amount of bland ascites dependently in the pelvis. Vascular: Severe aortoiliofemoral atherosclerosis that severe aortoiliac  atherosclerosis without aneurysm. Retroaortic left renal vein. Visceral arteries atherosclerotic though patent. Accessory bilateral lower pole renal arteries noted. Lymphatic:  No pathologic lymphadenopathy in the abdomen or pelvis. Other findings: None. Musculoskeletal: Prior left hip arthroplasty with anatomic alignment. Degenerative changes involving the facet joints throughout the lumbar spine. Bone island left iliac bone as noted previously. Visualized lower thorax: Heart mildly enlarged. Severe 3 vessel coronary atherosclerosis. Right coronary artery stent. Linear scarring involving the lower lobes. IMPRESSION: 1. Small amount of bland ascites dependently in the pelvis, a nonspecific finding. 2. No acute abnormalities otherwise involving the abdomen or pelvis. 3. Marked prostate gland enlargement. 4. Diverticulum arising from the posterior wall of the descending duodenum. Electronically Signed   By: Evangeline Dakin M.D.   On: 03/20/2015 20:54   Mr 3d Recon At Scanner  03/20/2015  CLINICAL DATA:  Worsening pain since September. Possible common duct stones. EXAM: MRI ABDOMEN WITHOUT AND WITH CONTRAST (INCLUDING MRCP) TECHNIQUE: Multiplanar multisequence MR imaging of the abdomen was performed both before and after the administration of intravenous contrast. Heavily T2-weighted images of the biliary and pancreatic ducts were obtained, and three-dimensional MRCP images were rendered by post processing. CONTRAST:  61mL MULTIHANCE GADOBENATE DIMEGLUMINE 529 MG/ML IV SOLN COMPARISON:  CT of 04/24/2010 FINDINGS: Lower chest: Probable right base scarring. Borderline cardiomegaly, without pleural effusion. Hepatobiliary: Normal liver. Cholecystectomy. Normal common duct caliber, including 5 mm on image/series 66/4. No evidence of choledocholithiasis. Pancreas: No pancreatic duct dilatation or mass. A 6 mm pancreatic head cystic focus including on image/series 41/4. Spleen: Normal in size, without focal abnormality.  Adrenals/Urinary Tract: Normal adrenal glands. Normal right kidney. An upper pole subcentimeter left renal cyst. No hydronephrosis. Stomach/Bowel: Underdistended stomach. Descending duodenal diverticulum. Otherwise normal large and small bowel. Vascular/Lymphatic: Aortic atherosclerosis. Retroaortic left renal vein. No retroperitoneal or retrocrural adenopathy. Other: No ascites. Musculoskeletal: No acute osseous abnormality. IMPRESSION: 1. Cholecystectomy without biliary duct dilatation or common duct stone. 2. No other explanation for abdominal pain. 3. 6 mm cystic focus in the pancreatic head is most likely a tiny pseudocyst. Indolent cystic neoplasm could look similar. Per consensus criteria, this warrants followup with pre and post contrast abdominal MRI at 1 year. This recommendation follows ACR consensus guidelines: Managing Incidental Findings on Abdominal CT: White Paper of the ACR Incidental Findings Committee. [place] [phone] Electronically Signed   By: Abigail Miyamoto M.D.   On: 03/20/2015 08:56   Mr Abd W/wo Cm/mrcp  03/20/2015  CLINICAL DATA:  Worsening pain since September. Possible common duct stones. EXAM: MRI ABDOMEN WITHOUT AND WITH CONTRAST (INCLUDING MRCP) TECHNIQUE: Multiplanar multisequence MR imaging of the abdomen was performed both before and after the administration of intravenous contrast. Heavily T2-weighted images of the biliary and pancreatic ducts were obtained, and three-dimensional MRCP images were rendered by post processing. CONTRAST:  64mL MULTIHANCE GADOBENATE DIMEGLUMINE 529 MG/ML IV SOLN COMPARISON:  CT of 04/24/2010 FINDINGS: Lower chest: Probable right base scarring. Borderline cardiomegaly, without pleural effusion. Hepatobiliary: Normal liver. Cholecystectomy. Normal common duct caliber, including 5 mm on image/series 66/4. No evidence of choledocholithiasis. Pancreas: No pancreatic duct dilatation or mass. A 6 mm pancreatic head cystic focus including on  image/series 41/4. Spleen: Normal in size, without focal abnormality. Adrenals/Urinary Tract: Normal adrenal glands. Normal right kidney. An upper pole subcentimeter left renal cyst. No hydronephrosis. Stomach/Bowel: Underdistended stomach. Descending duodenal diverticulum. Otherwise normal large and small bowel. Vascular/Lymphatic: Aortic atherosclerosis. Retroaortic left renal vein. No retroperitoneal or retrocrural adenopathy. Other: No ascites. Musculoskeletal: No acute osseous abnormality. IMPRESSION: 1. Cholecystectomy without biliary duct dilatation or common duct stone. 2. No other explanation for abdominal pain. 3. 6 mm cystic focus in the pancreatic head is most likely a tiny pseudocyst. Indolent cystic neoplasm could look similar. Per consensus criteria, this warrants followup with pre and post contrast abdominal MRI at 1 year. This recommendation follows ACR consensus guidelines: Managing Incidental Findings on Abdominal CT: White Paper of the ACR Incidental Findings Committee. [place] [phone] Electronically Signed   By: Abigail Miyamoto M.D.   On: 03/20/2015 08:56    Microbiology: No results found for this or any previous visit (from the past 240 hour(s)).   Labs: Basic Metabolic Panel:  Recent Labs Lab 03/19/15 1232 03/21/15 0400 03/22/15 0403  NA 134* 139 138  K 4.1 3.9 3.7  CL 96* 109 108  CO2 27 25 25   GLUCOSE 165* 94 93  BUN 13 8 8   CREATININE 1.18 1.11 1.13  CALCIUM 10.3 8.5* 8.1*   Liver Function Tests:  Recent Labs Lab 03/19/15 1232 03/21/15 0400 03/22/15 0403  AST 23 12* 16  ALT 17 11* 15*  ALKPHOS 59 39 40  BILITOT 0.9 0.6 0.9  PROT 7.6 5.4* 5.6*  ALBUMIN 4.5 3.3* 3.3*    Recent Labs Lab 03/19/15 1232 03/21/15 0400 03/22/15 0403  LIPASE 30 21 22    No results for input(s): AMMONIA in the last 168 hours. CBC:  Recent Labs Lab 03/19/15 1232 03/21/15 1220  WBC 7.4 5.0  HGB 16.7 14.0  HCT 47.1 41.5  MCV 86.1 89.4  PLT 159 151    Cardiac Enzymes: No results for input(s): CKTOTAL, CKMB, CKMBINDEX, TROPONINI in the last 168 hours. BNP: BNP (last 3 results) No results for input(s): BNP in the last 8760 hours.  ProBNP (last 3 results) No results for input(s): PROBNP in the last 8760 hours.  CBG: No results for input(s): GLUCAP in the last 168 hours.     SignedDomenic Polite  [facility] 03/24/2015, 1:57 PM

## 2015-03-24 NOTE — Telephone Encounter (Signed)
Pt was discharge from [facility] on wed and needs post hospital follow up 1 wk. Can I use sda slots?

## 2015-03-24 NOTE — Telephone Encounter (Signed)
See below

## 2015-03-28 ENCOUNTER — Encounter: Payer: Medicare Other | Admitting: Family Medicine

## 2015-03-28 ENCOUNTER — Ambulatory Visit (INDEPENDENT_AMBULATORY_CARE_PROVIDER_SITE_OTHER): Payer: Medicare Other | Admitting: Family Medicine

## 2015-03-28 ENCOUNTER — Encounter: Payer: Self-pay | Admitting: Family Medicine

## 2015-03-28 VITALS — BP 138/64 | HR 61 | Temp 98.2°F | Wt 148.0 lb

## 2015-03-28 DIAGNOSIS — Z86010 Personal history of colonic polyps: Secondary | ICD-10-CM

## 2015-03-28 DIAGNOSIS — I10 Essential (primary) hypertension: Secondary | ICD-10-CM | POA: Diagnosis not present

## 2015-03-28 DIAGNOSIS — E785 Hyperlipidemia, unspecified: Secondary | ICD-10-CM

## 2015-03-28 DIAGNOSIS — K862 Cyst of pancreas: Secondary | ICD-10-CM

## 2015-03-28 DIAGNOSIS — R140 Abdominal distension (gaseous): Secondary | ICD-10-CM

## 2015-03-28 DIAGNOSIS — R1013 Epigastric pain: Secondary | ICD-10-CM

## 2015-03-28 MED ORDER — ATORVASTATIN CALCIUM 20 MG PO TABS
20.0000 mg | ORAL_TABLET | Freq: Every day | ORAL | Status: DC
Start: 2015-03-28 — End: 2015-09-09

## 2015-03-28 NOTE — Assessment & Plan Note (Signed)
S: controlled On repeat with BP 138/64 after initial 150/64. Had prior elevated 6 days ago and sometimes at home up to 150.   BP Readings from Last 3 Encounters:  03/28/15 138/64  03/22/15 169/54  03/13/15 110/62  A/P:Continue current meds:  Amlodipine 2.5mg  and atenolol. We discussed getting 1 more data point with cardiology next week and if high - consider increasing amlodipine.

## 2015-03-28 NOTE — Progress Notes (Signed)
Garret Reddish, MD  Subjective:  Corey Fischer is a 81 y.o. year old very pleasant male patient who presents for/with See problem oriented charting ROS- No chest pain or shortness of breath. No headache or blurry vision. No current abdominal pain, nausea, vomiting, constipation  Past Medical History-  Patient Active Problem List   Diagnosis Date Noted  . Pancreatic cyst 03/28/2015    Priority: High  . Carotid artery stenosis 11/05/2007    Priority: High  . Hyperglycemia 07/06/2014    Priority: Medium  . Symptomatic PVCs     Priority: Medium  . Bradycardia 12/10/2012    Priority: Medium  . PAC (premature atrial contraction), symptomatic 10/16/2012    Priority: Medium  . BPH (benign prostatic hyperplasia) 07/22/2011    Priority: Medium  . THROMBOCYTHEMIA 04/21/2008    Priority: Medium  . Essential hypertension 04/29/2007    Priority: Medium  . Hyperlipidemia 01/22/2007    Priority: Medium  . Anxiety state, unspecified 12/30/2013    Priority: Low  . Osteoarthritis 03/16/2010    Priority: Low  . Osteopenia 07/06/2009    Priority: Low  . Raynaud's syndrome 06/02/2009    Priority: Low  . Prostatitis 03/01/2009    Priority: Low  . CONSTIPATION, CHRONIC 07/28/2008    Priority: Low  . ESOPHAGEAL STRICTURE 03/04/2008    Priority: Low  . COLONIC POLYPS, HX OF 03/04/2008    Priority: Low  . Unspecified visual loss 10/30/2007    Priority: Low  . Vitamin B12 deficiency 01/26/2007    Priority: Low  . GERD 01/22/2007    Priority: Low  . Schatzki's ring     Medications- reviewed and updated Current Outpatient Prescriptions  Medication Sig Dispense Refill  . amLODipine (NORVASC) 2.5 MG tablet TAKE 1 TABLET BY MOUTH AT BEDTIME 90 tablet 2  . Ascorbic Acid (VITAMIN C) 1000 MG tablet Take 1,000 mg by mouth daily.     [name] aspirin 81 MG tablet Take 81 mg by mouth daily.    [name] atenolol (TENORMIN) 25 MG tablet Take 0.5 tablets (12.5 mg total) by mouth daily. 30 tablet 6  . cetirizine  (ZYRTEC) 10 MG tablet Take 10 mg by mouth daily with breakfast.     . Cholecalciferol (VITAMIN D3) 1000 UNITS CAPS Take 2,000 Units by mouth daily.     . Coenzyme Q10 50 MG CAPS Take 250 mg by mouth daily.     . Cyanocobalamin 2500 MCG SUBL Place 2,500 mcg under the tongue daily.     . hydrocortisone-pramoxine (ANALPRAM-HC) 2.5-1 % rectal cream Place 1 application rectally 3 (three) times daily.    . Magnesium 200 MG TABS Take 200 mg by mouth daily.     . pantoprazole (PROTONIX) 40 MG tablet Take 1 tablet (40 mg total) by mouth daily. 30 tablet 0  . polyethylene glycol (MIRALAX / GLYCOLAX) packet Take 17 g by mouth daily.     . promethazine (PHENERGAN) 25 MG suppository Place 25 mg rectally every 6 (six) hours as needed for nausea or vomiting.    . rifaximin (XIFAXAN) 550 MG TABS tablet Take 1 tablet (550 mg total) by mouth 3 (three) times daily. For 14days 60 tablet 0  . simvastatin (ZOCOR) 20 MG tablet Take 20 mg by mouth every morning.    . Zinc 25 MG TABS Take 25 mg by mouth daily.     [name] atorvastatin (LIPITOR) 20 MG tablet Take 1 tablet (20 mg total) by mouth daily. 30 tablet 5  . diazepam (VALIUM)  5 MG tablet Take 2.5-5 mg by mouth at bedtime as needed for anxiety or sedation.    . hyoscyamine (LEVSIN) 0.125 MG/5ML ELIX Take 10 mLs (0.25 mg total) by mouth every 6 (six) hours. As needed for abdominal cramps (Patient not taking: Reported on 03/28/2015) 450 mL 0   No current facility-administered medications for this visit.    Objective: BP 138/64 mmHg  Pulse 61  Temp(Src) 98.2 F (36.8 C)  Wt 148 lb (67.132 kg) Gen: NAD, resting comfortably CV: RRR no murmurs rubs or gallops Lungs: CTAB no crackles, wheeze, rhonchi Abdomen: soft/nontender/nondistended/normal bowel sounds. No rebound or guarding.  Ext: no edema Skin: warm, dry Neuro: grossly normal, moves all extremities  Assessment/Plan:  Abdominal pain, nausea, bloating S: etiology of abdominal pain was unclear in  hospitalization. He had intermittent episodes since September. Did have 1 case of pancreatitis for which simvastatin was stopped. CT scan showed abdominal and pelvic/lower extremity atherosclerosis so there was some concern for chronic mesenteric ischemia although no significant weight loss. Had EGD in hospital showing Schatzki ring which was dilated. Also had colonoscopy and had sessile polyp removed. He does feel liek his food is going down easier. He was started on 14 day course of rifaximin and during this time his pain has essentially resolved- started for concern of bacterial overgrowth as cause. He has GI follow up iwthin a month. Also had MRCP which showed small psudocyst on pancreatic head with plan for repeat MRI in 1 year.  A/P: IMproved on rifaximin - possible bacterial overgrowth as cause. He is to follow up with GI. Appetite and eating are ok- doubt mesenteric ischemia but will place back on statin and seek to have LDL <100. Patient is also on PPI protonix given Schatzki ring and possible GERD component.   Essential hypertension S: controlled On repeat with BP 138/64 after initial 150/64. Had prior elevated 6 days ago and sometimes at home up to 150.   BP Readings from Last 3 Encounters:  03/28/15 138/64  03/22/15 169/54  03/13/15 110/62  A/P:Continue current meds:  Amlodipine 2.5mg  and atenolol. We discussed getting 1 more data point with cardiology next week and if high - consider increasing amlodipine.    Hyperlipidemia S: suspect poorly controlled as now off simvastatin. - class I pancreatitis (stopped) Lab Results  Component Value Date   CHOL 178 07/06/2014   HDL 42.80 07/06/2014   LDLCALC 97 07/06/2014   LDLDIRECT 84.9 03/01/2009   TRIG 192.0* 07/06/2014   CHOLHDL 4 07/06/2014  A/P:Given atherosclerosis noted on CT, known in carotids and concern mesenteric ischemia- vital to restart statin. We will trial atorvastatin 20mg  which is class III for pancreatitis, could consider  crestor which is class IV if needed. Sees Dr. Gwenlyn Found soon. i will repeat lipids at regularly scheduled follow up   Pancreatic cyst Repeat MRI abdomen with pre and post contrast 03/2015  has cards and GI follow up- offered to see patient back as needed for recurrenc eof symptoms other than regularly scheduled follow up  Meds ordered this encounter  Medications  . atorvastatin (LIPITOR) 20 MG tablet    Sig: Take 1 tablet (20 mg total) by mouth daily.    Dispense:  30 tablet    Refill:  5

## 2015-03-28 NOTE — Assessment & Plan Note (Signed)
S: suspect poorly controlled as now off simvastatin. - class I pancreatitis (stopped) Lab Results  Component Value Date   CHOL 178 07/06/2014   HDL 42.80 07/06/2014   LDLCALC 97 07/06/2014   LDLDIRECT 84.9 03/01/2009   TRIG 192.0* 07/06/2014   CHOLHDL 4 07/06/2014  A/P:Given atherosclerosis noted on CT, known in carotids and concern mesenteric ischemia- vital to restart statin. We will trial atorvastatin 20mg  which is class III for pancreatitis, could consider crestor which is class IV if needed. Sees Dr. Gwenlyn Found soon. i will repeat lipids at regularly scheduled follow up

## 2015-03-28 NOTE — Assessment & Plan Note (Signed)
Repeat MRI abdomen with pre and post contrast 03/2015

## 2015-03-28 NOTE — Patient Instructions (Addendum)
BP better on recheck but some highs at home and high last check BP Readings from Last 3 Encounters:  03/28/15 138/64  03/22/15 169/54  03/13/15 110/62  Let's get one more data point at Dr. Kennon Holter. If >140/90 I am happy to help adjust or Dr. Gwenlyn Found can adjust meds  Prostate remains enlarged on exam. With your new symptoms- might be reasonable to see Dr. Risa Grill. Considered antibiotic course but do not want to mess up abdominal progression  Start atorvastatin- lower risk of pancreatitis than simvastatin but still possible  Continue meds from GI and GI follow up as well as cardiology follow up

## 2015-04-03 SURGERY — ESOPHAGOGASTRODUODENOSCOPY (EGD) WITH PROPOFOL
Anesthesia: Monitor Anesthesia Care

## 2015-04-04 ENCOUNTER — Ambulatory Visit: Payer: Medicare Other | Admitting: Cardiovascular Disease

## 2015-04-07 ENCOUNTER — Encounter: Payer: Self-pay | Admitting: Physician Assistant

## 2015-04-07 ENCOUNTER — Encounter: Payer: Self-pay | Admitting: Cardiovascular Disease

## 2015-04-07 ENCOUNTER — Ambulatory Visit (INDEPENDENT_AMBULATORY_CARE_PROVIDER_SITE_OTHER): Payer: Medicare Other | Admitting: Cardiovascular Disease

## 2015-04-07 VITALS — BP 126/52 | HR 48 | Ht 67.0 in | Wt 146.0 lb

## 2015-04-07 DIAGNOSIS — I10 Essential (primary) hypertension: Secondary | ICD-10-CM

## 2015-04-07 DIAGNOSIS — E785 Hyperlipidemia, unspecified: Secondary | ICD-10-CM

## 2015-04-07 DIAGNOSIS — I6523 Occlusion and stenosis of bilateral carotid arteries: Secondary | ICD-10-CM

## 2015-04-07 DIAGNOSIS — I6529 Occlusion and stenosis of unspecified carotid artery: Secondary | ICD-10-CM | POA: Diagnosis not present

## 2015-04-07 NOTE — Progress Notes (Signed)
04/07/2015 Corey Fischer   [birth year]  [id number]  Primary Physician Garret Reddish, MD Primary Cardiologist: [name] MD [name]   HPI:  The patient is a delightful 81 year old thin and fit appearing married Caucasian male father of 2, grandfather to 4 grandchildren who was self-referred for evaluation of symptomatic PVCs.I last saw him in the office 10/04/14.[name]  He is retired from working at [employer] 7 years ago. His risk factors are remarkable for treated hypertension and hyperlipidemia. He does not smoke and drinks socially. His family history is remarkable for a mother who had atrial fibrillation and a father who had angina, but no one has had documented ischemic heart disease. He has never had an MI or a stroke and denies chest pain or shortness of breath. His surgical history is remarkable for remote TURP, cholecystectomy, and hernia repair.  He has had PVCs since graduate school and was on propranolol remotely, prescribed by Dr. Coralie Keens. Over the last 2 months he has had progressive PVCs which he is more aware of and symptomatic from. He has cut out his caffeine. Lab work performed by Dr. Arnoldo Morale revealed a normal TSH.  He saw [name] registered nurse practitioner back at the end of May 2014 because of palpitations have gotten worse. He was started on Cardizem 30 mg by mouth twice a day and his simvastatin was decreased from 40-20 mg a day. The 2-D echo is essentially normal. He has been checking his blood pressures which are somewhat sporadic but averaging 1:30 to 607 range systolic.  We have stopped his diltiazem which would cause contributing to his Wenkebach and symptoms and changed him to amlodipine. He had no further symptoms. He denies chest pain or shortness of breath. His lipid profile was excellent on statin therapy last measured 05/07/13.  He saw [name] in our [facility] office 03/07/14 because of several days of symptom of  palpitations. His EKG showed PACs. He was initially begun on metoprolol and later switched to low-dose atenolol resulted in improvement in his symptoms.  Since being seen back in 6 months ago he's remained currently stable. He gets occasional episodes of symptomatic PVCs which are self-limited. He denies chest pain or shortness of breath. He does have moderate bilateral internal carotid artery stenosis which we followed by duplex ultrasound. He has been seen in the emergency room several times for abdominal pain of unclear etiology. He did have an abdominal pelvic CTA which parenthetically showed severe aortoiliac atherosclerotic changes although he has 2+ pedal pulses and denies claudication.   Current Outpatient Prescriptions  Medication Sig Dispense Refill  . amLODipine (NORVASC) 2.5 MG tablet TAKE 1 TABLET BY MOUTH AT BEDTIME 90 tablet 2  . Ascorbic Acid (VITAMIN C) 1000 MG tablet Take 1,000 mg by mouth daily.     [name] aspirin 81 MG tablet Take 81 mg by mouth daily.    [name] atenolol (TENORMIN) 25 MG tablet Take 0.5 tablets (12.5 mg total) by mouth daily. 30 tablet 6  . atorvastatin (LIPITOR) 20 MG tablet Take 1 tablet (20 mg total) by mouth daily. 30 tablet 5  . cetirizine (ZYRTEC) 10 MG tablet Take 10 mg by mouth daily with breakfast.     . Cholecalciferol (VITAMIN D3) 1000 UNITS CAPS Take 2,000 Units by mouth daily.     . Coenzyme Q10 50 MG CAPS Take 250 mg by mouth daily.     . Cyanocobalamin 2500 MCG SUBL Place 2,500 mcg under the tongue  daily.     . diazepam (VALIUM) 5 MG tablet Take 2.5-5 mg by mouth at bedtime as needed for anxiety or sedation.    . hydrocortisone-pramoxine (ANALPRAM-HC) 2.5-1 % rectal cream Place 1 application rectally as needed.     . hyoscyamine (LEVSIN) 0.125 MG/5ML ELIX Take 10 mLs (0.25 mg total) by mouth every 6 (six) hours. As needed for abdominal cramps 450 mL 0  . Magnesium 200 MG TABS Take 200 mg by mouth daily.     . pantoprazole (PROTONIX) 40 MG tablet Take 1  tablet (40 mg total) by mouth daily. 30 tablet 0  . polyethylene glycol (MIRALAX / GLYCOLAX) packet Take 17 g by mouth daily.     . promethazine (PHENERGAN) 25 MG suppository Place 25 mg rectally every 6 (six) hours as needed for nausea or vomiting.    . Zinc 25 MG TABS Take 25 mg by mouth daily.      No current facility-administered medications for this visit.    Allergies  Allergen Reactions  . Ciprofloxacin Other (See Comments)    Unknown reaction    Social History   Social History  . Marital Status: Married    Spouse Name: N/A  . Number of Children: 2  . Years of Education: N/A   Occupational History  . retired    Social History Main Topics  . Smoking status: Never Smoker   . Smokeless tobacco: Never Used  . Alcohol Use: Yes     Comment: occasionally  . Drug Use: No  . Sexual Activity: Yes   Other Topics Concern  . Not on file   Social History Narrative   Grew up in [place] town in [place]. 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 Met wife in [place]. At 41, got married and went to college-undergrad in [employer] as well as Ocean[employer], doctorate in Location manager.  Worked at [employer] which eventually became [employer]. Lived in [place] and [place] before eventually moving to [place]. 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       Married over 50 years. 28 years 01/2014. 2 married children with 2 children each. 1 son near [place]. 1 son in [place] with house of rep.       Hobbies: work out most mornings 5 days a week (spin, elipitical), racquetball until 2014 after hip surgery.      Review of Systems: General: negative for chills, fever, night sweats or weight changes.  Cardiovascular: negative for chest pain, dyspnea on exertion, edema, orthopnea, palpitations, paroxysmal nocturnal dyspnea or shortness of breath Dermatological: negative for rash Respiratory: negative for cough or wheezing Urologic: negative for hematuria Abdominal:  negative for nausea, vomiting, diarrhea, bright red blood per rectum, melena, or hematemesis Neurologic: negative for visual changes, syncope, or dizziness All other systems reviewed and are otherwise negative except as noted above.    Blood pressure 126/52, pulse 48, height 5' 7"  (1.702 m), weight 146 lb (66.225 kg).  General appearance: alert and no distress Neck: no adenopathy, no JVD, supple, symmetrical, trachea midline, thyroid not enlarged, symmetric, no tenderness/mass/nodules and right carotid bruit Lungs: clear to auscultation bilaterally Heart: regular rate and rhythm, S1, S2 normal, no murmur, click, rub or gallop and 2+ pedal pulses bilaterally Extremities: extremities normal, atraumatic, no cyanosis or edema  EKG not performed today  ASSESSMENT AND PLAN:   PAC (premature atrial contraction), symptomatic History of PACs/PVCs manifesting as palpitations improved on beta blockade  Hyperlipidemia History of  hyperlipidemia on simvastatin in the past and recently changed to atorvastatin. His most recent lipid profile performed 07/06/14 revealed internal pressure 178, LDL 97 and HDL of 42. We will recheck a lipid and liver profile after the first of the year.  Essential hypertension History of hypertension blood pressure measured at 126/52. He is on atenolol. Continue current meds at current dosing  Carotid artery stenosis History of bilateral carotid artery stenosis by duplex ultrasound most recently checked 12/06/14 revealing 60-70% bilateral ICA stenosis. Neurologically symptomatically. We will follow this by ultrasound on an annual basis.      [name] MD FACP,FACC,FAHA, [facility] 04/07/2015 10:45 AM

## 2015-04-07 NOTE — Assessment & Plan Note (Signed)
History of hyperlipidemia on simvastatin in the past and recently changed to atorvastatin. His most recent lipid profile performed 07/06/14 revealed internal pressure 178, LDL 97 and HDL of 42. We will recheck a lipid and liver profile after the first of the year.

## 2015-04-07 NOTE — Assessment & Plan Note (Signed)
History of PACs/PVCs manifesting as palpitations improved on beta blockade

## 2015-04-07 NOTE — Assessment & Plan Note (Signed)
History of bilateral carotid artery stenosis by duplex ultrasound most recently checked 12/06/14 revealing 60-70% bilateral ICA stenosis. Neurologically symptomatically. We will follow this by ultrasound on an annual basis.

## 2015-04-07 NOTE — Patient Instructions (Addendum)
Medication Instructions:  Your physician recommends that you continue on your current medications as directed. Please refer to the Current Medication list given to you today.   Labwork: Your physician recommends that you return for lab work in: [place] ([place] - [place]. 2017   Testing/Procedures: Your physician has requested that you have a carotid duplex. This test is an ultrasound of the carotid arteries in your neck. It looks at blood flow through these arteries that supply the brain with blood. Allow one hour for this exam. There are no restrictions or special instructions. July 2017   Follow-Up: Your physician wants you to follow-up in: [place]. You will receive a reminder letter in the mail two months in advance. If you don't receive a letter, please call our office to schedule the follow-up appointment.   Any Other Special Instructions Will Be Listed Below (If Applicable).     If you need a refill on your cardiac medications before your next appointment, please call your pharmacy.

## 2015-04-07 NOTE — Assessment & Plan Note (Signed)
History of hypertension blood pressure measured at 126/52. He is on atenolol. Continue current meds at current dosing

## 2015-04-08 ENCOUNTER — Other Ambulatory Visit: Payer: Self-pay | Admitting: Physician Assistant

## 2015-04-10 ENCOUNTER — Other Ambulatory Visit: Payer: Self-pay | Admitting: *Deleted

## 2015-04-10 ENCOUNTER — Other Ambulatory Visit: Payer: Self-pay | Admitting: Physician Assistant

## 2015-04-10 MED ORDER — PANTOPRAZOLE SODIUM 40 MG PO TBEC: 40.0000 mg | DELAYED_RELEASE_TABLET | Freq: Every day | ORAL | Status: DC

## 2015-04-10 NOTE — Telephone Encounter (Signed)
Ok for pantoprazole 40 mg daily  #90 x 1 yr

## 2015-04-19 ENCOUNTER — Other Ambulatory Visit: Payer: Self-pay | Admitting: Cardiovascular Disease

## 2015-04-19 ENCOUNTER — Ambulatory Visit: Payer: Medicare Other | Admitting: Internal Medicine

## 2015-04-19 NOTE — Telephone Encounter (Signed)
°*  STAT* If patient is at the pharmacy, call can be transferred to refill team.   1. Which medications need to be refilled? (please list name of each medication and dose if known) Atenolol 25mg    2. Which pharmacy/location (including street and city if local pharmacy) is medication to be sent to?[facility] on [address]  3. Do they need a 30 day or 90 day supply? [place]

## 2015-04-20 MED ORDER — ATENOLOL 25 MG PO TABS: 12.5000 mg | ORAL_TABLET | Freq: Every day | ORAL | Status: DC

## 2015-04-20 NOTE — Telephone Encounter (Signed)
90-day supply sent to patient's pharmacy electronically

## 2015-04-21 ENCOUNTER — Encounter: Payer: Self-pay | Admitting: Physician Assistant

## 2015-04-21 ENCOUNTER — Ambulatory Visit (INDEPENDENT_AMBULATORY_CARE_PROVIDER_SITE_OTHER): Payer: Medicare Other | Admitting: Physician Assistant

## 2015-04-21 VITALS — BP 124/50 | Ht 66.0 in | Wt 147.6 lb

## 2015-04-21 DIAGNOSIS — I6523 Occlusion and stenosis of bilateral carotid arteries: Secondary | ICD-10-CM | POA: Diagnosis not present

## 2015-04-21 DIAGNOSIS — K219 Gastro-esophageal reflux disease without esophagitis: Secondary | ICD-10-CM

## 2015-04-21 DIAGNOSIS — K589 Irritable bowel syndrome without diarrhea: Secondary | ICD-10-CM

## 2015-04-21 DIAGNOSIS — Z86010 Personal history of colonic polyps: Secondary | ICD-10-CM

## 2015-04-21 NOTE — Patient Instructions (Addendum)
Continue Pantoprazole every AM.  Famotidine as needed.  Continue Miralax.  Try lactaid over the counter.  We have given you lactose free diet information to read and review.  Please call the office to schedule a 3 month follow up. [phone]

## 2015-04-21 NOTE — Progress Notes (Addendum)
Patient ID: Corey Fischer, male   DOB: 1931-06-09, 81 y.o.   MRN: [id number]     History of Present Illness: Corey Fischer is a delightful 81 year old male who is here for hospital follow-up. He was hospitalized 03/19/2015 through 03/22/2015. He has a prior history of hypertension, diverticulosis, ITP, anxiety, depression, carotid artery stenosis, GERD complicated by peptic stricture and chronic recurrent abdominal pain without specific cause. Through the years he has been seen with complaints of intermittent abdominal pain since 2006. He reported upper abdominal pain mostly epigastric that occurs fairly constantly though lately has been waxing and waning. He had been on a low-dose PPI for GERD but had discontinued it several months prior to admission. He reported that for several months prior to admission he was having frequent regurgitation and worsening heartburn. His last colonoscopy had been in April 2010 and revealed an isolated diverticulum, diminutive polyps, and hemorrhoids. He had an upper endoscopy in December 2011 which was normal except for an incidental esophageal stricture and benign pedunculated polyp in the gastric body. At that time he was noted to have a minimally elevated lipase for which a CT scan of the abdomen and pelvis with contrast was performed. This was normal including the pancreas. He was again seen in June 2002 due to complaints of recurrent pain and elevated lipase. He had an endoscopic ultrasound in August 2012 which was normal. He was seen a month later with complaints of pain in his pancreatic enzymes had returned to normal. He had also complained of frequent constipation for which he use Mira lax. He had been seen in the office in October with complaints of upper abdominal pain. He was seen by his primary care physician in October when it was noted that his CBC was normal LFTs were normal lipase was elevated at 361 and amylase was elevated at 302. His simvastatin was discontinued  and he was placed on a liquid diet for several days with resolution of his pain. He was seen on August 24 at which time he was feeling well but was concerned as to what was causing the episodes of pancreatitis at that time he was scheduled for an outpatient MRCP. Several days prior to admission he ate a late breakfast consisting of fried potatoes, onions, peppers, and mushrooms an hour before a meal and developed heartburn, bloating, and abdominal pain. He passed several formed bowel movements and then began to have diarrhea. He called the GI office was and was instructed to come to the emergency room. MRCP showed cholecystectomy without biliary duct dilatation or common duct stone. There was a 6 mm cystic focus in the pancreatic head which was felt to likely be a tiny pseudocyst. Indolent cystic neoplasm could look similarly repair consensus criteria this warranted follow-up with pre-and postcontrast abdominal MRI in 1 year. It was felt during his admission that he was having chronic mesenteric ischemia though he had not had significant weight loss. He had an EGD and Schatzki's ring was found and he had a balloon dilation to 18 mm. He had a colonoscopy and a sessile polyp was found in the descending colon. He was given a trial of Xifaxan 550 mg 3 times a day for 14 days and Levsin as needed and is here today for follow-up. Overall he feels well. His appetite his good he has no early satiety he is moving his bowels regularly. He does note that he has some gas with dairy product.s. He has not had recurrent abdominal pain.  Past Medical History  Diagnosis Date  . Chronic ITP (idiopathic thrombocytopenia) (HCC)   . Hx of adenomatous colonic polyps 2010  . Diverticulosis   . GERD (gastroesophageal reflux disease)   . Allergy   . Hyperlipidemia   . Hypertension   . Elevated PSA   . Anxiety   . Depression   . Stricture and stenosis of esophagus 2010    last endoscopy- 2010  . Gastritis   . Hemorrhoids     . History of hiatal hernia   . Raynaud's syndrome   . Gastric polyp   . Asymptomatic bilateral carotid artery stenosis     a. Last duplex 11/2013 R bulb and ICA - 50-69%, R ECA >50%. L bulb and prox ICA: 0-49%. Repeat due 11/2014.  . Arthritis   . Osteoarthritis hips  . BPH (benign prostatic hyperplasia)   . B12 deficiency   . Palpitations   . Symptomatic PVCs     a. Per report, monitor 05/2012 - NSR with symptomatic PVC. b. Echo 05/2012- EF 55-60%, aortic sclerosis without stenosis. c. Nuc 12/2012 - normal.  . H/O hiatal hernia   . PAC (premature atrial contraction)     a. NSR/SB with 1st degree AVB and PACs/atrial bigeminy seen on event monitor 11/2012.  . Sinus bradycardia   . Wandering (atrial) pacemaker   . INTERNAL HEMORRHOIDS WITHOUT MENTION COMP 08/14/2007    Qualifier: Diagnosis of  By: Arnoldo Morale MD, John E   . Abdominal pain 03/19/2015  . Bilateral carotid artery disease [facility])     Past Surgical History  Procedure Laterality Date  . Rotator cuff repair Right 1987  . Tonsillectomy  age 50  . Appendectomy  age 89  . Laparoscopic inguinal hernia repair Bilateral 09-19-2010  . Left shoulder surg.  2006  . Cholecystectomy  1989  . Carotid duplex Bilateral 05-26-2007  . Transurethral resection of prostate  07/22/2011    Procedure: TRANSURETHRAL RESECTION OF THE PROSTATE WITH GYRUS INSTRUMENTS;  Surgeon: Bernestine Amass, MD;  Location: [facility];  Service: Urology;  Laterality: N/A;  [facility] Gyrus TURP   . Cardiac catheterization  1989    Told that he is "pristine "  . Total hip arthroplasty Left 05/31/2013    Procedure: TOTAL HIP ARTHROPLASTY;  Surgeon: Kerin Salen, MD;  Location: [facility];  Service: Orthopedics;  Laterality: Left;  . Colonoscopy with propofol N/A 03/22/2015    Procedure: COLONOSCOPY WITH PROPOFOL;  Surgeon: Jerene Bears, MD;  Location: [facility];  Service: Endoscopy;  Laterality: N/A;  . Esophagogastroduodenoscopy (egd) with propofol N/A 03/22/2015     Procedure: ESOPHAGOGASTRODUODENOSCOPY (EGD) WITH PROPOFOL;  Surgeon: Jerene Bears, MD;  Location: [facility];  Service: Endoscopy;  Laterality: N/A;   Family History  Problem Relation Age of Onset  . Heart disease Mother   . Heart disease Father   . Colon cancer Neg Hx   . Colon polyps Mother   . Cancer Maternal Grandmother   . Heart failure Maternal Grandfather   . Angina Father    Social History  Substance Use Topics  . Smoking status: Never Smoker   . Smokeless tobacco: Never Used  . Alcohol Use: Yes     Comment: occasionally   Current Outpatient Prescriptions  Medication Sig Dispense Refill  . amLODipine (NORVASC) 2.5 MG tablet TAKE 1 TABLET BY MOUTH AT BEDTIME 90 tablet 2  . Ascorbic Acid (VITAMIN C) 1000 MG tablet Take 1,000 mg by mouth daily.     [name]  aspirin 81 MG tablet Take 81 mg by mouth daily.    [name] atenolol (TENORMIN) 25 MG tablet Take 0.5 tablets (12.5 mg total) by mouth daily. 45 tablet 3  . atorvastatin (LIPITOR) 20 MG tablet Take 1 tablet (20 mg total) by mouth daily. 30 tablet 5  . cetirizine (ZYRTEC) 10 MG tablet Take 10 mg by mouth daily with breakfast.     . Cholecalciferol (VITAMIN D3) 1000 UNITS CAPS Take 2,000 Units by mouth daily.     . Coenzyme Q10 50 MG CAPS Take 250 mg by mouth daily.     . Cyanocobalamin 2500 MCG SUBL Place 2,500 mcg under the tongue daily.     . diazepam (VALIUM) 5 MG tablet Take 2.5-5 mg by mouth at bedtime as needed for anxiety or sedation.    . hydrocortisone-pramoxine (ANALPRAM-HC) 2.5-1 % rectal cream Place 1 application rectally as needed.     . hyoscyamine (LEVSIN) 0.125 MG/5ML ELIX Take 10 mLs (0.25 mg total) by mouth every 6 (six) hours. As needed for abdominal cramps 450 mL 0  . Magnesium 200 MG TABS Take 200 mg by mouth daily.     . pantoprazole (PROTONIX) 40 MG tablet Take 1 tablet (40 mg total) by mouth daily. 90 tablet 4  . polyethylene glycol (MIRALAX / GLYCOLAX) packet Take 17 g by mouth daily.     . promethazine  (PHENERGAN) 25 MG suppository Place 25 mg rectally every 6 (six) hours as needed for nausea or vomiting.    . Zinc 25 MG TABS Take 25 mg by mouth daily.      No current facility-administered medications for this visit.   Allergies  Allergen Reactions  . Ciprofloxacin Other (See Comments)    Unknown reaction     Review of Systems: Gen: Denies any fever, chills, sweats, anorexia, fatigue, weakness, malaise, weight loss, and sleep disorder CV: Denies chest pain, angina, palpitations, syncope, orthopnea, PND, peripheral edema, and claudication. Resp: Denies dyspnea at rest, dyspnea with exercise, cough, sputum, wheezing, coughing up blood, and pleurisy. GI: Denies vomiting blood, jaundice, and fecal incontinence.   Denies dysphagia or odynophagia. GU : Denies urinary burning, blood in urine, urinary frequency, urinary hesitancy, nocturnal urination, and urinary incontinence. MS: Denies joint pain, limitation of movement, and swelling, stiffness, low back pain, extremity pain. Denies muscle weakness, cramps, atrophy.  Derm: Denies rash, itching, dry skin, hives, moles, warts, or unhealing ulcers.  Psych: Denies depression, anxiety, memory loss, suicidal ideation, hallucinations, paranoia, and confusion. Heme: Denies bruising, bleeding, and enlarged lymph nodes. Neuro:  Denies any headaches, dizziness, paresthesia Endo:  Denies any problems with DM, thyroid, adrenal  Studies: Abdomen Pelvis W Contrast   Status: Final result       PACS Images     Show images for CT Abdomen Pelvis W Contrast     Study Result     CLINICAL DATA: 81 year old with acute onset of left-sided abdominal pain several days ago, superimposed upon chronic intermittent epigastric abdominal pain. Surgical history includes cholecystectomy, appendectomy and TURP.  EXAM: CT ABDOMEN AND PELVIS WITH CONTRAST  TECHNIQUE: Multidetector CT imaging of the abdomen and pelvis was performed using the standard  protocol following bolus administration of intravenous contrast.  CONTRAST: 19mL OMNIPAQUE IOHEXOL 300 MG/ML IV. Oral contrast was also administered.  COMPARISON: MRCP earlier same date. CT abdomen and pelvis 04/24/2010.  FINDINGS: Hepatobiliary: Liver normal in size and appearance. Gallbladder surgically absent. No biliary ductal dilation.  Spleen: Normal in size and appearance.  Pancreas: Normal  in appearance. No pancreatic ductal dilation.  Adrenal glands: Normal in appearance.  Genitourinary: Both kidneys normal in size and appearance. No evidence of urinary tract calculi. Minimal perinephric edema, unchanged. Normal-appearing urinary bladder.  Marked prostate gland enlargement, the gland measuring approximately 5.3 x 5.9 x 5.7 cm. Normal-appearing seminal vesicles.  Gastrointestinal: Stomach mildly distended with oral contrast. Diverticulum arising from the posterior wall of the descending duodenum. Small bowel otherwise normal in appearance. Elongated and tortuous sigmoid colon. No visible colonic diverticula. Expected stool burden. Appendix surgically absent.  Ascites: Small amount of bland ascites dependently in the pelvis.  Vascular: Severe aortoiliofemoral atherosclerosis that severe aortoiliac atherosclerosis without aneurysm. Retroaortic left renal vein. Visceral arteries atherosclerotic though patent. Accessory bilateral lower pole renal arteries noted.  Lymphatic: No pathologic lymphadenopathy in the abdomen or pelvis.  Other findings: None.  Musculoskeletal: Prior left hip arthroplasty with anatomic alignment. Degenerative changes involving the facet joints throughout the lumbar spine. Bone island left iliac bone as noted previously.  Visualized lower thorax: Heart mildly enlarged. Severe 3 vessel coronary atherosclerosis. Right coronary artery stent. Linear scarring involving the lower lobes.  IMPRESSION: 1. Small amount of  bland ascites dependently in the pelvis, a nonspecific finding. 2. No acute abnormalities otherwise involving the abdomen or pelvis. 3. Marked prostate gland enlargement. 4. Diverticulum arising from the posterior wall of the descending duodenum.   Electronically Signed  By: Evangeline Dakin M.D.  On: 03/20/2015 20:54   W/Wo Cm/MRCP   Status: Final result       PACS Images     Show images for MR Abd W/Wo Cm/MRCP     Study Result     CLINICAL DATA: Worsening pain since September. Possible common duct stones.  EXAM: MRI ABDOMEN WITHOUT AND WITH CONTRAST (INCLUDING MRCP)  TECHNIQUE: Multiplanar multisequence MR imaging of the abdomen was performed both before and after the administration of intravenous contrast. Heavily T2-weighted images of the biliary and pancreatic ducts were obtained, and three-dimensional MRCP images were rendered by post processing.  CONTRAST: 44mL MULTIHANCE GADOBENATE DIMEGLUMINE 529 MG/ML IV SOLN  COMPARISON: CT of 04/24/2010  FINDINGS: Lower chest: Probable right base scarring. Borderline cardiomegaly, without pleural effusion.  Hepatobiliary: Normal liver. Cholecystectomy. Normal common duct caliber, including 5 mm on image/series 66/4. No evidence of choledocholithiasis.  Pancreas: No pancreatic duct dilatation or mass. A 6 mm pancreatic head cystic focus including on image/series 41/4.  Spleen: Normal in size, without focal abnormality.  Adrenals/Urinary Tract: Normal adrenal glands. Normal right kidney. An upper pole subcentimeter left renal cyst. No hydronephrosis.  Stomach/Bowel: Underdistended stomach. Descending duodenal diverticulum. Otherwise normal large and small bowel.  Vascular/Lymphatic: Aortic atherosclerosis. Retroaortic left renal vein. No retroperitoneal or retrocrural adenopathy.  Other: No ascites.  Musculoskeletal: No acute osseous abnormality.  IMPRESSION: 1. Cholecystectomy  without biliary duct dilatation or common duct stone. 2. No other explanation for abdominal pain. 3. 6 mm cystic focus in the pancreatic head is most likely a tiny pseudocyst. Indolent cystic neoplasm could look similar. Per consensus criteria, this warrants followup with pre and post contrast abdominal MRI at 1 year. This recommendation follows ACR consensus guidelines: Managing Incidental Findings on Abdominal CT: White Paper of the ACR Incidental Findings Committee. [place] [phone]   Electronically Signed  By: Abigail Miyamoto M.D.  On: 03/20/2015 08:56     Physical Exam: BP 124/50 mmHg  Ht 5\' 6"  (1.676 m)  Wt 147 lb 9.6 oz (66.951 kg)  BMI 23.83 kg/m2 General: Pleasant, well developed ,  Caucasian male in no acute distress Head: Normocephalic and atraumatic Eyes:  sclerae anicteric, conjunctiva pink  Ears: Normal auditory acuity Lungs: Clear throughout to auscultation Heart: Regular rate and rhythm Abdomen: Soft, non distended, non-tender. No masses, no hepatomegaly. Normal bowel sounds Musculoskeletal: Symmetrical with no gross deformities  Extremities: No edema  Neurological: Alert oriented x 4, grossly nonfocal Psychological:  Alert and cooperative. Normal mood and affect  Assessment and Recommendations: 81 year old male with a history of intermittent abdominal pain since 2006 status post recent admission with nausea and abdominal pain. MRCP essentially nonrevealing aside from a small pseudocyst. Repeat MRCP will be ordered in 1 year. He was noted to have a small duodenal diverticulum on CT and hence was treated for possible intestinal overgrowth. He seems to have less gas after completion of Xifaxan. He will continue pantoprazole each morning and will use famotidine as needed at night. He will continue Mira lax. He's been encouraged to try using Lactaid since he notes dairy products cause some gas. He will follow up in 3 months with Dr. Hilarie Fredrickson who he would  like to establish care with. He will call before then if he has any problems.  Corey Fischer, Vita Barley PA-C 04/21/2015,  Addendum: Reviewed and agree with ongoing management. Jerene Bears, MD

## 2015-05-04 DIAGNOSIS — M25552 Pain in left hip: Secondary | ICD-10-CM | POA: Diagnosis not present

## 2015-05-08 DIAGNOSIS — M25552 Pain in left hip: Secondary | ICD-10-CM | POA: Diagnosis not present

## 2015-05-11 ENCOUNTER — Other Ambulatory Visit (HOSPITAL_COMMUNITY): Payer: Self-pay | Admitting: Orthopedic Surgery

## 2015-05-11 DIAGNOSIS — Z96642 Presence of left artificial hip joint: Secondary | ICD-10-CM

## 2015-05-11 DIAGNOSIS — M7062 Trochanteric bursitis, left hip: Secondary | ICD-10-CM

## 2015-05-16 ENCOUNTER — Telehealth: Payer: Self-pay | Admitting: Cardiovascular Disease

## 2015-05-16 NOTE — Telephone Encounter (Signed)
Information from Dr. Gwenlyn Found given to patient

## 2015-05-16 NOTE — Telephone Encounter (Signed)
Patient called this morning and reported several sleepless nights and thinks it is because his heart rate is slow and irregular Last night his heart rate was 43-44 and irregular so he took an extra 12.5 mg of atenolol. This morning feels his heart is regular with a rate of 44 with BP 130/70 No dizziness, shortness of breath or pain. Told him he could take a valium but cautioned him not to take an extra atenolol with his heart rate in the 40's Routing to Dr. Gwenlyn Found for further advice

## 2015-05-16 NOTE — Telephone Encounter (Signed)
No new advice

## 2015-05-16 NOTE — Telephone Encounter (Signed)
Please call,been having a lot of arrhythmia,not able yo sleep.His heart rate is  Around 43 or 44. Whether he should stay in regular dose of Atenolol. Also can he he take a half of Valium,since he have not been able to sleep? If not at [facility] call on [phone].

## 2015-05-25 ENCOUNTER — Ambulatory Visit (HOSPITAL_COMMUNITY)
Admission: RE | Admit: 2015-05-25 | Discharge: 2015-05-25 | Disposition: A | Discharge status: Home / Self Care | Payer: Medicare Other | Source: Ambulatory Visit | Attending: Orthopedic Surgery | Admitting: Orthopedic Surgery

## 2015-05-25 DIAGNOSIS — M7062 Trochanteric bursitis, left hip: Secondary | ICD-10-CM

## 2015-05-25 DIAGNOSIS — N400 Enlarged prostate without lower urinary tract symptoms: Secondary | ICD-10-CM | POA: Diagnosis not present

## 2015-05-25 DIAGNOSIS — Z96642 Presence of left artificial hip joint: Secondary | ICD-10-CM | POA: Insufficient documentation

## 2015-05-31 ENCOUNTER — Encounter: Payer: Self-pay | Admitting: Family Medicine

## 2015-05-31 DIAGNOSIS — M25552 Pain in left hip: Secondary | ICD-10-CM | POA: Diagnosis not present

## 2015-06-02 ENCOUNTER — Other Ambulatory Visit: Payer: Self-pay | Admitting: Family Medicine

## 2015-06-02 ENCOUNTER — Other Ambulatory Visit (INDEPENDENT_AMBULATORY_CARE_PROVIDER_SITE_OTHER): Payer: Medicare Other

## 2015-06-02 ENCOUNTER — Telehealth: Payer: Self-pay | Admitting: Family Medicine

## 2015-06-02 DIAGNOSIS — M25552 Pain in left hip: Secondary | ICD-10-CM

## 2015-06-02 DIAGNOSIS — E785 Hyperlipidemia, unspecified: Secondary | ICD-10-CM

## 2015-06-02 DIAGNOSIS — M25559 Pain in unspecified hip: Secondary | ICD-10-CM | POA: Diagnosis not present

## 2015-06-02 LAB — LIPID PANEL
Cholesterol: 115 mg/dL (ref 0–200)
HDL: 37.4 mg/dL — AB (ref >39.00)
LDL Cholesterol: 50 mg/dL (ref 0–99)
NONHDL: 77.53
Total CHOL/HDL Ratio: 3
Triglycerides: 137 mg/dL (ref 0.0–149.0)
VLDL: 27.4 mg/dL (ref 0.0–40.0)

## 2015-06-02 LAB — CBC
HCT: 45.4 % (ref 39.0–52.0)
HEMOGLOBIN: 14.9 g/dL (ref 13.0–17.0)
MCHC: 32.8 g/dL (ref 30.0–36.0)
MCV: 89.4 fl (ref 78.0–100.0)
Platelets: 148 10*3/uL — ABNORMAL LOW (ref 150.0–400.0)
RBC: 5.08 Mil/uL (ref 4.22–5.81)
RDW: 13.6 % (ref 11.5–15.5)
WBC: 5.1 10*3/uL (ref 4.0–10.5)

## 2015-06-02 LAB — RHEUMATOID FACTOR

## 2015-06-02 LAB — HEPATIC FUNCTION PANEL
ALBUMIN: 4.2 g/dL (ref 3.5–5.2)
ALT: 14 U/L (ref 0–53)
AST: 15 U/L (ref 0–37)
Alkaline Phosphatase: 53 U/L (ref 39–117)
Bilirubin, Direct: 0.1 mg/dL (ref 0.0–0.3)
Total Bilirubin: 0.8 mg/dL (ref 0.2–1.2)
Total Protein: 6.7 g/dL (ref 6.0–8.3)

## 2015-06-02 LAB — SEDIMENTATION RATE: SED RATE: 3 mm/h (ref 0–22)

## 2015-06-02 LAB — C-REACTIVE PROTEIN: CRP: 0.1 mg/dL — ABNORMAL LOW (ref 0.5–20.0)

## 2015-06-02 NOTE — Telephone Encounter (Signed)
Ok to schedule.

## 2015-06-02 NOTE — Telephone Encounter (Signed)
Pt scheduled  

## 2015-06-02 NOTE — Telephone Encounter (Signed)
lmom for pt to call back

## 2015-06-02 NOTE — Telephone Encounter (Signed)
This is fine [place].

## 2015-06-02 NOTE — Telephone Encounter (Signed)
Pt has an lab order from dr Malva [facility] for cbc,esr,crp and ?ana. Pt would like to have labs drawn here. Can  I sch? Dr   Gwenlyn Found has order in system  for pt to have blood work also can I sch?

## 2015-06-02 NOTE — Telephone Encounter (Signed)
Yes- there was also prior mychart message on this if you could review it

## 2015-06-05 LAB — ANA: ANA: NEGATIVE

## 2015-06-06 ENCOUNTER — Encounter: Payer: Self-pay | Admitting: Family Medicine

## 2015-07-13 ENCOUNTER — Encounter: Payer: Self-pay | Admitting: Physician Assistant

## 2015-07-25 DIAGNOSIS — D225 Melanocytic nevi of trunk: Secondary | ICD-10-CM | POA: Diagnosis not present

## 2015-07-25 DIAGNOSIS — Z23 Encounter for immunization: Secondary | ICD-10-CM | POA: Diagnosis not present

## 2015-07-25 DIAGNOSIS — L821 Other seborrheic keratosis: Secondary | ICD-10-CM | POA: Diagnosis not present

## 2015-07-25 DIAGNOSIS — D2222 Melanocytic nevi of left ear and external auricular canal: Secondary | ICD-10-CM | POA: Diagnosis not present

## 2015-07-25 DIAGNOSIS — D1801 Hemangioma of skin and subcutaneous tissue: Secondary | ICD-10-CM | POA: Diagnosis not present

## 2015-07-25 DIAGNOSIS — L570 Actinic keratosis: Secondary | ICD-10-CM | POA: Diagnosis not present

## 2015-07-26 DIAGNOSIS — M79605 Pain in left leg: Secondary | ICD-10-CM | POA: Diagnosis not present

## 2015-07-26 DIAGNOSIS — M25552 Pain in left hip: Secondary | ICD-10-CM | POA: Diagnosis not present

## 2015-08-03 DIAGNOSIS — G629 Polyneuropathy, unspecified: Secondary | ICD-10-CM | POA: Diagnosis not present

## 2015-08-15 DIAGNOSIS — M25552 Pain in left hip: Secondary | ICD-10-CM | POA: Diagnosis not present

## 2015-08-21 DIAGNOSIS — H01003 Unspecified blepharitis right eye, unspecified eyelid: Secondary | ICD-10-CM | POA: Diagnosis not present

## 2015-08-21 DIAGNOSIS — H40013 Open angle with borderline findings, low risk, bilateral: Secondary | ICD-10-CM | POA: Diagnosis not present

## 2015-08-21 DIAGNOSIS — H16141 Punctate keratitis, right eye: Secondary | ICD-10-CM | POA: Diagnosis not present

## 2015-08-21 DIAGNOSIS — H04123 Dry eye syndrome of bilateral lacrimal glands: Secondary | ICD-10-CM | POA: Diagnosis not present

## 2015-09-09 ENCOUNTER — Other Ambulatory Visit: Payer: Self-pay | Admitting: Family Medicine

## 2015-09-14 ENCOUNTER — Other Ambulatory Visit: Payer: Self-pay | Admitting: Family Medicine

## 2015-09-14 DIAGNOSIS — H40013 Open angle with borderline findings, low risk, bilateral: Secondary | ICD-10-CM | POA: Diagnosis not present

## 2015-09-14 DIAGNOSIS — H35372 Puckering of macula, left eye: Secondary | ICD-10-CM | POA: Diagnosis not present

## 2015-09-14 DIAGNOSIS — H26491 Other secondary cataract, right eye: Secondary | ICD-10-CM | POA: Diagnosis not present

## 2015-09-14 DIAGNOSIS — H348122 Central retinal vein occlusion, left eye, stable: Secondary | ICD-10-CM | POA: Diagnosis not present

## 2015-09-14 DIAGNOSIS — Z961 Presence of intraocular lens: Secondary | ICD-10-CM | POA: Diagnosis not present

## 2015-09-14 DIAGNOSIS — H35033 Hypertensive retinopathy, bilateral: Secondary | ICD-10-CM | POA: Diagnosis not present

## 2015-10-04 ENCOUNTER — Encounter: Payer: Self-pay | Admitting: Cardiovascular Disease

## 2015-10-04 ENCOUNTER — Ambulatory Visit (INDEPENDENT_AMBULATORY_CARE_PROVIDER_SITE_OTHER): Payer: Medicare Other | Admitting: Cardiovascular Disease

## 2015-10-04 DIAGNOSIS — I10 Essential (primary) hypertension: Secondary | ICD-10-CM

## 2015-10-04 NOTE — Patient Instructions (Addendum)
Medication Instructions:  Your physician has recommended you make the following change in your medication:  1- STOP TAKING YOUR amlodipine.   Labwork: NONE  Testing/Procedures: Your physician has requested that you have a carotid duplex. This test is an ultrasound of the carotid arteries in your neck. It looks at blood flow through these arteries that supply the brain with blood. Allow one hour for this exam. There are no restrictions or special instructions.  SCHEDULE FOR July 2017.    Follow-Up: Your physician wants you to follow-up in: [place]. You will receive a reminder letter in the mail two months in advance. If you don't receive a letter, please call our office to schedule the follow-up appointment.   Any Other Special Instructions Will Be Listed Below (If Applicable).     If you need a refill on your cardiac medications before your next appointment, please call your pharmacy.

## 2015-10-04 NOTE — Assessment & Plan Note (Signed)
History of carotid artery disease with his most recent Doppler performed 12/06/14 revealing moderate bilateral ICA stenosis. He is neurologically asymptomatic. We will repeat the study this coming July.

## 2015-10-04 NOTE — Assessment & Plan Note (Signed)
History of hypertension on amlodipine and atenolol. Blood pressure today is 94/44. Does complain of some orthostatic symptoms. We discussed stopping his amlodipine assessing his blood pressure at home and his symptoms.

## 2015-10-04 NOTE — Assessment & Plan Note (Signed)
History of hyperlipidemia on statin therapy with recent lipid profile performed 1/13/17revealed total cholesterol 1:15, LDL 50 and HDL of 37

## 2015-10-04 NOTE — Progress Notes (Signed)
10/04/2015 Corey STRAUGHTER   02/28/1932  [id number]  Primary Physician Garret Reddish, MD Primary Cardiologist: [name] MD [name]   HPI:  The patient is a delightful 82 year old thin and fit appearing married Caucasian male father of 2, grandfather to 4 grandchildren who was self-referred for evaluation of symptomatic PVCs.I last saw him in the office 04/07/15.  He is retired from working at [employer] 7 years ago. His risk factors are remarkable for treated hypertension and hyperlipidemia. He does not smoke and drinks socially. His family history is remarkable for a mother who had atrial fibrillation and a father who had angina, but no one has had documented ischemic heart disease. He has never had an MI or a stroke and denies chest pain or shortness of breath. His surgical history is remarkable for remote TURP, cholecystectomy, and hernia repair.  He has had PVCs since graduate school and was on propranolol remotely, prescribed by Dr. Coralie Keens. Over the last 2 months he has had progressive PVCs which he is more aware of and symptomatic from. He has cut out his caffeine. Lab work performed by Dr. Arnoldo Morale revealed a normal TSH.  He saw [name] registered nurse practitioner back at the end of May 2014 because of palpitations have gotten worse. He was started on Cardizem 30 mg by mouth twice a day and his simvastatin was decreased from 40-20 mg a day. The 2-D echo is essentially normal. He has been checking his blood pressures which are somewhat sporadic but averaging 1:30 to 102 range systolic.  We have stopped his diltiazem which would cause contributing to his Wenkebach and symptoms and changed him to amlodipine. He had no further symptoms. He denies chest pain or shortness of breath. His lipid profile was excellent on statin therapy last measured 05/07/13.  He saw [name] in our [facility] office 03/07/14 because of several days of symptom of palpitations.  His EKG showed PACs. He was initially begun on metoprolol and later switched to low-dose atenolol resulted in improvement in his symptoms.  Since being seen back in 6 months ago he's remained currently stable. He gets occasional episodes of symptomatic PVCs which are self-limited. He denies chest pain or shortness of breath. He does have moderate bilateral internal carotid artery stenosis which we followed by duplex ultrasound. He has been seen in the emergency room several times for abdominal pain of unclear etiology. He did have an abdominal pelvic CTA which parenthetically showed severe aortoiliac atherosclerotic changes although he has 2+ pedal pulses and denies claudication.  Since I saw him 6 months ago he started some orthostatic symptoms when he does yoga and some more noticeable dyspnea after a particularly strenuous workout. He denies chest pain or claudication.   Current Outpatient Prescriptions  Medication Sig Dispense Refill  . amLODipine (NORVASC) 2.5 MG tablet TAKE 1 TABLET BY MOUTH AT BEDTIME 90 tablet 2  . Ascorbic Acid (VITAMIN C) 1000 MG tablet Take 1,000 mg by mouth daily.     [name] aspirin 81 MG tablet Take 81 mg by mouth daily.    [name] atenolol (TENORMIN) 25 MG tablet Take 0.5 tablets (12.5 mg total) by mouth daily. 45 tablet 3  . atorvastatin (LIPITOR) 20 MG tablet TAKE 1 TABLET(20 MG) BY MOUTH DAILY 30 tablet 5  . cetirizine (ZYRTEC) 10 MG tablet Take 10 mg by mouth daily with breakfast.     . Coenzyme Q10 50 MG CAPS Take 250 mg by mouth daily.     [name]  Cyanocobalamin 2500 MCG SUBL Place 2,500 mcg under the tongue daily.     . diazepam (VALIUM) 5 MG tablet Take 2.5-5 mg by mouth at bedtime as needed for anxiety or sedation.    . hydrocortisone-pramoxine (ANALPRAM-HC) 2.5-1 % rectal cream Place 1 application rectally as needed.     . Magnesium 200 MG TABS Take 200 mg by mouth daily.     . pantoprazole (PROTONIX) 40 MG tablet Take 1 tablet (40 mg total) by mouth daily. 90 tablet 4  .  polyethylene glycol (MIRALAX / GLYCOLAX) packet Take 17 g by mouth daily.     . promethazine (PHENERGAN) 25 MG suppository Place 25 mg rectally every 6 (six) hours as needed for nausea or vomiting.    . Zinc 25 MG TABS Take 25 mg by mouth daily.     . Cholecalciferol (VITAMIN D3) 1000 UNITS CAPS Take 2,000 Units by mouth daily.      No current facility-administered medications for this visit.    Allergies  Allergen Reactions  . Ciprofloxacin Other (See Comments)    Unknown reaction    Social History   Social History  . Marital Status: Married    Spouse Name: N/A  . Number of Children: 2  . Years of Education: N/A   Occupational History  . retired    Social History Main Topics  . Smoking status: Never Smoker   . Smokeless tobacco: Never Used  . Alcohol Use: Yes     Comment: occasionally  . Drug Use: No  . Sexual Activity: Yes   Other Topics Concern  . Not on file   Social History Narrative   Grew up in [place] town in [place]. 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 Met wife in [place]. At 48, got married and went to college-undergrad in [employer] as well as Scientist, water quality, doctorate in Architect.  Worked at [employer] which eventually became [employer]. Lived in [facility] [place] and [place] before eventually moving to [place]. 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       Married over 50 years. 54 years 01/2014. 2 married children with 2 children each. 1 son near [place]. 1 son in [place] with house of rep.       Hobbies: work out most mornings 5 days a week (spin, elipitical), racquetball until 2014 after hip surgery.      Review of Systems: General: negative for chills, fever, night sweats or weight changes.  Cardiovascular: negative for chest pain, dyspnea on exertion, edema, orthopnea, palpitations, paroxysmal nocturnal dyspnea or shortness of breath Dermatological: negative for rash Respiratory: negative for cough or wheezing Urologic: negative  for hematuria Abdominal: negative for nausea, vomiting, diarrhea, bright red blood per rectum, melena, or hematemesis Neurologic: negative for visual changes, syncope, or dizziness All other systems reviewed and are otherwise negative except as noted above.    Blood pressure 94/44, pulse 52, height 5\' 7"  (1.702 m), weight 152 lb 8 oz (69.174 kg).  General appearance: alert and no distress Neck: no adenopathy, no JVD, supple, symmetrical, trachea midline, thyroid not enlarged, symmetric, no tenderness/mass/nodules and right carotid bruit Lungs: clear to auscultation bilaterally Heart: regular rate and rhythm, S1, S2 normal, no murmur, click, rub or gallop Extremities: extremities normal, atraumatic, no cyanosis or edema  EKG sinus bradycardia 53 for ST or T-wave changes. I personally reviewed this EKG  ASSESSMENT AND PLAN:   Hyperlipidemia History of hyperlipidemia on statin therapy with recent lipid profile performed  1/13/17revealed total cholesterol 1:15, LDL 50 and HDL of 37  Essential hypertension History of hypertension on amlodipine and atenolol. Blood pressure today is 94/44. Does complain of some orthostatic symptoms. We discussed stopping his amlodipine assessing his blood pressure at home and his symptoms.  Carotid artery stenosis History of carotid artery disease with his most recent Doppler performed 12/06/14 revealing moderate bilateral ICA stenosis. He is neurologically asymptomatic. We will repeat the study this coming July.      [name] MD FACP,FACC,FAHA, [facility] 10/04/2015 12:02 PM

## 2015-10-09 DIAGNOSIS — L570 Actinic keratosis: Secondary | ICD-10-CM | POA: Diagnosis not present

## 2015-10-17 ENCOUNTER — Telehealth: Payer: Self-pay | Admitting: Internal Medicine

## 2015-10-17 NOTE — Telephone Encounter (Signed)
Pt was seen Oct 2016 and admitted to the hospital for abd pain and bloating as well as nausea, Began having the same lower abd pain on Saturday morning, very bloated.  He is passing gas, last BM was this morning and was normal per pt.  He takes miralax daily.  No other symptoms, he states other than the bloating he feels ok.  Was told to call if he started have symptoms again.  Follow up with Henrene Pastor is not until 11/2015.  Appt with Jesscia for tomorrow 10/18/15 130 pm.

## 2015-10-18 ENCOUNTER — Other Ambulatory Visit (INDEPENDENT_AMBULATORY_CARE_PROVIDER_SITE_OTHER): Payer: Medicare Other

## 2015-10-18 ENCOUNTER — Encounter: Payer: Self-pay | Admitting: Gastroenterology

## 2015-10-18 ENCOUNTER — Ambulatory Visit (INDEPENDENT_AMBULATORY_CARE_PROVIDER_SITE_OTHER): Payer: Medicare Other | Admitting: Gastroenterology

## 2015-10-18 ENCOUNTER — Telehealth: Payer: Self-pay | Admitting: Gastroenterology

## 2015-10-18 VITALS — BP 156/56 | HR 52 | Ht 66.75 in | Wt 157.5 lb

## 2015-10-18 DIAGNOSIS — R1084 Generalized abdominal pain: Secondary | ICD-10-CM

## 2015-10-18 DIAGNOSIS — R140 Abdominal distension (gaseous): Secondary | ICD-10-CM | POA: Diagnosis not present

## 2015-10-18 LAB — CBC WITH DIFFERENTIAL/PLATELET
BASOS ABS: 0 10*3/uL (ref 0.0–0.1)
Basophils Relative: 0.5 % (ref 0.0–3.0)
EOS PCT: 3.3 % (ref 0.0–5.0)
Eosinophils Absolute: 0.2 10*3/uL (ref 0.0–0.7)
HEMATOCRIT: 45.1 % (ref 39.0–52.0)
HEMOGLOBIN: 15.2 g/dL (ref 13.0–17.0)
LYMPHS PCT: 27.9 % (ref 12.0–46.0)
Lymphs Abs: 1.9 10*3/uL (ref 0.7–4.0)
MCHC: 33.7 g/dL (ref 30.0–36.0)
MCV: 87.6 fl (ref 78.0–100.0)
MONOS PCT: 7.9 % (ref 3.0–12.0)
Monocytes Absolute: 0.5 10*3/uL (ref 0.1–1.0)
Neutro Abs: 4 10*3/uL (ref 1.4–7.7)
Neutrophils Relative %: 60.4 % (ref 43.0–77.0)
Platelets: 141 10*3/uL — ABNORMAL LOW (ref 150.0–400.0)
RBC: 5.15 Mil/uL (ref 4.22–5.81)
RDW: 13.7 % (ref 11.5–15.5)
WBC: 6.7 10*3/uL (ref 4.0–10.5)

## 2015-10-18 LAB — COMPREHENSIVE METABOLIC PANEL
ALBUMIN: 4.4 g/dL (ref 3.5–5.2)
ALK PHOS: 52 U/L (ref 39–117)
ALT: 16 U/L (ref 0–53)
AST: 15 U/L (ref 0–37)
BILIRUBIN TOTAL: 0.8 mg/dL (ref 0.2–1.2)
BUN: 16 mg/dL (ref 6–23)
CALCIUM: 9.7 mg/dL (ref 8.4–10.5)
CO2: 30 mEq/L (ref 19–32)
Chloride: 104 mEq/L (ref 96–112)
Creatinine, Ser: 1.05 mg/dL (ref 0.40–1.50)
GFR: 71.92 mL/min (ref >60.00)
GLUCOSE: 91 mg/dL (ref 70–99)
POTASSIUM: 5 meq/L (ref 3.5–5.1)
Sodium: 139 mEq/L (ref 135–145)
TOTAL PROTEIN: 6.8 g/dL (ref 6.0–8.3)

## 2015-10-18 LAB — AMYLASE: AMYLASE: 40 U/L (ref 27–131)

## 2015-10-18 LAB — LIPASE: LIPASE: 11 U/L (ref 11.0–59.0)

## 2015-10-18 MED ORDER — RIFAXIMIN 550 MG PO TABS: 550.0000 mg | ORAL_TABLET | Freq: Three times a day (TID) | ORAL | Status: DC

## 2015-10-18 NOTE — Progress Notes (Addendum)
     10/18/2015 Corey Fischer [id number] 1931/11/01   History of Present Illness:  This is a pleasant 82 year old male who has previous history with Dr. Henrene Fischer, but apparently his care has been transferred to Dr. Hilarie Fischer.  Please see note from 04/21/2015 with Corey Rubin Hvozdovic, PA-C, for details regarding patient's symptoms and evaluation.  Has a quick review, patient has been experiencing intermittent abdominal pain since 2006, but apparently more frequent episodes of the past year. He was hospitalized at the end of October/beginning of November 2016 and underwent extensive evaluation from a GI standpoint (even further testing dating back to 2012 including EUS). He was found to have a 6 mm cystic focus on the pancreatic head consistent with pseudocyst and recommend repeat MRI in one year. He was treated empirically with a 14 day course of Xifaxan for small intestinal bacterial overgrowth.  He presents for office today with recurrent complaints of abdominal pain, which he initially thought was related to his pancreas. His pain, however, is below his umbilicus. He describes it as a sensation that gas is moving around. Bothers him most in the morning. Is unaffected by eating. Does not keep him from sleep at night.  Had a quick wave of nausea this morning, but otherwise no other associated symptoms except for bloating and gassiness. He says that from December until just 4 or 5 days ago when this began he was feeling fine.  Current Medications, Allergies, Past Medical History, Past Surgical History, Family History and Social History were reviewed in [facility] record.   Physical Exam: BP 156/56 mmHg  Pulse 52  Ht 5' 6.75" (1.695 m)  Wt 157 lb 8 oz (71.442 kg)  BMI 24.87 kg/m2 General: Well developed white male in no acute distress Head: Normocephalic and atraumatic Eyes:  Sclerae anicteric, conjunctiva pink  Ears: Normal auditory acuity Lungs: Clear throughout to  auscultation Heart: Regular rate and rhythm Abdomen: Soft, non-distended.  Normal bowel sounds.  Diffuse TTP.  Previous appendectomy and cholecystectomy scars noted. Musculoskeletal: Symmetrical with no gross deformities  Extremities: No edema  Neurological: Alert oriented x 4, grossly non-focal Psychological:  Alert and cooperative. Normal mood and affect  Assessment and Recommendations: -82year-old male whose experienced intermittent abdominal pain since 2006, but with more frequent occurrences over the past year. He's had extensive evaluation recently and only finding was a pancreatic pseudocyst. Area of his pain does not correlate with pancreatic location currently. He also complains of bloating and gassiness. Was treated previously with Xifaxan for IBS/SIBO with some questionable success. Question if he possibly has some adhesions/scar tissue that are contributing to his intermittent abdominal pain.  We will check labs today including a CBC, CMP, amylase and lipase. I think reimaging will have low yield. He is willing to repeat a course of Xifaxan 550 mg 3 times a day for 14 days.  We will schedule him for follow-up with Dr. Hilarie Fischer in about 6 weeks but obviously he will call back sooner if pain worsens or he develops other alarming symptoms.  Addendum: Reviewed and agree with initial management. Corey Bears, MD

## 2015-10-18 NOTE — Patient Instructions (Signed)
Please go to the basement level to have your labs drawn.  We sent a prescription for Xifaxan 550 mg to [facility].  They will run your insurance and call you regarding delivering the medication.  We made you an appointment with Dr. Hilarie Fredrickson for 12-18-2015 at 1:30 PM.

## 2015-10-19 ENCOUNTER — Telehealth: Payer: Self-pay | Admitting: Gastroenterology

## 2015-10-19 ENCOUNTER — Other Ambulatory Visit: Payer: Self-pay | Admitting: *Deleted

## 2015-10-19 MED ORDER — ONDANSETRON 4 MG PO TBDP: ORAL_TABLET | ORAL | Status: DC

## 2015-10-19 NOTE — Telephone Encounter (Signed)
Faxed the insurance cards and office note to [facility] today, 10-19-2015.

## 2015-10-19 NOTE — Telephone Encounter (Signed)
Faxed a second time to [facility] , attention Neta:  [id number].

## 2015-10-19 NOTE — Telephone Encounter (Signed)
I sent a prescription per Alonza Bogus PA-C for the Zofran ODT 4 mg to [facility].  Called and advised patient's wife.

## 2015-10-23 ENCOUNTER — Telehealth: Payer: Self-pay | Admitting: Gastroenterology

## 2015-10-23 NOTE — Telephone Encounter (Signed)
LM for the patient advising I will speak to Corey Bogus PA tomorrow 10-24-2015.  I advised she is not in today.  I will let her know the copay for the Xifaxan 550 mg is $500.  I will ask if she has anything he can take that is not so expensive.  Also he is having some abdominal pain.

## 2015-10-24 NOTE — Telephone Encounter (Signed)
The patient said his abdominal pain is not as bad as it was last week. He is eating 3 meals a day.  He said his co-pay is $ 480.00 for Xifaxan . I told him I will talk to [place]. She said he could take Flagyl but it is not as specific for the gut.  She wanted to know what he decided.  I called the patient back and he said he is calling [facility] to let them know he would like them to mail the Xifaxan 550 mg to him.  He said he will call if he has any trouble while or after taking the Xifaxan 550 mg.

## 2015-10-24 NOTE — Telephone Encounter (Signed)
Patient is calling back regarding this. [facility] # [phone]

## 2015-10-25 NOTE — Telephone Encounter (Signed)
[facility] called to state Corey Fischer has been approved and patient will receive on Monday

## 2015-11-07 ENCOUNTER — Telehealth: Payer: Self-pay | Admitting: Internal Medicine

## 2015-11-08 NOTE — Telephone Encounter (Signed)
Patient has not been given Analpram by Dr. Henrene Pastor since 2013 and has not been discussed since.  If he is having problems he probably needs to schedule an office visit

## 2015-11-22 ENCOUNTER — Telehealth: Payer: Self-pay | Admitting: Gastroenterology

## 2015-11-22 NOTE — Telephone Encounter (Signed)
I called [employer], asked to speak to Shawnie Pons but Adonis Huguenin was not able to transfer me. It was noted they needed diagnosis codes.  She said they do not need diagnosis codes.  This was regarding riembursment to the patient and this was approved. They do have the diagnosis codes.

## 2015-12-04 ENCOUNTER — Ambulatory Visit (HOSPITAL_COMMUNITY)
Admission: RE | Admit: 2015-12-04 | Discharge: 2015-12-04 | Disposition: A | Discharge status: Home / Self Care | Payer: Medicare Other | Source: Ambulatory Visit | Attending: Cardiology | Admitting: Cardiology

## 2015-12-04 DIAGNOSIS — I6529 Occlusion and stenosis of unspecified carotid artery: Secondary | ICD-10-CM | POA: Diagnosis present

## 2015-12-04 DIAGNOSIS — E785 Hyperlipidemia, unspecified: Secondary | ICD-10-CM | POA: Diagnosis not present

## 2015-12-04 DIAGNOSIS — F329 Major depressive disorder, single episode, unspecified: Secondary | ICD-10-CM | POA: Insufficient documentation

## 2015-12-04 DIAGNOSIS — K219 Gastro-esophageal reflux disease without esophagitis: Secondary | ICD-10-CM | POA: Diagnosis not present

## 2015-12-04 DIAGNOSIS — F419 Anxiety disorder, unspecified: Secondary | ICD-10-CM | POA: Diagnosis not present

## 2015-12-04 DIAGNOSIS — I10 Essential (primary) hypertension: Secondary | ICD-10-CM | POA: Diagnosis not present

## 2015-12-04 DIAGNOSIS — I6523 Occlusion and stenosis of bilateral carotid arteries: Secondary | ICD-10-CM | POA: Insufficient documentation

## 2015-12-05 ENCOUNTER — Telehealth: Payer: Self-pay | Admitting: *Deleted

## 2015-12-05 DIAGNOSIS — I6523 Occlusion and stenosis of bilateral carotid arteries: Secondary | ICD-10-CM

## 2015-12-05 NOTE — Telephone Encounter (Signed)
Notes Recorded by Vanessa Ralphs, RN on 12/05/2015 at 3:37 PM Results released to My Chart. Order entered into EPIC.

## 2015-12-05 NOTE — Telephone Encounter (Signed)
-----   Message from [name], MD sent at 12/04/2015  4:05 PM EDT ----- No change from prior study. Repeat in 12 months.

## 2015-12-18 ENCOUNTER — Ambulatory Visit (INDEPENDENT_AMBULATORY_CARE_PROVIDER_SITE_OTHER): Payer: Medicare Other | Admitting: Internal Medicine

## 2015-12-18 ENCOUNTER — Encounter: Payer: Self-pay | Admitting: Internal Medicine

## 2015-12-18 VITALS — BP 120/50 | HR 56 | Ht 66.75 in | Wt 154.1 lb

## 2015-12-18 DIAGNOSIS — K219 Gastro-esophageal reflux disease without esophagitis: Secondary | ICD-10-CM

## 2015-12-18 DIAGNOSIS — I6529 Occlusion and stenosis of unspecified carotid artery: Secondary | ICD-10-CM | POA: Diagnosis not present

## 2015-12-18 DIAGNOSIS — K589 Irritable bowel syndrome without diarrhea: Secondary | ICD-10-CM | POA: Diagnosis not present

## 2015-12-18 DIAGNOSIS — K5900 Constipation, unspecified: Secondary | ICD-10-CM | POA: Diagnosis not present

## 2015-12-18 DIAGNOSIS — K862 Cyst of pancreas: Secondary | ICD-10-CM | POA: Diagnosis not present

## 2015-12-18 NOTE — Patient Instructions (Signed)
Please continue protonix 40 mg daily.  Continue Miralax.  Continue famotidine every evening as needed.  You will be due for an MRI (pancreatic protocol) of your pancreas in 02/2016 for follow up of previously seen pancreatic cyst and to assess stability. We will contact you with an appointment when it gets closer to that date.  If you are age 80 or older, your body mass index should be between 23-30. Your Body mass index is 24.32 kg/m. If this is out of the aforementioned range listed, please consider follow up with your Primary Care Provider.  If you are age 47 or younger, your body mass index should be between 19-25. Your Body mass index is 24.32 kg/m. If this is out of the aformentioned range listed, please consider follow up with your Primary Care Provider.

## 2015-12-18 NOTE — Progress Notes (Signed)
Subjective:    Patient ID: Corey Fischer, male    DOB: July 04, 1931, 82 y.o.   MRN: [id number]  HPI Corey Fischer is an 82 year old male with a history of GERD, history of colon polyps, IBS with possible SIBO, small pancreatic cyst is here for follow-up. I met him during hospitalization in 2016 when he was admitted for intermittent episodes of abdominal pain. There was a question of chronic mesenteric ischemia though he had not had weight loss. He underwent upper endoscopy and colonoscopy at that time. EGD revealed Schatzki's ring balloon dilated to 18 mm. Colonoscopy revealed a 5 mm descending colon tubular adenoma which was removed, mild diverticulosis at the hepatic flexure and internal hemorrhoids. CT scan was performed during this hospitalization in unremarkable from a GI perspective. He did have a diverticulum arising in the descending duodenum without diverticulitis. MRI/MRCP was also performed which showed prior cholecystectomy without biliary ductal dilatation. A 6 mm cystic focus in the pancreatic head question tiny pseudocyst. During this hospitalization he was treated with rifaximin 14 days for IBS and possible bacterial overgrowth.  He was seen again in the office on 10/18/2015 by Alonza Bogus, PA-C describing recurrent mid and lower abdominal pain which felt like a gas pain. Occasionally associated with nausea. Felt increased intestinal gas. Denied rectal bleeding or melena. Retreatment with rifaximin was recommended it took several weeks for him to obtain it but eventually he did take another 14 day course. Today he is feeling well and states he's not sure the rifaximin helped him. Time he will obtain the medication his symptoms have mostly resolved. He denies abdominal pain recently. No nausea or vomiting. His reflux is well controlled with pantoprazole 40 mg daily. He occasionally will use famotidine in the evening if he eats late or eats a reflux promoting meal. He is using MiraLAX daily which  helps tremendously with his constipation. He denies blood in his stool or melena. No recent lower abdominal pressure, bloating or trapped intestinal gas feeling.  Review of Systems As per history of present illness, otherwise negative  Current Medications, Allergies, Past Medical History, Past Surgical History, Family History and Social History were reviewed in [facility] record.     Objective:   Physical Exam BP (!) 120/50 (BP Location: Left Arm, Patient Position: Sitting, Cuff Size: Normal)   Pulse (!) 56   Ht 5' 6.75" (1.695 m)   Wt 154 lb 2 oz (69.9 kg)   BMI 24.32 kg/m  Constitutional: Well-developed and well-nourished. No distress. HEENT: Normocephalic and atraumatic.  Conjunctivae are normal.  No scleral icterus. Neck: Neck supple. Trachea midline. Cardiovascular: Normal rate, regular rhythm and intact distal pulses. No M/R/G Pulmonary/chest: Effort normal and breath sounds normal. No wheezing, rales or rhonchi. Abdominal: Soft, nontender, nondistended. Bowel sounds active throughout. There are no masses palpable. No hepatosplenomegaly. Extremities: no clubbing, cyanosis, or edema Neurological: Alert and oriented to person place and time. Skin: Skin is warm and dry. No rashes noted. Psychiatric: Normal mood and affect. Behavior is normal.  CBC    Component Value Date/Time   WBC 6.7 10/18/2015 1446   RBC 5.15 10/18/2015 1446   HGB 15.2 10/18/2015 1446   HGB 14.2 02/27/2010 1525   HCT 45.1 10/18/2015 1446   HCT 41.0 02/27/2010 1525   PLT 141.0 (L) 10/18/2015 1446   PLT 135 (L) 02/27/2010 1525   MCV 87.6 10/18/2015 1446   MCV 90.8 02/27/2010 1525   MCH 30.2 03/21/2015 1220   MCHC 33.7 10/18/2015  1446   RDW 13.7 10/18/2015 1446   RDW 13.5 02/27/2010 1525   LYMPHSABS 1.9 10/18/2015 1446   LYMPHSABS 1.7 02/27/2010 1525   MONOABS 0.5 10/18/2015 1446   MONOABS 0.5 02/27/2010 1525   EOSABS 0.2 10/18/2015 1446   EOSABS 0.1 02/27/2010 1525    BASOSABS 0.0 10/18/2015 1446   BASOSABS 0.0 02/27/2010 1525   CMP     Component Value Date/Time   NA 139 10/18/2015 1446   K 5.0 10/18/2015 1446   CL 104 10/18/2015 1446   CO2 30 10/18/2015 1446   GLUCOSE 91 10/18/2015 1446   BUN 16 10/18/2015 1446   CREATININE 1.05 10/18/2015 1446   CREATININE 1.04 08/15/2014 1033   CALCIUM 9.7 10/18/2015 1446   PROT 6.8 10/18/2015 1446   ALBUMIN 4.4 10/18/2015 1446   AST 15 10/18/2015 1446   ALT 16 10/18/2015 1446   ALKPHOS 52 10/18/2015 1446   BILITOT 0.8 10/18/2015 1446   GFRNONAA 59 (L) 03/22/2015 0403   GFRAA >60 03/22/2015 0403   CT abd and MRI abd + MRCP reviewed today including with the patient and his wife    Assessment & Plan:  82 year old male with a history of GERD, history of colon polyps, IBS with possible SIBO, small pancreatic cyst is here for follow-up.  1. GERD -- Well-controlled currently. We discussed the risks, benefits and alternatives to PPI medication today. After discussion he wishes to continue pantoprazole 40 mg daily. He can use famotidine 20 mg in the evening as needed for breakthrough heartburn or if he plans to eat a meal late at night or meal with acidic foods.  2. Constipation -- mild. Continue MiraLAX 17 g daily which is working well for him currently  3. Lower abdominal pain/gas and bloating -- difficult to understand what is caused his intermittent symptoms in the mid and lower abdomen. I would not expect pancreas cyst to be symptomatic, see #4. This may be related to abdominal adhesive disease causing trapped intestinal gas and symptoms. Given that it self resolved I would not expect bacterial overgrowth. Not currently an issue, he will notify us if this recurs.  4. Small pancreatic cyst -- I recommended repeat MRI abdomen with contrast to evaluate the stability of the pancreatic cystic lesion seen in October 2016.  6-12 month follow-up, sooner as needed 25 minutes spent with the patient today. Greater than  50% was spent in counseling and coordination of care with the patient

## 2016-01-09 ENCOUNTER — Ambulatory Visit (INDEPENDENT_AMBULATORY_CARE_PROVIDER_SITE_OTHER): Payer: Medicare Other | Admitting: Family Medicine

## 2016-01-09 ENCOUNTER — Encounter: Payer: Self-pay | Admitting: Family Medicine

## 2016-01-09 ENCOUNTER — Encounter: Payer: Self-pay | Admitting: Cardiovascular Disease

## 2016-01-09 VITALS — BP 128/70 | HR 52 | Temp 97.5°F | Ht 67.75 in | Wt 153.2 lb

## 2016-01-09 DIAGNOSIS — N401 Enlarged prostate with lower urinary tract symptoms: Secondary | ICD-10-CM | POA: Diagnosis not present

## 2016-01-09 DIAGNOSIS — E538 Deficiency of other specified B group vitamins: Secondary | ICD-10-CM

## 2016-01-09 DIAGNOSIS — E785 Hyperlipidemia, unspecified: Secondary | ICD-10-CM | POA: Diagnosis not present

## 2016-01-09 DIAGNOSIS — D473 Essential (hemorrhagic) thrombocythemia: Secondary | ICD-10-CM | POA: Diagnosis not present

## 2016-01-09 DIAGNOSIS — I6523 Occlusion and stenosis of bilateral carotid arteries: Secondary | ICD-10-CM

## 2016-01-09 DIAGNOSIS — R351 Nocturia: Secondary | ICD-10-CM | POA: Diagnosis not present

## 2016-01-09 DIAGNOSIS — Z0000 Encounter for general adult medical examination without abnormal findings: Secondary | ICD-10-CM | POA: Diagnosis not present

## 2016-01-09 DIAGNOSIS — R739 Hyperglycemia, unspecified: Secondary | ICD-10-CM | POA: Diagnosis not present

## 2016-01-09 DIAGNOSIS — I10 Essential (primary) hypertension: Secondary | ICD-10-CM | POA: Diagnosis not present

## 2016-01-09 LAB — LIPID PANEL
Cholesterol: 118 mg/dL (ref 0–200)
HDL: 47 mg/dL (ref >39.00)
LDL CALC: 46 mg/dL (ref 0–99)
NONHDL: 71.45
Total CHOL/HDL Ratio: 3
Triglycerides: 125 mg/dL (ref 0.0–149.0)
VLDL: 25 mg/dL (ref 0.0–40.0)

## 2016-01-09 LAB — COMPREHENSIVE METABOLIC PANEL
ALT: 15 U/L (ref 0–53)
AST: 14 U/L (ref 0–37)
Albumin: 4.4 g/dL (ref 3.5–5.2)
Alkaline Phosphatase: 59 U/L (ref 39–117)
BUN: 17 mg/dL (ref 6–23)
CALCIUM: 9.6 mg/dL (ref 8.4–10.5)
CHLORIDE: 102 meq/L (ref 96–112)
CO2: 32 meq/L (ref 19–32)
Creatinine, Ser: 1.15 mg/dL (ref 0.40–1.50)
GFR: 64.72 mL/min (ref >60.00)
Glucose, Bld: 104 mg/dL — ABNORMAL HIGH (ref 70–99)
Potassium: 5 mEq/L (ref 3.5–5.1)
Sodium: 139 mEq/L (ref 135–145)
Total Bilirubin: 0.8 mg/dL (ref 0.2–1.2)
Total Protein: 6.9 g/dL (ref 6.0–8.3)

## 2016-01-09 LAB — CBC
HEMATOCRIT: 43.8 % (ref 39.0–52.0)
HEMOGLOBIN: 15 g/dL (ref 13.0–17.0)
MCHC: 34.1 g/dL (ref 30.0–36.0)
MCV: 88.3 fl (ref 78.0–100.0)
PLATELETS: 135 10*3/uL — AB (ref 150.0–400.0)
RBC: 4.97 Mil/uL (ref 4.22–5.81)
RDW: 13.5 % (ref 11.5–15.5)
WBC: 6.6 10*3/uL (ref 4.0–10.5)

## 2016-01-09 LAB — PSA
PSA: 2.85
PSA: 2.85 ng/mL (ref 0.10–4.00)

## 2016-01-09 LAB — VITAMIN B12: Vitamin B-12: >1500 pg/mL — ABNORMAL HIGH (ref 211–911)

## 2016-01-09 LAB — HEMOGLOBIN A1C: Hgb A1c MFr Bld: 5.9 % (ref 4.6–6.5)

## 2016-01-09 MED ORDER — TETANUS-DIPHTH-ACELL PERTUSSIS 5-2.5-18.5 LF-MCG/0.5 IM SUSP: 0.5000 mL | Freq: Once | INTRAMUSCULAR | 0 refills | Status: AC

## 2016-01-09 MED ORDER — AMLODIPINE BESYLATE 2.5 MG PO TABS: 2.5000 mg | ORAL_TABLET | Freq: Every day | ORAL | 3 refills | Status: DC

## 2016-01-09 MED ORDER — ONDANSETRON 4 MG PO TBDP: ORAL_TABLET | ORAL | 1 refills | Status: DC

## 2016-01-09 MED ORDER — DIAZEPAM 5 MG PO TABS: 2.5000 mg | ORAL_TABLET | Freq: Every evening | ORAL | 5 refills | Status: DC | PRN

## 2016-01-09 NOTE — Progress Notes (Signed)
Pre visit review using our clinic review tool, if applicable. No additional management support is needed unless otherwise documented below in the visit note. 

## 2016-01-09 NOTE — Patient Instructions (Addendum)
Mr. Gildersleeve , Thank you for taking time to come for your Medicare Wellness Visit. I appreciate your ongoing commitment to your health goals. Please review the following plan we discussed and let me know if I can assist you in the future.   These are the goals we discussed: 1. Labs before you go 2. Keep up great job with exercise 3. Get your tetanus shot and update [place] when you do 4. Flu shot in October or November 5. Follow up MRI appears planned through GI so if this has not been done within 6 months- would follow up with them in office    This is a list of the screening recommended for you and due dates:  Health Maintenance  Topic Date Due  . Tetanus Vaccine  05/21/2015  . Flu Shot  12/19/2015  . Shingles Vaccine  Completed  . Pneumonia vaccines  Completed

## 2016-01-09 NOTE — Progress Notes (Signed)
Phone: [phone]  Subjective:  Patient presents today for their annual wellness visit.    Preventive Screening-Counseling & Management  Smoking Status: Never Smoker Second Hand Smoking status: No smokers in home  Risk Factors Regular exercise: treadmill on incline, stairmaster- 75 flights in 25 mins recently, ellipitical, weights. 5 days 1-2 hours.  Diet: very reasonable- maintaining weight  Fall Risk: None  Fall Risk  01/09/2016 07/06/2014 05/07/2013  Falls in the past year? No No No    Cardiac risk factors:  advanced age (older than 74 for men, 61 for women)  Hyperlipidemia - atorvastatin controlling No diabetes. But at risk Family History: in mother Known coronary artery stenosis- has been stable   Depression Screen None. PHQ2 0  Depression screen [facility] 2/9 01/09/2016 07/06/2014 05/07/2013 08/02/2011  Decreased Interest 0 0 0 0  Down, Depressed, Hopeless 0 0 0 1  PHQ - 2 Score 0 0 0 1  Some recent data might be hidden    Activities of Daily Living Independent ADLs and IADLs- wife manages the finances  Hearing Difficulties: -patient endorses, tested 2 years ago borderline- considering repeat eval  Cognitive Testing No reported trouble.   Normal 3 word recall  List the Names of Other Physician/Practitioners you currently use: -Dr. Gwenlyn Found cardiology -Dr. Hilarie Fredrickson GI - Dr. Brigid Re hip - PA McDonald dermatology - Dr. Herbert Deaner optho  Immunization History  Administered Date(s) Administered  . H1N1 05/03/2008  . Influenza Split 02/23/2013  . Influenza Whole 05/20/2002, 03/01/2009  . Influenza,inj,Quad PF,36+ Mos 02/24/2014, 02/28/2015  . Pneumococcal Conjugate-13 05/07/2013  . Pneumococcal Polysaccharide-23 05/20/2005  . Td 05/20/2005  . Zoster 05/21/2011   Required Immunizations needed today : see below  Screening tests- up to date Health Maintenance Due  Topic Date Due  . TETANUS/TDAP - printed rx 05/21/2015  . INFLUENZA VACCINE - plans October  november 12/19/2015   ROS- No pertinent positives discovered in course of AWV ROS- No chest pain or shortness of breath. No headache or blurry vision. Does get palpitations.   The following were reviewed and entered/updated in epic: Past Medical History:  Diagnosis Date  . Abdominal pain 03/19/2015  . Allergy   . Anxiety   . Arthritis   . Asymptomatic bilateral carotid artery stenosis    a. Last duplex 11/2013 R bulb and ICA - 50-69%, R ECA >50%. L bulb and prox ICA: 0-49%. Repeat due 11/2014.  [name] B12 deficiency   . Bilateral carotid artery disease ([place])   . BPH (benign prostatic hyperplasia)   . Chronic ITP (idiopathic thrombocytopenia) (H[facility])   . Depression   . Diverticulosis   . Elevated PSA   . Gastric polyp   . Gastritis   . GERD (gastroesophageal reflux disease)   . H/O hiatal hernia   . Hemorrhoids   . History of hiatal hernia   . Hx of adenomatous colonic polyps 2010  . Hyperlipidemia   . Hypertension   . INTERNAL HEMORRHOIDS WITHOUT MENTION COMP 08/14/2007   Qualifier: Diagnosis of  By: Arnoldo Morale MD, John E   . Osteoarthritis hips  . PAC (premature atrial contraction)    a. NSR/SB with 1st degree AVB and PACs/atrial bigeminy seen on event monitor 11/2012.  [name] Palpitations   . Raynaud's syndrome   . Sinus bradycardia   . Stricture and stenosis of esophagus 2010   last endoscopy- 2010  . Symptomatic PVCs    a. Per report, monitor 05/2012 - NSR with symptomatic PVC. b. Echo 05/2012- EF 55-60%, aortic sclerosis  without stenosis. c. Nuc 12/2012 - normal.  . Wandering (atrial) pacemaker    Patient Active Problem List   Diagnosis Date Noted  . Pancreatic cyst 03/28/2015    Priority: High  . Carotid artery stenosis 11/05/2007    Priority: High  . Hyperglycemia 07/06/2014    Priority: Medium  . Symptomatic PVCs     Priority: Medium  . Bradycardia 12/10/2012    Priority: Medium  . PAC (premature atrial contraction), symptomatic 10/16/2012    Priority: Medium  . Benign  prostatic hypertrophy (BPH) with nocturia 07/22/2011    Priority: Medium  . THROMBOCYTHEMIA 04/21/2008    Priority: Medium  . Essential hypertension 04/29/2007    Priority: Medium  . Hyperlipidemia 01/22/2007    Priority: Medium  . Anxiety state, unspecified 12/30/2013    Priority: Low  . Osteoarthritis 03/16/2010    Priority: Low  . Osteopenia 07/06/2009    Priority: Low  . Raynaud's syndrome 06/02/2009    Priority: Low  . Prostatitis 03/01/2009    Priority: Low  . CONSTIPATION, CHRONIC 07/28/2008    Priority: Low  . ESOPHAGEAL STRICTURE 03/04/2008    Priority: Low  . COLONIC POLYPS, HX OF 03/04/2008    Priority: Low  . Unspecified visual loss 10/30/2007    Priority: Low  . Vitamin B12 deficiency 01/26/2007    Priority: Low  . GERD 01/22/2007    Priority: Low  . Generalized abdominal pain 10/18/2015  . Bloating 10/18/2015  . Schatzki's ring    Past Surgical History:  Procedure Laterality Date  . APPENDECTOMY  age 89  . CARDIAC CATHETERIZATION  1989   Told that he is "pristine "  . CAROTID DUPLEX Bilateral 05-26-2007  . CHOLECYSTECTOMY  1989  . COLONOSCOPY WITH PROPOFOL N/A 03/22/2015   Procedure: COLONOSCOPY WITH PROPOFOL;  Surgeon: Jerene Bears, MD;  Location: [facility];  Service: Endoscopy;  Laterality: N/A;  . ESOPHAGOGASTRODUODENOSCOPY (EGD) WITH PROPOFOL N/A 03/22/2015   Procedure: ESOPHAGOGASTRODUODENOSCOPY (EGD) WITH PROPOFOL;  Surgeon: Jerene Bears, MD;  Location: [facility];  Service: Endoscopy;  Laterality: N/A;  . LAPAROSCOPIC INGUINAL HERNIA REPAIR Bilateral 09-19-2010  . LEFT SHOULDER SURG.  2006  . ROTATOR CUFF REPAIR Right 1987  . TONSILLECTOMY  age 75  . TOTAL HIP ARTHROPLASTY Left 05/31/2013   Procedure: TOTAL HIP ARTHROPLASTY;  Surgeon: Kerin Salen, MD;  Location: [facility];  Service: Orthopedics;  Laterality: Left;  . TRANSURETHRAL RESECTION OF PROSTATE  07/22/2011   Procedure: TRANSURETHRAL RESECTION OF THE PROSTATE WITH GYRUS INSTRUMENTS;   Surgeon: Bernestine Amass, MD;  [facility];  Service: Urology;  Laterality: N/A;  Saline Gyrus TURP     Family History  Problem Relation Age of Onset  . Heart disease Mother   . Colon polyps Mother   . Heart disease Father   . Angina Father   . Cancer Maternal Grandmother   . Heart failure Maternal Grandfather   . Colon cancer Neg Hx     Medications- reviewed and updated Current Outpatient Prescriptions  Medication Sig Dispense Refill  . Ascorbic Acid (VITAMIN C) 1000 MG tablet Take 1,000 mg by mouth daily.     [name] aspirin 81 MG tablet Take 81 mg by mouth daily.    [name] atenolol (TENORMIN) 25 MG tablet Take 0.5 tablets (12.5 mg total) by mouth daily. 45 tablet 3  . atorvastatin (LIPITOR) 20 MG tablet TAKE 1 TABLET(20 MG) BY MOUTH DAILY 30 tablet 5  . cetirizine (ZYRTEC) 10 MG tablet Take  10 mg by mouth daily with breakfast.     . Cholecalciferol (VITAMIN D3) 2000 units capsule Take 2,000 Units by mouth daily.    . Coenzyme Q10 50 MG CAPS Take 250 mg by mouth daily.     . Cyanocobalamin 2500 MCG SUBL Place 2,500 mcg under the tongue daily.     . diazepam (VALIUM) 5 MG tablet Take 2.5-5 mg by mouth at bedtime as needed for anxiety or sedation.    . hydrocortisone-pramoxine (ANALPRAM-HC) 2.5-1 % rectal cream Place 1 application rectally as needed.     . Magnesium 200 MG TABS Take 200 mg by mouth daily.     . ondansetron (ZOFRAN ODT) 4 MG disintegrating tablet Place 1 tablet on the tongue every 6 hours as needed for nausea. 30 tablet 1  . pantoprazole (PROTONIX) 40 MG tablet Take 1 tablet (40 mg total) by mouth daily. 90 tablet 4  . polyethylene glycol (MIRALAX / GLYCOLAX) packet Take 17 g by mouth daily.     . promethazine (PHENERGAN) 25 MG suppository Place 25 mg rectally every 6 (six) hours as needed for nausea or vomiting.    . Zinc 25 MG TABS Take 25 mg by mouth daily.      No current facility-administered medications for this visit.     Allergies-reviewed  and updated Allergies  Allergen Reactions  . Ciprofloxacin Other (See Comments)    Unknown reaction    Social History   Social History  . Marital status: Married    Spouse name: N/A  . Number of children: 2  . Years of education: N/A   Occupational History  . retired Retired   Social History Main Topics  . Smoking status: Never Smoker  . Smokeless tobacco: Never Used  . Alcohol use Yes     Comment: occasionally  . Drug use: No  . Sexual activity: Yes   Other Topics Concern  . None   Social History Narrative   Grew up in small town in [place]. 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 Met wife in [place]. At 70, got married and went to college-undergrad in [employer] as well as Oceanographer, doctorate in Location manager.  Worked at [employer] which eventually became [employer]. Lived in [place] [place] and [place] before eventually moving to [place]. 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       Married over 50 years. 51 years 01/2014. 2 married children with 2 children each. 1 son near [place]. 1 son in [place] with house of rep.       Hobbies: work out most mornings 5 days a week (spin, elipitical), racquetball until 2014 after hip surgery.     Objective: BP 128/70   Pulse (!) 52   Temp 97.5 F (36.4 C) (Oral)   Ht 5' 7.75" (1.721 m)   Wt 153 lb 3.2 oz (69.5 kg)   SpO2 98%   BMI 23.47 kg/m  Gen: NAD, resting comfortably HEENT: Mucous membranes are moist. Oropharynx normal Neck: no thyromegaly CV: regular rate but bradycardic no murmurs rubs or gallops Lungs: CTAB no crackles, wheeze, rhonchi Abdomen: soft/nontender/nondistended/normal bowel sounds. No rebound or guarding.  Rectal: normal tone, normal size to mildly enlarged prostate, no masses or tenderness Ext: no edema Skin: warm, dry Neuro: grossly normal, moves all extremities, PERRLA  Assessment/Plan:  AWV completed- discussed recommended screenings anddocumented any personalized  health advice and referrals for preventive counseling. See AVS as well which was given to patient.  Status of chronic or acute concerns   Hypertension S: controlled on atenolol 12.45m and amlodipine 2.516m This morning was 125/57 before leaving home.  BP Readings from Last 3 Encounters:  01/09/16 128/70  12/18/15 (!) 120/50  10/18/15 (!) 156/56  A/P:Continue current meds:  Controlled on repeat  S: well controlled on last check on atorvastatin 2062mNo myalgias. Known atheroscloersis on CT and history mesenteric ischemia Lab Results  Component Value Date   CHOL 115 06/02/2015   HDL 37.40 (L) 06/02/2015   LDLCALC 50 06/02/2015   LDLDIRECT 84.9 03/01/2009   TRIG 137.0 06/02/2015   CHOLHDL 3 06/02/2015   A/P: update lipids today  Pancreatic cyst- MRI planned 03/2016  Carotid stenosis- S: 40-59% bilateral ICA stenosis- stable from year prior with 1 year repeat planned. LDL goal <70 A/P: continue BP and lipid control as well as aspirin  Symptomatic PVCs controlled on atenolol but causes bradycardia  Hyperglycemia- update fasting CBG today Lab Results  Component Value Date   HGBA1C 5.9 01/13/2015   BPH with history turp- followed by Dr. GraRisa Grill past. Now released to primary care. Set firm limit of 85 on PSA. He would very much like to continue at present  Update b12 today with history deficiency now on orals  Insomnia- controlled with very sparing diazepam before bed. Last rx over year old- so provided update   1 year awv  Orders Placed This Encounter  Procedures  . Hemoglobin A1c    [place]  . Comprehensive metabolic panel    [place]    Order Specific Question:   Has the patient fasted?    Answer:   No  . CBC    [place]  . Vitamin B12  . Lipid panel        Order Specific Question:   Has the patient fasted?    Answer:   No  . PSA    Meds ordered this encounter  Medications  . ondansetron (ZOFRAN ODT) 4 MG disintegrating tablet    Sig: Place 1  tablet on the tongue every 6 hours as needed for nausea.    Dispense:  30 tablet    Refill:  1  . Tdap (BOOSTRIX) 5-2.5-18.5 LF-MCG/0.5 injection    Sig: Inject 0.5 mLs into the muscle once.    Dispense:  0.5 mL    Refill:  0  . amLODipine (NORVASC) 2.5 MG tablet    Sig: Take 1 tablet (2.5 mg total) by mouth at bedtime.    Dispense:  90 tablet    Refill:  3  . diazepam (VALIUM) 5 MG tablet    Sig: Take 0.5-1 tablets (2.5-5 mg total) by mouth at bedtime as needed for anxiety (or sleep).    Dispense:  30 tablet    Refill:  5    Return precautions advised.  SteGarret ReddishD

## 2016-01-10 ENCOUNTER — Telehealth: Payer: Self-pay | Admitting: Pharmacist

## 2016-01-10 NOTE — Telephone Encounter (Signed)
Spoke to patient he is extremely hesitant to change to another beta blocker because he has had a bad reaction in the past. Looking at the chart it appears he has tried metoprolol and propranolol in the past but he does not recall which one he had the negative reaction to.  He is going to contact [facility] to see if they are able to transfer a supply from a different store. He is also going to check with other pharmacies to see if they may have a supply to get him through until Sept when his [facility] should be able to get the medication in stock.   If he is unable to find atenolol he will have pharmacy contact our office and he will call us as well.

## 2016-01-19 ENCOUNTER — Other Ambulatory Visit: Payer: Self-pay

## 2016-02-12 ENCOUNTER — Telehealth: Payer: Self-pay | Admitting: *Deleted

## 2016-02-12 DIAGNOSIS — K862 Cyst of pancreas: Secondary | ICD-10-CM

## 2016-02-12 NOTE — Telephone Encounter (Signed)
Patient has been scheduled for MRI abdomen w-w/o MRCP at [facility] radiology on Friday, 02/16/16 at 4 pm with 3:45 pm arrival. Nothing to eat or drink 4 hours prior. I have left a voicemail for patient to call back.

## 2016-02-12 NOTE — Telephone Encounter (Signed)
-----   Message from Larina Bras, [place] sent at 12/18/2015  2:09 PM EDT ----- See 12/18/15 office note. Needs MRI pancreatic protocol (w/ contrast) abdomen only in 02/2016 for follow up previously seen pancreatic cysts; assess stability. Schedule appt and call pt.

## 2016-02-12 NOTE — Telephone Encounter (Signed)
MRI abd with and without with MRCP is most complete and can be ordered

## 2016-02-12 NOTE — Telephone Encounter (Signed)
Dr Hilarie Fredrickson- I have a note to have patient get MRI abdomen with contrast pancratic protocol. However, when I called to schedule, I was told that patient would need a MR and with and without with MRCP. Is this correct?

## 2016-02-13 NOTE — Telephone Encounter (Signed)
Left voicemail for patient to call back. 

## 2016-02-14 NOTE — Telephone Encounter (Signed)
Pt called back. °

## 2016-02-14 NOTE — Telephone Encounter (Signed)
Patient has been advised of MRI/MRCP prep/location/date/time and verbalizes understanding.

## 2016-02-16 ENCOUNTER — Ambulatory Visit (HOSPITAL_COMMUNITY)
Admission: RE | Admit: 2016-02-16 | Discharge: 2016-02-16 | Disposition: A | Discharge status: Home / Self Care | Payer: Medicare Other | Source: Ambulatory Visit | Attending: Internal Medicine | Admitting: Internal Medicine

## 2016-02-16 ENCOUNTER — Other Ambulatory Visit: Payer: Self-pay | Admitting: Internal Medicine

## 2016-02-16 DIAGNOSIS — I700 Atherosclerosis of aorta: Secondary | ICD-10-CM | POA: Insufficient documentation

## 2016-02-16 DIAGNOSIS — R935 Abnormal findings on diagnostic imaging of other abdominal regions, including retroperitoneum: Secondary | ICD-10-CM | POA: Diagnosis not present

## 2016-02-16 DIAGNOSIS — K862 Cyst of pancreas: Secondary | ICD-10-CM

## 2016-02-16 DIAGNOSIS — Z9049 Acquired absence of other specified parts of digestive tract: Secondary | ICD-10-CM | POA: Insufficient documentation

## 2016-02-16 MED ORDER — GADOBENATE DIMEGLUMINE 529 MG/ML IV SOLN
13.0000 mL | Freq: Once | INTRAVENOUS | Status: AC | PRN
  Administered 2016-02-16: 13 mL via INTRAVENOUS

## 2016-02-19 DIAGNOSIS — H348322 Tributary (branch) retinal vein occlusion, left eye, stable: Secondary | ICD-10-CM | POA: Diagnosis not present

## 2016-02-19 DIAGNOSIS — H43812 Vitreous degeneration, left eye: Secondary | ICD-10-CM | POA: Diagnosis not present

## 2016-02-19 DIAGNOSIS — G453 Amaurosis fugax: Secondary | ICD-10-CM | POA: Diagnosis not present

## 2016-02-28 ENCOUNTER — Ambulatory Visit (INDEPENDENT_AMBULATORY_CARE_PROVIDER_SITE_OTHER): Payer: Medicare Other | Admitting: Family Medicine

## 2016-02-28 DIAGNOSIS — Z23 Encounter for immunization: Secondary | ICD-10-CM

## 2016-03-09 ENCOUNTER — Other Ambulatory Visit: Payer: Self-pay | Admitting: Family Medicine

## 2016-04-15 ENCOUNTER — Encounter: Payer: Self-pay | Admitting: Family Medicine

## 2016-04-17 ENCOUNTER — Ambulatory Visit (INDEPENDENT_AMBULATORY_CARE_PROVIDER_SITE_OTHER): Payer: Medicare Other | Admitting: Family Medicine

## 2016-04-17 ENCOUNTER — Encounter: Payer: Self-pay | Admitting: Family Medicine

## 2016-04-17 VITALS — BP 130/64 | HR 71 | Temp 98.3°F | Resp 12 | Ht 67.75 in | Wt 152.5 lb

## 2016-04-17 DIAGNOSIS — I6529 Occlusion and stenosis of unspecified carotid artery: Secondary | ICD-10-CM

## 2016-04-17 DIAGNOSIS — J069 Acute upper respiratory infection, unspecified: Secondary | ICD-10-CM

## 2016-04-17 DIAGNOSIS — M791 Myalgia, unspecified site: Secondary | ICD-10-CM

## 2016-04-17 LAB — POCT INFLUENZA A/B
INFLUENZA A, POC: NEGATIVE
Influenza B, POC: NEGATIVE

## 2016-04-17 MED ORDER — BENZONATATE 100 MG PO CAPS: 200.0000 mg | ORAL_CAPSULE | Freq: Two times a day (BID) | ORAL | 0 refills | Status: AC | PRN

## 2016-04-17 NOTE — Progress Notes (Signed)
HPI:  ACUTE VISIT:  Chief Complaint  Patient presents with  . Cough    started saturday, no fever  . skin sensitivity    started yesterday    Corey Fischer is a 82 y.o. male, who is here today with his wife complaining of 3-4 days of respiratory symptoms. On 04/13/2016 he started with nonproductive cough. Chest wall pain upon coughing.  He is more concerned now about "skin sensitivity", he is reporting soreness and burning like sensation on back, abdomen, and extremities. He has not noted any rash or skin changes. He has prior history of shingles and is worried about this being the beginning of a flareup. He denies headache, focal weakness, or MS changes.  Symptoms are exacerbated by movement and alleviated by rest and ibuprofen.  + Nasal congestion, rhinorrhea, sore throat, and post nasal drainage. Denies chills, fever, dyspnea, wheezing, edema, changes in bowel habits, nausea, or vomiting. He mentions that yesterday he had diffuse abdominal pain, constant("pulsing"), which lasted about an hour. Bowel movement was last night.  No Hx of recent travel. Sick contact: No known No known insect bite. No Hx of allergies.  Medication OTC for this problem: Ibuprofen, last dose was last night.   Symptoms otherwise stable.    Review of Systems  Constitutional: Positive for fatigue. Negative for activity change, appetite change, chills and fever.  HENT: Positive for congestion, postnasal drip, rhinorrhea, sore throat and voice change. Negative for ear pain, mouth sores and trouble swallowing.   Eyes: Negative for discharge, redness and itching.  Respiratory: Positive for cough. Negative for shortness of breath and wheezing.   Cardiovascular: Negative for palpitations and leg swelling.  Gastrointestinal: Negative for abdominal pain, diarrhea, nausea and vomiting.  Musculoskeletal: Positive for arthralgias and myalgias. Negative for joint swelling and neck pain.  Skin:  Negative for color change and rash.  Allergic/Immunologic: Negative for environmental allergies.  Neurological: Negative for syncope, weakness, numbness and headaches.  Hematological: Negative for adenopathy. Does not bruise/bleed easily.  Psychiatric/Behavioral: Negative for confusion.      Current Outpatient Prescriptions on File Prior to Visit  Medication Sig Dispense Refill  . amLODipine (NORVASC) 2.5 MG tablet Take 1 tablet (2.5 mg total) by mouth at bedtime. 90 tablet 3  . Ascorbic Acid (VITAMIN C) 1000 MG tablet Take 1,000 mg by mouth daily.     [name] aspirin 81 MG tablet Take 81 mg by mouth daily.    [name] atenolol (TENORMIN) 25 MG tablet Take 0.5 tablets (12.5 mg total) by mouth daily. 45 tablet 3  . atorvastatin (LIPITOR) 20 MG tablet TAKE 1 TABLET(20 MG) BY MOUTH DAILY 30 tablet 5  . cetirizine (ZYRTEC) 10 MG tablet Take 10 mg by mouth daily with breakfast.     . Cholecalciferol (VITAMIN D3) 2000 units capsule Take 2,000 Units by mouth daily.    . Coenzyme Q10 50 MG CAPS Take 250 mg by mouth daily.     . Cyanocobalamin 2500 MCG SUBL Place 2,500 mcg under the tongue daily.     . diazepam (VALIUM) 5 MG tablet Take 0.5-1 tablets (2.5-5 mg total) by mouth at bedtime as needed for anxiety (or sleep). 30 tablet 5  . hydrocortisone-pramoxine (ANALPRAM-HC) 2.5-1 % rectal cream Place 1 application rectally as needed.     . Magnesium 200 MG TABS Take 200 mg by mouth daily.     . ondansetron (ZOFRAN ODT) 4 MG disintegrating tablet Place 1 tablet on the tongue every 6 hours as  needed for nausea. 30 tablet 1  . pantoprazole (PROTONIX) 40 MG tablet Take 1 tablet (40 mg total) by mouth daily. 90 tablet 4  . polyethylene glycol (MIRALAX / GLYCOLAX) packet Take 17 g by mouth daily.     . promethazine (PHENERGAN) 25 MG suppository Place 25 mg rectally every 6 (six) hours as needed for nausea or vomiting.    . Zinc 25 MG TABS Take 25 mg by mouth daily.      No current facility-administered medications  on file prior to visit.      Past Medical History:  Diagnosis Date  . Abdominal pain 03/19/2015  . Allergy   . Anxiety   . Arthritis   . Asymptomatic bilateral carotid artery stenosis    a. Last duplex 11/2013 R bulb and ICA - 50-69%, R ECA >50%. L bulb and prox ICA: 0-49%. Repeat due 11/2014.  [name] B12 deficiency   . Bilateral carotid artery disease ([place])   . BPH (benign prostatic hyperplasia)   . Chronic ITP (idiopathic thrombocytopenia) ([facility])   . Depression   . Diverticulosis   . Elevated PSA   . Gastric polyp   . Gastritis   . GERD (gastroesophageal reflux disease)   . H/O hiatal hernia   . Hemorrhoids   . History of hiatal hernia   . Hx of adenomatous colonic polyps 2010  . Hyperlipidemia   . Hypertension   . INTERNAL HEMORRHOIDS WITHOUT MENTION COMP 08/14/2007   Qualifier: Diagnosis of  By: Arnoldo Morale MD, John E   . Osteoarthritis hips  . PAC (premature atrial contraction)    a. NSR/SB with 1st degree AVB and PACs/atrial bigeminy seen on event monitor 11/2012.  [name] Palpitations   . Raynaud's syndrome   . Sinus bradycardia   . Stricture and stenosis of esophagus 2010   last endoscopy- 2010  . Symptomatic PVCs    a. Per report, monitor 05/2012 - NSR with symptomatic PVC. b. Echo 05/2012- EF 55-60%, aortic sclerosis without stenosis. c. Nuc 12/2012 - normal.  . Wandering (atrial) pacemaker    Allergies  Allergen Reactions  . Ciprofloxacin Other (See Comments)    Unknown reaction    Social History   Social History  . Marital status: Married    Spouse name: N/A  . Number of children: 2  . Years of education: N/A   Occupational History  . retired Retired   Social History Main Topics  . Smoking status: Never Smoker  . Smokeless tobacco: Never Used  . Alcohol use Yes     Comment: occasionally  . Drug use: No  . Sexual activity: Yes   Other Topics Concern  . None   Social History Narrative   Grew up in small town in [place]. 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 Met wife in [place]. At 66, got married and went to college-undergrad in [employer] as well as Oceanographer, doctorate in Location manager.  Worked at [employer] which eventually became [employer]. Lived in [facility] and [place] before eventually moving to [place]. 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       Married over 50 years. 21 years 01/2014. 2 married children with 2 children each. 1 son near [place]. 1 son in [place] with house of rep.       Hobbies: work out most mornings 5 days a week (spin, elipitical), racquetball until 2014 after hip surgery.     Vitals:   04/17/16 0946  BP:  130/64  Pulse: 71  Resp: 12  Temp: 98.3 F (36.8 C)   O2 sat 97% at RA.  Body mass index is 23.36 kg/m.   Physical Exam  Nursing note and vitals reviewed. Constitutional: He is oriented to person, place, and time. He appears well-developed. He does not appear ill. No distress.  HENT:  Head: Atraumatic.  Right Ear: Tympanic membrane, external ear and ear canal normal.  Left Ear: Tympanic membrane, external ear and ear canal normal.  Nose: Rhinorrhea present. Right sinus exhibits no maxillary sinus tenderness and no frontal sinus tenderness. Left sinus exhibits no maxillary sinus tenderness and no frontal sinus tenderness.  Mouth/Throat: Uvula is midline and mucous membranes are normal. Posterior oropharyngeal erythema present. No oropharyngeal exudate or posterior oropharyngeal edema.  Mild dysphonia, post nasal drainage.  Eyes: Conjunctivae and EOM are normal.  Cardiovascular: Normal rate.  An irregular rhythm present.  No murmur heard. Pulses:      Dorsalis pedis pulses are 2+ on the right side, and 2+ on the left side.  Respiratory: Effort normal and breath sounds normal. No stridor. No respiratory distress.  GI: Soft. Bowel sounds are normal. He exhibits no abdominal bruit and no mass. There is no hepatomegaly. There is no tenderness.  Musculoskeletal: He exhibits  no edema.       Cervical back: He exhibits no bony tenderness.       Thoracic back: He exhibits no tenderness.       Lumbar back: He exhibits no tenderness.  No major deformities or signs of synovitis appreciated.  Lymphadenopathy:       Head (right side): No submandibular adenopathy present.       Head (left side): No submandibular adenopathy present.    He has no cervical adenopathy.  Neurological: He is alert and oriented to person, place, and time. He has normal strength. Coordination and gait normal.  Skin: Skin is warm. No rash noted. No erythema.  Psychiatric: His speech is normal. His mood appears anxious.  Well groomed, good eye contact.      ASSESSMENT AND PLAN:     Miley was seen today for cough and skin sensitivity.  Diagnoses and all orders for this visit:  URI, acute  Symptoms suggests a viral etiology, I explained patient that symptomatic treatment is usually recommended in this case, so I do not think abx is needed at this time. Instructed to monitor for signs of complications, including new onset of fever among some, clearly instructed about warning signs. I also explained that cough and nasal congestion can last a few days and sometimes weeks. F/U as needed.   -     benzonatate (TESSALON) 100 MG capsule; Take 2 capsules (200 mg total) by mouth 2 (two) times daily as needed for cough. -     POCT Influenza A/B  Myalgia  "Skin sensitivity" he is reporting today seems to be generalized and does not follow dermatomal pattern. I explained I don't think this is related with an acute zoster infection but rather related to acute viral illness. He was instructed about warning signs, he will monitor for rash. Adequate hydration. Recommended taking Tylenol 500 mg 3-4 times per day.       Return if symptoms worsen or fail to improve.     -Mr.DEANTE BLOUGH was advised to return or notify a doctor immediately if symptoms worsen or persist or new concerns arise, he  and his wife voice understanding.       Malka So  [place], [place]. [place] office.

## 2016-04-17 NOTE — Progress Notes (Signed)
Pre visit review using our clinic review tool, if applicable. No additional management support is needed unless otherwise documented below in the visit note. 

## 2016-04-17 NOTE — Patient Instructions (Addendum)
A few things to remember from today's visit:   URI, acute - Plan: benzonatate (TESSALON) 100 MG capsule  viral infections are self-limited and we treat each symptom depending of severity.  Over the counter medications as decongestants and cold medications usually help, they need to be taken with caution if there is a history of high blood pressure or palpitations. Tylenol also helps with most symptoms (headache, muscle aching, fever,etc) Plenty of fluids. Honey helps with cough. Steam inhalations helps with runny nose, nasal congestion, and may prevent sinus infections. Cough and nasal congestion could last a few days and sometimes weeks. Please follow in not any better in 1-2 weeks or if symptoms get worse.  Please be sure medication list is accurate. If a new problem present, please set up appointment sooner than planned today.

## 2016-04-24 ENCOUNTER — Ambulatory Visit (INDEPENDENT_AMBULATORY_CARE_PROVIDER_SITE_OTHER): Payer: Medicare Other

## 2016-04-24 ENCOUNTER — Ambulatory Visit (INDEPENDENT_AMBULATORY_CARE_PROVIDER_SITE_OTHER): Payer: Medicare Other | Admitting: Orthopaedic Surgery

## 2016-04-24 DIAGNOSIS — M25552 Pain in left hip: Secondary | ICD-10-CM

## 2016-04-24 DIAGNOSIS — M7062 Trochanteric bursitis, left hip: Secondary | ICD-10-CM | POA: Diagnosis not present

## 2016-04-24 MED ORDER — METHYLPREDNISOLONE ACETATE 40 MG/ML IJ SUSP
40.0000 mg | INTRAMUSCULAR | Status: AC | PRN
  Administered 2016-04-24: 40 mg via INTRA_ARTICULAR

## 2016-04-24 MED ORDER — LIDOCAINE HCL 1 % IJ SOLN
3.0000 mL | INTRAMUSCULAR | Status: AC | PRN
  Administered 2016-04-24: 3 mL

## 2016-04-24 NOTE — Progress Notes (Signed)
Office Visit Note   Patient: Corey Fischer           Date of Birth: [birth year]           MRN: [id number] Visit Date: 04/24/2016              Requested by: Marin Olp, MD [address] [place], [place] [address] PCP: Garret Reddish, MD   Assessment & Plan: Visit Diagnoses:  1. Pain in left hip   2. Trochanteric bursitis, left hip     Plan: He tolerated in left hip injection well. I like him to decrease his exercise activities over the next 4 weeks. I would like to reevaluate him in 4 weeks and see if the injection is helped.  Follow-Up Instructions: Return in about 4 weeks (around 05/22/2016).   Orders:  Orders Placed This Encounter  Procedures  . Large Joint Injection/Arthrocentesis  . XR HIP UNILAT W OR W/O PELVIS 1V LEFT   No orders of the defined types were placed in this encounter.     Procedures: Large Joint Inj Date/Time: 04/24/2016 3:38 PM Performed by: Mcarthur Rossetti Authorized by: Mcarthur Rossetti   Location:  Hip Site:  L greater trochanter Ultrasound Guidance: No   Fluoroscopic Guidance: No   Arthrogram: No   Medications:  3 mL lidocaine 1 %; 40 mg methylPREDNISolone acetate 40 MG/ML     Clinical Data: No additional findings.   Subjective: Chief Complaint  Patient presents with  . Left Hip - Pain    Patient states he had replacement 3 years ago. Patient states he has had pain on/off since. He states he likes to be active. He can't be with this pain   Is been aggravating pain and weakness that has had since surgery 3 years ago on his left hip. He points the thighs the main source of his pain he feels like he cannot perform his much activities on that leg as he can the other leg due to muscle weakness. HPI  Review of Systems Denies any chest pain, headache, shortness of breath, fever, chills, nausea, vomiting. He has been getting over the flu so he does have a cough and some generalized aches and  pains.  Objective: Vital Signs: There were no vitals taken for this visit.  Physical Exam He is alert and oriented 82 in no acute distress Ortho Exam His left hip has full range of motion with no significant muscle weakness at all. There is pain down the iliotibial band and around the greater trochanteric area. There is also deep pain in the thigh. There is no groin pain. Specialty Comments:  No specialty comments available.  Imaging: Xr Hip Unilat W Or W/o Pelvis 1v Left  Result Date: 04/24/2016 An AP pelvis and lateral of his left hip show well-seated implant. I see no significant, getting features of the hip replacement itself.  I did review the MRI from earlier this year of his left hip. There is small amount of fluid around the greater trochanter suggesting trochanteric bursitis.. There is some slight muscle atrophy of the hip muscles proximally. There was no evidence of loosening.  PMFS History: Patient Active Problem List   Diagnosis Date Noted  . Generalized abdominal pain 10/18/2015  . Bloating 10/18/2015  . Pancreatic cyst 03/28/2015  . Schatzki's ring   . Hyperglycemia 07/06/2014  . Symptomatic PVCs   . Anxiety state, unspecified 12/30/2013  . Bradycardia 12/10/2012  . PAC (premature atrial contraction), symptomatic 10/16/2012  .  Benign prostatic hypertrophy (BPH) with nocturia 07/22/2011  . Osteoarthritis 03/16/2010  . Osteopenia 07/06/2009  . Raynaud's syndrome 06/02/2009  . Prostatitis 03/01/2009  . CONSTIPATION, CHRONIC 07/28/2008  . THROMBOCYTHEMIA 04/21/2008  . ESOPHAGEAL STRICTURE 03/04/2008  . COLONIC POLYPS, HX OF 03/04/2008  . Carotid artery stenosis 11/05/2007  . Unspecified visual loss 10/30/2007  . Essential hypertension 04/29/2007  . Vitamin B12 deficiency 01/26/2007  . Hyperlipidemia 01/22/2007  . GERD 01/22/2007   Past Medical History:  Diagnosis Date  . Abdominal pain 03/19/2015  . Allergy   . Anxiety   . Arthritis   . Asymptomatic  bilateral carotid artery stenosis    a. Last duplex 11/2013 R bulb and ICA - 50-69%, R ECA >50%. L bulb and prox ICA: 0-49%. Repeat due 11/2014.  [name] B12 deficiency   . Bilateral carotid artery disease ([place])   . BPH (benign prostatic hyperplasia)   . Chronic ITP (idiopathic thrombocytopenia) ([facility])   . Depression   . Diverticulosis   . Elevated PSA   . Gastric polyp   . Gastritis   . GERD (gastroesophageal reflux disease)   . H/O hiatal hernia   . Hemorrhoids   . History of hiatal hernia   . Hx of adenomatous colonic polyps 2010  . Hyperlipidemia   . Hypertension   . INTERNAL HEMORRHOIDS WITHOUT MENTION COMP 08/14/2007   Qualifier: Diagnosis of  By: Arnoldo Morale MD, John E   . Osteoarthritis hips  . PAC (premature atrial contraction)    a. NSR/SB with 1st degree AVB and PACs/atrial bigeminy seen on event monitor 11/2012.  [name] Palpitations   . Raynaud's syndrome   . Sinus bradycardia   . Stricture and stenosis of esophagus 2010   last endoscopy- 2010  . Symptomatic PVCs    a. Per report, monitor 05/2012 - NSR with symptomatic PVC. b. Echo 05/2012- EF 55-60%, aortic sclerosis without stenosis. c. Nuc 12/2012 - normal.  . Wandering (atrial) pacemaker     Family History  Problem Relation Age of Onset  . Heart disease Mother   . Colon polyps Mother   . Heart disease Father   . Angina Father   . Cancer Maternal Grandmother   . Heart failure Maternal Grandfather   . Colon cancer Neg Hx     Past Surgical History:  Procedure Laterality Date  . APPENDECTOMY  age 80  . CARDIAC CATHETERIZATION  1989   Told that he is "pristine "  . CAROTID DUPLEX Bilateral 05-26-2007  . CHOLECYSTECTOMY  1989  . COLONOSCOPY WITH PROPOFOL N/A 03/22/2015   Procedure: COLONOSCOPY WITH PROPOFOL;  Surgeon: Jerene Bears, MD;  Location: [facility];  Service: Endoscopy;  Laterality: N/A;  . ESOPHAGOGASTRODUODENOSCOPY (EGD) WITH PROPOFOL N/A 03/22/2015   Procedure: ESOPHAGOGASTRODUODENOSCOPY (EGD) WITH PROPOFOL;   Surgeon: Jerene Bears, MD;  Location: [facility];  Service: Endoscopy;  Laterality: N/A;  . LAPAROSCOPIC INGUINAL HERNIA REPAIR Bilateral 09-19-2010  . LEFT SHOULDER SURG.  2006  . ROTATOR CUFF REPAIR Right 1987  . TONSILLECTOMY  age 64  . TOTAL HIP ARTHROPLASTY Left 05/31/2013   Procedure: TOTAL HIP ARTHROPLASTY;  Surgeon: Kerin Salen, MD;  Location: [facility];  Service: Orthopedics;  Laterality: Left;  . TRANSURETHRAL RESECTION OF PROSTATE  07/22/2011   Procedure: TRANSURETHRAL RESECTION OF THE PROSTATE WITH GYRUS INSTRUMENTS;  Surgeon: Bernestine Amass, MD;  Location: [facility];  Service: Urology;  Laterality: N/A;  Saline Gyrus TURP    Social History   Occupational History  .  retired Retired   Social History Main Topics  . Smoking status: Never Smoker  . Smokeless tobacco: Never Used  . Alcohol use Yes     Comment: occasionally  . Drug use: No  . Sexual activity: Yes

## 2016-04-29 ENCOUNTER — Telehealth: Payer: Self-pay | Admitting: Internal Medicine

## 2016-04-29 DIAGNOSIS — R1084 Generalized abdominal pain: Secondary | ICD-10-CM

## 2016-04-29 NOTE — Telephone Encounter (Signed)
Pt states he is having abdominal pain like he did when he was hospitalized last year. States he was instructed to call and let us know if this happened again. Reports it started Saturday and Sunday but got better but now reports he is having it again today. Pt scheduled to see Dr. Hilarie Fredrickson 05/02/16@9 :45am. Pt aware and Dr. Hilarie Fredrickson notified.

## 2016-04-30 NOTE — Telephone Encounter (Signed)
Pt aware, orders in epic. 

## 2016-04-30 NOTE — Telephone Encounter (Signed)
Would have him come for CBC, CMP and amylase and lipase He has been treated with rifaximin twice in the past for similar type pain This can be considered but await labs

## 2016-05-01 ENCOUNTER — Other Ambulatory Visit (INDEPENDENT_AMBULATORY_CARE_PROVIDER_SITE_OTHER): Payer: Medicare Other

## 2016-05-01 DIAGNOSIS — R1084 Generalized abdominal pain: Secondary | ICD-10-CM

## 2016-05-01 LAB — CBC WITH DIFFERENTIAL/PLATELET
BASOS PCT: 0.4 % (ref 0.0–3.0)
Basophils Absolute: 0 10*3/uL (ref 0.0–0.1)
EOS PCT: 1.9 % (ref 0.0–5.0)
Eosinophils Absolute: 0.2 10*3/uL (ref 0.0–0.7)
HCT: 46.2 % (ref 39.0–52.0)
HEMOGLOBIN: 15.8 g/dL (ref 13.0–17.0)
LYMPHS ABS: 2.2 10*3/uL (ref 0.7–4.0)
Lymphocytes Relative: 23.4 % (ref 12.0–46.0)
MCHC: 34.2 g/dL (ref 30.0–36.0)
MCV: 86.9 fl (ref 78.0–100.0)
MONO ABS: 1 10*3/uL (ref 0.1–1.0)
MONOS PCT: 10.5 % (ref 3.0–12.0)
NEUTROS PCT: 63.8 % (ref 43.0–77.0)
Neutro Abs: 5.9 10*3/uL (ref 1.4–7.7)
Platelets: 239 10*3/uL (ref 150.0–400.0)
RBC: 5.32 Mil/uL (ref 4.22–5.81)
RDW: 14 % (ref 11.5–15.5)
WBC: 9.3 10*3/uL (ref 4.0–10.5)

## 2016-05-01 LAB — LIPASE: LIPASE: 20 U/L (ref 11.0–59.0)

## 2016-05-01 LAB — COMPREHENSIVE METABOLIC PANEL
ALBUMIN: 4.3 g/dL (ref 3.5–5.2)
ALK PHOS: 69 U/L (ref 39–117)
ALT: 37 U/L (ref 0–53)
AST: 16 U/L (ref 0–37)
BUN: 20 mg/dL (ref 6–23)
CHLORIDE: 100 meq/L (ref 96–112)
CO2: 32 mEq/L (ref 19–32)
Calcium: 9.7 mg/dL (ref 8.4–10.5)
Creatinine, Ser: 1.13 mg/dL (ref 0.40–1.50)
GFR: 65.99 mL/min (ref >60.00)
Glucose, Bld: 95 mg/dL (ref 70–99)
POTASSIUM: 4.8 meq/L (ref 3.5–5.1)
Sodium: 138 mEq/L (ref 135–145)
TOTAL PROTEIN: 6.9 g/dL (ref 6.0–8.3)
Total Bilirubin: 0.7 mg/dL (ref 0.2–1.2)

## 2016-05-01 LAB — AMYLASE: AMYLASE: 56 U/L (ref 27–131)

## 2016-05-02 ENCOUNTER — Encounter: Payer: Self-pay | Admitting: Internal Medicine

## 2016-05-02 ENCOUNTER — Ambulatory Visit (INDEPENDENT_AMBULATORY_CARE_PROVIDER_SITE_OTHER): Payer: Medicare Other | Admitting: Internal Medicine

## 2016-05-02 VITALS — BP 124/50 | HR 52 | Ht 66.75 in | Wt 150.1 lb

## 2016-05-02 DIAGNOSIS — K5909 Other constipation: Secondary | ICD-10-CM | POA: Diagnosis not present

## 2016-05-02 DIAGNOSIS — R1013 Epigastric pain: Secondary | ICD-10-CM

## 2016-05-02 DIAGNOSIS — I6529 Occlusion and stenosis of unspecified carotid artery: Secondary | ICD-10-CM | POA: Diagnosis not present

## 2016-05-02 DIAGNOSIS — K5710 Diverticulosis of small intestine without perforation or abscess without bleeding: Secondary | ICD-10-CM

## 2016-05-02 NOTE — Patient Instructions (Signed)
Please continue current medications.  If you have recurrent abdominal pain please start a low residue diet and focus on fluids instead of food. Please call to  inform Dr. Hilarie Fredrickson of your progress and sx's.  Please follow up with Dr. Hilarie Fredrickson as needed.

## 2016-05-02 NOTE — Progress Notes (Signed)
Subjective:    Patient ID: Corey Fischer, male    DOB: 12-03-1931, 82 y.o.   MRN: [id number]  HPI Corey Fischer is an 82 year old male with a history of GERD, colon polyps, IBS, possible bacterial overgrowth, duodenal diverticulum, resolved small pancreatic cyst is here for follow-up. He was last seen in the office in July 2017. He reports that he has been doing very well though over the weekend, 5 days ago he developed mid somewhat diffuse and later more upper and central abdominal discomfort. This was similar to the pain that he had back in 2016. He had some mild nausea but not vomiting. The pain was crampy but also though and achy. Bowel movement have been regular though he had very loose stools on Monday of this week, 3 days ago. Otherwise stools of been regular. No blood in his stool or melena. His abdominal pain has now completely resolved. He came for labs earlier this week which were unremarkable. In the past he has been treated with rifaximin, 2 courses over time, which have improved his previous abdominal pain.  He does report that he had a flulike illness in late November but had resolved entirely by the time his abdominal pain started last weekend.  He also had a left hip steroid injection on 04/24/2016.  He has continued pantoprazole 40 mg daily. This is working well. He denies GERD, dysphagia and odynophagia. He is using MiraLAX on most days to avoid constipation.  He had an MRI pancreas performed in September to follow-up a small pancreatic cyst. This was reviewed today with the patient. It showed resolution of pancreatic cyst without overt pancreatic abnormality.  Review of Systems As per history of present illness, otherwise negative  Current Medications, Allergies, Past Medical History, Past Surgical History, Family History and Social History were reviewed in [employer] record.     Objective:   Physical Exam BP (!) 124/50 (BP Location: Left Arm, Patient  Position: Sitting, Cuff Size: Normal)   Pulse (!) 52   Ht 5' 6.75" (1.695 m)   Wt 150 lb 2 oz (68.1 kg)   BMI 23.69 kg/m  Constitutional: Well-developed and well-nourished. No distress. HEENT: Normocephalic and atraumatic. Oropharynx is clear and moist. No oropharyngeal exudate. Conjunctivae are normal.  No scleral icterus. Neck: Neck supple. Trachea midline. Cardiovascular: Normal rate, regular rhythm and intact distal pulses. No M/R/G Pulmonary/chest: Effort normal and breath sounds normal. No wheezing, rales or rhonchi. Abdominal: Soft, nontender, nondistended. Bowel sounds active throughout. There are no masses palpable. No hepatosplenomegaly. Extremities: no clubbing, cyanosis, or edema Lymphadenopathy: No cervical adenopathy noted. Neurological: Alert and oriented to person place and time. Skin: Skin is warm and dry. No rashes noted. Psychiatric: Normal mood and affect. Behavior is normal.  MRI ABDOMEN WITHOUT AND WITH CONTRAST (INCLUDING MRCP)   TECHNIQUE: Multiplanar multisequence MR imaging of the abdomen was performed both before and after the administration of intravenous contrast. Heavily T2-weighted images of the biliary and pancreatic ducts were obtained, and three-dimensional MRCP images were rendered by post processing.   CONTRAST:  67mL MULTIHANCE GADOBENATE DIMEGLUMINE 529 MG/ML IV SOLN   COMPARISON:  MRI of the abdomen 03/20/2015.   FINDINGS: Lower chest: Visualized portions are unremarkable.   Hepatobiliary: No cystic or solid hepatic lesions. Status post cholecystectomy. MRCP images demonstrate no intra or extrahepatic biliary ductal dilatation. Common bile duct measures up to 7 mm in the porta hepatis, which is within normal limits for the patient's age and post cholecystectomy  status. No filling defects within the common bile duct to suggest retained ductal stone.   Pancreas: Previously noted cystic lesion in the head of the pancreas is no longer  confidently identified on today's examination. No suspicious pancreatic lesions are noted. No pancreatic ductal dilatation on MRCP images. No pancreatic or peripancreatic fluid or inflammatory changes.   Spleen:  Unremarkable.   Adrenals/Urinary Tract: 9 mm exophytic lesion in the medial aspect of the upper pole of the left kidney is low T1 signal intensity, high T2 signal intensity, and does not enhance, compatible with a tiny simple cyst. Right kidney and bilateral adrenal glands are normal in appearance. No hydroureteronephrosis in the visualized abdomen.   Stomach/Bowel: Duodenal diverticulum extending off the posterior aspect of the second portion of the duodenum incidentally noted. Visualized portions of stomach, small bowel and colon are otherwise unremarkable.   Vascular/Lymphatic: Aortic atherosclerosis. No aneurysm identified in the visualized abdominal vasculature. Retro aortic left renal vein (normal anatomical variant) incidentally noted.   Other: No significant volume of ascites noted in the visualized portions of the peritoneal cavity.   Musculoskeletal: No aggressive osseous lesions are noted in the visualized portions of the skeleton.   IMPRESSION: 1. Previously noted tiny cystic lesion in the head of the pancreas has resolved, indicative of a benign lesion on the prior study, likely a tiny pancreatic pseudocyst. No future imaging followup is recommended. 2. Status post cholecystectomy. 3. Aortic atherosclerosis. 4. Additional incidental findings, as above.     Electronically Signed   By: Vinnie Langton M.D.   On: 02/17/2016 09:21  CBC    Component Value Date/Time   WBC 9.3 05/01/2016 0955   RBC 5.32 05/01/2016 0955   HGB 15.8 05/01/2016 0955   HGB 14.2 02/27/2010 1525   HCT 46.2 05/01/2016 0955   HCT 41.0 02/27/2010 1525   PLT 239.0 05/01/2016 0955   PLT 135 (L) 02/27/2010 1525   MCV 86.9 05/01/2016 0955   MCV 90.8 02/27/2010 1525   MCH 30.2  03/21/2015 1220   MCHC 34.2 05/01/2016 0955   RDW 14.0 05/01/2016 0955   RDW 13.5 02/27/2010 1525   LYMPHSABS 2.2 05/01/2016 0955   LYMPHSABS 1.7 02/27/2010 1525   MONOABS 1.0 05/01/2016 0955   MONOABS 0.5 02/27/2010 1525   EOSABS 0.2 05/01/2016 0955   EOSABS 0.1 02/27/2010 1525   BASOSABS 0.0 05/01/2016 0955   BASOSABS 0.0 02/27/2010 1525   CMP     Component Value Date/Time   NA 138 05/01/2016 0955   K 4.8 05/01/2016 0955   CL 100 05/01/2016 0955   CO2 32 05/01/2016 0955   GLUCOSE 95 05/01/2016 0955   BUN 20 05/01/2016 0955   CREATININE 1.13 05/01/2016 0955   CREATININE 1.04 08/15/2014 1033   CALCIUM 9.7 05/01/2016 0955   PROT 6.9 05/01/2016 0955   ALBUMIN 4.3 05/01/2016 0955   AST 16 05/01/2016 0955   ALT 37 05/01/2016 0955   ALKPHOS 69 05/01/2016 0955   BILITOT 0.7 05/01/2016 0955   GFRNONAA 59 (L) 03/22/2015 0403   GFRAA >60 03/22/2015 0403   Lipase     Component Value Date/Time   LIPASE 20.0 05/01/2016 0955       Assessment & Plan:  82 year old male with a history of GERD, colon polyps, IBS, possible bacterial overgrowth, duodenal diverticulum, resolved small pancreatic cyst is here for follow-up.   1. Mid abd pain/duodenal diverticulum -- His abdominal pain has resolved and he looks well. His labs are reviewed and very reassuring. I'm  suspicious that he may have some symptoms from time to time from this duodenal diverticulum. Perhaps this becomes inflamed and is painful. Duodenal diverticuli can also increase risk for bacterial overgrowth. I'm not convinced that he has had true duodenal diverticulitis lately. In the past he has responded to rifaximin for similar type pain which would help both bacterial overgrowth and diverticular inflammation. I recommended that he maintain a normal diet but if he does develop abdominal discomfort I recommended a low residue diet with focus on fluids/hydration.  If pain returns and is persistent consider retreatment with rifaximin.  He is happy with this plan  2. Chronic constipation -- continue MiraLAX 17 g daily which is working well for him.  3. History of pancreas cyst -- repeat imaging showed resolution of the cyst. No need for further surveillance for this issue  25 minutes spent with the patient today. Greater than 50% was spent in counseling and coordination of care with the patient Return as needed

## 2016-05-10 ENCOUNTER — Encounter: Payer: Self-pay | Admitting: Family Medicine

## 2016-05-10 ENCOUNTER — Telehealth: Payer: Self-pay | Admitting: Family Medicine

## 2016-05-10 ENCOUNTER — Other Ambulatory Visit: Payer: Self-pay | Admitting: Family Medicine

## 2016-05-10 ENCOUNTER — Ambulatory Visit (INDEPENDENT_AMBULATORY_CARE_PROVIDER_SITE_OTHER): Payer: Medicare Other | Admitting: Family Medicine

## 2016-05-10 VITALS — BP 110/58 | HR 58 | Temp 99.0°F | Wt 151.7 lb

## 2016-05-10 DIAGNOSIS — I6529 Occlusion and stenosis of unspecified carotid artery: Secondary | ICD-10-CM

## 2016-05-10 DIAGNOSIS — J069 Acute upper respiratory infection, unspecified: Secondary | ICD-10-CM

## 2016-05-10 DIAGNOSIS — J329 Chronic sinusitis, unspecified: Secondary | ICD-10-CM

## 2016-05-10 DIAGNOSIS — B9689 Other specified bacterial agents as the cause of diseases classified elsewhere: Secondary | ICD-10-CM

## 2016-05-10 MED ORDER — AZITHROMYCIN 250 MG PO TABS: ORAL_TABLET | ORAL | 0 refills | Status: DC

## 2016-05-10 MED ORDER — HYDROCODONE-HOMATROPINE 5-1.5 MG/5ML PO SYRP: 5.0000 mL | ORAL_SOLUTION | Freq: Three times a day (TID) | ORAL | 0 refills | Status: DC | PRN

## 2016-05-10 NOTE — Progress Notes (Signed)
Pre visit review using our clinic review tool, if applicable. No additional management support is needed unless otherwise documented below in the visit note. 

## 2016-05-10 NOTE — Progress Notes (Addendum)
PCP: Garret Reddish, MD  Subjective:  Corey Fischer is a 82 y.o. year old very pleasant male patient who presents with sinusitis symptoms including nasal congestion, sinus tenderness, cough, mild sore throat. Started Sunday after thanksgiving and also felt very fatigued and had skin sensitivity which has since resolved. He was probably 75% better but never resolved and then over last week symptoms have worsened again. Complains ofCough, tired, nasal discharge, sinus pressure.light yellow to clear and sometimes darker yellow -previous treatments: rest, hydration, tessalon given several weeks ago -sick contacts/travel/risks: denies flu exposure.  -Hx of: allergies on zyrtec  ROS-denies fever, SOB, NVD, tooth pain  Pertinent Past Medical History-  Patient Active Problem List   Diagnosis Date Noted  . Pancreatic cyst 03/28/2015    Priority: High  . Carotid artery stenosis 11/05/2007    Priority: High  . Hyperglycemia 07/06/2014    Priority: Medium  . Symptomatic PVCs     Priority: Medium  . Bradycardia 12/10/2012    Priority: Medium  . PAC (premature atrial contraction), symptomatic 10/16/2012    Priority: Medium  . Benign prostatic hypertrophy (BPH) with nocturia 07/22/2011    Priority: Medium  . THROMBOCYTHEMIA 04/21/2008    Priority: Medium  . Essential hypertension 04/29/2007    Priority: Medium  . Hyperlipidemia 01/22/2007    Priority: Medium  . Anxiety state, unspecified 12/30/2013    Priority: Low  . Osteoarthritis 03/16/2010    Priority: Low  . Osteopenia 07/06/2009    Priority: Low  . Raynaud's syndrome 06/02/2009    Priority: Low  . Prostatitis 03/01/2009    Priority: Low  . CONSTIPATION, CHRONIC 07/28/2008    Priority: Low  . ESOPHAGEAL STRICTURE 03/04/2008    Priority: Low  . COLONIC POLYPS, HX OF 03/04/2008    Priority: Low  . Unspecified visual loss 10/30/2007    Priority: Low  . Vitamin B12 deficiency 01/26/2007    Priority: Low  . GERD 01/22/2007   Priority: Low  . Generalized abdominal pain 10/18/2015  . Bloating 10/18/2015  . Schatzki's ring     Medications- reviewed  Current Outpatient Prescriptions  Medication Sig Dispense Refill  . amLODipine (NORVASC) 2.5 MG tablet Take 1 tablet (2.5 mg total) by mouth at bedtime. 90 tablet 3  . Ascorbic Acid (VITAMIN C) 1000 MG tablet Take 1,000 mg by mouth daily.     [name] aspirin 81 MG tablet Take 81 mg by mouth daily.    [name] atenolol (TENORMIN) 25 MG tablet Take 0.5 tablets (12.5 mg total) by mouth daily. 45 tablet 3  . atorvastatin (LIPITOR) 20 MG tablet TAKE 1 TABLET(20 MG) BY MOUTH DAILY 30 tablet 5  . cetirizine (ZYRTEC) 10 MG tablet Take 10 mg by mouth daily with breakfast.     . Cholecalciferol (VITAMIN D3) 2000 units capsule Take 2,000 Units by mouth daily.    . Coenzyme Q10 50 MG CAPS Take 250 mg by mouth daily.     . Cyanocobalamin 2500 MCG SUBL Place 2,500 mcg under the tongue daily.     . diazepam (VALIUM) 5 MG tablet Take 0.5-1 tablets (2.5-5 mg total) by mouth at bedtime as needed for anxiety (or sleep). 30 tablet 5  . hydrocortisone-pramoxine (ANALPRAM-HC) 2.5-1 % rectal cream Place 1 application rectally as needed.     . Magnesium 200 MG TABS Take 200 mg by mouth daily.     . ondansetron (ZOFRAN ODT) 4 MG disintegrating tablet Place 1 tablet on the tongue every 6 hours as needed for  nausea. 30 tablet 1  . pantoprazole (PROTONIX) 40 MG tablet Take 1 tablet (40 mg total) by mouth daily. 90 tablet 4  . polyethylene glycol (MIRALAX / GLYCOLAX) packet Take 17 g by mouth daily.     . promethazine (PHENERGAN) 25 MG suppository Place 25 mg rectally every 6 (six) hours as needed for nausea or vomiting.    . Zinc 25 MG TABS Take 25 mg by mouth daily.     [name] azithromycin (ZITHROMAX) 250 MG tablet Take 2 tabs on day 1, then 1 tab daily until finished 6 tablet 0  . HYDROcodone-homatropine (HYCODAN) 5-1.5 MG/5ML syrup Take 5 mLs by mouth every 8 (eight) hours as needed for cough. 120 mL 0   No  current facility-administered medications for this visit.     Objective: BP (!) 110/58 (BP Location: Left Arm, Patient Position: Sitting, Cuff Size: Normal)   Pulse (!) 58   Temp 99 F (37.2 C) (Oral)   Wt 151 lb 10.4 oz (68.8 kg)   SpO2 97%   BMI 23.93 kg/m  Gen: NAD, resting comfortably HEENT: Turbinates erythematous with yellow drainage, TM normal, pharynx mildly erythematous with no tonsilar exudate or edema, minimal sinus tenderness CV: RRR no murmurs rubs or gallops Lungs: CTAB no crackles, wheeze, rhonchi  Ext: no edema Skin: warm, dry, no rash Neuro: grossly normal, moves all extremities  Assessment/Plan:  Sinsusitis Bacterial based on: Symptoms >10 days, double sickening,  Treatment: -considered steroid: patient opted out -other symptomatic care with mucinex (also declines). Asks for tussionex generic which cpu will not pull up and sent in hycodan -Antibiotic indicated: yes (potential sinusitis but would also cover for walking pneumonia with now almost 4 weeks of cough)  Finally, we reviewed reasons to return to care including if symptoms worsen or persist or new concerns arise (particularly fever or shortness of breath)  Given chronicity of symptoms- we discussed x-ray if symptoms persist into January (this could be viral bronchitis as well which we discussed)  No driving 8 hours after hycodan  Meds ordered this encounter  Medications  . azithromycin (ZITHROMAX) 250 MG tablet    Sig: Take 2 tabs on day 1, then 1 tab daily until finished    Dispense:  6 tablet    Refill:  0  . HYDROcodone-homatropine (HYCODAN) 5-1.5 MG/5ML syrup    Sig: Take 5 mLs by mouth every 8 (eight) hours as needed for cough.    Dispense:  120 mL    Refill:  0   Garret Reddish, MD

## 2016-05-10 NOTE — Telephone Encounter (Signed)
im having a hard time connecting his visit to Dr. Martinique with the request below. For controlled substance without prior conversation- generally advise office visit. Would be willing to work him in like 4 45 or so

## 2016-05-10 NOTE — Patient Instructions (Signed)
Azithromycin will cover 80% of sinusitis and also would cover walking pneumonia- we opted to cover this. Possibly just a viral bronchitis but this would also provide some coverage for potential bacterial bronchitis (about 1 in 40)  Hycodan for cough given as well

## 2016-05-10 NOTE — Telephone Encounter (Signed)
° ° ° ° °  Pt call to ask if you RX him the following med   HYDROCODONE COUGH SYRUP.  Michela Pitcher he saw Dr Martinique a couple of weeks ago

## 2016-05-10 NOTE — Telephone Encounter (Signed)
Called and spoke with pt informing pt of Dr. Ronney Lion recommendations. Pt verbalized understanding and agreed to be seen today at 4:45.

## 2016-05-10 NOTE — Telephone Encounter (Signed)
Please see message and advise 

## 2016-05-22 ENCOUNTER — Encounter (INDEPENDENT_AMBULATORY_CARE_PROVIDER_SITE_OTHER): Payer: Self-pay | Admitting: Orthopaedic Surgery

## 2016-05-22 ENCOUNTER — Ambulatory Visit (INDEPENDENT_AMBULATORY_CARE_PROVIDER_SITE_OTHER): Payer: Medicare Other | Admitting: Orthopaedic Surgery

## 2016-05-22 DIAGNOSIS — M25551 Pain in right hip: Secondary | ICD-10-CM

## 2016-05-22 NOTE — Progress Notes (Signed)
The patient is following up with chronic left hip pain. This is a hip is been evaluated multiple 3 years out from a hip replacement that was done by another Psychologist, sport and exercise in town. Nothing is really helped in terms of calming down his pain. He seems to be more trochanteric related. He is 83 years old and it's more complain of weakness than anything. He's had MRIs that did not show any significance other than some muscle atrophy we tried therapy activity modification and steroid injections.  His get excellent full range of motion of his left hip with actually pretty good strength. His pain is over the trochanteric and IT band and sometimes deep in the thigh. Otherwise his exam is unchanged.  I am at a loss of what to recommend for him at this standpoint. He can stop his treadmill and get back to elliptical, biking. He'll follow up as needed.

## 2016-06-03 ENCOUNTER — Other Ambulatory Visit: Payer: Self-pay | Admitting: Cardiovascular Disease

## 2016-06-12 ENCOUNTER — Encounter: Payer: Self-pay | Admitting: Cardiovascular Disease

## 2016-06-12 ENCOUNTER — Ambulatory Visit (INDEPENDENT_AMBULATORY_CARE_PROVIDER_SITE_OTHER): Payer: Medicare Other | Admitting: Cardiovascular Disease

## 2016-06-12 VITALS — BP 112/50 | HR 53 | Ht 67.75 in | Wt 152.0 lb

## 2016-06-12 DIAGNOSIS — I10 Essential (primary) hypertension: Secondary | ICD-10-CM

## 2016-06-12 DIAGNOSIS — I6523 Occlusion and stenosis of bilateral carotid arteries: Secondary | ICD-10-CM

## 2016-06-12 DIAGNOSIS — E7800 Pure hypercholesterolemia, unspecified: Secondary | ICD-10-CM

## 2016-06-12 NOTE — Assessment & Plan Note (Signed)
History of bilateral carotid artery stenosis right greater than left duplex ultrasound last July. We will continue to follow this on an annual basis recently.

## 2016-06-12 NOTE — Assessment & Plan Note (Signed)
History of hypertension on amlodipine and atenolol blood pressure measured 110/58. Continue current meds at current dose

## 2016-06-12 NOTE — Patient Instructions (Signed)
Medication Instructions: Your physician recommends that you continue on your current medications as directed. Please refer to the Current Medication list given to you today.   Testing/Procedures: Schedule Carotid Doppler for July 2018  Follow-Up: Your physician wants you to follow-up in: 6 months with Dr. Gwenlyn Found. You will receive a reminder letter in the mail two months in advance. If you don't receive a letter, please call our office to schedule the follow-up appointment.  If you need a refill on your cardiac medications before your next appointment, please call your pharmacy.

## 2016-06-12 NOTE — Assessment & Plan Note (Signed)
History of hyperlipidemia on statin therapy with recent lipid profile performed 01/07/16 with a total cholesterol 118, LDL 46 patient 47.

## 2016-06-12 NOTE — Progress Notes (Signed)
06/12/2016 Corey Fischer   [birth year]  [id number]  Primary Physician Garret Reddish, MD Primary Cardiologist: [name] MD [name]  HPI:  The patient is a delightful 83 year old thin and fit appearing married Caucasian male father of 2, grandfather to 4 grandchildren who was self-referred for evaluation of symptomatic PVCs.I last saw him in the office 10/04/15 .  He is retired from working at [employer] 7 years ago. His risk factors are remarkable for treated hypertension and hyperlipidemia. He does not smoke and drinks socially. His family history is remarkable for a mother who had atrial fibrillation and a father who had angina, but no one has had documented ischemic heart disease. He has never had an MI or a stroke and denies chest pain or shortness of breath. His surgical history is remarkable for remote TURP, cholecystectomy, and hernia repair.  He has had PVCs since graduate school and was on propranolol remotely, prescribed by Dr. Coralie Keens. Over the last 2 months he has had progressive PVCs which he is more aware of and symptomatic from. He has cut out his caffeine. Lab work performed by Dr. Arnoldo Morale revealed a normal TSH.  He saw [name] registered nurse practitioner back at the end of May 2014 because of palpitations have gotten worse. He was started on Cardizem 30 mg by mouth twice a day and his simvastatin was decreased from 40-20 mg a day. The 2-D echo is essentially normal. He has been checking his blood pressures which are somewhat sporadic but averaging 1:30 to 389 range systolic.  We have stopped his diltiazem which would cause contributing to his Wenkebach and symptoms and changed him to amlodipine. He had no further symptoms. He denies chest pain or shortness of breath. His lipid profile was excellent on statin therapy last measured 05/07/13.  He saw [name] in our [facility] office 03/07/14 because of several days of symptom of  palpitations. His EKG showed PACs. He was initially begun on metoprolol and later switched to low-dose atenolol resulted in improvement in his symptoms.  Since being seen back in 6 months ago he's remained currently stable. He gets occasional episodes of symptomatic PVCs which are self-limited. He denies chest pain or shortness of breath. He does have moderate bilateral internal carotid artery stenosis which we followed by duplex ultrasound. He has been seen in the emergency room several times for abdominal pain of unclear etiology. He did have an abdominal pelvic CTA which parenthetically showed severe aortoiliac atherosclerotic changes although he has 2+ pedal pulses and denies claudication.  Since I saw him 6 months ago he denies chest pain or shortness of breath. He did have an upper respiratory tract infection which took several weeks to resolve.   Current Outpatient Prescriptions  Medication Sig Dispense Refill  . amLODipine (NORVASC) 2.5 MG tablet Take 1 tablet (2.5 mg total) by mouth at bedtime. 90 tablet 3  . Ascorbic Acid (VITAMIN C) 1000 MG tablet Take 1,000 mg by mouth daily.     [name] aspirin 81 MG tablet Take 81 mg by mouth daily.    [name] atenolol (TENORMIN) 25 MG tablet TAKE 1/2 TABLET BY MOUTH ONCE DAILY 45 tablet 0  . atorvastatin (LIPITOR) 20 MG tablet TAKE 1 TABLET(20 MG) BY MOUTH DAILY 30 tablet 5  . cetirizine (ZYRTEC) 10 MG tablet Take 10 mg by mouth daily with breakfast.     . Cholecalciferol (VITAMIN D3) 2000 units capsule Take 2,000 Units by mouth daily.    [name]  Coenzyme Q10 50 MG CAPS Take 250 mg by mouth daily.     . Cyanocobalamin 2500 MCG SUBL Place 2,500 mcg under the tongue daily.     . diazepam (VALIUM) 5 MG tablet Take 0.5-1 tablets (2.5-5 mg total) by mouth at bedtime as needed for anxiety (or sleep). 30 tablet 5  . hydrocortisone-pramoxine (ANALPRAM-HC) 2.5-1 % rectal cream Place 1 application rectally as needed.     . Magnesium 200 MG TABS Take 200 mg by mouth daily.      . ondansetron (ZOFRAN ODT) 4 MG disintegrating tablet Place 1 tablet on the tongue every 6 hours as needed for nausea. 30 tablet 1  . pantoprazole (PROTONIX) 40 MG tablet Take 1 tablet (40 mg total) by mouth daily. 90 tablet 4  . polyethylene glycol (MIRALAX / GLYCOLAX) packet Take 17 g by mouth daily.     . Zinc 25 MG TABS Take 25 mg by mouth daily.      No current facility-administered medications for this visit.     Allergies  Allergen Reactions  . Ciprofloxacin Other (See Comments)    Unknown reaction    Social History   Social History  . Marital status: Married    Spouse name: N/A  . Number of children: 2  . Years of education: N/A   Occupational History  . retired Retired   Social History Main Topics  . Smoking status: Never Smoker  . Smokeless tobacco: Never Used  . Alcohol use Yes     Comment: occasionally  . Drug use: No  . Sexual activity: Yes   Other Topics Concern  . Not on file   Social History Narrative   Grew up in small town in [place]. 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 Met wife in [place]. At 59, got married and went to college-undergrad in [employer] as well as Oceanographer, doctorate in Location manager.  Worked at [employer] which eventually became [employer]. Lived in [place] [facility] [place] and [place] before eventually moving to [place]. 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       Married over 50 years. 60 years 01/2014. 2 married children with 2 children each. 1 son near [place]. 1 son in [place] with house of rep.       Hobbies: work out most mornings 5 days a week (spin, elipitical), racquetball until 2014 after hip surgery.      Review of Systems: General: negative for chills, fever, night sweats or weight changes.  Cardiovascular: negative for chest pain, dyspnea on exertion, edema, orthopnea, palpitations, paroxysmal nocturnal dyspnea or shortness of breath Dermatological: negative for rash Respiratory: negative for cough  or wheezing Urologic: negative for hematuria Abdominal: negative for nausea, vomiting, diarrhea, bright red blood per rectum, melena, or hematemesis Neurologic: negative for visual changes, syncope, or dizziness All other systems reviewed and are otherwise negative except as noted above.    Blood pressure (!) 112/50, pulse (!) 53, height 5' 7.75" (1.721 m), weight 152 lb (68.9 kg).  General appearance: alert and no distress Neck: no adenopathy, no JVD, supple, symmetrical, trachea midline, thyroid not enlarged, symmetric, no tenderness/mass/nodules and Soft right carotid bruit Lungs: clear to auscultation bilaterally Heart: regular rate and rhythm, S1, S2 normal, no murmur, click, rub or gallop Extremities: extremities normal, atraumatic, no cyanosis or edema  EKG sinus bradycardia at 53 with left anterior fascicular block. I personally reviewed this EKG.  ASSESSMENT AND PLAN:   Hyperlipidemia History of hyperlipidemia on  statin therapy with recent lipid profile performed 01/07/16 with a total cholesterol 118, LDL 46 patient 47.  Essential hypertension History of hypertension on amlodipine and atenolol blood pressure measured 110/58. Continue current meds at current dose  Carotid artery stenosis History of bilateral carotid artery stenosis right greater than left duplex ultrasound last July. We will continue to follow this on an annual basis recently.      [name] MD FACP,FACC,FAHA, [facility] 06/12/2016 2:54 PM

## 2016-06-17 ENCOUNTER — Telehealth: Payer: Self-pay | Admitting: Family Medicine

## 2016-06-17 NOTE — Telephone Encounter (Addendum)
Please change preferred pharmacy to [facility] [address]. [place]

## 2016-06-24 ENCOUNTER — Other Ambulatory Visit: Payer: Self-pay | Admitting: Internal Medicine

## 2016-06-25 NOTE — Telephone Encounter (Signed)
updated

## 2016-07-01 ENCOUNTER — Other Ambulatory Visit: Payer: Self-pay | Admitting: Internal Medicine

## 2016-07-01 MED ORDER — PANTOPRAZOLE SODIUM 40 MG PO TBEC: 40.0000 mg | DELAYED_RELEASE_TABLET | Freq: Every day | ORAL | 0 refills | Status: DC

## 2016-07-29 ENCOUNTER — Ambulatory Visit: Payer: Medicare Other | Admitting: Podiatry

## 2016-07-30 DIAGNOSIS — Z96642 Presence of left artificial hip joint: Secondary | ICD-10-CM | POA: Diagnosis not present

## 2016-07-30 DIAGNOSIS — M25552 Pain in left hip: Secondary | ICD-10-CM | POA: Diagnosis not present

## 2016-07-31 ENCOUNTER — Other Ambulatory Visit: Payer: Self-pay

## 2016-07-31 MED ORDER — ATORVASTATIN CALCIUM 20 MG PO TABS: ORAL_TABLET | ORAL | 1 refills | Status: DC

## 2016-08-06 DIAGNOSIS — M7062 Trochanteric bursitis, left hip: Secondary | ICD-10-CM | POA: Diagnosis not present

## 2016-08-06 DIAGNOSIS — M1611 Unilateral primary osteoarthritis, right hip: Secondary | ICD-10-CM | POA: Diagnosis not present

## 2016-08-07 ENCOUNTER — Ambulatory Visit: Payer: Self-pay | Admitting: Podiatry

## 2016-08-07 ENCOUNTER — Encounter: Payer: Self-pay | Admitting: Podiatry

## 2016-08-07 VITALS — BP 151/64 | HR 55 | Resp 18

## 2016-08-07 DIAGNOSIS — B351 Tinea unguium: Secondary | ICD-10-CM | POA: Diagnosis not present

## 2016-08-07 DIAGNOSIS — R52 Pain, unspecified: Secondary | ICD-10-CM

## 2016-08-07 NOTE — Progress Notes (Signed)
   Subjective:    Patient ID: Corey Fischer, male    DOB: 1932-02-22, 83 y.o.   MRN: [id number]  HPI  83 year old male presents the office today requesting laser therapy for his toenails. He states these are chronic thickening, discoloration to his toenails for several years. He has tried over-the-counter remedies for toenail fungus but has never been evaluated for this. Denies any pain or swelling or any redness the toenails. He said no other treatment for this. He has no other complaints today.  Review of Systems  All other systems reviewed and are negative.      Objective:   Physical Exam General: AAO x3, NAD  Dermatological: Nails are hypertrophic, dystrophic, brittle, discolored, elongated 10. No surrounding redness or drainage.  No open lesions or pre-ulcerative lesions are identified today. No significant incurvation of the nails today.   Vascular: Dorsalis Pedis artery and Posterior Tibial artery pedal pulses are 2/4 bilateral with immedate capillary fill time. There is no pain with calf compression, swelling, warmth, erythema.   Neruologic: Grossly intact via light touch bilateral. Vibratory intact via tuning fork bilateral. Protective threshold with Semmes Wienstein monofilament intact to all pedal sites bilateral.   Musculoskeletal: No gross boney pedal deformities bilateral. No pain, crepitus, or limitation noted with foot and ankle range of motion bilateral. Muscular strength 5/5 in all groups tested bilateral.  Gait: Unassisted, Nonantalgic.      Assessment & Plan:  83 year old male onychomycosis chronic -Treatment options discussed including all alternatives, risks, and complications -Etiology of symptoms were discussed -At this time a discussed treatment options for onychomycosis I discussed biopsy having wishes to hold off on the biopsy. He wishes to have pursued laser for the toenails. I discussed this is not a guarantee of resolution of symptoms and he understands this  and wishes to proceed with this. Today he completed his first treatment of laser after the nails were debrided without complications or bleeding. -Laserering of Toenails was carried out at today's visit via Q-switch YAG laser by [employer] at continuous on the 10 digit toenails.  Patient and staff were wearing appropriate laser protective goggles/eyewear Laser device was tested prior to use and safety protocols were followed Frequency of 5 Hz, Level 4, Joules 1.3 delivered. The patient tolerated the lasering well without any complications. They were encouraged to call the office with any questions, concerns, change in symptoms.  Celesta Gentile, DPM

## 2016-08-15 DIAGNOSIS — M1611 Unilateral primary osteoarthritis, right hip: Secondary | ICD-10-CM | POA: Diagnosis not present

## 2016-08-15 DIAGNOSIS — M7062 Trochanteric bursitis, left hip: Secondary | ICD-10-CM | POA: Diagnosis not present

## 2016-08-21 DIAGNOSIS — M7062 Trochanteric bursitis, left hip: Secondary | ICD-10-CM | POA: Diagnosis not present

## 2016-08-21 DIAGNOSIS — M1611 Unilateral primary osteoarthritis, right hip: Secondary | ICD-10-CM | POA: Diagnosis not present

## 2016-08-27 DIAGNOSIS — M7062 Trochanteric bursitis, left hip: Secondary | ICD-10-CM | POA: Diagnosis not present

## 2016-08-27 DIAGNOSIS — M1611 Unilateral primary osteoarthritis, right hip: Secondary | ICD-10-CM | POA: Diagnosis not present

## 2016-09-02 ENCOUNTER — Other Ambulatory Visit: Payer: Self-pay | Admitting: Cardiovascular Disease

## 2016-09-04 ENCOUNTER — Ambulatory Visit (INDEPENDENT_AMBULATORY_CARE_PROVIDER_SITE_OTHER): Payer: Medicare Other | Admitting: Family Medicine

## 2016-09-04 ENCOUNTER — Encounter: Payer: Self-pay | Admitting: Family Medicine

## 2016-09-04 VITALS — BP 130/70 | HR 49 | Temp 98.0°F | Wt 156.1 lb

## 2016-09-04 DIAGNOSIS — J302 Other seasonal allergic rhinitis: Secondary | ICD-10-CM | POA: Diagnosis not present

## 2016-09-04 DIAGNOSIS — I6523 Occlusion and stenosis of bilateral carotid arteries: Secondary | ICD-10-CM | POA: Diagnosis not present

## 2016-09-04 NOTE — Progress Notes (Signed)
Subjective:     Patient ID: Corey Fischer, male   DOB: 10/12/1931, 83 y.o.   MRN: [id number]  HPI Patient seen with one-week history of what he describes as "head pressure". He states his symptoms have mostly resolved since yesterday. He relates around the middle of last week noticing some pressure in his sinus region with radiation occipitally. He denies any headache. No head injury. Does have some mostly clear nasal secretions. He thinks some of this may be allergy related. Takes Zyrtec daily. He's had some daily bilateral nasal congestion. No nosebleeds. No fevers or chills.  Denies any focal neuro symptoms such as blurred vision, weakness, or speech changes. No exertional symptoms.  Past Medical History:  Diagnosis Date  . Abdominal pain 03/19/2015  . Allergy   . Anxiety   . Arthritis   . Asymptomatic bilateral carotid artery stenosis    a. Last duplex 11/2013 R bulb and ICA - 50-69%, R ECA >50%. L bulb and prox ICA: 0-49%. Repeat due 11/2014.  [name] B12 deficiency   . Bilateral carotid artery disease ([facility])   . BPH (benign prostatic hyperplasia)   . Chronic ITP (idiopathic thrombocytopenia) ([facility])   . Depression   . Diverticulosis   . Elevated PSA   . Gastric polyp   . Gastritis   . GERD (gastroesophageal reflux disease)   . H/O hiatal hernia   . Hemorrhoids   . History of hiatal hernia   . Hx of adenomatous colonic polyps 2010  . Hyperlipidemia   . Hypertension   . INTERNAL HEMORRHOIDS WITHOUT MENTION COMP 08/14/2007   Qualifier: Diagnosis of  By: Arnoldo Morale MD, John E   . Osteoarthritis hips  . PAC (premature atrial contraction)    a. NSR/SB with 1st degree AVB and PACs/atrial bigeminy seen on event monitor 11/2012.  [name] Palpitations   . Raynaud's syndrome   . Sinus bradycardia   . Stricture and stenosis of esophagus 2010   last endoscopy- 2010  . Symptomatic PVCs    a. Per report, monitor 05/2012 - NSR with symptomatic PVC. b. Echo 05/2012- EF 55-60%, aortic sclerosis without stenosis.  c. Nuc 12/2012 - normal.  . Wandering (atrial) pacemaker    Past Surgical History:  Procedure Laterality Date  . APPENDECTOMY  age 15  . CARDIAC CATHETERIZATION  1989   Told that he is "pristine "  . CAROTID DUPLEX Bilateral 05-26-2007  . CHOLECYSTECTOMY  1989  . COLONOSCOPY WITH PROPOFOL N/A 03/22/2015   Procedure: COLONOSCOPY WITH PROPOFOL;  Surgeon: Jerene Bears, MD;  Location: [facility];  Service: Endoscopy;  Laterality: N/A;  . ESOPHAGOGASTRODUODENOSCOPY (EGD) WITH PROPOFOL N/A 03/22/2015   Procedure: ESOPHAGOGASTRODUODENOSCOPY (EGD) WITH PROPOFOL;  Surgeon: Jerene Bears, MD;  Location: [facility];  Service: Endoscopy;  Laterality: N/A;  . LAPAROSCOPIC INGUINAL HERNIA REPAIR Bilateral 09-19-2010  . LEFT SHOULDER SURG.  2006  . ROTATOR CUFF REPAIR Right 1987  . TONSILLECTOMY  age 42  . TOTAL HIP ARTHROPLASTY Left 05/31/2013   Procedure: TOTAL HIP ARTHROPLASTY;  Surgeon: Kerin Salen, MD;  Location: [facility];  Service: Orthopedics;  Laterality: Left;  . TRANSURETHRAL RESECTION OF PROSTATE  07/22/2011   Procedure: TRANSURETHRAL RESECTION OF THE PROSTATE WITH GYRUS INSTRUMENTS;  Surgeon: Bernestine Amass, MD;  [facility];  Service: Urology;  Laterality: N/A;  Saline Gyrus TURP     reports that he has never smoked. He has never used smokeless tobacco. He reports that he drinks alcohol. He reports that he  does not use drugs. family history includes Angina in his father; Cancer in his maternal grandmother; Colon polyps in his mother; Heart disease in his father and mother; Heart failure in his maternal grandfather. Allergies  Allergen Reactions  . Ciprofloxacin Other (See Comments)    Unknown reaction     Review of Systems  Constitutional: Negative for chills and fever.  HENT: Positive for congestion and sinus pressure. Negative for ear discharge, ear pain and postnasal drip.   Respiratory: Negative for cough, shortness of breath and wheezing.   Cardiovascular:  Negative for chest pain.  Neurological: Negative for dizziness, seizures, syncope, speech difficulty, weakness, light-headedness and headaches.  Psychiatric/Behavioral: Negative for confusion.       Objective:   Physical Exam  Constitutional: He is oriented to person, place, and time. He appears well-developed and well-nourished.  HENT:  Right Ear: External ear normal.  Left Ear: External ear normal.  Mouth/Throat: Oropharynx is clear and moist.  Neck: Neck supple.  Cardiovascular: Normal rate and regular rhythm.   Pulmonary/Chest: Effort normal and breath sounds normal. No respiratory distress. He has no wheezes. He has no rales.  Lymphadenopathy:    He has no cervical adenopathy.  Neurological: He is alert and oriented to person, place, and time. No cranial nerve deficit. Coordination normal.  No focal weakness. Cerebellar function normal. Gait normal       Assessment:     Patient presents with onset last week of some general sensation of "pressure" mostly sinus region but radiation occipitally. No headache. Doubt acute infection.  Suspect allergy related    Plan:     -Add Flonase or Nasacort to his Zyrtec -Avoid decongestants with his history of BPH -Follow-up immediately for any headache or other new symptom  Eulas Post MD [facility]

## 2016-09-04 NOTE — Patient Instructions (Signed)
Try OTC Flonase or Nasacort May continue with the Zyrtec or consider Xyzal.   Follow up for any headaches or new symptoms.

## 2016-09-04 NOTE — Progress Notes (Signed)
Pre visit review using our clinic review tool, if applicable. No additional management support is needed unless otherwise documented below in the visit note. 

## 2016-09-05 DIAGNOSIS — H40013 Open angle with borderline findings, low risk, bilateral: Secondary | ICD-10-CM | POA: Diagnosis not present

## 2016-09-05 DIAGNOSIS — H01003 Unspecified blepharitis right eye, unspecified eyelid: Secondary | ICD-10-CM | POA: Diagnosis not present

## 2016-09-05 DIAGNOSIS — H04123 Dry eye syndrome of bilateral lacrimal glands: Secondary | ICD-10-CM | POA: Diagnosis not present

## 2016-09-06 ENCOUNTER — Telehealth: Payer: Self-pay | Admitting: Family Medicine

## 2016-09-06 NOTE — Telephone Encounter (Signed)
Spoke with patient and he is "feeling a little better this afternoon". He will call back and schedule an appointment as needed.

## 2016-09-06 NOTE — Telephone Encounter (Signed)
Pt saw Dr. Elease Hashimoto on 4/18 and the antibiotic is not working and would like to know if Dr. Elease Hashimoto thinks that he has given it enough time or should he come back in to be seen.

## 2016-09-06 NOTE — Telephone Encounter (Signed)
We did not give him an antibiotic.  We recommended Flonase or Nasacort.  If he started those and no better, needs to follow up with Dr Yong Channel.

## 2016-09-10 ENCOUNTER — Ambulatory Visit: Payer: Self-pay

## 2016-09-10 DIAGNOSIS — B351 Tinea unguium: Secondary | ICD-10-CM

## 2016-09-10 DIAGNOSIS — R52 Pain, unspecified: Secondary | ICD-10-CM

## 2016-09-11 ENCOUNTER — Encounter: Payer: Self-pay | Admitting: Family Medicine

## 2016-09-13 NOTE — Progress Notes (Signed)
Pt presents with mycotic infection of nails 1-5 bilateral, noted clearing at 50%  All other systems are negative  Laser therapy administered to affected nails and tolerated well. All safety precautions were in place. Re-appointed in 4 weeks for 3rd treatment

## 2016-09-16 DIAGNOSIS — R351 Nocturia: Secondary | ICD-10-CM | POA: Diagnosis not present

## 2016-09-16 DIAGNOSIS — N138 Other obstructive and reflux uropathy: Secondary | ICD-10-CM | POA: Diagnosis not present

## 2016-09-16 DIAGNOSIS — R3914 Feeling of incomplete bladder emptying: Secondary | ICD-10-CM | POA: Diagnosis not present

## 2016-09-16 DIAGNOSIS — N401 Enlarged prostate with lower urinary tract symptoms: Secondary | ICD-10-CM | POA: Diagnosis not present

## 2016-09-17 DIAGNOSIS — M1611 Unilateral primary osteoarthritis, right hip: Secondary | ICD-10-CM | POA: Diagnosis not present

## 2016-09-17 DIAGNOSIS — M7062 Trochanteric bursitis, left hip: Secondary | ICD-10-CM | POA: Diagnosis not present

## 2016-09-19 DIAGNOSIS — R351 Nocturia: Secondary | ICD-10-CM | POA: Diagnosis not present

## 2016-09-19 DIAGNOSIS — R310 Gross hematuria: Secondary | ICD-10-CM | POA: Diagnosis not present

## 2016-09-19 DIAGNOSIS — R3914 Feeling of incomplete bladder emptying: Secondary | ICD-10-CM | POA: Diagnosis not present

## 2016-09-19 DIAGNOSIS — N138 Other obstructive and reflux uropathy: Secondary | ICD-10-CM | POA: Diagnosis not present

## 2016-09-19 DIAGNOSIS — N401 Enlarged prostate with lower urinary tract symptoms: Secondary | ICD-10-CM | POA: Diagnosis not present

## 2016-09-26 ENCOUNTER — Other Ambulatory Visit: Payer: Self-pay | Admitting: *Deleted

## 2016-09-26 MED ORDER — PANTOPRAZOLE SODIUM 40 MG PO TBEC: 40.0000 mg | DELAYED_RELEASE_TABLET | Freq: Every day | ORAL | 1 refills | Status: DC

## 2016-09-27 DIAGNOSIS — N200 Calculus of kidney: Secondary | ICD-10-CM | POA: Diagnosis not present

## 2016-09-27 DIAGNOSIS — R310 Gross hematuria: Secondary | ICD-10-CM | POA: Diagnosis not present

## 2016-10-02 DIAGNOSIS — L821 Other seborrheic keratosis: Secondary | ICD-10-CM | POA: Diagnosis not present

## 2016-10-02 DIAGNOSIS — L739 Follicular disorder, unspecified: Secondary | ICD-10-CM | POA: Diagnosis not present

## 2016-10-02 DIAGNOSIS — L570 Actinic keratosis: Secondary | ICD-10-CM | POA: Diagnosis not present

## 2016-10-03 DIAGNOSIS — M7062 Trochanteric bursitis, left hip: Secondary | ICD-10-CM | POA: Diagnosis not present

## 2016-10-03 DIAGNOSIS — M1611 Unilateral primary osteoarthritis, right hip: Secondary | ICD-10-CM | POA: Diagnosis not present

## 2016-10-03 DIAGNOSIS — N400 Enlarged prostate without lower urinary tract symptoms: Secondary | ICD-10-CM | POA: Diagnosis not present

## 2016-10-03 DIAGNOSIS — R310 Gross hematuria: Secondary | ICD-10-CM | POA: Diagnosis not present

## 2016-10-03 DIAGNOSIS — N200 Calculus of kidney: Secondary | ICD-10-CM | POA: Diagnosis not present

## 2016-10-07 ENCOUNTER — Ambulatory Visit: Payer: Medicare Other

## 2016-10-07 DIAGNOSIS — M79673 Pain in unspecified foot: Secondary | ICD-10-CM

## 2016-10-07 DIAGNOSIS — B351 Tinea unguium: Secondary | ICD-10-CM

## 2016-10-08 ENCOUNTER — Telehealth: Payer: Self-pay | Admitting: Family Medicine

## 2016-10-08 NOTE — Telephone Encounter (Addendum)
Pt is turning up tv loud and having hearing issue. Pt would like to see dr Thornell Mule [phone] for audiology evaluation. Pt has medicare

## 2016-10-08 NOTE — Progress Notes (Signed)
Pt presents with mycotic infection of nails 1-5 bilateral, noted clearing at 50%  All other systems are negative  Laser therapy administered to affected nails and tolerated well. All safety precautions were in place. Re-appointed in 4 weeks for 4th treatment

## 2016-10-10 ENCOUNTER — Other Ambulatory Visit: Payer: Self-pay

## 2016-10-10 DIAGNOSIS — H9190 Unspecified hearing loss, unspecified ear: Secondary | ICD-10-CM

## 2016-10-10 NOTE — Telephone Encounter (Signed)
Referral placed as requested.

## 2016-10-11 ENCOUNTER — Encounter: Payer: Self-pay | Admitting: Family Medicine

## 2016-11-11 ENCOUNTER — Ambulatory Visit: Payer: Medicare Other | Admitting: Sports Medicine

## 2016-11-11 DIAGNOSIS — B351 Tinea unguium: Secondary | ICD-10-CM

## 2016-11-11 DIAGNOSIS — M79673 Pain in unspecified foot: Secondary | ICD-10-CM

## 2016-11-15 ENCOUNTER — Telehealth: Payer: Self-pay | Admitting: Podiatry

## 2016-11-15 ENCOUNTER — Telehealth: Payer: Self-pay | Admitting: Cardiovascular Disease

## 2016-11-15 MED ORDER — NONFORMULARY OR COMPOUNDED ITEM: 2 refills | Status: DC

## 2016-11-15 NOTE — Telephone Encounter (Signed)
Patient called in regards to a prescription that was supposed to be ordered with [facility] on Tuesday 26 June. I was just wanting to see if it had been placed yet as I have not heard anything.

## 2016-11-15 NOTE — Telephone Encounter (Signed)
I think this is for the onychomycosis ointment through [facility]. Please order. Thanks.

## 2016-11-15 NOTE — Telephone Encounter (Signed)
Pt states that while at a gym class he had a pre-syncopal episode, he also states that he has bilateral bicep pain and sinus pain and pressure. Pt states earlier this week he has had orthostatic episodes and lightheaded upon changing positions intermittently but not everytime upon changing positions.Pt has carotid doppler later this month. BP is running 124/63 HR 47 today and 126/58 HR 48 yesterday but pt states that his HR runs low anyway. Pt states that he will call pcp and discuss sinus medications with him-he states that he thought that this might "be the problem" but was unsure.

## 2016-11-15 NOTE — Telephone Encounter (Signed)
°  New Prob  Pt states he has had ongoing lightheadedness and pain to arm bilaterally. Requesting to speak to nurse.

## 2016-11-15 NOTE — Telephone Encounter (Signed)
Left message informing pt the rx had been faxed to [facility].

## 2016-12-04 NOTE — Progress Notes (Signed)
Pt presents with mycotic infection of nails 1-5 bilateral, noted clearing at 50%  All other systems are negative  Laser therapy administered to affected nails and tolerated well. All safety precautions were in place. Re-appointed in 4 weeks for 5th treatment

## 2016-12-10 ENCOUNTER — Ambulatory Visit (INDEPENDENT_AMBULATORY_CARE_PROVIDER_SITE_OTHER): Payer: Medicare Other | Admitting: Cardiovascular Disease

## 2016-12-10 ENCOUNTER — Ambulatory Visit (HOSPITAL_COMMUNITY)
Admission: RE | Admit: 2016-12-10 | Discharge: 2016-12-10 | Disposition: A | Discharge status: Home / Self Care | Payer: Medicare Other | Source: Ambulatory Visit | Attending: Internal Medicine | Admitting: Internal Medicine

## 2016-12-10 ENCOUNTER — Encounter: Payer: Self-pay | Admitting: Cardiovascular Disease

## 2016-12-10 DIAGNOSIS — I6523 Occlusion and stenosis of bilateral carotid arteries: Secondary | ICD-10-CM | POA: Diagnosis not present

## 2016-12-10 DIAGNOSIS — E7800 Pure hypercholesterolemia, unspecified: Secondary | ICD-10-CM | POA: Diagnosis not present

## 2016-12-10 DIAGNOSIS — I10 Essential (primary) hypertension: Secondary | ICD-10-CM | POA: Diagnosis not present

## 2016-12-10 NOTE — Assessment & Plan Note (Signed)
History of carotid artery disease with Dopplers performed today that showed moderate right and mild to moderate left ICA stenosis and has remained stable over the last year.

## 2016-12-10 NOTE — Progress Notes (Signed)
12/10/2016 Corey Fischer   [birth year]  [id number]  Primary Physician Marin Olp, MD Primary Cardiologist: [name] MD [name]  HPI:  The patient is a delightful 83 year old thin and fit appearing married Caucasian male father of 2, grandfather to 4 grandchildren who was self-referred for evaluation of symptomatic PVCs.I last saw him in the office 06/12/16 .  He is retired from working at [employer] 7 years ago. His risk factors are remarkable for treated hypertension and hyperlipidemia. He does not smoke and drinks socially. His family history is remarkable for a mother who had atrial fibrillation and a father who had angina, but no one has had documented ischemic heart disease. He has never had an MI or a stroke and denies chest pain or shortness of breath. His surgical history is remarkable for remote TURP, cholecystectomy, and hernia repair.  He has had PVCs since graduate school and was on propranolol remotely, prescribed by Dr. Coralie Keens. Over the last 2 months he has had progressive PVCs which he is more aware of and symptomatic from. He has cut out his caffeine. Lab work performed by Dr. Arnoldo Morale revealed a normal TSH.  He saw [name] registered nurse practitioner back at the end of May 2014 because of palpitations have gotten worse. He was started on Cardizem 30 mg by mouth twice a day and his simvastatin was decreased from 40-20 mg a day. The 2-D echo is essentially normal. He has been checking his blood pressures which are somewhat sporadic but averaging 1:30 to 116 range systolic.  We have stopped his diltiazem which would cause contributing to his Wenkebach and symptoms and changed him to amlodipine. He had no further symptoms. He denies chest pain or shortness of breath. His lipid profile was excellent on statin therapy last measured 05/07/13.  He saw [name] in our [facility] office 03/07/14 because of several days of symptom of  palpitations. His EKG showed PACs. He was initially begun on metoprolol and later switched to low-dose atenolol resulted in improvement in his symptoms.  Since being seen back in 6 months ago he's remained currently stable. He gets occasional episodes of symptomatic PVCs which are self-limited. He denies chest pain or shortness of breath. He does have moderate bilateral internal carotid artery stenosis which we followed by duplex ultrasound. He has been seen in the emergency room several times for abdominal pain of unclear etiology. He did have an abdominal pelvic CTA which parenthetically showed severe aortoiliac atherosclerotic changes although he has 2+ pedal pulses and denies claudication.  Since I saw him 6 months ago he denies chest pain or shortness of breath. He exercises on a routine basis without limitation.  Current Outpatient Prescriptions  Medication Sig Dispense Refill  . amLODipine (NORVASC) 2.5 MG tablet Take 1 tablet (2.5 mg total) by mouth at bedtime. 90 tablet 3  . Ascorbic Acid (VITAMIN C) 1000 MG tablet Take 1,000 mg by mouth daily.     [name] aspirin 81 MG tablet Take 81 mg by mouth daily.    [name] atenolol (TENORMIN) 25 MG tablet TAKE 1/2 TABLET BY MOUTH ONCE DAILY 45 tablet 1  . atorvastatin (LIPITOR) 20 MG tablet TAKE 1 TABLET(20 MG) BY MOUTH DAILY 90 tablet 1  . cetirizine (ZYRTEC) 10 MG tablet Take 10 mg by mouth daily with breakfast.     . Cholecalciferol (VITAMIN D3) 2000 units capsule Take 2,000 Units by mouth daily.    . Coenzyme Q10  50 MG CAPS Take 250 mg by mouth daily.     . Cyanocobalamin 2500 MCG SUBL Place 2,500 mcg under the tongue daily.     . diazepam (VALIUM) 5 MG tablet Take 0.5-1 tablets (2.5-5 mg total) by mouth at bedtime as needed for anxiety (or sleep). 30 tablet 5  . hydrocortisone-pramoxine (ANALPRAM-HC) 2.5-1 % rectal cream Place 1 application rectally as needed.     . Magnesium 200 MG TABS Take 200 mg by mouth daily.     . NONFORMULARY OR COMPOUNDED  ITEM [facility]:  Onychomycosis nail lacquer. Apply 1-2 grams to affected area 3-4 times dails. 120 each 2  . ondansetron (ZOFRAN ODT) 4 MG disintegrating tablet Place 1 tablet on the tongue every 6 hours as needed for nausea. 30 tablet 1  . pantoprazole (PROTONIX) 40 MG tablet Take 1 tablet (40 mg total) by mouth daily. 90 tablet 1  . polyethylene glycol (MIRALAX / GLYCOLAX) packet Take 17 g by mouth daily.     . Zinc 25 MG TABS Take 25 mg by mouth daily.      No current facility-administered medications for this visit.     Allergies  Allergen Reactions  . Ciprofloxacin Other (See Comments)    Unknown reaction    Social History   Social History  . Marital status: Married    Spouse name: N/A  . Number of children: 2  . Years of education: N/A   Occupational History  . retired Retired   Social History Main Topics  . Smoking status: Never Smoker  . Smokeless tobacco: Never Used  . Alcohol use Yes     Comment: occasionally  . Drug use: No  . Sexual activity: Yes   Other Topics Concern  . Not on file   Social History Narrative   Grew up in [place] town in [place]. 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 Met wife in [place]. At 43, got married and went to college-undergrad in [employer] as well as Oceanographer, doctorate in Location manager.  Worked at [employer] which eventually became [employer]. Lived in [place] and [place] before eventually moving to [place]. 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       Married over 50 years. 102 years 01/2014. 2 married children with 2 children each. 1 son near [place]. 1 son in [place] with house of rep.       Hobbies: work out most mornings 5 days a week (spin, elipitical), racquetball until 2014 after hip surgery.      Review of Systems: General: negative for chills, fever, night sweats or weight changes.  Cardiovascular: negative for chest pain, dyspnea on exertion, edema, orthopnea, palpitations,  paroxysmal nocturnal dyspnea or shortness of breath Dermatological: negative for rash Respiratory: negative for cough or wheezing Urologic: negative for hematuria Abdominal: negative for nausea, vomiting, diarrhea, bright red blood per rectum, melena, or hematemesis Neurologic: negative for visual changes, syncope, or dizziness All other systems reviewed and are otherwise negative except as noted above.    Blood pressure (!) 128/54, pulse (!) 48, height 5' 7"  (1.702 m), weight 155 lb (70.3 kg).  General appearance: alert and no distress Neck: no adenopathy, no JVD, supple, symmetrical, trachea midline, thyroid not enlarged, symmetric, no tenderness/mass/nodules and Soft bilateral carotid bruits Lungs: clear to auscultation bilaterally Heart: regular rate and rhythm, S1, S2 normal, no murmur, click, rub or gallop Extremities: extremities normal, atraumatic, no cyanosis or edema  EKG not performed today  ASSESSMENT AND PLAN:   Hyperlipidemia History of hyperlipidemia on statin therapy followed by his PCP  Essential hypertension History of essential hypertension with blood pressures measured 120/54. He is on amlodipine, and atenolol. Continue current meds at current dosing  Carotid artery stenosis History of carotid artery disease with Dopplers performed today that showed moderate right and mild to moderate left ICA stenosis and has remained stable over the last year.      [name] MD FACP,FACC,FAHA, [facility] 12/10/2016 10:02 AM

## 2016-12-10 NOTE — Assessment & Plan Note (Signed)
History of hyperlipidemia on statin therapy followed by his PCP 

## 2016-12-10 NOTE — Patient Instructions (Signed)

## 2016-12-10 NOTE — Assessment & Plan Note (Signed)
History of essential hypertension with blood pressures measured 120/54. He is on amlodipine, and atenolol. Continue current meds at current dosing

## 2016-12-11 ENCOUNTER — Other Ambulatory Visit: Payer: Self-pay | Admitting: Cardiovascular Disease

## 2016-12-11 DIAGNOSIS — I6523 Occlusion and stenosis of bilateral carotid arteries: Secondary | ICD-10-CM

## 2016-12-12 ENCOUNTER — Ambulatory Visit: Payer: Medicare Other

## 2016-12-30 ENCOUNTER — Ambulatory Visit: Payer: Medicare Other

## 2016-12-31 DIAGNOSIS — H903 Sensorineural hearing loss, bilateral: Secondary | ICD-10-CM | POA: Diagnosis not present

## 2017-01-06 ENCOUNTER — Ambulatory Visit (INDEPENDENT_AMBULATORY_CARE_PROVIDER_SITE_OTHER): Payer: Medicare Other | Admitting: Podiatry

## 2017-01-06 DIAGNOSIS — B351 Tinea unguium: Secondary | ICD-10-CM

## 2017-01-13 NOTE — Progress Notes (Signed)
Pt presents with mycotic infection of nails 1-5 bilateral, noted clearing at 50%  All other systems are negative  Laser therapy administered to affected nails and tolerated well. All safety precautions were in place. Re-appointed in 4 weeks for 6th treatment

## 2017-01-23 ENCOUNTER — Other Ambulatory Visit: Payer: Self-pay | Admitting: Family Medicine

## 2017-02-17 DIAGNOSIS — Z961 Presence of intraocular lens: Secondary | ICD-10-CM | POA: Diagnosis not present

## 2017-02-17 DIAGNOSIS — H43812 Vitreous degeneration, left eye: Secondary | ICD-10-CM | POA: Diagnosis not present

## 2017-02-17 DIAGNOSIS — H348322 Tributary (branch) retinal vein occlusion, left eye, stable: Secondary | ICD-10-CM | POA: Diagnosis not present

## 2017-02-18 ENCOUNTER — Telehealth: Payer: Self-pay

## 2017-02-18 NOTE — Telephone Encounter (Signed)
Call to Mr. Marcelline Deist. Has apt at [facility] 10/3 but Dr. Yong Channel is now at [place] he needs to schedule with Cassie at [facility]. Offered to make the apt, but stated he will take care of it.  Just FYI

## 2017-02-19 ENCOUNTER — Ambulatory Visit: Payer: Medicare Other

## 2017-02-20 ENCOUNTER — Other Ambulatory Visit: Payer: Medicare Other

## 2017-02-20 DIAGNOSIS — B351 Tinea unguium: Secondary | ICD-10-CM

## 2017-02-24 ENCOUNTER — Ambulatory Visit: Payer: Medicare Other | Admitting: *Deleted

## 2017-02-24 NOTE — Progress Notes (Signed)
PCP notes:   Health maintenance: Flu: Received today.  Abnormal screenings: No.   Patient concerns: No.   Nurse concerns: No.   Next PCP appt: 03/10/2018.

## 2017-02-24 NOTE — Progress Notes (Signed)
Subjective:   Corey Fischer is a 83 y.o. male who presents for Medicare Annual/Subsequent preventive examination.  Review of Systems:  No ROS.  Medicare Wellness Visit. Additional risk factors are reflected in the social history.  Cardiac Risk Factors include: advanced age (>23men, >33 women);dyslipidemia;hypertension;male gender     Objective:    Vitals: BP (!) 138/58   Pulse (!) 46   Resp 16   Ht 5\' 3"  (1.6 m)   Wt 155 lb 4.8 oz (70.4 kg)   SpO2 98%   BMI 27.51 kg/m   Body mass index is 27.51 kg/m.  Tobacco History  Smoking Status  . Never Smoker  Smokeless Tobacco  . Never Used     Counseling given: Not Answered   Past Medical History:  Diagnosis Date  . Abdominal pain 03/19/2015  . Allergy   . Anxiety   . Arthritis   . Asymptomatic bilateral carotid artery stenosis    a. Last duplex 11/2013 R bulb and ICA - 50-69%, R ECA >50%. L bulb and prox ICA: 0-49%. Repeat due 11/2014.  [name] B12 deficiency   . Bilateral carotid artery disease ([place])   . BPH (benign prostatic hyperplasia)   . Chronic ITP (idiopathic thrombocytopenia) ([facility])   . Depression   . Diverticulosis   . Elevated PSA   . Gastric polyp   . Gastritis   . GERD (gastroesophageal reflux disease)   . H/O hiatal hernia   . Hemorrhoids   . History of hiatal hernia   . Hx of adenomatous colonic polyps 2010  . Hyperlipidemia   . Hypertension   . INTERNAL HEMORRHOIDS WITHOUT MENTION COMP 08/14/2007   Qualifier: Diagnosis of  By: Arnoldo Morale MD, John E   . Osteoarthritis hips  . PAC (premature atrial contraction)    a. NSR/SB with 1st degree AVB and PACs/atrial bigeminy seen on event monitor 11/2012.  [name] Palpitations   . Raynaud's syndrome   . Sinus bradycardia   . Stricture and stenosis of esophagus 2010   last endoscopy- 2010  . Symptomatic PVCs    a. Per report, monitor 05/2012 - NSR with symptomatic PVC. b. Echo 05/2012- EF 55-60%, aortic sclerosis without stenosis. c. Nuc 12/2012 - normal.  . Wandering  (atrial) pacemaker    Past Surgical History:  Procedure Laterality Date  . APPENDECTOMY  age 47  . CARDIAC CATHETERIZATION  1989   Told that he is "pristine "  . CAROTID DUPLEX Bilateral 05-26-2007  . CHOLECYSTECTOMY  1989  . COLONOSCOPY WITH PROPOFOL N/A 03/22/2015   Procedure: COLONOSCOPY WITH PROPOFOL;  Surgeon: Jerene Bears, MD;  Location: [facility];  Service: Endoscopy;  Laterality: N/A;  . ESOPHAGOGASTRODUODENOSCOPY (EGD) WITH PROPOFOL N/A 03/22/2015   Procedure: ESOPHAGOGASTRODUODENOSCOPY (EGD) WITH PROPOFOL;  Surgeon: Jerene Bears, MD;  Location: [facility];  Service: Endoscopy;  Laterality: N/A;  . LAPAROSCOPIC INGUINAL HERNIA REPAIR Bilateral 09-19-2010  . LEFT SHOULDER SURG.  2006  . ROTATOR CUFF REPAIR Right 1987  . TONSILLECTOMY  age 32  . TOTAL HIP ARTHROPLASTY Left 05/31/2013   Procedure: TOTAL HIP ARTHROPLASTY;  Surgeon: Kerin Salen, MD;  Location: [facility];  Service: Orthopedics;  Laterality: Left;  . TRANSURETHRAL RESECTION OF PROSTATE  07/22/2011   Procedure: TRANSURETHRAL RESECTION OF THE PROSTATE WITH GYRUS INSTRUMENTS;  Surgeon: Bernestine Amass, MD;  Location[facility];  Service: Urology;  Laterality: N/A;  Saline Gyrus TURP    Family History  Problem Relation Age of Onset  . Heart  disease Mother   . Colon polyps Mother   . Heart disease Father   . Angina Father   . Cancer Maternal Grandmother   . Heart failure Maternal Grandfather   . Colon cancer Neg Hx    History  Sexual Activity  . Sexual activity: Yes    Outpatient Encounter Prescriptions as of 02/25/2017  Medication Sig  . amLODipine (NORVASC) 2.5 MG tablet Take 1 tablet (2.5 mg total) by mouth at bedtime.  . Ascorbic Acid (VITAMIN C) 1000 MG tablet Take 1,000 mg by mouth daily.   [name] aspirin 81 MG tablet Take 81 mg by mouth daily.  [name] atenolol (TENORMIN) 25 MG tablet TAKE 1/2 TABLET BY MOUTH ONCE DAILY  . atorvastatin (LIPITOR) 20 MG tablet TAKE 1 TABLET BY MOUTH EVERY DAY  .  cetirizine (ZYRTEC) 10 MG tablet Take 10 mg by mouth daily with breakfast.   . Cholecalciferol (VITAMIN D3) 2000 units capsule Take 2,000 Units by mouth daily.  . Coenzyme Q10 50 MG CAPS Take 250 mg by mouth daily.   . Cyanocobalamin 2500 MCG SUBL Place 2,500 mcg under the tongue daily.   . diazepam (VALIUM) 5 MG tablet Take 0.5-1 tablets (2.5-5 mg total) by mouth at bedtime as needed for anxiety (or sleep).  . hydrocortisone-pramoxine (ANALPRAM-HC) 2.5-1 % rectal cream Place 1 application rectally as needed.   . Magnesium 200 MG TABS Take 200 mg by mouth daily.   . NONFORMULARY OR COMPOUNDED ITEM [facility]:  Onychomycosis nail lacquer. Apply 1-2 grams to affected area 3-4 times dails.  . ondansetron (ZOFRAN ODT) 4 MG disintegrating tablet Place 1 tablet on the tongue every 6 hours as needed for nausea.  . pantoprazole (PROTONIX) 40 MG tablet Take 1 tablet (40 mg total) by mouth daily.  . polyethylene glycol (MIRALAX / GLYCOLAX) packet Take 17 g by mouth daily.   . tamsulosin (FLOMAX) 0.4 MG CAPS capsule Take 0.4 mg by mouth daily.  . Zinc 25 MG TABS Take 25 mg by mouth daily.    No facility-administered encounter medications on file as of 02/25/2017.     Activities of Daily Living In your present state of health, do you have any difficulty performing the following activities: 02/25/2017  Hearing? N  Vision? N  Difficulty concentrating or making decisions? N  Walking or climbing stairs? N  Dressing or bathing? N  Doing errands, shopping? N  Preparing Food and eating ? N  Using the Toilet? N  In the past six months, have you accidently leaked urine? N  Do you have problems with loss of bowel control? N  Managing your Medications? N  Managing your Finances? N  Housekeeping or managing your Housekeeping? N  Some recent data might be hidden    Patient Care Team: Marin Olp, MD as PCP - General (Family Medicine) Zadie Rhine Clent Demark, MD as Consulting Physician  (Ophthalmology) Monna Fam, MD as Consulting Physician (Ophthalmology) Trula Slade, DPM as Consulting Physician (Podiatry) [name], MD as Consulting Physician (Cardiology) Mcarthur Rossetti, MD as Consulting Physician (Orthopedic Surgery) Pyrtle, Lajuan Lines, MD as Consulting Physician (Gastroenterology)   Assessment:    Physical assessment deferred to PCP.  Exercise Activities and Dietary recommendations Current Exercise Habits: Home exercise routine, Type of exercise: yoga;strength training/weights;treadmill;stretching, Time (Minutes): 60, Frequency (Times/Week): 5, Weekly Exercise (Minutes/Week): 300, Intensity: Moderate, Exercise limited by: None identified  Goals    None     Fall Risk Fall Risk  02/25/2017 02/25/2017 01/19/2016 01/09/2016 07/06/2014  Falls in the past year? No No No No No  Comment - - Emmi Telephone Survey: data to providers prior to load - -   Depression Screen PHQ 2/9 Scores 02/25/2017 02/25/2017 01/09/2016 07/06/2014  PHQ - 2 Score 0 0 0 0    Cognitive Function Ad8 score reviewed for issues:  Issues making decisions: no  Less interest in hobbies / activities: no  Repeats questions, stories (family complaining): no  Trouble using ordinary gadgets (microwave, computer, phone): no  Forgets the month or year:  no  Mismanaging finances: no  Remembering appts:no  Daily problems with thinking and/or memory:no Ad8 score is=0          Immunization History  Administered Date(s) Administered  . H1N1 05/03/2008  . Influenza Split 02/23/2013  . Influenza Whole 05/20/2002, 03/01/2009  . Influenza, High Dose Seasonal PF 02/28/2016, 02/25/2017  . Influenza,inj,Quad PF,6+ Mos 02/24/2014, 02/28/2015  . Pneumococcal Conjugate-13 05/07/2013  . Pneumococcal Polysaccharide-23 05/20/2005  . Td 05/20/2005  . Tdap 01/09/2016  . Zoster 05/21/2011   Screening Tests Health Maintenance  Topic Date Due  . TETANUS/TDAP  01/08/2026  .  INFLUENZA VACCINE  Completed  . PNA vac Low Risk Adult  Completed      Plan:   Follow up with PCP as directed.  I have personally reviewed and noted the following in the patient's chart:   . Medical and social history . Use of alcohol, tobacco or illicit drugs  . Current medications and supplements . Functional ability and status . Nutritional status . Physical activity . Advanced directives . List of other physicians . Vitals . Screenings to include cognitive, depression, and falls . Referrals and appointments  In addition, I have reviewed and discussed with patient certain preventive protocols, quality metrics, and best practice recommendations. A written personalized care plan for preventive services as well as general preventive health recommendations were provided to patient.     Williemae Area, RN  02/25/2017

## 2017-02-24 NOTE — Progress Notes (Signed)
Pre visit review using our clinic review tool, if applicable. No additional management support is needed unless otherwise documented below in the visit note. 

## 2017-02-25 ENCOUNTER — Encounter: Payer: Self-pay | Admitting: Family Medicine

## 2017-02-25 ENCOUNTER — Ambulatory Visit (INDEPENDENT_AMBULATORY_CARE_PROVIDER_SITE_OTHER): Payer: Medicare Other | Admitting: Family Medicine

## 2017-02-25 ENCOUNTER — Ambulatory Visit (INDEPENDENT_AMBULATORY_CARE_PROVIDER_SITE_OTHER): Payer: Medicare Other | Admitting: *Deleted

## 2017-02-25 VITALS — BP 138/58 | HR 46 | Temp 98.4°F | Ht 67.0 in | Wt 155.2 lb

## 2017-02-25 VITALS — BP 138/58 | HR 46 | Resp 16 | Ht 63.0 in | Wt 155.3 lb

## 2017-02-25 DIAGNOSIS — Z0000 Encounter for general adult medical examination without abnormal findings: Secondary | ICD-10-CM | POA: Diagnosis not present

## 2017-02-25 DIAGNOSIS — R351 Nocturia: Secondary | ICD-10-CM | POA: Diagnosis not present

## 2017-02-25 DIAGNOSIS — F411 Generalized anxiety disorder: Secondary | ICD-10-CM | POA: Diagnosis not present

## 2017-02-25 DIAGNOSIS — E538 Deficiency of other specified B group vitamins: Secondary | ICD-10-CM | POA: Diagnosis not present

## 2017-02-25 DIAGNOSIS — I10 Essential (primary) hypertension: Secondary | ICD-10-CM | POA: Diagnosis not present

## 2017-02-25 DIAGNOSIS — N401 Enlarged prostate with lower urinary tract symptoms: Secondary | ICD-10-CM | POA: Diagnosis not present

## 2017-02-25 DIAGNOSIS — E785 Hyperlipidemia, unspecified: Secondary | ICD-10-CM | POA: Diagnosis not present

## 2017-02-25 DIAGNOSIS — R739 Hyperglycemia, unspecified: Secondary | ICD-10-CM | POA: Diagnosis not present

## 2017-02-25 DIAGNOSIS — I6523 Occlusion and stenosis of bilateral carotid arteries: Secondary | ICD-10-CM | POA: Diagnosis not present

## 2017-02-25 DIAGNOSIS — Z23 Encounter for immunization: Secondary | ICD-10-CM

## 2017-02-25 NOTE — Progress Notes (Signed)
I have reviewed and agree with note, evaluation, plan.   Stephen Hunter, MD  

## 2017-02-25 NOTE — Progress Notes (Signed)
Phone: [phone]  Subjective:  Patient presents today for their annual physical. Chief complaint-noted.   See problem oriented charting- ROS- full  review of systems was completed and negative except for: nocturia, weak stream  The following were reviewed and entered/updated in epic: Past Medical History:  Diagnosis Date  . Abdominal pain 03/19/2015  . Allergy   . Anxiety   . Arthritis   . Asymptomatic bilateral carotid artery stenosis    a. Last duplex 11/2013 R bulb and ICA - 50-69%, R ECA >50%. L bulb and prox ICA: 0-49%. Repeat due 11/2014.  [name] B12 deficiency   . Bilateral carotid artery disease ([place])   . BPH (benign prostatic hyperplasia)   . Chronic ITP (idiopathic thrombocytopenia) (H[facility])   . Depression   . Diverticulosis   . Elevated PSA   . Gastric polyp   . Gastritis   . GERD (gastroesophageal reflux disease)   . H/O hiatal hernia   . Hemorrhoids   . History of hiatal hernia   . Hx of adenomatous colonic polyps 2010  . Hyperlipidemia   . Hypertension   . INTERNAL HEMORRHOIDS WITHOUT MENTION COMP 08/14/2007   Qualifier: Diagnosis of  By: Arnoldo Morale MD, John E   . Osteoarthritis hips  . PAC (premature atrial contraction)    a. NSR/SB with 1st degree AVB and PACs/atrial bigeminy seen on event monitor 11/2012.  [name] Palpitations   . Raynaud's syndrome   . Sinus bradycardia   . Stricture and stenosis of esophagus 2010   last endoscopy- 2010  . Symptomatic PVCs    a. Per report, monitor 05/2012 - NSR with symptomatic PVC. b. Echo 05/2012- EF 55-60%, aortic sclerosis without stenosis. c. Nuc 12/2012 - normal.  . Wandering (atrial) pacemaker    Patient Active Problem List   Diagnosis Date Noted  . Pancreatic cyst 03/28/2015    Priority: High  . Carotid artery stenosis 11/05/2007    Priority: High  . Hyperglycemia 07/06/2014    Priority: Medium  . Symptomatic PVCs     Priority: Medium  . Anxiety state 12/30/2013    Priority: Medium  . Bradycardia 12/10/2012   Priority: Medium  . PAC (premature atrial contraction), symptomatic 10/16/2012    Priority: Medium  . Benign prostatic hyperplasia with nocturia 07/22/2011    Priority: Medium  . THROMBOCYTHEMIA 04/21/2008    Priority: Medium  . Essential hypertension 04/29/2007    Priority: Medium  . Hyperlipidemia 01/22/2007    Priority: Medium  . Osteoarthritis 03/16/2010    Priority: Low  . Osteopenia 07/06/2009    Priority: Low  . Raynaud's syndrome 06/02/2009    Priority: Low  . Prostatitis 03/01/2009    Priority: Low  . CONSTIPATION, CHRONIC 07/28/2008    Priority: Low  . ESOPHAGEAL STRICTURE 03/04/2008    Priority: Low  . COLONIC POLYPS, HX OF 03/04/2008    Priority: Low  . Unspecified visual loss 10/30/2007    Priority: Low  . Vitamin B12 deficiency 01/26/2007    Priority: Low  . GERD 01/22/2007    Priority: Low  . Generalized abdominal pain 10/18/2015  . Bloating 10/18/2015  . Schatzki's ring    Past Surgical History:  Procedure Laterality Date  . APPENDECTOMY  age 77  . CARDIAC CATHETERIZATION  1989   Told that he is "pristine "  . CAROTID DUPLEX Bilateral 05-26-2007  . CHOLECYSTECTOMY  1989  . COLONOSCOPY WITH PROPOFOL N/A 03/22/2015   Procedure: COLONOSCOPY WITH PROPOFOL;  Surgeon: Jerene Bears, MD;  Location: [facility];  Service: Endoscopy;  Laterality: N/A;  . ESOPHAGOGASTRODUODENOSCOPY (EGD) WITH PROPOFOL N/A 03/22/2015   Procedure: ESOPHAGOGASTRODUODENOSCOPY (EGD) WITH PROPOFOL;  Surgeon: Jerene Bears, MD;  Location: [facility];  Service: Endoscopy;  Laterality: N/A;  . LAPAROSCOPIC INGUINAL HERNIA REPAIR Bilateral 09-19-2010  . LEFT SHOULDER SURG.  2006  . ROTATOR CUFF REPAIR Right 1987  . TONSILLECTOMY  age 62  . TOTAL HIP ARTHROPLASTY Left 05/31/2013   Procedure: TOTAL HIP ARTHROPLASTY;  Surgeon: Kerin Salen, MD;  Location: [facility];  Service: Orthopedics;  Laterality: Left;  . TRANSURETHRAL RESECTION OF PROSTATE  07/22/2011   Procedure: TRANSURETHRAL RESECTION  OF THE PROSTATE WITH GYRUS INSTRUMENTS;  Surgeon: Bernestine Amass, MD;  Location: [facility];  Service: Urology;  Laterality: N/A;  Saline Gyrus TURP     Family History  Problem Relation Age of Onset  . Heart disease Mother   . Colon polyps Mother   . Heart disease Father   . Angina Father   . Cancer Maternal Grandmother   . Heart failure Maternal Grandfather   . Colon cancer Neg Hx     Medications- reviewed and updated Current Outpatient Prescriptions  Medication Sig Dispense Refill  . amLODipine (NORVASC) 2.5 MG tablet Take 1 tablet (2.5 mg total) by mouth at bedtime. 90 tablet 3  . Ascorbic Acid (VITAMIN C) 1000 MG tablet Take 1,000 mg by mouth daily.     [name] aspirin 81 MG tablet Take 81 mg by mouth daily.    [name] atenolol (TENORMIN) 25 MG tablet TAKE 1/2 TABLET BY MOUTH ONCE DAILY 45 tablet 1  . atorvastatin (LIPITOR) 20 MG tablet TAKE 1 TABLET BY MOUTH EVERY DAY 90 tablet 1  . cetirizine (ZYRTEC) 10 MG tablet Take 10 mg by mouth daily with breakfast.     . Cholecalciferol (VITAMIN D3) 2000 units capsule Take 2,000 Units by mouth daily.    . Coenzyme Q10 50 MG CAPS Take 250 mg by mouth daily.     . Cyanocobalamin 2500 MCG SUBL Place 2,500 mcg under the tongue daily.     . diazepam (VALIUM) 5 MG tablet Take 0.5-1 tablets (2.5-5 mg total) by mouth at bedtime as needed for anxiety (or sleep). 30 tablet 5  . hydrocortisone-pramoxine (ANALPRAM-HC) 2.5-1 % rectal cream Place 1 application rectally as needed.     . Magnesium 200 MG TABS Take 200 mg by mouth daily.     . NONFORMULARY OR COMPOUNDED ITEM [facility]:  Onychomycosis nail lacquer. Apply 1-2 grams to affected area 3-4 times dails. 120 each 2  . ondansetron (ZOFRAN ODT) 4 MG disintegrating tablet Place 1 tablet on the tongue every 6 hours as needed for nausea. 30 tablet 1  . pantoprazole (PROTONIX) 40 MG tablet Take 1 tablet (40 mg total) by mouth daily. 90 tablet 1  . polyethylene glycol (MIRALAX / GLYCOLAX)  packet Take 17 g by mouth daily.     . tamsulosin (FLOMAX) 0.4 MG CAPS capsule Take 0.4 mg by mouth daily.    . Zinc 25 MG TABS Take 25 mg by mouth daily.      No current facility-administered medications for this visit.     Allergies-reviewed and updated Allergies  Allergen Reactions  . Ciprofloxacin Other (See Comments)    Unknown reaction    Social History   Social History  . Marital status: Married    Spouse name: N/A  . Number of children: 2  . Years of education: N/A   Occupational  History  . retired Retired   Social History Main Topics  . Smoking status: Never Smoker  . Smokeless tobacco: Never Used  . Alcohol use Yes     Comment: occasionally  . Drug use: No  . Sexual activity: Yes   Other Topics Concern  . None   Social History Narrative   Grew up in [place] town in [place]. 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 Met wife in [place]. At 31, got married and went to college-undergrad in [employer] as well as Oceanographer, doctorate in Location manager.  Worked at [employer] which eventually became [employer]. Lived in [place] and [place] before eventually moving to [place]. 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       Married over 50 years. 80 years 01/2014. 2 married children with 2 children each. 1 son near [place]. 1 son in [place] with house of rep.       Hobbies: work out most mornings 5 days a week (spin, elipitical), racquetball until 2014 after hip surgery.     Objective: BP (!) 138/58 (BP Location: Left Arm, Patient Position: Sitting, Cuff Size: Large)   Pulse (!) 46   Temp 98.4 F (36.9 C) (Oral)   Ht 5' 7"  (1.702 m)   Wt 155 lb 3.2 oz (70.4 kg)   SpO2 98%   BMI 24.31 kg/m  Gen: NAD, resting comfortably HEENT: Mucous membranes are moist. Oropharynx normal Neck: no thyromegaly CV: bradycardic no murmurs rubs or gallops. Regular rhythm Lungs: CTAB no crackles, wheeze, rhonchi Abdomen: soft/nontender/nondistended/normal  bowel sounds. No rebound or guarding.  Ext: no edema Skin: warm, dry Neuro: grossly normal, moves all extremities, PERRLA Rectal: normal tone, diffusely enlarged prostate, no masses or tenderness  Assessment/Plan:  83 y.o. male presenting for annual physical.  Health Maintenance counseling: 1. Anticipatory guidance: Patient counseled regarding regular dental exams - q6 months, eye exams yearly - sees 2 separate doctors including Dr. Zadie Rhine, wearing seatbelts.  2. Risk factor reduction:  Advised patient of need for regular exercise and diet rich and fruits and vegetables to reduce risk of heart attack and stroke. Exercise- 5 days a week for most part. Diet-reasonable.  Wt Readings from Last 3 Encounters:  02/25/17 155 lb 4.8 oz (70.4 kg)  02/25/17 155 lb 3.2 oz (70.4 kg)  12/10/16 155 lb (70.3 kg)  3. Immunizations/screenings/ancillary studies- discussed shingrix at pharmacy Immunization History  Administered Date(s) Administered  . H1N1 05/03/2008  . Influenza Split 02/23/2013  . Influenza Whole 05/20/2002, 03/01/2009  . Influenza, High Dose Seasonal PF 02/28/2016, 02/25/2017  . Influenza,inj,Quad PF,6+ Mos 02/24/2014, 02/28/2015  . Pneumococcal Conjugate-13 05/07/2013  . Pneumococcal Polysaccharide-23 05/20/2005  . Td 05/20/2005  . Tdap 01/09/2016  . Zoster 05/21/2011  4. Prostate cancer screening- BPH with nocturia even after TURP. Taking tamsulosin again- slightly improved flow. Has had some nights as low as 1 for nocturia others 3-4.   Discussed stopping psa screening due to risks outweigh benefits- he declines. See BPH section Lab Results  Component Value Date   PSA 2.85 01/09/2016   PSA 2.85 01/09/2016   PSA 2.99 03/21/2015   5. Colon cancer screening - 03/22/15 colonoscopy- he states he was told 10 year follow up- will reevaluate at that time- passed age based screening 6. Skin cancer screening- saw dermatology last spring 2018. Asks for exam today. Examining the entire  body outside of underwear-no obvious precancerous or cancerous lesions. Discussed that I am not a dermatologist-he  may try to find a new dermatologist  Status of chronic or acute concerns   Essential hypertension HTN- controlled on current meds-amlodipine and atenolol. Cardiology has not suggested reducing atenlol or stopping- will defer to their judgement- he hadFrequent PVCs when he was not on atenolol  Carotid artery stenosis Carotid artery stenosis- stable over last year. Follows with cardiology.   Vitamin B12 deficiency b12 deficiency history-he dropped supplement level due to last level being high- will repeat level  Hyperglycemia Hyperglycemia- update a1c along with fasting CBG  Hyperlipidemia Compliant with atorvastatin 20 mg. Update lipids  Anxiety state Insomnia primarily- sparing diazepam  Benign prostatic hyperplasia with nocturia Retrograde ejaculation worse since cystoscope. May trial off tamsulosin due to this.    He strongly requests PSA to be done. I discussed risks outweigh benefits but he would like to continue regardless despite counseling  Future Appointments Date Time Provider [place]  05/27/2017 2:45 PM Trula Slade, DPM [facility]  03/03/2018 9:00 AM Williemae Area, RN [facility] None  03/03/2018 10:00 AM [facility] LAB [facility] None  03/10/2018 8:30 AM Marin Olp, MD [facility] None   Only desires once a year visit. Declined six-month follow-up  Orders Placed This Encounter  Procedures  . Flu vaccine HIGH DOSE PF  . Lipid panel    [place]    Order Specific Question:   Has the patient fasted?    Answer:   No  . PSA  . CBC    [place]  . Comprehensive metabolic panel    [place]    Order Specific Question:   Has the patient fasted?    Answer:   No  . Hemoglobin A1c      . Vitamin B12    Meds ordered this encounter  Medications  . tamsulosin (FLOMAX) 0.4 MG CAPS capsule    Sig: Take 0.4 mg by mouth  daily.    Return precautions advised.  Garret Reddish, MD

## 2017-02-25 NOTE — Assessment & Plan Note (Signed)
b12 deficiency history-he dropped supplement level due to last level being high- will repeat level

## 2017-02-25 NOTE — Assessment & Plan Note (Signed)
Carotid artery stenosis- stable over last year. Follows with cardiology.

## 2017-02-25 NOTE — Assessment & Plan Note (Signed)
Compliant with atorvastatin 20 mg. Update lipids

## 2017-02-25 NOTE — Assessment & Plan Note (Signed)
Hyperglycemia- update a1c along with fasting CBG

## 2017-02-25 NOTE — Patient Instructions (Signed)
Glad you are doing well  Keep up the exercise and healthy eating  Send you mychart message about results. See Cassie first and then stop by lab

## 2017-02-25 NOTE — Assessment & Plan Note (Signed)
Insomnia primarily- sparing diazepam

## 2017-02-25 NOTE — Assessment & Plan Note (Signed)
HTN- controlled on current meds-amlodipine and atenolol. Cardiology has not suggested reducing atenlol or stopping- will defer to their judgement- he hadFrequent PVCs when he was not on atenolol

## 2017-02-25 NOTE — Assessment & Plan Note (Signed)
Retrograde ejaculation worse since cystoscope. May trial off tamsulosin due to this.    He strongly requests PSA to be done. I discussed risks outweigh benefits but he would like to continue regardless despite counseling

## 2017-02-26 LAB — LIPID PANEL
Cholesterol: 121 mg/dL (ref 0–200)
HDL: 42.6 mg/dL (ref >39.00)
LDL CALC: 51 mg/dL (ref 0–99)
NonHDL: 78.49
TRIGLYCERIDES: 137 mg/dL (ref 0.0–149.0)
Total CHOL/HDL Ratio: 3
VLDL: 27.4 mg/dL (ref 0.0–40.0)

## 2017-02-26 LAB — COMPREHENSIVE METABOLIC PANEL
ALBUMIN: 4.3 g/dL (ref 3.5–5.2)
ALK PHOS: 50 U/L (ref 39–117)
ALT: 18 U/L (ref 0–53)
AST: 18 U/L (ref 0–37)
BUN: 16 mg/dL (ref 6–23)
CHLORIDE: 102 meq/L (ref 96–112)
CO2: 30 mEq/L (ref 19–32)
Calcium: 9.5 mg/dL (ref 8.4–10.5)
Creatinine, Ser: 1.14 mg/dL (ref 0.40–1.50)
GFR: 65.19 mL/min (ref >60.00)
Glucose, Bld: 96 mg/dL (ref 70–99)
Potassium: 4.3 mEq/L (ref 3.5–5.1)
SODIUM: 138 meq/L (ref 135–145)
Total Bilirubin: 1 mg/dL (ref 0.2–1.2)
Total Protein: 6.7 g/dL (ref 6.0–8.3)

## 2017-02-26 LAB — CBC
HEMATOCRIT: 43.8 % (ref 39.0–52.0)
HEMOGLOBIN: 14.6 g/dL (ref 13.0–17.0)
MCHC: 33.3 g/dL (ref 30.0–36.0)
MCV: 89.8 fl (ref 78.0–100.0)
Platelets: 120 10*3/uL — ABNORMAL LOW (ref 150.0–400.0)
RBC: 4.88 Mil/uL (ref 4.22–5.81)
RDW: 13.6 % (ref 11.5–15.5)
WBC: 5.7 10*3/uL (ref 4.0–10.5)

## 2017-02-26 LAB — VITAMIN B12: VITAMIN B 12: 853 pg/mL (ref 211–911)

## 2017-02-26 LAB — HEMOGLOBIN A1C: HEMOGLOBIN A1C: 6.1 % (ref 4.6–6.5)

## 2017-02-26 LAB — PSA: PSA: 3.66 ng/mL (ref 0.10–4.00)

## 2017-02-27 ENCOUNTER — Other Ambulatory Visit: Payer: Self-pay

## 2017-02-27 DIAGNOSIS — R972 Elevated prostate specific antigen [PSA]: Secondary | ICD-10-CM

## 2017-03-08 ENCOUNTER — Other Ambulatory Visit: Payer: Self-pay | Admitting: Cardiovascular Disease

## 2017-03-12 ENCOUNTER — Other Ambulatory Visit: Payer: Self-pay | Admitting: Internal Medicine

## 2017-03-20 ENCOUNTER — Telehealth: Payer: Self-pay | Admitting: Family Medicine

## 2017-03-20 NOTE — Telephone Encounter (Signed)
Wife calling for script for shingrix vaccine to take to urgent care.   Pharmacy is out of the vaccine.  Wants it sent to [facility]  [address]  Would like a call to confirm this has been completed.  Ty,  -LL

## 2017-03-25 ENCOUNTER — Other Ambulatory Visit: Payer: Self-pay | Admitting: Family Medicine

## 2017-03-25 MED ORDER — ZOSTER VAC RECOMB ADJUVANTED 50 MCG/0.5ML IM SUSR: 0.5000 mL | Freq: Once | INTRAMUSCULAR | 0 refills | Status: DC

## 2017-03-25 MED FILL — SHINGRIX 50 MCG SUS: 50 | 1 days supply | Qty: 1 | Fill #0

## 2017-03-25 NOTE — Addendum Note (Signed)
Addended by: Marian Sorrow on: 03/25/2017 11:01 AM   Modules accepted: Orders

## 2017-03-25 NOTE — Telephone Encounter (Signed)
Rx for Shingrix sent to pharmacy as requested.

## 2017-03-25 NOTE — Telephone Encounter (Signed)
Spoke to pt's wife Corey Fischer, told her Rx for Shingrix vaccine injection was sent to pharmacy. Corey Fischer verbalized understanding.

## 2017-03-26 ENCOUNTER — Other Ambulatory Visit: Payer: Self-pay

## 2017-03-26 ENCOUNTER — Telehealth: Payer: Self-pay | Admitting: Family Medicine

## 2017-03-26 MED ORDER — AMLODIPINE BESYLATE 2.5 MG PO TABS: 2.5000 mg | ORAL_TABLET | Freq: Every day | ORAL | 3 refills | Status: DC

## 2017-03-26 NOTE — Telephone Encounter (Signed)
Prescription sent to pharmacy as requested.

## 2017-03-26 NOTE — Telephone Encounter (Signed)
MEDICATION: amlodipine 2.5mg  tab  PHARMACY:  [id number] [address]  IS THIS A 90 DAY SUPPLY : yes  IS PATIENT OUT OF MEDICATION: no  IF NOT; HOW MUCH IS LEFT: 1 week  LAST APPOINTMENT DATE: @10 /01/2017  NEXT APPOINTMENT DATE:@11 /04/2017  OTHER COMMENTS:    **Let patient know to contact pharmacy at the end of the day to make sure medication is ready. **  ** Please notify patient to allow 48-72 hours to process**  **Encourage patient to contact the pharmacy for refills or they can request refills through [facility]**

## 2017-03-27 DIAGNOSIS — H348122 Central retinal vein occlusion, left eye, stable: Secondary | ICD-10-CM | POA: Diagnosis not present

## 2017-03-27 DIAGNOSIS — H26491 Other secondary cataract, right eye: Secondary | ICD-10-CM | POA: Diagnosis not present

## 2017-03-27 DIAGNOSIS — H40013 Open angle with borderline findings, low risk, bilateral: Secondary | ICD-10-CM | POA: Diagnosis not present

## 2017-03-27 DIAGNOSIS — H35033 Hypertensive retinopathy, bilateral: Secondary | ICD-10-CM | POA: Diagnosis not present

## 2017-03-31 ENCOUNTER — Other Ambulatory Visit (INDEPENDENT_AMBULATORY_CARE_PROVIDER_SITE_OTHER): Payer: Medicare Other

## 2017-03-31 DIAGNOSIS — R972 Elevated prostate specific antigen [PSA]: Secondary | ICD-10-CM

## 2017-03-31 LAB — PSA: PSA: 5.68 ng/mL — AB (ref 0.10–4.00)

## 2017-04-01 ENCOUNTER — Other Ambulatory Visit: Payer: Self-pay

## 2017-04-01 ENCOUNTER — Encounter: Payer: Self-pay | Admitting: Family Medicine

## 2017-04-01 DIAGNOSIS — R972 Elevated prostate specific antigen [PSA]: Secondary | ICD-10-CM

## 2017-04-17 ENCOUNTER — Other Ambulatory Visit: Payer: Self-pay

## 2017-04-17 MED ORDER — ATORVASTATIN CALCIUM 20 MG PO TABS: 20.0000 mg | ORAL_TABLET | Freq: Every day | ORAL | 1 refills | Status: DC

## 2017-04-30 DIAGNOSIS — N400 Enlarged prostate without lower urinary tract symptoms: Secondary | ICD-10-CM | POA: Diagnosis not present

## 2017-04-30 DIAGNOSIS — N3281 Overactive bladder: Secondary | ICD-10-CM | POA: Diagnosis not present

## 2017-05-26 MED FILL — SHINGRIX 50 MCG SUS: 50 | 1 days supply | Qty: 1 | Fill #0

## 2017-05-27 ENCOUNTER — Ambulatory Visit (INDEPENDENT_AMBULATORY_CARE_PROVIDER_SITE_OTHER): Payer: Medicare Other | Admitting: Podiatry

## 2017-05-27 DIAGNOSIS — B351 Tinea unguium: Secondary | ICD-10-CM | POA: Diagnosis not present

## 2017-05-28 NOTE — Progress Notes (Signed)
Subjective: [place] today for onychomycosis.  He has had 6 treatments of the laser he states that it does not help he thinks that the toenails are been getting worse.  Denies any pain in the nails he is concerned about the aesthetics.  He denies any drainage or swelling or any redness of the toenail sites.  He has no other concerns today. Denies any systemic complaints such as fevers, chills, nausea, vomiting. No acute changes since last appointment, and no other complaints at this time.   Objective: AAO x3, NAD DP/PT pulses palpable bilaterally, CRT less than 3 seconds Nails continue be hypertrophic, dystrophic, discolored with yellow-brown discoloration.  There is no surrounding redness or drainage or any clinical signs of infection noted. No open lesions or pre-ulcerative lesions.  No pain with calf compression, swelling, warmth, erythema  Assessment: Onychomycosis  Plan: -All treatment options discussed with the patient including all alternatives, risks, complications.  -Unfortunately the laser therapy has not helped.  He is also been using topical antifungal.  He states he is in a stop this and go back to using Listerine soaks.  Today I did debride the nails I sent this for culture to [facility] labs to evaluate for onychomycosis.  The specimen was given to [facility].  -Follow-up with her nail culture sooner if any issues are to arise.  He agrees this plan has no further questions or concerns. -Patient encouraged to call the office with any questions, concerns, change in symptoms.   Trula Slade DPM

## 2017-06-30 ENCOUNTER — Telehealth: Payer: Self-pay | Admitting: *Deleted

## 2017-06-30 MED ORDER — PANTOPRAZOLE SODIUM 40 MG PO TBEC: 40.0000 mg | DELAYED_RELEASE_TABLET | Freq: Every day | ORAL | 1 refills | Status: DC

## 2017-06-30 NOTE — Telephone Encounter (Signed)
Rx sent 

## 2017-07-01 ENCOUNTER — Telehealth: Payer: Self-pay | Admitting: Cardiovascular Disease

## 2017-07-01 ENCOUNTER — Encounter: Payer: Self-pay | Admitting: Podiatry

## 2017-07-01 NOTE — Telephone Encounter (Signed)
Spoke with pt, he is due for carotid dopplers in July. Dopplers scheduled.

## 2017-07-01 NOTE — Telephone Encounter (Signed)
Patient states that he is supposed to have doppler, patient would like to verify. I do not see recent order for doppler

## 2017-07-02 NOTE — Telephone Encounter (Signed)
I spoke with pt's wife, Marlowe Kays she states call pt again in an hour.

## 2017-07-02 NOTE — Telephone Encounter (Signed)
I informed pt of Dr. Leigh Aurora review of results and orders. Pt states he also got a call from [place], but was unable to get an appt when transferred to the schedulers. I told pt I would send a message to schedulers to call and assist with his appt.

## 2017-07-07 ENCOUNTER — Ambulatory Visit (INDEPENDENT_AMBULATORY_CARE_PROVIDER_SITE_OTHER): Payer: Medicare Other

## 2017-07-07 DIAGNOSIS — B351 Tinea unguium: Secondary | ICD-10-CM

## 2017-07-07 MED ORDER — EFINACONAZOLE 10 % EX SOLN: 1.0000 [drp] | Freq: Every day | CUTANEOUS | 6 refills | Status: DC

## 2017-07-07 NOTE — Progress Notes (Signed)
Mr Lerette is here today with concerns about the fungus on his nails. He states that he feels like the nails have improved some but they still look bad.   Noted thick discolored nails that have shown some improvement since being treated with laser and topical therapies  Advice from Dr Jacqualyn Posey is as follow: try a different topical, Jublia, follow up in 3 months after using topical. No further lasers are necessary at this time.

## 2017-09-15 DIAGNOSIS — M7138 Other bursal cyst, other site: Secondary | ICD-10-CM | POA: Diagnosis not present

## 2017-09-15 DIAGNOSIS — D229 Melanocytic nevi, unspecified: Secondary | ICD-10-CM | POA: Diagnosis not present

## 2017-10-10 ENCOUNTER — Ambulatory Visit: Payer: Self-pay | Admitting: *Deleted

## 2017-10-10 ENCOUNTER — Ambulatory Visit (INDEPENDENT_AMBULATORY_CARE_PROVIDER_SITE_OTHER): Payer: Medicare Other | Admitting: Family Medicine

## 2017-10-10 ENCOUNTER — Encounter: Payer: Self-pay | Admitting: Family Medicine

## 2017-10-10 ENCOUNTER — Ambulatory Visit (INDEPENDENT_AMBULATORY_CARE_PROVIDER_SITE_OTHER): Payer: Medicare Other

## 2017-10-10 VITALS — BP 130/56 | HR 56 | Temp 99.3°F | Ht 67.0 in | Wt 154.6 lb

## 2017-10-10 DIAGNOSIS — R072 Precordial pain: Secondary | ICD-10-CM | POA: Diagnosis not present

## 2017-10-10 DIAGNOSIS — R079 Chest pain, unspecified: Secondary | ICD-10-CM

## 2017-10-10 DIAGNOSIS — R0789 Other chest pain: Secondary | ICD-10-CM

## 2017-10-10 DIAGNOSIS — S299XXA Unspecified injury of thorax, initial encounter: Secondary | ICD-10-CM | POA: Diagnosis not present

## 2017-10-10 MED ORDER — ACETAMINOPHEN-CODEINE #3 300-30 MG PO TABS: 1.0000 | ORAL_TABLET | ORAL | 0 refills | Status: DC | PRN
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
## 2017-10-10 NOTE — Telephone Encounter (Signed)
Spoke with patient. I scheduled him with Dr. Jerline Pain at 11:20 this morning

## 2017-10-10 NOTE — Patient Instructions (Signed)
It was very nice to see you today!  I do not think you have a significant fracture based on your x-ray, and physical exam today.  We will need to get a CT scan to definitively rule this out.  It is very important that you are able to take deep breaths over the next few days to prevent pneumonia from setting up.  Please use the Tylenol with codeine as needed for this.  If your symptoms worsen, or do not improve over the next several days, please let me or Dr. Yong Channel know.  Take care, Dr Jerline Pain

## 2017-10-14 ENCOUNTER — Telehealth: Payer: Self-pay | Admitting: Radiology

## 2017-10-14 ENCOUNTER — Telehealth: Payer: Self-pay | Admitting: Family Medicine

## 2017-10-14 ENCOUNTER — Other Ambulatory Visit: Payer: Self-pay | Admitting: Family Medicine

## 2017-10-14 ENCOUNTER — Encounter: Payer: Self-pay | Admitting: *Deleted

## 2017-10-14 NOTE — Telephone Encounter (Signed)
Please advise 
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
## 2017-10-14 NOTE — Telephone Encounter (Signed)
Rx sent in. If no improvement over next few days, he should let us know.

## 2017-10-14 NOTE — Telephone Encounter (Signed)
Copied from [place] [id number]. Topic: Referral - Request >> Oct 14, 2017  3:17 PM Arletha Grippe wrote: Reason for CRM: pt said he is still in a bit of pain and would like to have a sternum ct scan.   Please call [phone]

## 2017-10-14 NOTE — Telephone Encounter (Signed)
Can send in more pain meds if he needs it. If not improving some by the end of the week or if worsens, will need to be seen again.  Please send Rx request to me if he wants more.  Algis Greenhouse. Jerline Pain, MD 10/14/2017 1:25 PM

## 2017-10-14 NOTE — Telephone Encounter (Signed)
Spoke with Pilar and does want another 5 days of Tylenol #3.  He also is asking about a CT you mentioned on Friday?

## 2017-10-14 NOTE — Telephone Encounter (Signed)
Please Advise

## 2017-10-14 NOTE — Telephone Encounter (Signed)
See note

## 2017-10-14 NOTE — Telephone Encounter (Signed)
Spoke with Dr. Jerline Pain to let him know Corey Fischer would like another rx for Tylenol#3 and to discuss if you think a CT is needed.

## 2017-10-14 NOTE — Telephone Encounter (Signed)
This encounter was created in error - please disregard.

## 2017-10-14 NOTE — Telephone Encounter (Signed)
Pt called in he would like to have something called in - [facility] [address]

## 2017-10-14 NOTE — Telephone Encounter (Signed)
Can you call radiology and ask if there is order for CT sternum or do I just have to order CT chest noncontrast?  Garret Reddish

## 2017-10-15 ENCOUNTER — Ambulatory Visit (INDEPENDENT_AMBULATORY_CARE_PROVIDER_SITE_OTHER)
Admission: RE | Admit: 2017-10-15 | Discharge: 2017-10-15 | Disposition: A | Discharge status: Home / Self Care | Payer: Medicare Other | Source: Ambulatory Visit | Attending: Family Medicine | Admitting: Family Medicine

## 2017-10-15 ENCOUNTER — Encounter: Payer: Self-pay | Admitting: Physician Assistant

## 2017-10-15 ENCOUNTER — Other Ambulatory Visit: Payer: Self-pay

## 2017-10-15 ENCOUNTER — Ambulatory Visit (INDEPENDENT_AMBULATORY_CARE_PROVIDER_SITE_OTHER): Payer: Medicare Other | Admitting: Physician Assistant

## 2017-10-15 VITALS — BP 150/78 | HR 46 | Temp 97.7°F | Ht 67.0 in | Wt 154.4 lb

## 2017-10-15 DIAGNOSIS — R0789 Other chest pain: Secondary | ICD-10-CM

## 2017-10-15 DIAGNOSIS — R072 Precordial pain: Secondary | ICD-10-CM | POA: Diagnosis not present

## 2017-10-15 DIAGNOSIS — S299XXA Unspecified injury of thorax, initial encounter: Secondary | ICD-10-CM | POA: Diagnosis not present

## 2017-10-15 NOTE — Progress Notes (Signed)
Patient was not seen by provider and appointment was cancelled. No charge. PCP to order CT of chest.  Inda Coke PA-C

## 2017-10-15 NOTE — Patient Instructions (Signed)
You will be contacted about your chest CT scan.

## 2017-10-15 NOTE — Telephone Encounter (Signed)
Order placed as requested.

## 2017-10-16 ENCOUNTER — Encounter: Payer: Self-pay | Admitting: Physician Assistant

## 2017-10-16 ENCOUNTER — Encounter: Payer: Self-pay | Admitting: Family Medicine

## 2017-10-16 DIAGNOSIS — I700 Atherosclerosis of aorta: Secondary | ICD-10-CM | POA: Insufficient documentation

## 2017-10-23 ENCOUNTER — Ambulatory Visit (INDEPENDENT_AMBULATORY_CARE_PROVIDER_SITE_OTHER): Payer: Medicare Other | Admitting: Family Medicine

## 2017-10-23 ENCOUNTER — Encounter: Payer: Self-pay | Admitting: Family Medicine

## 2017-10-23 VITALS — BP 122/66 | HR 52 | Temp 98.2°F | Ht 67.0 in | Wt 151.0 lb

## 2017-10-23 DIAGNOSIS — R0982 Postnasal drip: Secondary | ICD-10-CM | POA: Diagnosis not present

## 2017-10-23 DIAGNOSIS — R091 Pleurisy: Secondary | ICD-10-CM

## 2017-10-23 DIAGNOSIS — S20219S Contusion of unspecified front wall of thorax, sequela: Secondary | ICD-10-CM

## 2017-10-23 MED ORDER — MELOXICAM 15 MG PO TABS: 15.0000 mg | ORAL_TABLET | Freq: Every day | ORAL | 1 refills | Status: DC

## 2017-10-23 MED ORDER — FLUTICASONE PROPIONATE 50 MCG/ACT NA SUSP: 1.0000 | Freq: Every day | NASAL | 6 refills | Status: DC

## 2017-10-23 MED ORDER — GUAIFENESIN-CODEINE 100-10 MG/5ML PO SOLN: 10.0000 mL | Freq: Two times a day (BID) | ORAL | 0 refills | Status: DC | PRN

## 2017-10-23 MED ORDER — CYCLOBENZAPRINE HCL 5 MG PO TABS: 5.0000 mg | ORAL_TABLET | Freq: Three times a day (TID) | ORAL | 1 refills | Status: DC | PRN

## 2017-10-23 MED ORDER — BENZONATATE 200 MG PO CAPS: 200.0000 mg | ORAL_CAPSULE | Freq: Three times a day (TID) | ORAL | 0 refills | Status: DC | PRN

## 2017-10-23 MED ORDER — KETOROLAC TROMETHAMINE 60 MG/2ML IM SOLN
60.0000 mg | Freq: Once | INTRAMUSCULAR | Status: AC
  Administered 2017-10-23: 60 mg via INTRAMUSCULAR
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
## 2017-10-23 NOTE — Patient Instructions (Signed)
1) for your post nasal drip sending in flonase to use. Can use this twice a day, but if causes nose bleeds may have to back down to once daily. Continue zyrtec.   2) for your cough we are going to do tessalon pearls as needed. Can take these up to three times a day. Recommend 1 tsp honey daily as well. Cough syrup with codeine. Can take twice a day as needed.   3) for pleurisy and chest pain: toradol shot today and anti-inflammatory medications. Sending in mobic to take once daily as needed. Can upset stomach so take with food. Drink water as this medicine can affect kidneys. Do not want you on this long term.   4) muscle pain: will try muscle relaxer as needed. Will make you sleepy, so be careful. DO NOT TAKE any of these medications with VALIUM!!!!!  Remember this will take up to 2 months to get better. Be patient and give yourself some grace and time to heal.

## 2017-11-17 ENCOUNTER — Other Ambulatory Visit: Payer: Self-pay | Admitting: Family Medicine

## 2017-12-02 ENCOUNTER — Encounter: Payer: Self-pay | Admitting: Family Medicine

## 2017-12-02 ENCOUNTER — Ambulatory Visit (INDEPENDENT_AMBULATORY_CARE_PROVIDER_SITE_OTHER): Payer: Medicare Other | Admitting: Family Medicine

## 2017-12-02 VITALS — BP 128/78 | HR 48 | Temp 98.3°F | Ht 67.0 in | Wt 155.2 lb

## 2017-12-02 DIAGNOSIS — S40861A Insect bite (nonvenomous) of right upper arm, initial encounter: Secondary | ICD-10-CM

## 2017-12-02 DIAGNOSIS — W57XXXA Bitten or stung by nonvenomous insect and other nonvenomous arthropods, initial encounter: Secondary | ICD-10-CM

## 2017-12-02 DIAGNOSIS — I700 Atherosclerosis of aorta: Secondary | ICD-10-CM

## 2017-12-02 NOTE — Progress Notes (Signed)
Subjective:  Corey Fischer is a 84 y.o. year old very pleasant male patient who presents for/with See problem oriented charting ROS- no fever, chills, headache, nausea, vomiting, fatigue   Past Medical History-  Patient Active Problem List   Diagnosis Date Noted  . Pancreatic cyst 03/28/2015    Priority: High  . Carotid artery stenosis 11/05/2007    Priority: High  . Hyperglycemia 07/06/2014    Priority: Medium  . Symptomatic PVCs     Priority: Medium  . Anxiety state 12/30/2013    Priority: Medium  . Bradycardia 12/10/2012    Priority: Medium  . PAC (premature atrial contraction), symptomatic 10/16/2012    Priority: Medium  . Benign prostatic hyperplasia with nocturia 07/22/2011    Priority: Medium  . THROMBOCYTHEMIA 04/21/2008    Priority: Medium  . Essential hypertension 04/29/2007    Priority: Medium  . Hyperlipidemia 01/22/2007    Priority: Medium  . Osteoarthritis 03/16/2010    Priority: Low  . Osteopenia 07/06/2009    Priority: Low  . Raynaud's syndrome 06/02/2009    Priority: Low  . Prostatitis 03/01/2009    Priority: Low  . CONSTIPATION, CHRONIC 07/28/2008    Priority: Low  . ESOPHAGEAL STRICTURE 03/04/2008    Priority: Low  . COLONIC POLYPS, HX OF 03/04/2008    Priority: Low  . Unspecified visual loss 10/30/2007    Priority: Low  . Vitamin B12 deficiency 01/26/2007    Priority: Low  . GERD 01/22/2007    Priority: Low  . Aortic atherosclerosis ([place]) 10/16/2017  . Generalized abdominal pain 10/18/2015  . Bloating 10/18/2015  . Schatzki's ring     Medications- reviewed and updated Current Outpatient Medications  Medication Sig Dispense Refill  . acetaminophen-codeine (TYLENOL #3) 300-30 MG tablet TAKE 1-2 TABLETS BY MOUTH EVERY 4 HOURS AS NEEDED FOR UP TO 5 DAYS FOR MODERATE PAIN. 30 tablet 0  . amLODipine (NORVASC) 2.5 MG tablet Take 1 tablet (2.5 mg total) at bedtime by mouth. 90 tablet 3  . Ascorbic Acid (VITAMIN C) 1000 MG tablet Take 1,000 mg by  mouth daily.     [name] aspirin 81 MG tablet Take 81 mg by mouth daily.    [name] atenolol (TENORMIN) 25 MG tablet TAKE 1/2 TABLET BY MOUTH ONCE DAILY 45 tablet 6  . atorvastatin (LIPITOR) 20 MG tablet TAKE 1 TABLET BY MOUTH EVERY DAY 90 tablet 1  . benzonatate (TESSALON) 200 MG capsule Take 1 capsule (200 mg total) by mouth 3 (three) times daily as needed for cough. 60 capsule 0  . cetirizine (ZYRTEC) 10 MG tablet Take 10 mg by mouth daily with breakfast.     . Cholecalciferol (VITAMIN D3) 2000 units capsule Take 2,000 Units by mouth daily.    . Coenzyme Q10 50 MG CAPS Take 250 mg by mouth daily.     . Cyanocobalamin (VITAMIN B-12) 2500 MCG SUBL Place under the tongue.     . cyclobenzaprine (FLEXERIL) 5 MG tablet Take 1 tablet (5 mg total) by mouth 3 (three) times daily as needed for muscle spasms. Caution drowsiness 30 tablet 1  . diazepam (VALIUM) 5 MG tablet Take 0.5-1 tablets (2.5-5 mg total) by mouth at bedtime as needed for anxiety (or sleep). 30 tablet 5  . Efinaconazole (JUBLIA) 10 % SOLN Apply 1 drop topically daily. 8 mL 6  . fluticasone (FLONASE) 50 MCG/ACT nasal spray Place 1 spray into both nostrils daily. 16 g 6  . guaiFENesin-codeine 100-10 MG/5ML syrup Take 10 mLs by mouth  2 (two) times daily as needed for cough. As needed for cough. Caution drowsiness 120 mL 0  . hydrocortisone-pramoxine (ANALPRAM-HC) 2.5-1 % rectal cream Place 1 application rectally as needed.     . Magnesium 200 MG TABS Take 200 mg by mouth daily.     . meloxicam (MOBIC) 15 MG tablet Take 1 tablet (15 mg total) by mouth daily. 30 tablet 1  . ondansetron (ZOFRAN ODT) 4 MG disintegrating tablet Place 1 tablet on the tongue every 6 hours as needed for nausea. 30 tablet 1  . pantoprazole (PROTONIX) 40 MG tablet Take 1 tablet (40 mg total) by mouth daily. 90 tablet 1  . polyethylene glycol (MIRALAX / GLYCOLAX) packet Take 17 g by mouth daily.     . Zinc 25 MG TABS Take 25 mg by mouth daily.      No current  facility-administered medications for this visit.     Objective: BP 128/78 (BP Location: Left Arm, Patient Position: Sitting, Cuff Size: Normal)   Pulse (!) 48   Temp 98.3 F (36.8 C) (Oral)   Ht 5\' 7"  (1.702 m)   Wt 155 lb 3.2 oz (70.4 kg)   SpO2 98%   BMI 24.31 kg/m  Gen: NAD, resting comfortably CV: bradycardic no rubs or gallops Lungs: CTAB no crackles, wheeze, rhonchi  Ext: no edema Skin: warm, dry, right forearm with small pustule under 1 mm surrounded by 5 mm erythematous papule at its base. Removed white head and expressed some clear fluid  Assessment/Plan:  Tick bite S: last Thursday or Friday thought he had a small scab- tried to move it around and couldn't get it off- the next day he noted the "scab" was gone. He had a small red area underneath it and has noted slight worsening in redness and slight expansion. Thinks it was attached under 24 hours. He also didn't have a lot of outdoor exposure so not sure if it really was a tick bite. Had Tdap in 2017.  A/P: suspected tick bite- small pustule expressed today overlying it. No signs rmsf or lyme but discussed potential warning signs and return precautions. No need for doxycycline at this point.   Aortic atherosclerosis (HCC) S: we discussed AAA finding A/P: continue risk factor modification already had known carotid artery disease so management does not change/shirt    Future Appointments  Date Time Provider [place]  12/17/2017  2:30 PM [facility] VASC 4 [facility]  12/17/2017  4:00 PM [name], MD [facility]  03/03/2018  9:00 AM [facility] HEALTH COACH [facility]  03/03/2018 10:00 AM [facility] LAB [facility]  03/10/2018  8:30 AM Marin Olp, MD [facility]   Return precautions advised.  Garret Reddish, MD

## 2017-12-02 NOTE — Patient Instructions (Addendum)
If you have flu like symptoms like  Headache, fever, abnormal fatigue please see [place] back. Also if redness were to continue to spread past tomorrow, would want to see you back. You did have a small white head which we removed and got some fluid out but no signs of foreign object and luckily you are already up to date on tetanus shot

## 2017-12-02 NOTE — Assessment & Plan Note (Signed)
S: we discussed AAA finding A/P: continue risk factor modification already had known carotid artery disease so management does not change/shirt

## 2017-12-14 ENCOUNTER — Other Ambulatory Visit: Payer: Self-pay | Admitting: Family Medicine

## 2017-12-15 ENCOUNTER — Other Ambulatory Visit: Payer: Self-pay

## 2017-12-15 NOTE — Telephone Encounter (Signed)
Rx refill for Meloxicam sent to Dr. Rogers Blocker for approval.

## 2017-12-15 NOTE — Progress Notes (Signed)
rx refill for Meloxicam sent to Dr. Rogers Blocker for approval.

## 2017-12-15 NOTE — Progress Notes (Signed)
Sent in

## 2017-12-17 ENCOUNTER — Encounter: Payer: Self-pay | Admitting: Cardiovascular Disease

## 2017-12-17 ENCOUNTER — Ambulatory Visit (HOSPITAL_COMMUNITY)
Admission: RE | Admit: 2017-12-17 | Discharge: 2017-12-17 | Disposition: A | Discharge status: Home / Self Care | Payer: Medicare Other | Source: Ambulatory Visit | Attending: Cardiovascular Disease | Admitting: Cardiovascular Disease

## 2017-12-17 ENCOUNTER — Ambulatory Visit (INDEPENDENT_AMBULATORY_CARE_PROVIDER_SITE_OTHER): Payer: Medicare Other | Admitting: Cardiovascular Disease

## 2017-12-17 VITALS — BP 132/56 | HR 51 | Ht 68.0 in | Wt 155.4 lb

## 2017-12-17 DIAGNOSIS — I6523 Occlusion and stenosis of bilateral carotid arteries: Secondary | ICD-10-CM

## 2017-12-17 DIAGNOSIS — I6529 Occlusion and stenosis of unspecified carotid artery: Secondary | ICD-10-CM

## 2017-12-17 NOTE — Assessment & Plan Note (Signed)
History of essential hypertension blood pressure measured today 132/56.  He is on amlodipine.  Continue current meds at current dosing.
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
## 2017-12-17 NOTE — Assessment & Plan Note (Signed)
History of hyperlipidemia on statin therapy followed performed 02/25/2017 revealing a total cholesterol of 121, LDL 51 and HDL 42.

## 2017-12-17 NOTE — Assessment & Plan Note (Signed)
History of carotid artery disease status post carotid Doppler study performed 12/10/2016 revealing moderate right ICA stenosis.  He did have carotid Dopplers performed today which we will review.

## 2017-12-17 NOTE — Assessment & Plan Note (Signed)
History of aortoiliac atherosclerosis seen on abdominal pelvic CT without symptoms.

## 2017-12-17 NOTE — Patient Instructions (Signed)

## 2017-12-17 NOTE — Assessment & Plan Note (Signed)
Chronic, asymptomatic.

## 2017-12-18 ENCOUNTER — Other Ambulatory Visit: Payer: Self-pay | Admitting: *Deleted

## 2017-12-18 DIAGNOSIS — I6529 Occlusion and stenosis of unspecified carotid artery: Secondary | ICD-10-CM

## 2017-12-22 NOTE — Progress Notes (Signed)
Approved by Dr. Rogers Blocker on separate encounter

## 2017-12-24 ENCOUNTER — Other Ambulatory Visit: Payer: Self-pay | Admitting: Internal Medicine

## 2017-12-29 ENCOUNTER — Telehealth: Payer: Self-pay | Admitting: Internal Medicine

## 2017-12-29 MED ORDER — PANTOPRAZOLE SODIUM 40 MG PO TBEC: 40.0000 mg | DELAYED_RELEASE_TABLET | Freq: Every day | ORAL | 0 refills | Status: DC

## 2017-12-29 NOTE — Telephone Encounter (Signed)
Patient states he needs medication pantoprazole refilled at [facility].

## 2017-12-29 NOTE — Telephone Encounter (Signed)
Patient advised that he needs appt for refills. He has not been seen in 2 years. Patient is scheduled for appt on 02/19/18. I have sent 90 day supply of pantoprazole to patient's pharmacy until appt.

## 2018-02-12 ENCOUNTER — Encounter: Payer: Self-pay | Admitting: *Deleted

## 2018-02-19 ENCOUNTER — Ambulatory Visit (INDEPENDENT_AMBULATORY_CARE_PROVIDER_SITE_OTHER): Payer: Medicare Other | Admitting: Internal Medicine

## 2018-02-19 ENCOUNTER — Encounter: Payer: Self-pay | Admitting: Internal Medicine

## 2018-02-19 VITALS — BP 112/56 | HR 52 | Ht 66.0 in | Wt 152.4 lb

## 2018-02-19 DIAGNOSIS — K449 Diaphragmatic hernia without obstruction or gangrene: Secondary | ICD-10-CM

## 2018-02-19 DIAGNOSIS — I6523 Occlusion and stenosis of bilateral carotid arteries: Secondary | ICD-10-CM

## 2018-02-19 DIAGNOSIS — K5909 Other constipation: Secondary | ICD-10-CM

## 2018-02-19 DIAGNOSIS — K219 Gastro-esophageal reflux disease without esophagitis: Secondary | ICD-10-CM | POA: Diagnosis not present

## 2018-02-19 MED ORDER — PANTOPRAZOLE SODIUM 40 MG PO TBEC: 40.0000 mg | DELAYED_RELEASE_TABLET | Freq: Every day | ORAL | 3 refills | Status: DC

## 2018-02-19 NOTE — Progress Notes (Signed)
Subjective:    Patient ID: Corey Fischer, male    DOB: 03-27-1932, 84 y.o.   MRN: [id number]  HPI Corey Fischer is an 84 year old male with a history of GERD, colon polyps, IBS, possible bacterial overgrowth, duodenal diverticulum, resolved small pancreatic cyst who is here for follow-up.  He was last seen on 05/02/2016 and he presents today with his wife.  He reports that he has been doing very well recently.  He says for the most part his reflux is well controlled with pantoprazole 40 mg daily.  He is not having any dysphagia or odynophagia.  He did require dilation several years ago of his esophagus.  He will very rarely use famotidine in the evening on an as-needed basis for breakthrough heartburn type symptoms.  On 2 occasions in the last few months he is woken from sleep with epigastric discomfort radiating towards his back.  This resolves when he gets up and out of bed/changes position though not changing position in the bed.  Occasionally when he rolls to a different side he will feel some mild nausea.  Bowel movements have been regular with MiraLAX daily.  No blood in his stool or melena.  No further episodes of his abdominal pain in the central abdomen as previously.  The epigastric type pain feels different to him than this all pain.  He inquires about the need for probiotic.  Last EGD 03/22/2015 showing a Schatzki's ring dilated to 18 mm.,  Small hiatal hernia and otherwise normal-appearing stomach and duodenum.  Last colonoscopy performed on 03/22/2015 showed a 5 mm descending colon adenoma which was removed with cold snare.  Mild diverticulosis at the hepatic flexure and internal hemorrhoids.   Review of Systems As per HPI, otherwise negative  Current Medications, Allergies, Past Medical History, Past Surgical History, Family History and Social History were reviewed in [facility] record.     Objective:   Physical Exam BP (!) 112/56   Pulse (!) 52   Ht 5\' 6"   (1.676 m)   Wt 152 lb 6 oz (69.1 kg)   BMI 24.59 kg/m  Constitutional: Well-developed and well-nourished. No distress. HEENT: Normocephalic and atraumatic.  Conjunctivae are normal.  No scleral icterus. Neck: Neck supple. Trachea midline. Cardiovascular: Normal rate, regular rhythm and intact distal pulses. No M/R/G Pulmonary/chest: Effort normal and breath sounds normal. No wheezing, rales or rhonchi. Abdominal: Soft, nontender, nondistended. Bowel sounds active throughout. There are no masses palpable. No hepatosplenomegaly. Extremities: no clubbing, cyanosis, or edema Neurological: Alert and oriented to person place and time. Skin: Skin is warm and dry. Psychiatric: Normal mood and affect. Behavior is normal.      Assessment & Plan:  84 year old male with a history of GERD, colon polyps, IBS, possible bacterial overgrowth, duodenal diverticulum, resolved small pancreatic cyst who is here for follow-up.  1. GERD/epigastric pain --his reflux is mostly controlled with pantoprazole 40 mg daily.  He can continue to use famotidine 20 mg in the evening on an as-needed basis for breakthrough heartburn.  If he continues to have epigastric pain episodes at night I would recommend he use famotidine on a regular basis 20 mg at bedtime to see if the symptoms abate.  If so then they are reflux induced.  If symptoms worsen or do not respond to famotidine he is asked to notify me.  2.  Chronic constipation --well-controlled with MiraLAX, continue MiraLAX 17 g daily  3.  History of duodenal diverticulum --no further abdominal pain or  evidence of duodenal diverticulitis.  Regarding probiotic, I do not think he is having any specific symptoms that would necessarily benefit from probiotic therapy.  That said they are unlikely to be harmful.  He can follow-up in 1 to 2 years, sooner if needed 25 minutes spent with the patient today. Greater than 50% was spent in counseling and coordination of care with  the patient

## 2018-02-19 NOTE — Patient Instructions (Signed)
We have sent the following medications to your pharmacy for you to pick up at your convenience: Pantoprazole 40 mg daily  Please purchase the following medications over the counter and take as directed: Famotidine 20 mg every night as needed  Continue Miralax.  Please follow up with Dr Hilarie Fredrickson in 1-2 years.  If you are age 82 or older, your body mass index should be between 23-30. Your Body mass index is 24.59 kg/m. If this is out of the aforementioned range listed, please consider follow up with your Primary Care Provider.  If you are age 54 or younger, your body mass index should be between 19-25. Your Body mass index is 24.59 kg/m. If this is out of the aformentioned range listed, please consider follow up with your Primary Care Provider.

## 2018-02-23 DIAGNOSIS — H348322 Tributary (branch) retinal vein occlusion, left eye, stable: Secondary | ICD-10-CM | POA: Diagnosis not present

## 2018-02-23 DIAGNOSIS — H43812 Vitreous degeneration, left eye: Secondary | ICD-10-CM | POA: Diagnosis not present

## 2018-02-23 DIAGNOSIS — G43809 Other migraine, not intractable, without status migrainosus: Secondary | ICD-10-CM | POA: Diagnosis not present

## 2018-02-23 DIAGNOSIS — G453 Amaurosis fugax: Secondary | ICD-10-CM | POA: Diagnosis not present

## 2018-03-02 NOTE — Progress Notes (Signed)
PCP notes:   Health maintenance: Flu: Received today.   Abnormal screenings: None.   Patient concerns: Patient states that the tick bite is still itching, he will discuss this at his follow up visit 03/10/18   Nurse concerns: None.   Next PCP appt: 03/10/18

## 2018-03-02 NOTE — Progress Notes (Signed)
Subjective:   Corey Fischer is a 84 y.o. male who presents for Medicare Annual/Subsequent preventive examination.  Lives in two story home with wife. Mostly lives on first floor. Moving to [place] in the spring.  Review of Systems:  No ROS.  Medicare Wellness Visit. Additional risk factors are reflected in the social history.  Sleeps 7-8hrs/night. Feels rested for the most part when he wakes up. Wakes up 2-3x/night. Ocasionally naps in the afternoon.   Cardiac Risk Factors include: male gender;advanced age (>62mn, >>49women);dyslipidemia;hypertension     Objective:    Vitals: BP (!) 110/58 (BP Location: Right Arm, Patient Position: Sitting, Cuff Size: Normal)   Pulse (!) 50   Resp 16   Ht 5' 6"  (1.676 m)   Wt 152 lb (68.9 kg)   SpO2 98%   BMI 24.53 kg/m   Body mass index is 24.53 kg/m.  Advanced Directives 03/03/2018 02/25/2017 03/22/2015 03/19/2015 06/02/2013 05/27/2013 07/16/2011  Does Patient Have a Medical Advance Directive? Yes Yes No No Patient has advance directive, copy not in chart Patient has advance directive, copy not in chart Patient has advance directive, copy not in chart  Type of Advance Directive [place] will Living will;Healthcare Power of [place]  Does patient want to make changes to medical advance directive? No - Patient declined No - Patient declined - - No No -  Copy of Healthcare Power of Attorney in Chart? Yes No - copy requested - - Copy requested from other (Comment) - -  Would patient like information on creating a medical advance directive? - - - No - patient declined information - - -    Tobacco Social History   Tobacco Use  Smoking Status Never Smoker  Smokeless Tobacco Never Used     Counseling given: Not Answered  Past Medical History:  Diagnosis Date  . Abdominal pain 03/19/2015  . Allergy   . Anxiety   . Arthritis     . Asymptomatic bilateral carotid artery stenosis    a. Last duplex 11/2013 R bulb and ICA - 50-69%, R ECA >50%. L bulb and prox ICA: 0-49%. Repeat due 11/2014.  .Marland KitchenB12 deficiency   . Bilateral carotid artery disease ([place]   . BPH (benign prostatic hyperplasia)   . Chronic ITP (idiopathic thrombocytopenia) (HCC)   . Depression   . Diverticulosis   . Elevated PSA   . Gastric polyp   . Gastritis   . GERD (gastroesophageal reflux disease)   . H/O hiatal hernia   . Hemorrhoids   . History of hiatal hernia   . Hx of adenomatous colonic polyps 2010  . Hyperlipidemia   . Hypertension   . INTERNAL HEMORRHOIDS WITHOUT MENTION COMP 08/14/2007   Qualifier: Diagnosis of  By: JArnoldo MoraleMD, John E   . Osteoarthritis hips  . PAC (premature atrial contraction)    a. NSR/SB with 1st degree AVB and PACs/atrial bigeminy seen on event monitor 11/2012.  .Marland KitchenPalpitations   . Raynaud's syndrome   . Schatzki's ring   . Sinus bradycardia   . Stricture and stenosis of esophagus 2010   last endoscopy- 2010  . Symptomatic PVCs    a. Per report, monitor 05/2012 - NSR with symptomatic PVC. b. Echo 05/2012- EF 55-60%, aortic sclerosis without stenosis. c. Nuc 12/2012 - normal.  . Tubular adenoma of colon   . Wandering (atrial) pacemaker    Past Surgical  History:  Procedure Laterality Date  . APPENDECTOMY  age 71  . CARDIAC CATHETERIZATION  1989   Told that he is "pristine "  . CAROTID DUPLEX Bilateral 05-26-2007  . CHOLECYSTECTOMY  1989  . COLONOSCOPY WITH PROPOFOL N/A 03/22/2015   Procedure: COLONOSCOPY WITH PROPOFOL;  Surgeon: Jerene Bears, MD;  Location: [facility];  Service: Endoscopy;  Laterality: N/A;  . ESOPHAGOGASTRODUODENOSCOPY (EGD) WITH PROPOFOL N/A 03/22/2015   Procedure: ESOPHAGOGASTRODUODENOSCOPY (EGD) WITH PROPOFOL;  Surgeon: Jerene Bears, MD;  Location: [facility];  Service: Endoscopy;  Laterality: N/A;  . LAPAROSCOPIC INGUINAL HERNIA REPAIR Bilateral 09-19-2010  . LEFT SHOULDER SURG.  2006  .  ROTATOR CUFF REPAIR Right 1987  . TONSILLECTOMY  age 85  . TOTAL HIP ARTHROPLASTY Left 05/31/2013   Procedure: TOTAL HIP ARTHROPLASTY;  Surgeon: Kerin Salen, MD;  Location: [facility];  Service: Orthopedics;  Laterality: Left;  . TRANSURETHRAL RESECTION OF PROSTATE  07/22/2011   Procedure: TRANSURETHRAL RESECTION OF THE PROSTATE WITH GYRUS INSTRUMENTS;  Surgeon: Bernestine Amass, MD;  Location: [facility];  Service: Urology;  Laterality: N/A;  Saline Gyrus TURP    Family History  Problem Relation Age of Onset  . Heart disease Mother   . Colon polyps Mother   . Heart disease Father   . Angina Father   . Cancer Maternal Grandmother   . Heart failure Maternal Grandfather   . Colon cancer Neg Hx    Social History   Socioeconomic History  . Marital status: Married    Spouse name: Not on file  . Number of children: 2  . Years of education: Not on file  . Highest education level: Not on file  Occupational History  . Occupation: retired    Fish farm manager: RETIRED  Social Needs  . Financial resource strain: Not on file  . Food insecurity:    Worry: Not on file    Inability: Not on file  . Transportation needs:    Medical: Not on file    Non-medical: Not on file  Tobacco Use  . Smoking status: Never Smoker  . Smokeless tobacco: Never Used  Substance and Sexual Activity  . Alcohol use: Yes    Comment: occasionally  . Drug use: No  . Sexual activity: Yes  Lifestyle  . Physical activity:    Days per week: Not on file    Minutes per session: Not on file  . Stress: Not on file  Relationships  . Social connections:    Talks on phone: Not on file    Gets together: Not on file    Attends religious service: Not on file    Active member of club or organization: Not on file    Attends meetings of clubs or organizations: Not on file    Relationship status: Not on file  Other Topics Concern  . Not on file  Social History Narrative   Grew up in small town in [place]. 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 Met wife in [place]. At 60, got married and went to college-undergrad in [employer] as well as Oceanographer, doctorate in Location manager.  Worked at [employer] which eventually became [employer]. Lived in [place] and [place] before eventually moving to [place]. 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       Married over 50 years. 55 years 01/2014. 2 married children with 2 children each. 1 son near [place]. 1 son in [place] with house  of rep.       Hobbies: work out most mornings 5 days a week (spin, elipitical), racquetball until 2014 after hip surgery.     Outpatient Encounter Medications as of 03/03/2018  Medication Sig  . acetaminophen-codeine (TYLENOL #3) 300-30 MG tablet TAKE 1-2 TABLETS BY MOUTH EVERY 4 HOURS AS NEEDED FOR UP TO 5 DAYS FOR MODERATE PAIN.  [name] amLODipine (NORVASC) 2.5 MG tablet Take 1 tablet (2.5 mg total) at bedtime by mouth.  . Ascorbic Acid (VITAMIN C) 1000 MG tablet Take 1,000 mg by mouth daily.   [name] aspirin 81 MG tablet Take 81 mg by mouth daily.  [name] atenolol (TENORMIN) 25 MG tablet TAKE 1/2 TABLET BY MOUTH ONCE DAILY  . atorvastatin (LIPITOR) 20 MG tablet TAKE 1 TABLET BY MOUTH EVERY DAY  . cetirizine (ZYRTEC) 10 MG tablet Take 10 mg by mouth daily with breakfast.   . Cholecalciferol (VITAMIN D3) 2000 units capsule Take 2,000 Units by mouth daily.  . Coenzyme Q10 50 MG CAPS Take 250 mg by mouth daily.   . Cyanocobalamin (VITAMIN B-12) 2500 MCG SUBL Place under the tongue.   . diazepam (VALIUM) 5 MG tablet Take 0.5-1 tablets (2.5-5 mg total) by mouth at bedtime as needed for anxiety (or sleep).  . hydrocortisone-pramoxine (ANALPRAM-HC) 2.5-1 % rectal cream Place 1 application rectally as needed.   . Magnesium 200 MG TABS Take 200 mg by mouth daily.   . ondansetron (ZOFRAN ODT) 4 MG disintegrating tablet Place 1 tablet on the tongue every 6 hours as needed for nausea.  . pantoprazole (PROTONIX) 40 MG tablet Take 1  tablet (40 mg total) by mouth daily.  . polyethylene glycol (MIRALAX / GLYCOLAX) packet Take 17 g by mouth daily.   . Zinc 25 MG TABS Take 25 mg by mouth daily.    No facility-administered encounter medications on file as of 03/03/2018.     Activities of Daily Living In your present state of health, do you have any difficulty performing the following activities: 03/03/2018  Hearing? N  Vision? N  Difficulty concentrating or making decisions? N  Walking or climbing stairs? N  Dressing or bathing? N  Doing errands, shopping? N  Preparing Food and eating ? N  Using the Toilet? N  In the past six months, have you accidently leaked urine? N  Do you have problems with loss of bowel control? N  Managing your Medications? N  Managing your Finances? N  Housekeeping or managing your Housekeeping? N  Some recent data might be hidden    Patient Care Team: Marin Olp, MD as PCP - General (Family Medicine) Zadie Rhine Clent Demark, MD as Consulting Physician (Ophthalmology) Monna Fam, MD as Consulting Physician (Ophthalmology) Trula Slade, DPM as Consulting Physician (Podiatry) Gwenlyn Found Pearletha Forge, MD as Consulting Physician (Cardiology) Mcarthur Rossetti, MD as Consulting Physician (Orthopedic Surgery) Pyrtle, Lajuan Lines, MD as Consulting Physician (Gastroenterology) Frederik Pear, MD as Consulting Physician (Orthopedic Surgery)   Assessment:   This is a routine wellness examination for Corey Fischer.  Exercise Activities and Dietary recommendations Current Exercise Habits: Home exercise routine, Type of exercise: strength training/weights;yoga;Other - see comments;walking;stretching(cardio), Time (Minutes): 60, Frequency (Times/Week): 5, Weekly Exercise (Minutes/Week): 300, Intensity: Moderate, Exercise limited by: None identified   States he eats frequently. Tries to cut back on sweets. States he does not drink enough water. Ocasional root beer, never more then 1 per day. Does not drink  coffee. Limits chocolate due to caffeine.   Goals    .  Maintain current health status        Fall Risk Fall Risk  03/03/2018 02/25/2017 02/25/2017 01/19/2016 01/09/2016  Falls in the past year? Yes No No No No  Comment - - - Emmi Telephone Survey: data to providers prior to load -  Number falls in past yr: 2 or more - - - -  Injury with Fall? No - - - -  Risk Factor Category  High Fall Risk - - - -  Risk for fall due to : History of fall(s) - - - -  Follow up Education provided - - - -   Depression Screen PHQ 2/9 Scores 03/03/2018 02/25/2017 02/25/2017 01/09/2016  PHQ - 2 Score 0 0 0 0  PHQ- 9 Score 0 - - -    Cognitive Function Ad8 score reviewed for issues:  Issues making decisions:no  Less interest in hobbies / activities:no  Repeats questions, stories (family complaining):no  Trouble using ordinary gadgets (microwave, computer, phone):  Forgets the month or year: no  Mismanaging finances: no  Remembering appts:no  Daily problems with thinking and/or memory:no Ad8 score is=0 Completes crossword puzzles ocasionally. States his short term memory has been declining over the past few years. We discussed introducing new brain stimulating activities as well as being present and trying to remember information more actively.         Immunization History  Administered Date(s) Administered  . H1N1 05/03/2008  . Influenza Split 02/23/2013  . Influenza Whole 05/20/2002, 03/01/2009  . Influenza, High Dose Seasonal PF 02/28/2016, 02/25/2017, 03/03/2018  . Influenza,inj,Quad PF,6+ Mos 02/24/2014, 02/28/2015  . Pneumococcal Conjugate-13 05/07/2013  . Pneumococcal Polysaccharide-23 05/20/2005  . Td 05/20/2005  . Tdap 01/09/2016  . Zoster 05/21/2011  . Zoster Recombinat (Shingrix) 03/25/2017, 05/30/2017   Screening Tests Health Maintenance  Topic Date Due  . INFLUENZA VACCINE  12/18/2017  . TETANUS/TDAP  01/08/2026  . PNA vac Low Risk Adult  Completed       Plan:    Follow up with PCP as directed.  I have personally reviewed and noted the following in the patient's chart:   . Medical and social history . Use of alcohol, tobacco or illicit drugs  . Current medications and supplements . Functional ability and status . Nutritional status . Physical activity . Advanced directives . List of other physicians . Vitals . Screenings to include cognitive, depression, and falls . Referrals and appointments  In addition, I have reviewed and discussed with patient certain preventive protocols, quality metrics, and best practice recommendations. A written personalized care plan for preventive services as well as general preventive health recommendations were provided to patient.     Williemae Area, RN  03/03/2018

## 2018-03-03 ENCOUNTER — Encounter: Payer: Self-pay | Admitting: Family Medicine

## 2018-03-03 ENCOUNTER — Other Ambulatory Visit: Payer: Medicare Other

## 2018-03-03 ENCOUNTER — Ambulatory Visit (INDEPENDENT_AMBULATORY_CARE_PROVIDER_SITE_OTHER): Payer: Medicare Other | Admitting: *Deleted

## 2018-03-03 VITALS — BP 110/58 | HR 50 | Resp 16 | Ht 66.0 in | Wt 152.0 lb

## 2018-03-03 DIAGNOSIS — Z23 Encounter for immunization: Secondary | ICD-10-CM | POA: Diagnosis not present

## 2018-03-03 DIAGNOSIS — Z0000 Encounter for general adult medical examination without abnormal findings: Secondary | ICD-10-CM

## 2018-03-03 NOTE — Patient Instructions (Addendum)
Corey Fischer , Thank you for taking time to come for your Medicare Wellness Visit. I appreciate your ongoing commitment to your health goals. Please review the following plan we discussed and let me know if I can assist you in the future.   These are the goals we discussed: Goals    . Maintain current health status        This is a list of the screening recommended for you and due dates:  Health Maintenance  Topic Date Due  . Flu Shot  12/18/2017  . Tetanus Vaccine  01/08/2026  . Pneumonia vaccines  Completed   Preventive Care for Adults  A healthy lifestyle and preventive care can promote health and wellness. Preventive health guidelines for adults include the following key practices.  . A routine yearly physical is a good way to check with your health care provider about your health and preventive screening. It is a chance to share any concerns and updates on your health and to receive a thorough exam.  . Visit your dentist for a routine exam and preventive care every 6 months. Brush your teeth twice a day and floss once a day. Good oral hygiene prevents tooth decay and gum disease.  . The frequency of eye exams is based on your age, health, family medical history, use  of contact lenses, and other factors. Follow your health care provider's recommendations for frequency of eye exams.  . Eat a healthy diet. Foods like vegetables, fruits, whole grains, low-fat dairy products, and lean protein foods contain the nutrients you need without too many calories. Decrease your intake of foods high in solid fats, added sugars, and salt. Eat the right amount of calories for you. Get information about a proper diet from your health care provider, if necessary.  . Regular physical exercise is one of the most important things you can do for your health. Most adults should get at least 150 minutes of moderate-intensity exercise (any activity that increases your heart rate and causes you to sweat) each  week. In addition, most adults need muscle-strengthening exercises on 2 or more days a week.  Silver Sneakers may be a benefit available to you. To determine eligibility, you may visit the website: www.silversneakers.com or contact program at (216)147-9619 Mon-Fri between 8AM-8PM.   . Maintain a healthy weight. The body mass index (BMI) is a screening tool to identify possible weight problems. It provides an estimate of body fat based on height and weight. Your health care provider can find your BMI and can help you achieve or maintain a healthy weight.   For adults 20 years and older: ? A BMI below 18.5 is considered underweight. ? A BMI of 18.5 to 24.9 is normal. ? A BMI of 25 to 29.9 is considered overweight. ? A BMI of 30 and above is considered obese.   . Maintain normal blood lipids and cholesterol levels by exercising and minimizing your intake of saturated fat. Eat a balanced diet with plenty of fruit and vegetables. Blood tests for lipids and cholesterol should begin at age 86 and be repeated every 5 years. If your lipid or cholesterol levels are high, you are over 50, or you are at high risk for heart disease, you may need your cholesterol levels checked more frequently. Ongoing high lipid and cholesterol levels should be treated with medicines if diet and exercise are not working.  . If you smoke, find out from your health care provider how to quit. If you do  not use tobacco, please do not start.  . If you choose to drink alcohol, please do not consume more than 2 drinks per day. One drink is considered to be 12 ounces (355 mL) of beer, 5 ounces (148 mL) of wine, or 1.5 ounces (44 mL) of liquor.  . If you are 55-62 years old, ask your health care provider if you should take aspirin to prevent strokes.  . Use sunscreen. Apply sunscreen liberally and repeatedly throughout the day. You should seek shade when your shadow is shorter than you. Protect yourself by wearing long sleeves,  pants, a wide-brimmed hat, and sunglasses year round, whenever you are outdoors.  . Once a month, do a whole body skin exam, using a mirror to look at the skin on your back. Tell your health care provider of new moles, moles that have irregular borders, moles that are larger than a pencil eraser, or moles that have changed in shape or color.

## 2018-03-03 NOTE — Progress Notes (Signed)
I have reviewed and agree with note, evaluation, plan.  Discussed itching at site of prior tick bite at follow-up.  Garret Reddish, MD

## 2018-03-10 ENCOUNTER — Other Ambulatory Visit: Payer: Self-pay | Admitting: Family Medicine

## 2018-03-10 ENCOUNTER — Encounter: Payer: Self-pay | Admitting: Family Medicine

## 2018-03-10 ENCOUNTER — Ambulatory Visit (INDEPENDENT_AMBULATORY_CARE_PROVIDER_SITE_OTHER): Payer: Medicare Other | Admitting: Family Medicine

## 2018-03-10 VITALS — BP 130/52 | HR 43 | Temp 97.7°F | Ht 66.0 in | Wt 150.8 lb

## 2018-03-10 DIAGNOSIS — N401 Enlarged prostate with lower urinary tract symptoms: Secondary | ICD-10-CM

## 2018-03-10 DIAGNOSIS — I491 Atrial premature depolarization: Secondary | ICD-10-CM

## 2018-03-10 DIAGNOSIS — R739 Hyperglycemia, unspecified: Secondary | ICD-10-CM | POA: Diagnosis not present

## 2018-03-10 DIAGNOSIS — E785 Hyperlipidemia, unspecified: Secondary | ICD-10-CM | POA: Diagnosis not present

## 2018-03-10 DIAGNOSIS — R351 Nocturia: Secondary | ICD-10-CM

## 2018-03-10 DIAGNOSIS — I10 Essential (primary) hypertension: Secondary | ICD-10-CM

## 2018-03-10 DIAGNOSIS — D696 Thrombocytopenia, unspecified: Secondary | ICD-10-CM | POA: Diagnosis not present

## 2018-03-10 DIAGNOSIS — I6523 Occlusion and stenosis of bilateral carotid arteries: Secondary | ICD-10-CM

## 2018-03-10 DIAGNOSIS — E538 Deficiency of other specified B group vitamins: Secondary | ICD-10-CM

## 2018-03-10 LAB — LIPID PANEL
CHOLESTEROL: 121 mg/dL (ref 0–200)
HDL: 42.3 mg/dL (ref >39.00)
LDL CALC: 56 mg/dL (ref 0–99)
NONHDL: 78.45
Total CHOL/HDL Ratio: 3
Triglycerides: 113 mg/dL (ref 0.0–149.0)
VLDL: 22.6 mg/dL (ref 0.0–40.0)

## 2018-03-10 LAB — CBC
HCT: 45.5 % (ref 39.0–52.0)
Hemoglobin: 15.6 g/dL (ref 13.0–17.0)
MCHC: 34.2 g/dL (ref 30.0–36.0)
MCV: 90 fl (ref 78.0–100.0)
Platelets: 144 10*3/uL — ABNORMAL LOW (ref 150.0–400.0)
RBC: 5.05 Mil/uL (ref 4.22–5.81)
RDW: 13.1 % (ref 11.5–15.5)
WBC: 5.3 10*3/uL (ref 4.0–10.5)

## 2018-03-10 LAB — COMPREHENSIVE METABOLIC PANEL
ALBUMIN: 4.5 g/dL (ref 3.5–5.2)
ALT: 18 U/L (ref 0–53)
AST: 14 U/L (ref 0–37)
Alkaline Phosphatase: 64 U/L (ref 39–117)
BUN: 18 mg/dL (ref 6–23)
CHLORIDE: 102 meq/L (ref 96–112)
CO2: 31 mEq/L (ref 19–32)
Calcium: 9.7 mg/dL (ref 8.4–10.5)
Creatinine, Ser: 1.18 mg/dL (ref 0.40–1.50)
GFR: 62.49 mL/min (ref >60.00)
GLUCOSE: 100 mg/dL — AB (ref 70–99)
Potassium: 4.6 mEq/L (ref 3.5–5.1)
SODIUM: 138 meq/L (ref 135–145)
Total Bilirubin: 0.9 mg/dL (ref 0.2–1.2)
Total Protein: 6.8 g/dL (ref 6.0–8.3)

## 2018-03-10 LAB — VITAMIN B12: VITAMIN B 12: 1263 pg/mL — AB (ref 211–911)

## 2018-03-10 LAB — HEMOGLOBIN A1C: HEMOGLOBIN A1C: 5.9 % (ref 4.6–6.5)

## 2018-03-10 LAB — TSH: TSH: 1.58 u[IU]/mL (ref 0.35–4.50)

## 2018-03-10 NOTE — Assessment & Plan Note (Signed)
S: currently on b12 2500 mcg daily- had gone to 1000 at some point then wen tback up Lab Results  Component Value Date   VITAMINB12 853 02/25/2017  A/P: update b12 level, suspect can drop down on dose

## 2018-03-10 NOTE — Progress Notes (Signed)
Phone: [phone]  Subjective:  Patient presents today for his yearly exam. Chief complaint-noted.   See problem oriented charting- ROS- full  review of systems was completed and negative except for: drooling, post nasal drip, runny nose, palpitations, urinary urgency, cold intolerance, joint pain, seasonal allergies  The following were reviewed and entered/updated in epic: Past Medical History:  Diagnosis Date  . Abdominal pain 03/19/2015  . Allergy   . Anxiety   . Arthritis   . Asymptomatic bilateral carotid artery stenosis    a. Last duplex 11/2013 R bulb and ICA - 50-69%, R ECA >50%. L bulb and prox ICA: 0-49%. Repeat due 11/2014.  [name] B12 deficiency   . Bilateral carotid artery disease ([place])   . BPH (benign prostatic hyperplasia)   . Chronic ITP (idiopathic thrombocytopenia) (HCC)   . Depression   . Diverticulosis   . Elevated PSA   . Gastric polyp   . Gastritis   . GERD (gastroesophageal reflux disease)   . H/O hiatal hernia   . Hemorrhoids   . History of hiatal hernia   . Hx of adenomatous colonic polyps 2010  . Hyperlipidemia   . Hypertension   . INTERNAL HEMORRHOIDS WITHOUT MENTION COMP 08/14/2007   Qualifier: Diagnosis of  By: Arnoldo Morale MD, John E   . Osteoarthritis hips  . PAC (premature atrial contraction)    a. NSR/SB with 1st degree AVB and PACs/atrial bigeminy seen on event monitor 11/2012.  [name] Palpitations   . Raynaud's syndrome   . Schatzki's ring   . Sinus bradycardia   . Stricture and stenosis of esophagus 2010   last endoscopy- 2010  . Symptomatic PVCs    a. Per report, monitor 05/2012 - NSR with symptomatic PVC. b. Echo 05/2012- EF 55-60%, aortic sclerosis without stenosis. c. Nuc 12/2012 - normal.  . Tubular adenoma of colon   . Wandering (atrial) pacemaker    Patient Active Problem List   Diagnosis Date Noted  . Pancreatic cyst 03/28/2015    Priority: High  . Carotid artery stenosis 11/05/2007    Priority: High  . Hyperglycemia 07/06/2014   Priority: Medium  . Symptomatic PVCs     Priority: Medium  . Anxiety state 12/30/2013    Priority: Medium  . Bradycardia 12/10/2012    Priority: Medium  . PAC (premature atrial contraction), symptomatic 10/16/2012    Priority: Medium  . Benign prostatic hyperplasia with nocturia 07/22/2011    Priority: Medium  . Thrombocytopenia ([place]) 04/21/2008    Priority: Medium  . Essential hypertension 04/29/2007    Priority: Medium  . Hyperlipidemia 01/22/2007    Priority: Medium  . Osteoarthritis 03/16/2010    Priority: Low  . Osteopenia 07/06/2009    Priority: Low  . Raynaud's syndrome 06/02/2009    Priority: Low  . Prostatitis 03/01/2009    Priority: Low  . CONSTIPATION, CHRONIC 07/28/2008    Priority: Low  . ESOPHAGEAL STRICTURE 03/04/2008    Priority: Low  . COLONIC POLYPS, HX OF 03/04/2008    Priority: Low  . Unspecified visual loss 10/30/2007    Priority: Low  . Vitamin B12 deficiency 01/26/2007    Priority: Low  . GERD 01/22/2007    Priority: Low  . Aortic atherosclerosis ([place]) 10/16/2017  . Generalized abdominal pain 10/18/2015  . Bloating 10/18/2015  . Schatzki's ring    Past Surgical History:  Procedure Laterality Date  . APPENDECTOMY  age 74  . CARDIAC CATHETERIZATION  1989   Told that he is "pristine "  .  CAROTID DUPLEX Bilateral 05-26-2007  . CHOLECYSTECTOMY  1989  . COLONOSCOPY WITH PROPOFOL N/A 03/22/2015   Procedure: COLONOSCOPY WITH PROPOFOL;  Surgeon: Jerene Bears, MD;  Location: [facility];  Service: Endoscopy;  Laterality: N/A;  . ESOPHAGOGASTRODUODENOSCOPY (EGD) WITH PROPOFOL N/A 03/22/2015   Procedure: ESOPHAGOGASTRODUODENOSCOPY (EGD) WITH PROPOFOL;  Surgeon: Jerene Bears, MD;  Location: [facility];  Service: Endoscopy;  Laterality: N/A;  . LAPAROSCOPIC INGUINAL HERNIA REPAIR Bilateral 09-19-2010  . LEFT SHOULDER SURG.  2006  . ROTATOR CUFF REPAIR Right 1987  . TONSILLECTOMY  age 26  . TOTAL HIP ARTHROPLASTY Left 05/31/2013   Procedure: TOTAL HIP  ARTHROPLASTY;  Surgeon: Kerin Salen, MD;  Location: [facility];  Service: Orthopedics;  Laterality: Left;  . TRANSURETHRAL RESECTION OF PROSTATE  07/22/2011   Procedure: TRANSURETHRAL RESECTION OF THE PROSTATE WITH GYRUS INSTRUMENTS;  Surgeon: Bernestine Amass, MD;  Location: [facility];  Service: Urology;  Laterality: N/A;  Saline Gyrus TURP     Family History  Problem Relation Age of Onset  . Heart disease Mother   . Colon polyps Mother   . Heart disease Father   . Angina Father   . Cancer Maternal Grandmother   . Heart failure Maternal Grandfather   . Colon cancer Neg Hx     Medications- reviewed and updated Current Outpatient Medications  Medication Sig Dispense Refill  . amLODipine (NORVASC) 2.5 MG tablet Take 1 tablet (2.5 mg total) at bedtime by mouth. 90 tablet 3  . Ascorbic Acid (VITAMIN C) 1000 MG tablet Take 1,000 mg by mouth daily.     [name] aspirin 81 MG tablet Take 81 mg by mouth daily.    [name] atenolol (TENORMIN) 25 MG tablet TAKE 1/2 TABLET BY MOUTH ONCE DAILY 45 tablet 6  . atorvastatin (LIPITOR) 20 MG tablet TAKE 1 TABLET BY MOUTH EVERY DAY 90 tablet 1  . cetirizine (ZYRTEC) 10 MG tablet Take 10 mg by mouth daily with breakfast.     . Cholecalciferol (VITAMIN D3) 2000 units capsule Take 2,000 Units by mouth daily.    . Coenzyme Q10 50 MG CAPS Take 250 mg by mouth daily.     . Cyanocobalamin (VITAMIN B-12) 2500 MCG SUBL Place under the tongue.     . diazepam (VALIUM) 5 MG tablet Take 0.5-1 tablets (2.5-5 mg total) by mouth at bedtime as needed for anxiety (or sleep). 30 tablet 5  . Magnesium 200 MG TABS Take 200 mg by mouth daily.     . pantoprazole (PROTONIX) 40 MG tablet Take 1 tablet (40 mg total) by mouth daily. 90 tablet 3  . polyethylene glycol (MIRALAX / GLYCOLAX) packet Take 17 g by mouth daily.     . Zinc 25 MG TABS Take 25 mg by mouth daily.      No current facility-administered medications for this visit.     Allergies-reviewed and  updated Allergies  Allergen Reactions  . Ciprofloxacin Other (See Comments)    Unknown reaction    Social History   Social History Narrative   Grew up in [place] town in [place]. 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 Met wife in [place]. At 71, got married and went to college-undergrad in [employer] as well as Oceanographer, doctorate in Location manager.  Worked at [employer] which eventually became [employer]. Lived in [facility] [place] and [place] before eventually moving to [place]. 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       Married  over 50 years. 49 years 01/2014. 2 married children with 2 children each. 1 son near [place]. 1 son in [place] with house of rep.       Hobbies: work out most mornings 5 days a week (spin, elipitical), racquetball until 2014 after hip surgery.     Objective: BP (!) 130/52 (BP Location: Left Arm, Patient Position: Sitting, Cuff Size: Large)   Pulse (!) 43   Temp 97.7 F (36.5 C) (Oral)   Ht 5' 6"  (1.676 m)   Wt 150 lb 12.8 oz (68.4 kg)   SpO2 95%   BMI 24.34 kg/m  Gen: NAD, resting comfortably HEENT: Mucous membranes are moist. Oropharynx normal Neck: no thyromegaly CV: bradycardic but regular no murmurs rubs or gallops Lungs: CTAB no crackles, wheeze, rhonchi Abdomen: soft/nontender/nondistended/normal bowel sounds. No rebound or guarding.  Ext: no edema Skin: warm, dry Neuro: grossly normal, moves all extremities, PERRLA Rectal: normal tone, diffusely enlarged prostate, no masses or tenderness  Assessment/Plan:  84 y.o. male presenting for yearly check up.  Health Maintenance counseling: 1.  Risk factor reduction:  Advised patient of need for regular exercise and diet rich and fruits and vegetables to reduce risk of heart attack and stroke. Exercise- most days of the week. Diet-reasonable diet- except admits to sweet addiction.  Wt Readings from Last 3 Encounters:  03/10/18 150 lb 12.8 oz (68.4 kg)  03/03/18 152 lb (68.9  kg)  02/19/18 152 lb 6 oz (69.1 kg)  2. Immunizations/screenings/ancillary studies- up to date Immunization History  Administered Date(s) Administered  . H1N1 05/03/2008  . Influenza Split 02/23/2013  . Influenza Whole 05/20/2002, 03/01/2009  . Influenza, High Dose Seasonal PF 02/28/2016, 02/25/2017, 03/03/2018  . Influenza,inj,Quad PF,6+ Mos 02/24/2014, 02/28/2015  . Pneumococcal Conjugate-13 05/07/2013  . Pneumococcal Polysaccharide-23 05/20/2005  . Td 05/20/2005  . Tdap 01/09/2016  . Zoster 05/21/2011  . Zoster Recombinat (Shingrix) 03/25/2017, 05/30/2017  3. Skin cancer screening- saw dermatology this summer- advised regular sunscreen use. Denies worrisome, changing, or new skin lesions.  4. Prostate cancer screening- urology has recommended no further PSA testing and if continued to use PSA in 10-20 range. PSA back to 2001 was apparently over 4 and he had negative biopsy at that time Lab Results  Component Value Date   PSA 5.68 (H) 03/31/2017   PSA 3.66 02/25/2017   PSA 2.85 01/09/2016  5. Colon cancer screening - passed age based screening  Status of chronic or acute concerns   Other notes: 1.seeing GI Dr. Hilarie Fredrickson for GERD on pantoprazole 24m. For constipation he is on miralax.   2. Sees cardiology with Dr. BGwenlyn Foundwho did not recommend any changes in regards to bradycardia, lipids, carotid artery stenosis, aortic atherosclerosis 3. Tick bite site still itching from July- just stopped itching in last few weeks 4. Very very rare use of diazepam for sleep 5. [facility] 6. Hands and feet cold, losing some air on ankles- will check TSH   Hyperlipidemia S:  controlled on atorvastatin 219mLab Results  Component Value Date   CHOL 121 02/25/2017   HDL 42.60 02/25/2017   LDLCALC 51 02/25/2017   LDLDIRECT 84.9 03/01/2009   TRIG 137.0 02/25/2017   CHOLHDL 3 02/25/2017   A/P: update lipids  PAC (premature atrial  contraction), symptomatic Continue atenolol- doing reasonably well. Likely source of palpitations. HR low but cardiology has not suggested lower or different dose  Thrombocytopenia (HCC) Idiopathic. Had been followed by hematology. No  longer following. Asymptomatic.update CBC    Essential hypertension S: controlled on  amlodipine 2.5 mg and atenolol 12.70m. Despite low HR- cardiology has not recommended reducing dose. Ranges from low into 102 to 140s at home.  BP Readings from Last 3 Encounters:  03/10/18 (!) 130/52  03/03/18 (!) 110/58  02/19/18 (!) 112/56  A/P: We discussed blood pressure goal of <140/90. Continue current meds  Benign prostatic hyperplasia with nocturia Noted on exam today. Urology recommended no further PSA. He wants to get another opinion about [facility]- he will keep me updated if they are ok with PSA  Hyperglycemia S: at risk for diabetes based on prior a1c Lab Results  Component Value Date   HGBA1C 6.1 02/25/2017   HGBA1C 5.9 01/09/2016   HGBA1C 5.9 01/13/2015   A/P: update a1c today, encouraged cutting down on sweet tooth   Vitamin B12 deficiency S: currently on b12 2500 mcg daily- had gone to 1000 at some point then wen tback up Lab Results  Component Value Date   VITAMINB12 853 02/25/2017  A/P: update b12 level, suspect can drop down on dose   Future Appointments  Date Time Provider [place] 03/12/2019 10:00 AM [facility] CO[facility]   6 month advised  Lab/Order associations: FASTING Hyperlipidemia, unspecified hyperlipidemia type - Plan: CBC, Comprehensive metabolic panel, Lipid panel, TSH  Hyperglycemia - Plan: Hemoglobin A1c  Vitamin B12 deficiency - Plan: Vitamin B12  PAC (premature atrial contraction), symptomatic  Thrombocytopenia (HCC)  Essential hypertension  Benign prostatic hyperplasia with nocturia  Return precautions advised.  SGarret Reddish MD

## 2018-03-10 NOTE — Assessment & Plan Note (Signed)
Idiopathic. Had been followed by hematology. No longer following. Asymptomatic.update CBC

## 2018-03-10 NOTE — Assessment & Plan Note (Signed)
S: at risk for diabetes based on prior a1c Lab Results  Component Value Date   HGBA1C 6.1 02/25/2017   HGBA1C 5.9 01/09/2016   HGBA1C 5.9 01/13/2015   A/P: update a1c today, encouraged cutting down on sweet tooth

## 2018-03-10 NOTE — Patient Instructions (Addendum)
Please stop by lab before you go  No changes today. Heart rate is low but cardiology has been ok with it and atenolol helps your palpitations so will not make adjustments.   Try to cut down on the sweet tooth to help reduce your diabetes risk.

## 2018-03-10 NOTE — Assessment & Plan Note (Signed)
Continue atenolol- doing reasonably well. Likely source of palpitations. HR low but cardiology has not suggested lower or different dose

## 2018-03-10 NOTE — Assessment & Plan Note (Signed)
Noted on exam today. Urology recommended no further PSA. He wants to get another opinion about urolift at [facility]- he will keep me updated if they are ok with PSA

## 2018-03-10 NOTE — Assessment & Plan Note (Signed)
S: controlled on  amlodipine 2.5 mg and atenolol 12.5mg . Despite low HR- cardiology has not recommended reducing dose. Ranges from low into 102 to 140s at home.  BP Readings from Last 3 Encounters:  03/10/18 (!) 130/52  03/03/18 (!) 110/58  02/19/18 (!) 112/56  A/P: We discussed blood pressure goal of <140/90. Continue current meds

## 2018-03-10 NOTE — Assessment & Plan Note (Signed)
S:  controlled on atorvastatin 20mg  Lab Results  Component Value Date   CHOL 121 02/25/2017   HDL 42.60 02/25/2017   LDLCALC 51 02/25/2017   LDLDIRECT 84.9 03/01/2009   TRIG 137.0 02/25/2017   CHOLHDL 3 02/25/2017   A/P: update lipids

## 2018-04-03 ENCOUNTER — Other Ambulatory Visit: Payer: Self-pay

## 2018-04-13 DIAGNOSIS — H348122 Central retinal vein occlusion, left eye, stable: Secondary | ICD-10-CM | POA: Diagnosis not present

## 2018-04-13 DIAGNOSIS — H35033 Hypertensive retinopathy, bilateral: Secondary | ICD-10-CM | POA: Diagnosis not present

## 2018-04-13 DIAGNOSIS — H40013 Open angle with borderline findings, low risk, bilateral: Secondary | ICD-10-CM | POA: Diagnosis not present

## 2018-04-13 DIAGNOSIS — H35041 Retinal micro-aneurysms, unspecified, right eye: Secondary | ICD-10-CM | POA: Diagnosis not present

## 2018-04-21 DIAGNOSIS — K219 Gastro-esophageal reflux disease without esophagitis: Secondary | ICD-10-CM | POA: Diagnosis not present

## 2018-04-21 DIAGNOSIS — R109 Unspecified abdominal pain: Secondary | ICD-10-CM | POA: Diagnosis not present

## 2018-04-21 DIAGNOSIS — I10 Essential (primary) hypertension: Secondary | ICD-10-CM | POA: Diagnosis not present

## 2018-04-21 DIAGNOSIS — Z9049 Acquired absence of other specified parts of digestive tract: Secondary | ICD-10-CM | POA: Diagnosis not present

## 2018-04-21 DIAGNOSIS — K5710 Diverticulosis of small intestine without perforation or abscess without bleeding: Secondary | ICD-10-CM | POA: Diagnosis not present

## 2018-04-21 DIAGNOSIS — K5909 Other constipation: Secondary | ICD-10-CM | POA: Diagnosis not present

## 2018-04-21 DIAGNOSIS — Z8719 Personal history of other diseases of the digestive system: Secondary | ICD-10-CM | POA: Diagnosis not present

## 2018-05-07 ENCOUNTER — Other Ambulatory Visit: Payer: Self-pay | Admitting: Family Medicine

## 2018-06-01 ENCOUNTER — Other Ambulatory Visit: Payer: Self-pay | Admitting: Cardiovascular Disease

## 2018-06-16 DIAGNOSIS — H903 Sensorineural hearing loss, bilateral: Secondary | ICD-10-CM | POA: Diagnosis not present

## 2018-06-16 DIAGNOSIS — H6123 Impacted cerumen, bilateral: Secondary | ICD-10-CM | POA: Diagnosis not present

## 2018-08-06 ENCOUNTER — Encounter: Payer: Self-pay | Admitting: Family Medicine

## 2018-08-07 NOTE — Telephone Encounter (Signed)
I called Pt back and spoke to [name].  She informed me that she had spoke with someone that told her that her and her husband can get that TB screening performed 2 days before they see Dr. Yong Channel.  As I searched records I found where she had been told that from a former employee.  I then asked Marcene Brawn if she could help with placing pts on the nurse schedule for Monday, 08/10/18.  Marcene Brawn added pts to nurses schedule for Monday.  Pt aware.

## 2018-08-07 NOTE — Telephone Encounter (Signed)
See note  Copied from [place] [phone]. Topic: General - Other >> Aug 07, 2018 12:32 PM Ivar Drape wrote: Reason for CRM:   The patient would like to talk to Tyus, Verline Lema.  She answered him via mychart and he would like to talk to her.

## 2018-08-10 ENCOUNTER — Ambulatory Visit (INDEPENDENT_AMBULATORY_CARE_PROVIDER_SITE_OTHER): Payer: Medicare Other

## 2018-08-10 ENCOUNTER — Other Ambulatory Visit: Payer: Self-pay

## 2018-08-10 DIAGNOSIS — Z111 Encounter for screening for respiratory tuberculosis: Secondary | ICD-10-CM

## 2018-08-12 ENCOUNTER — Ambulatory Visit (INDEPENDENT_AMBULATORY_CARE_PROVIDER_SITE_OTHER): Payer: Medicare Other | Admitting: Family Medicine

## 2018-08-12 ENCOUNTER — Encounter: Payer: Self-pay | Admitting: Family Medicine

## 2018-08-12 VITALS — BP 145/58 | HR 44 | Temp 97.5°F | Ht 66.0 in | Wt 151.0 lb

## 2018-08-12 DIAGNOSIS — I700 Atherosclerosis of aorta: Secondary | ICD-10-CM | POA: Diagnosis not present

## 2018-08-12 DIAGNOSIS — I6529 Occlusion and stenosis of unspecified carotid artery: Secondary | ICD-10-CM

## 2018-08-12 DIAGNOSIS — K219 Gastro-esophageal reflux disease without esophagitis: Secondary | ICD-10-CM

## 2018-08-12 DIAGNOSIS — E785 Hyperlipidemia, unspecified: Secondary | ICD-10-CM

## 2018-08-12 DIAGNOSIS — D696 Thrombocytopenia, unspecified: Secondary | ICD-10-CM

## 2018-08-12 DIAGNOSIS — I10 Essential (primary) hypertension: Secondary | ICD-10-CM | POA: Diagnosis not present

## 2018-08-12 DIAGNOSIS — F411 Generalized anxiety disorder: Secondary | ICD-10-CM

## 2018-08-12 LAB — TB SKIN TEST
Induration: 0 mm
TB Skin Test: NEGATIVE

## 2018-08-12 NOTE — Patient Instructions (Addendum)
October visit and repeat labs at that time  webex visit today

## 2018-08-12 NOTE — Progress Notes (Signed)
Phone [phone]   Subjective:  Virtual visit via Video note  Our team/I connected with Corey Fischer on 08/12/18 at 10:40 AM EDT by a video enabled telemedicine application (webex- used wife's session as his code didn't work- video component did not work) and verified that I am speaking with the correct person using two identifiers.  Location patient: [facility] Location provider: [facility], office Persons participating in the virtual visit: patient  Our team/I discussed the limitations of evaluation and management by telemedicine and the availability of in person appointments. In light of current covid-19 pandemic, patient also understands that we are trying to protect them by minimizing in office contact if at all possible.  The patient expressed consent for telemedicine visit and agreed to proceed.   ROS- No chest pain or shortness of breath. No headache or blurry vision.    Past Medical History-  Patient Active Problem List   Diagnosis Date Noted  . Pancreatic cyst 03/28/2015    Priority: High  . Carotid artery stenosis 11/05/2007    Priority: High  . Hyperglycemia 07/06/2014    Priority: Medium  . Symptomatic PVCs     Priority: Medium  . Anxiety state 12/30/2013    Priority: Medium  . Bradycardia 12/10/2012    Priority: Medium  . PAC (premature atrial contraction), symptomatic 10/16/2012    Priority: Medium  . Benign prostatic hyperplasia with nocturia 07/22/2011    Priority: Medium  . Thrombocytopenia ([place]) 04/21/2008    Priority: Medium  . Essential hypertension 04/29/2007    Priority: Medium  . Hyperlipidemia 01/22/2007    Priority: Medium  . Osteoarthritis 03/16/2010    Priority: Low  . Osteopenia 07/06/2009    Priority: Low  . Raynaud's syndrome 06/02/2009    Priority: Low  . Prostatitis 03/01/2009    Priority: Low  . CONSTIPATION, CHRONIC 07/28/2008    Priority: Low  . ESOPHAGEAL STRICTURE 03/04/2008    Priority: Low  . COLONIC POLYPS, HX OF 03/04/2008     Priority: Low  . Unspecified visual loss 10/30/2007    Priority: Low  . Vitamin B12 deficiency 01/26/2007    Priority: Low  . GERD 01/22/2007    Priority: Low  . Aortic atherosclerosis ([place]) 10/16/2017  . Generalized abdominal pain 10/18/2015  . Bloating 10/18/2015  . Schatzki's ring     Medications- reviewed and updated Current Outpatient Medications  Medication Sig Dispense Refill  . amLODipine (NORVASC) 2.5 MG tablet TAKE 1 TABLET AT BEDTIME 90 tablet 3  . Ascorbic Acid (VITAMIN C) 1000 MG tablet Take 1,000 mg by mouth daily.     [name] aspirin 81 MG tablet Take 81 mg by mouth daily.    [name] atenolol (TENORMIN) 25 MG tablet TAKE 1/2 TABLET BY MOUTH ONCE DAILY 45 tablet 5  . atorvastatin (LIPITOR) 20 MG tablet TAKE 1 TABLET BY MOUTH EVERY DAY 90 tablet 1  . cetirizine (ZYRTEC) 10 MG tablet Take 10 mg by mouth daily with breakfast.     . Cholecalciferol (VITAMIN D3) 2000 units capsule Take 2,000 Units by mouth daily.    . Coenzyme Q10 50 MG CAPS Take 250 mg by mouth daily.     . Cyanocobalamin (VITAMIN B-12) 2500 MCG SUBL Place under the tongue.     . Magnesium 200 MG TABS Take 200 mg by mouth daily.     . pantoprazole (PROTONIX) 40 MG tablet Take 1 tablet (40 mg total) by mouth daily. 90 tablet 3  . polyethylene glycol (MIRALAX / GLYCOLAX) packet  Take 17 g by mouth daily.     . Zinc 25 MG TABS Take 25 mg by mouth daily.     . diazepam (VALIUM) 5 MG tablet Take 0.5-1 tablets (2.5-5 mg total) by mouth at bedtime as needed for anxiety (or sleep). (Patient not taking: Reported on 08/12/2018) 30 tablet 5   No current facility-administered medications for this visit.      Objective:  BP (!) 145/58   Pulse (!) 44   Temp (!) 97.5 F (36.4 C) (Oral)   Ht 5\' 6"  (1.676 m)   Wt 151 lb (68.5 kg)   BMI 24.37 kg/m  No distress in voice No obvious respiratory issues    Assessment and Plan   #hypertension S: controlled on atenolol 25 mg-half tablet daily and amlodipine 2.5 mg daily BP  Readings from Last 3 Encounters:  08/12/18 (!) 145/58  03/10/18 (!) 130/52  03/03/18 (!) 110/58  A/P: Stable. Continue current medications.  Mild poor control at home but has been controlled here- he admits to high stress with trying to move- we opted to monitor at 7 month follow up  #hyperlipidemia/carotid stenosis/aortic atherosclerosis S:  controlled on atorvastatin 20 mg daily. Also taking aspirin 81 mg Last carotid check July 2019 and no change Aortic atherosclerosis noted on prior imaging Lab Results  Component Value Date   CHOL 121 03/10/2018   HDL 42.30 03/10/2018   LDLCALC 56 03/10/2018   LDLDIRECT 84.9 03/01/2009   TRIG 113.0 03/10/2018   CHOLHDL 3 03/10/2018   A/P: Stable. Continue current medications.    #GERD S: Compliant with Protonix 40 mg- rare breakthrough. b12 has been ok last check A/P: Stable. Continue current medications. Occasionally adds famotidine or tums  #Insomnia S: goes to sleep pretty quickly- wakes up around 3 am- doesn't always get back to sleep. Uses valium every 3 months  A/P: Stable. Continue current medications. Can refill valium if needed but unlikely to need.     October yearly follow up planned  Future Appointments  Date Time Provider [place]  03/12/2019 10:00 AM [facility]   Return precautions advised.  Garret Reddish, MD

## 2018-08-15 ENCOUNTER — Other Ambulatory Visit: Payer: Self-pay | Admitting: Family Medicine

## 2018-09-08 ENCOUNTER — Ambulatory Visit: Payer: Medicare Other | Admitting: Family Medicine

## 2018-11-12 DIAGNOSIS — D3131 Benign neoplasm of right choroid: Secondary | ICD-10-CM | POA: Diagnosis not present

## 2018-11-12 DIAGNOSIS — H35071 Retinal telangiectasis, right eye: Secondary | ICD-10-CM | POA: Diagnosis not present

## 2018-11-12 DIAGNOSIS — H43812 Vitreous degeneration, left eye: Secondary | ICD-10-CM | POA: Diagnosis not present

## 2018-11-12 DIAGNOSIS — H348322 Tributary (branch) retinal vein occlusion, left eye, stable: Secondary | ICD-10-CM | POA: Diagnosis not present

## 2018-12-03 ENCOUNTER — Encounter: Payer: Self-pay | Admitting: Family Medicine

## 2018-12-07 NOTE — Telephone Encounter (Signed)
PCP has been updated.

## 2019-01-08 ENCOUNTER — Other Ambulatory Visit: Payer: Self-pay

## 2019-01-08 ENCOUNTER — Ambulatory Visit (HOSPITAL_COMMUNITY)
Admission: RE | Admit: 2019-01-08 | Discharge: 2019-01-08 | Disposition: A | Discharge status: Home / Self Care | Payer: Medicare Other | Source: Ambulatory Visit | Attending: Cardiology | Admitting: Cardiology

## 2019-01-08 DIAGNOSIS — I6529 Occlusion and stenosis of unspecified carotid artery: Secondary | ICD-10-CM | POA: Diagnosis not present

## 2019-01-11 ENCOUNTER — Other Ambulatory Visit: Payer: Self-pay | Admitting: *Deleted

## 2019-01-11 DIAGNOSIS — I6529 Occlusion and stenosis of unspecified carotid artery: Secondary | ICD-10-CM

## 2019-01-21 ENCOUNTER — Telehealth: Payer: Self-pay | Admitting: Cardiovascular Disease

## 2019-01-21 DIAGNOSIS — R319 Hematuria, unspecified: Secondary | ICD-10-CM | POA: Diagnosis not present

## 2019-01-21 NOTE — Telephone Encounter (Signed)
  Patient wants his wife to come to his visit because he has a hard time remembering things.

## 2019-01-21 NOTE — Telephone Encounter (Signed)
Pt' wife aware okay to come to appt but will have to wear a mask as well the patient's wife agrees.

## 2019-01-26 ENCOUNTER — Ambulatory Visit (INDEPENDENT_AMBULATORY_CARE_PROVIDER_SITE_OTHER): Payer: Medicare Other | Admitting: Cardiovascular Disease

## 2019-01-26 ENCOUNTER — Encounter: Payer: Self-pay | Admitting: Cardiovascular Disease

## 2019-01-26 ENCOUNTER — Other Ambulatory Visit: Payer: Self-pay

## 2019-01-26 DIAGNOSIS — I10 Essential (primary) hypertension: Secondary | ICD-10-CM

## 2019-01-26 DIAGNOSIS — I6523 Occlusion and stenosis of bilateral carotid arteries: Secondary | ICD-10-CM

## 2019-01-26 DIAGNOSIS — E782 Mixed hyperlipidemia: Secondary | ICD-10-CM | POA: Diagnosis not present

## 2019-01-26 NOTE — Patient Instructions (Signed)
Medication Instructions:  Your physician recommends that you continue on your current medications as directed. Please refer to the Current Medication list given to you today.  If you need a refill on your cardiac medications before your next appointment, please call your pharmacy.   Lab work: none If you have labs (blood work) drawn today and your tests are completely normal, you will receive your results only by: [name] MyChart Message (if you have MyChart) OR . A paper copy in the mail If you have any lab test that is abnormal or we need to change your treatment, we will call you to review the results.  Testing/Procedures: Your physician has requested that you have a carotid duplex. This test is an ultrasound of the carotid arteries in your neck. It looks at blood flow through these arteries that supply the brain with blood. Allow one hour for this exam. There are no restrictions or special instructions. TO BE SCHEDULED FOR 12 MONTHS   Follow-Up: At [facility], you and your health needs are our priority.  As part of our continuing mission to provide you with exceptional heart care, we have created designated Provider Care Teams.  These Care Teams include your primary Cardiologist (physician) and Advanced Practice Providers (APPs -  Physician Assistants and Nurse Practitioners) who all work together to provide you with the care you need, when you need it. You will need a follow up appointment in 12 months with Dr. Quay Burow.  Please call our office 2 months in advance to schedule this/each appointment.

## 2019-01-26 NOTE — Progress Notes (Signed)
01/26/2019 Corey Fischer   February 10, 1932  [id number]  Primary Physician [name], MD Primary Cardiologist: [name] MD [name], [place]  HPI:  Corey Fischer is a 85 y.o.   thin and fit appearing married Caucasian male father of 2, grandfather to 4 grandchildren who was self-referred for evaluation of symptomatic PVCs.I last saw him in the office  12/17/2017.  Since I saw him a year ago he has moved into [facility] 09/18/2018 on the third floor which he is enjoying.  This is a retirement community, independent living.  He is accompanied by his wife today.[name]  He is retired from working at [employer] 7 years ago. His risk factors are remarkable for treated hypertension and hyperlipidemia. He does not smoke and drinks socially. His family history is remarkable for a mother who had atrial fibrillation and a father who had angina, but no one has had documented ischemic heart disease. He has never had an MI or a stroke and denies chest pain or shortness of breath. His surgical history is remarkable for remote TURP, cholecystectomy, and hernia repair.  He has had PVCs since graduate school and was on propranolol remotely, prescribed by Dr. Coralie Keens. Over the last 2 months he has had progressive PVCs which he is more aware of and symptomatic from. He has cut out his caffeine. Lab work performed by Dr. Arnoldo Morale revealed a normal TSH.  He saw [name] registered nurse practitioner back at the end of May 2014 because of palpitations have gotten worse. He was started on Cardizem 30 mg by mouth twice a day and his simvastatin was decreased from 40-20 mg a day. The 2-D echo is essentially normal. He has been checking his blood pressures which are somewhat sporadic but averaging 1:30 to 409 range systolic.  We have stopped his diltiazem which would cause contributing to his Wenkebach and symptoms and changed him to amlodipine. He had no further symptoms. He denies chest pain or shortness  of breath. His lipid profile was excellent on statin therapy last measured 05/07/13.  He saw [name] in our [employer] office 03/07/14 because of several days of symptom of palpitations. His EKG showed PACs. He was initially begun on metoprolol and later switched to low-dose atenolol resulted in improvement in his symptoms.  Since being seen back in 6 months ago he's remained currently stable. He gets occasional episodes of symptomatic PVCs which are self-limited. He denies chest pain or shortness of breath. He does have moderate bilateral internal carotid artery stenosis which we followed by duplex ultrasound. He has been seen in the emergency room several times for abdominal pain of unclear etiology. He did have an abdominal pelvic CTA which parenthetically showed severe aortoiliac atherosclerotic changes although he has 2+ pedal pulses and denies claudication.  Since I saw him 12 months ago he continues to do well.  He was a avid exerciser which is somewhat been affected by COVID-19 although the independent living facility, her preferred landing, but he moved in on 09/18/2018 has a complete gym when she is anticipating going back to using.  Is otherwise asymptomatic.   Current Meds  Medication Sig  . amLODipine (NORVASC) 2.5 MG tablet TAKE 1 TABLET AT BEDTIME  . Ascorbic Acid (VITAMIN C) 1000 MG tablet Take 1,000 mg by mouth daily.   [name] aspirin 81 MG tablet Take 81 mg by mouth daily.  [name] atenolol (TENORMIN) 25 MG tablet TAKE 1/2 TABLET BY MOUTH  ONCE DAILY  . atorvastatin (LIPITOR) 20 MG tablet TAKE 1 TABLET BY MOUTH EVERY DAY  . cetirizine (ZYRTEC) 10 MG tablet Take 10 mg by mouth daily with breakfast.   . Cholecalciferol (VITAMIN D3) 2000 units capsule Take 2,000 Units by mouth daily.  . Coenzyme Q10 200 MG TABS Take by mouth daily.   . Coenzyme Q10 50 MG TABS Take 1 tablet by mouth daily.  . Cyanocobalamin (VITAMIN B-12) 1000 MCG SUBL Place under the tongue.   . diazepam (VALIUM) 5 MG  tablet Take 0.5-1 tablets (2.5-5 mg total) by mouth at bedtime as needed for anxiety (or sleep).  . Magnesium 200 MG TABS Take 200 mg by mouth daily.   . pantoprazole (PROTONIX) 40 MG tablet Take 1 tablet (40 mg total) by mouth daily.  . polyethylene glycol (MIRALAX / GLYCOLAX) packet Take 17 g by mouth daily.   . Zinc 25 MG TABS Take 25 mg by mouth daily.      Allergies  Allergen Reactions  . Ciprofloxacin Other (See Comments)    Unknown reaction    Social History   Socioeconomic History  . Marital status: Married    Spouse name: Not on file  . Number of children: 2  . Years of education: Not on file  . Highest education level: Not on file  Occupational History  . Occupation: retired    Fish farm manager: RETIRED  Social Needs  . Financial resource strain: Not on file  . Food insecurity    Worry: Not on file    Inability: Not on file  . Transportation needs    Medical: Not on file    Non-medical: Not on file  Tobacco Use  . Smoking status: Never Smoker  . Smokeless tobacco: Never Used  Substance and Sexual Activity  . Alcohol use: Yes    Comment: occasionally  . Drug use: No  . Sexual activity: Yes  Lifestyle  . Physical activity    Days per week: Not on file    Minutes per session: Not on file  . Stress: Not on file  Relationships  . Social Herbalist on phone: Not on file    Gets together: Not on file    Attends religious service: Not on file    Active member of club or organization: Not on file    Attends meetings of clubs or organizations: Not on file    Relationship status: Not on file  . Intimate partner violence    Fear of current or ex partner: Not on file    Emotionally abused: Not on file    Physically abused: Not on file    Forced sexual activity: Not on file  Other Topics Concern  . Not on file  Social History Narrative   Grew up in small town in [place]. 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 Met wife in  [place]. At 63, got married and went to college-undergrad in [employer] as well as Oceanographer, doctorate in Location manager.  Worked at [employer] which eventually became [employer]. Lived in [place] [facility] [place] and [place] before eventually moving to [place]. 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       Married over 50 years. 29 years 01/2014. 2 married children with 2 children each. 1 son near [place]. 1 son in [place] with house of rep.       Hobbies: work out most mornings 5 days a week (spin, elipitical), racquetball until 2014  after hip surgery.      Review of Systems: General: negative for chills, fever, night sweats or weight changes.  Cardiovascular: negative for chest pain, dyspnea on exertion, edema, orthopnea, palpitations, paroxysmal nocturnal dyspnea or shortness of breath Dermatological: negative for rash Respiratory: negative for cough or wheezing Urologic: negative for hematuria Abdominal: negative for nausea, vomiting, diarrhea, bright red blood per rectum, melena, or hematemesis Neurologic: negative for visual changes, syncope, or dizziness All other systems reviewed and are otherwise negative except as noted above.    Blood pressure 140/73, pulse (!) 46, height 5' 7"  (1.702 m), weight 159 lb 9.6 oz (72.4 kg), SpO2 98 %.  General appearance: alert and no distress Neck: no adenopathy, no JVD, supple, symmetrical, trachea midline, thyroid not enlarged, symmetric, no tenderness/mass/nodules and Right carotid bruit Lungs: clear to auscultation bilaterally Heart: regular rate and rhythm, S1, S2 normal, no murmur, click, rub or gallop Extremities: extremities normal, atraumatic, no cyanosis or edema Pulses: 2+ and symmetric Skin: Skin color, texture, turgor normal. No rashes or lesions Neurologic: Alert and oriented X 3, normal strength and tone. Normal symmetric reflexes. Normal coordination and gait  EKG sinus bradycardia 46 with left axis deviation and minimal voltage for LVH.  I personally  reviewed this EKG.  ASSESSMENT AND PLAN:   Hyperlipidemia History of hyperlipidemia on statin therapy with lipid profile performed 03/10/2018 revealing a total cholesterol 121, LDL 56 and HDL 42.  Essential hypertension History of essential hypertension with blood pressure measured today at 140/73.  He is on amlodipine and atenolol.  Carotid artery stenosis History of mild to moderate internal carotid artery stenosis in the 30 to 40% range right greater than left by recent duplex 01/08/2019.  We will repeat carotid Doppler studies in 1 year.      [name] MD FACP,FACC,FAHA, [facility] 01/26/2019 9:58 AM

## 2019-01-26 NOTE — Assessment & Plan Note (Signed)
History of hyperlipidemia on statin therapy with lipid profile performed 03/10/2018 revealing a total cholesterol 121, LDL 56 and HDL 42.

## 2019-01-26 NOTE — Assessment & Plan Note (Signed)
History of essential hypertension with blood pressure measured today at 140/73.  He is on amlodipine and atenolol.

## 2019-01-26 NOTE — Assessment & Plan Note (Signed)
History of mild to moderate internal carotid artery stenosis in the 30 to 40% range right greater than left by recent duplex 01/08/2019.  We will repeat carotid Doppler studies in 1 year.

## 2019-02-04 DIAGNOSIS — N401 Enlarged prostate with lower urinary tract symptoms: Secondary | ICD-10-CM | POA: Diagnosis not present

## 2019-02-04 DIAGNOSIS — R3912 Poor urinary stream: Secondary | ICD-10-CM | POA: Diagnosis not present

## 2019-02-04 DIAGNOSIS — R310 Gross hematuria: Secondary | ICD-10-CM | POA: Diagnosis not present

## 2019-02-04 DIAGNOSIS — R972 Elevated prostate specific antigen [PSA]: Secondary | ICD-10-CM | POA: Diagnosis not present

## 2019-02-19 DIAGNOSIS — L910 Hypertrophic scar: Secondary | ICD-10-CM | POA: Diagnosis not present

## 2019-02-19 DIAGNOSIS — L814 Other melanin hyperpigmentation: Secondary | ICD-10-CM | POA: Diagnosis not present

## 2019-02-19 DIAGNOSIS — L821 Other seborrheic keratosis: Secondary | ICD-10-CM | POA: Diagnosis not present

## 2019-02-19 DIAGNOSIS — D1801 Hemangioma of skin and subcutaneous tissue: Secondary | ICD-10-CM | POA: Diagnosis not present

## 2019-02-19 DIAGNOSIS — L570 Actinic keratosis: Secondary | ICD-10-CM | POA: Diagnosis not present

## 2019-02-21 DIAGNOSIS — Z23 Encounter for immunization: Secondary | ICD-10-CM | POA: Diagnosis not present

## 2019-03-11 ENCOUNTER — Ambulatory Visit: Payer: Medicare Other

## 2019-03-12 ENCOUNTER — Ambulatory Visit: Payer: Medicare Other

## 2019-03-17 DIAGNOSIS — N401 Enlarged prostate with lower urinary tract symptoms: Secondary | ICD-10-CM | POA: Diagnosis not present

## 2019-03-17 DIAGNOSIS — Z0189 Encounter for other specified special examinations: Secondary | ICD-10-CM | POA: Diagnosis not present

## 2019-03-17 DIAGNOSIS — E785 Hyperlipidemia, unspecified: Secondary | ICD-10-CM | POA: Diagnosis not present

## 2019-03-17 DIAGNOSIS — I10 Essential (primary) hypertension: Secondary | ICD-10-CM | POA: Diagnosis not present

## 2019-03-17 DIAGNOSIS — R739 Hyperglycemia, unspecified: Secondary | ICD-10-CM | POA: Diagnosis not present

## 2019-03-17 DIAGNOSIS — I700 Atherosclerosis of aorta: Secondary | ICD-10-CM | POA: Diagnosis not present

## 2019-03-17 DIAGNOSIS — R351 Nocturia: Secondary | ICD-10-CM | POA: Diagnosis not present

## 2019-03-18 DIAGNOSIS — M5416 Radiculopathy, lumbar region: Secondary | ICD-10-CM | POA: Diagnosis not present

## 2019-03-18 DIAGNOSIS — I10 Essential (primary) hypertension: Secondary | ICD-10-CM | POA: Diagnosis not present

## 2019-03-18 DIAGNOSIS — R6889 Other general symptoms and signs: Secondary | ICD-10-CM | POA: Diagnosis not present

## 2019-03-18 DIAGNOSIS — Z96642 Presence of left artificial hip joint: Secondary | ICD-10-CM | POA: Diagnosis not present

## 2019-03-18 DIAGNOSIS — R739 Hyperglycemia, unspecified: Secondary | ICD-10-CM | POA: Diagnosis not present

## 2019-03-18 DIAGNOSIS — Z0000 Encounter for general adult medical examination without abnormal findings: Secondary | ICD-10-CM | POA: Diagnosis not present

## 2019-03-19 DIAGNOSIS — Z96642 Presence of left artificial hip joint: Secondary | ICD-10-CM | POA: Diagnosis not present

## 2019-03-19 DIAGNOSIS — Z0000 Encounter for general adult medical examination without abnormal findings: Secondary | ICD-10-CM | POA: Diagnosis not present

## 2019-03-19 DIAGNOSIS — I700 Atherosclerosis of aorta: Secondary | ICD-10-CM | POA: Diagnosis not present

## 2019-03-19 DIAGNOSIS — M25552 Pain in left hip: Secondary | ICD-10-CM | POA: Diagnosis not present

## 2019-03-19 DIAGNOSIS — M4726 Other spondylosis with radiculopathy, lumbar region: Secondary | ICD-10-CM | POA: Diagnosis not present

## 2019-03-22 DIAGNOSIS — N486 Induration penis plastica: Secondary | ICD-10-CM | POA: Diagnosis not present

## 2019-03-22 DIAGNOSIS — R319 Hematuria, unspecified: Secondary | ICD-10-CM | POA: Diagnosis not present

## 2019-03-22 DIAGNOSIS — N5201 Erectile dysfunction due to arterial insufficiency: Secondary | ICD-10-CM | POA: Diagnosis not present

## 2019-03-22 DIAGNOSIS — R351 Nocturia: Secondary | ICD-10-CM | POA: Diagnosis not present

## 2019-03-22 DIAGNOSIS — N401 Enlarged prostate with lower urinary tract symptoms: Secondary | ICD-10-CM | POA: Diagnosis not present

## 2019-03-24 DIAGNOSIS — M79661 Pain in right lower leg: Secondary | ICD-10-CM | POA: Diagnosis not present

## 2019-03-25 DIAGNOSIS — S86811A Strain of other muscle(s) and tendon(s) at lower leg level, right leg, initial encounter: Secondary | ICD-10-CM | POA: Diagnosis not present

## 2019-03-25 DIAGNOSIS — M1711 Unilateral primary osteoarthritis, right knee: Secondary | ICD-10-CM | POA: Diagnosis not present

## 2019-03-25 DIAGNOSIS — M79661 Pain in right lower leg: Secondary | ICD-10-CM | POA: Diagnosis not present

## 2019-03-26 ENCOUNTER — Other Ambulatory Visit: Payer: Self-pay | Admitting: Family Medicine

## 2019-03-26 ENCOUNTER — Other Ambulatory Visit: Payer: Self-pay | Admitting: Internal Medicine

## 2019-04-12 ENCOUNTER — Other Ambulatory Visit: Payer: Self-pay

## 2019-04-21 DIAGNOSIS — H35013 Changes in retinal vascular appearance, bilateral: Secondary | ICD-10-CM | POA: Diagnosis not present

## 2019-04-21 DIAGNOSIS — H35033 Hypertensive retinopathy, bilateral: Secondary | ICD-10-CM | POA: Diagnosis not present

## 2019-04-21 DIAGNOSIS — H348122 Central retinal vein occlusion, left eye, stable: Secondary | ICD-10-CM | POA: Diagnosis not present

## 2019-04-21 DIAGNOSIS — H40013 Open angle with borderline findings, low risk, bilateral: Secondary | ICD-10-CM | POA: Diagnosis not present

## 2019-04-22 DIAGNOSIS — S86811A Strain of other muscle(s) and tendon(s) at lower leg level, right leg, initial encounter: Secondary | ICD-10-CM | POA: Diagnosis not present

## 2019-05-03 DIAGNOSIS — M79604 Pain in right leg: Secondary | ICD-10-CM | POA: Diagnosis not present

## 2019-05-03 DIAGNOSIS — S86811D Strain of other muscle(s) and tendon(s) at lower leg level, right leg, subsequent encounter: Secondary | ICD-10-CM | POA: Diagnosis not present

## 2019-05-07 DIAGNOSIS — S86811D Strain of other muscle(s) and tendon(s) at lower leg level, right leg, subsequent encounter: Secondary | ICD-10-CM | POA: Diagnosis not present

## 2019-05-07 DIAGNOSIS — M79604 Pain in right leg: Secondary | ICD-10-CM | POA: Diagnosis not present

## 2019-05-18 ENCOUNTER — Other Ambulatory Visit: Payer: Self-pay | Admitting: Family Medicine

## 2019-05-18 ENCOUNTER — Other Ambulatory Visit: Payer: Self-pay | Admitting: Cardiovascular Disease

## 2019-05-18 MED ORDER — ATORVASTATIN CALCIUM 20 MG PO TABS: 20.0000 mg | ORAL_TABLET | Freq: Every day | ORAL | 3 refills | Status: DC

## 2019-05-18 NOTE — Telephone Encounter (Signed)
Follow Up  Pt called back and said he told us the wrong medicine to refill. The medicine that should be refilled is the atorvastatin (LIPITOR) 20 MG tablet NOT atenolol (TENORMIN) 25 MG tablet

## 2019-05-18 NOTE — Telephone Encounter (Signed)
Follow Up   *STAT* If patient is at the pharmacy, call can be transferred to refill team.   1. Which medications need to be refilled? (please list name of each medication and dose if known) atorvastatin (LIPITOR) 20 MG tablet  2. Which pharmacy/location (including street and city if local pharmacy) is medication to be sent to?  [id number] [place]  3. Do they need a 30 day or 90 day supply? 90 day

## 2019-05-18 NOTE — Telephone Encounter (Signed)
New Message   *STAT* If patient is at the pharmacy, call can be transferred to refill team.   1. Which medications need to be refilled? (please list name of each medication and dose if known) atenolol (TENORMIN) 25 MG tablet  2. Which pharmacy/location (including street and city if local pharmacy) is medication to be sent to? [id number] [place], [place]  3. Do they need a 30 day or 90 day supply? 90 day

## 2019-05-21 DIAGNOSIS — G4730 Sleep apnea, unspecified: Secondary | ICD-10-CM

## 2019-05-21 HISTORY — DX: Sleep apnea, unspecified: G47.30

## 2019-06-01 DIAGNOSIS — Z23 Encounter for immunization: Secondary | ICD-10-CM | POA: Diagnosis not present

## 2019-06-02 DIAGNOSIS — L820 Inflamed seborrheic keratosis: Secondary | ICD-10-CM | POA: Diagnosis not present

## 2019-06-02 DIAGNOSIS — L821 Other seborrheic keratosis: Secondary | ICD-10-CM | POA: Diagnosis not present

## 2019-06-02 DIAGNOSIS — D1801 Hemangioma of skin and subcutaneous tissue: Secondary | ICD-10-CM | POA: Diagnosis not present

## 2019-06-02 DIAGNOSIS — L570 Actinic keratosis: Secondary | ICD-10-CM | POA: Diagnosis not present

## 2019-06-02 DIAGNOSIS — L910 Hypertrophic scar: Secondary | ICD-10-CM | POA: Diagnosis not present

## 2019-06-02 DIAGNOSIS — L723 Sebaceous cyst: Secondary | ICD-10-CM | POA: Diagnosis not present

## 2019-06-02 DIAGNOSIS — L814 Other melanin hyperpigmentation: Secondary | ICD-10-CM | POA: Diagnosis not present

## 2019-06-29 DIAGNOSIS — Z23 Encounter for immunization: Secondary | ICD-10-CM | POA: Diagnosis not present

## 2019-07-16 DIAGNOSIS — H04123 Dry eye syndrome of bilateral lacrimal glands: Secondary | ICD-10-CM | POA: Diagnosis not present

## 2019-07-30 DIAGNOSIS — D1801 Hemangioma of skin and subcutaneous tissue: Secondary | ICD-10-CM | POA: Diagnosis not present

## 2019-07-30 DIAGNOSIS — L814 Other melanin hyperpigmentation: Secondary | ICD-10-CM | POA: Diagnosis not present

## 2019-07-30 DIAGNOSIS — L910 Hypertrophic scar: Secondary | ICD-10-CM | POA: Diagnosis not present

## 2019-07-30 DIAGNOSIS — L570 Actinic keratosis: Secondary | ICD-10-CM | POA: Diagnosis not present

## 2019-07-30 DIAGNOSIS — L820 Inflamed seborrheic keratosis: Secondary | ICD-10-CM | POA: Diagnosis not present

## 2019-08-18 ENCOUNTER — Other Ambulatory Visit: Payer: Self-pay | Admitting: Cardiovascular Disease

## 2019-09-15 DIAGNOSIS — R739 Hyperglycemia, unspecified: Secondary | ICD-10-CM | POA: Diagnosis not present

## 2019-09-15 DIAGNOSIS — I10 Essential (primary) hypertension: Secondary | ICD-10-CM | POA: Diagnosis not present

## 2019-09-16 DIAGNOSIS — I491 Atrial premature depolarization: Secondary | ICD-10-CM | POA: Diagnosis not present

## 2019-09-16 DIAGNOSIS — R739 Hyperglycemia, unspecified: Secondary | ICD-10-CM | POA: Diagnosis not present

## 2019-09-16 DIAGNOSIS — G479 Sleep disorder, unspecified: Secondary | ICD-10-CM | POA: Diagnosis not present

## 2019-09-16 DIAGNOSIS — G4700 Insomnia, unspecified: Secondary | ICD-10-CM | POA: Diagnosis not present

## 2019-09-30 DIAGNOSIS — G479 Sleep disorder, unspecified: Secondary | ICD-10-CM | POA: Diagnosis not present

## 2019-09-30 DIAGNOSIS — G4733 Obstructive sleep apnea (adult) (pediatric): Secondary | ICD-10-CM | POA: Diagnosis not present

## 2019-09-30 DIAGNOSIS — G4700 Insomnia, unspecified: Secondary | ICD-10-CM | POA: Diagnosis not present

## 2019-09-30 DIAGNOSIS — I491 Atrial premature depolarization: Secondary | ICD-10-CM | POA: Diagnosis not present

## 2019-11-11 ENCOUNTER — Encounter (INDEPENDENT_AMBULATORY_CARE_PROVIDER_SITE_OTHER): Payer: Medicare Other | Admitting: Ophthalmology

## 2019-11-25 DIAGNOSIS — G4733 Obstructive sleep apnea (adult) (pediatric): Secondary | ICD-10-CM | POA: Diagnosis not present

## 2019-11-26 DIAGNOSIS — L821 Other seborrheic keratosis: Secondary | ICD-10-CM | POA: Diagnosis not present

## 2019-11-26 DIAGNOSIS — L814 Other melanin hyperpigmentation: Secondary | ICD-10-CM | POA: Diagnosis not present

## 2019-11-26 DIAGNOSIS — D1801 Hemangioma of skin and subcutaneous tissue: Secondary | ICD-10-CM | POA: Diagnosis not present

## 2019-11-26 DIAGNOSIS — L723 Sebaceous cyst: Secondary | ICD-10-CM | POA: Diagnosis not present

## 2019-11-26 DIAGNOSIS — L820 Inflamed seborrheic keratosis: Secondary | ICD-10-CM | POA: Diagnosis not present

## 2019-11-26 DIAGNOSIS — L570 Actinic keratosis: Secondary | ICD-10-CM | POA: Diagnosis not present

## 2019-11-26 DIAGNOSIS — L910 Hypertrophic scar: Secondary | ICD-10-CM | POA: Diagnosis not present

## 2019-11-29 DIAGNOSIS — G4733 Obstructive sleep apnea (adult) (pediatric): Secondary | ICD-10-CM | POA: Diagnosis not present

## 2019-11-30 ENCOUNTER — Other Ambulatory Visit: Payer: Self-pay

## 2019-11-30 ENCOUNTER — Ambulatory Visit (INDEPENDENT_AMBULATORY_CARE_PROVIDER_SITE_OTHER): Payer: Medicare Other | Admitting: Ophthalmology

## 2019-11-30 ENCOUNTER — Encounter (INDEPENDENT_AMBULATORY_CARE_PROVIDER_SITE_OTHER): Payer: Self-pay | Admitting: Ophthalmology

## 2019-11-30 DIAGNOSIS — H348322 Tributary (branch) retinal vein occlusion, left eye, stable: Secondary | ICD-10-CM | POA: Diagnosis not present

## 2019-11-30 DIAGNOSIS — D3131 Benign neoplasm of right choroid: Secondary | ICD-10-CM | POA: Diagnosis not present

## 2019-11-30 DIAGNOSIS — H35071 Retinal telangiectasis, right eye: Secondary | ICD-10-CM

## 2019-11-30 NOTE — Assessment & Plan Note (Signed)
The nature of choroidal nevus was discussed with the patient and an informational form was offered.  Photo documentation was discussed with the patient.  Periodic follow-up may be needed for a lifetime. The patient's questions were answered. At minimum, annual exams will be needed if no signs of early growth. 

## 2019-11-30 NOTE — Assessment & Plan Note (Signed)
Continue to observe, no active disease

## 2019-11-30 NOTE — Progress Notes (Signed)
11/30/2019     CHIEF COMPLAINT Patient presents for Retina Follow Up   HISTORY OF PRESENT ILLNESS: Corey Fischer is a 86 y.o. male who presents to the clinic today for:   HPI    Retina Follow Up    Patient presents with  Other.  In both eyes.  This started 1 year ago.  Since onset it is stable.          Comments    1 year follow up - OCT OU & FP OU Patient overall has no complaints but states that sometimes he sees dark spots, especially when there is a glare.       Last edited by Gerda Diss on 11/30/2019  1:55 PM. (History)      Referring physician: [name], MD [place],  [place] [address]  HISTORICAL INFORMATION:   Selected notes from the MEDICAL RECORD NUMBER    Lab Results  Component Value Date   HGBA1C 5.9 03/10/2018     CURRENT MEDICATIONS: No current outpatient medications on file. (Ophthalmic Drugs)   No current facility-administered medications for this visit. (Ophthalmic Drugs)   Current Outpatient Medications (Other)  Medication Sig   amLODipine (NORVASC) 2.5 MG tablet TAKE 1 TABLET AT BEDTIME   Ascorbic Acid (VITAMIN C) 1000 MG tablet Take 1,000 mg by mouth daily.    aspirin 81 MG tablet Take 81 mg by mouth daily.   atenolol (TENORMIN) 25 MG tablet TAKE 1/2 TABLET BY MOUTH ONCE DAILY   atorvastatin (LIPITOR) 20 MG tablet Take 1 tablet (20 mg total) by mouth daily.   cetirizine (ZYRTEC) 10 MG tablet Take 10 mg by mouth daily with breakfast.    Cholecalciferol (VITAMIN D3) 2000 units capsule Take 2,000 Units by mouth daily.   Coenzyme Q10 200 MG TABS Take by mouth daily.    Coenzyme Q10 50 MG TABS Take 1 tablet by mouth daily.   Cyanocobalamin (VITAMIN B-12) 1000 MCG SUBL Place under the tongue.    diazepam (VALIUM) 5 MG tablet Take 0.5-1 tablets (2.5-5 mg total) by mouth at bedtime as needed for anxiety (or sleep).   Magnesium 200 MG TABS Take 200 mg by mouth daily.    pantoprazole (PROTONIX) 40 MG tablet TAKE 1  TABLET BY MOUTH EVERY DAY   polyethylene glycol (MIRALAX / GLYCOLAX) packet Take 17 g by mouth daily.    Zinc 25 MG TABS Take 25 mg by mouth daily.    No current facility-administered medications for this visit. (Other)      REVIEW OF SYSTEMS:    ALLERGIES Allergies  Allergen Reactions   Ciprofloxacin Other (See Comments)    Unknown reaction    PAST MEDICAL HISTORY Past Medical History:  Diagnosis Date   Abdominal pain 03/19/2015   Allergy    Anxiety    Arthritis    Asymptomatic bilateral carotid artery stenosis    a. Last duplex 11/2013 R bulb and ICA - 50-69%, R ECA >50%. L bulb and prox ICA: 0-49%. Repeat due 11/2014.   B12 deficiency    Bilateral carotid artery disease (HCC)    BPH (benign prostatic hyperplasia)    Chronic ITP (idiopathic thrombocytopenia) (HCC)    Depression    Diverticulosis    Elevated PSA    Gastric polyp    Gastritis    GERD (gastroesophageal reflux disease)    H/O hiatal hernia    Hemorrhoids    History of hiatal hernia    Hx of  adenomatous colonic polyps 2010   Hyperlipidemia    Hypertension    INTERNAL HEMORRHOIDS WITHOUT MENTION COMP 08/14/2007   Qualifier: Diagnosis of  By: Arnoldo Morale MD, Balinda Quails    Osteoarthritis hips   PAC (premature atrial contraction)    a. NSR/SB with 1st degree AVB and PACs/atrial bigeminy seen on event monitor 11/2012.   Palpitations    Raynaud's syndrome    Schatzki's ring    Sinus bradycardia    Stricture and stenosis of esophagus 2010   last endoscopy- 2010   Symptomatic PVCs    a. Per report, monitor 05/2012 - NSR with symptomatic PVC. b. Echo 05/2012- EF 55-60%, aortic sclerosis without stenosis. c. Nuc 12/2012 - normal.   Tubular adenoma of colon    Wandering (atrial) pacemaker    Past Surgical History:  Procedure Laterality Date   APPENDECTOMY  age 12   Weott that he is "pristine "   CAROTID DUPLEX Bilateral 05-26-2007    CHOLECYSTECTOMY  1989   COLONOSCOPY WITH PROPOFOL N/A 03/22/2015   Procedure: COLONOSCOPY WITH PROPOFOL;  Surgeon: Jerene Bears, MD;  Location: [facility];  Service: Endoscopy;  Laterality: N/A;   ESOPHAGOGASTRODUODENOSCOPY (EGD) WITH PROPOFOL N/A 03/22/2015   Procedure: ESOPHAGOGASTRODUODENOSCOPY (EGD) WITH PROPOFOL;  Surgeon: Jerene Bears, MD;  Location: [facility];  Service: Endoscopy;  Laterality: N/A;   LAPAROSCOPIC INGUINAL HERNIA REPAIR Bilateral 09-19-2010   LEFT SHOULDER SURG.  2006   ROTATOR CUFF REPAIR Right 1987   TONSILLECTOMY  age 48   TOTAL HIP ARTHROPLASTY Left 05/31/2013   Procedure: TOTAL HIP ARTHROPLASTY;  Surgeon: Kerin Salen, MD;  Location: [place];  Service: Orthopedics;  Laterality: Left;   TRANSURETHRAL RESECTION OF PROSTATE  07/22/2011   Procedure: TRANSURETHRAL RESECTION OF THE PROSTATE WITH GYRUS INSTRUMENTS;  Surgeon: Bernestine Amass, MD;  Location: [facility];  Service: Urology;  Laterality: N/A;  Saline Gyrus TURP     FAMILY HISTORY Family History  Problem Relation Age of Onset   Heart disease Mother    Colon polyps Mother    Heart disease Father    Angina Father    Cancer Maternal Grandmother    Heart failure Maternal Grandfather    Colon cancer Neg Hx     SOCIAL HISTORY Social History   Tobacco Use   Smoking status: Never Smoker   Smokeless tobacco: Never Used  Scientific laboratory technician Use: Never used  Substance Use Topics   Alcohol use: Yes    Comment: occasionally   Drug use: No         OPHTHALMIC EXAM:  Base Eye Exam    Visual Acuity (Snellen - Linear)      Right Left   Dist cc 20/20 20/25-2       Tonometry (Tonopen, 1:58 PM)      Right Left   Pressure 18 16       Pupils      Pupils Dark Light Shape React APD   Right PERRL 3 2 Round Brisk None   Left PERRL 3 2 Round Brisk None       Visual Fields (Counting fingers)      Left Right    Full Full       Extraocular Movement      Right  Left    Full Full       Neuro/Psych    Oriented x3: Yes   Mood/Affect: Normal  Dilation    Both eyes: 1.0% Mydriacyl, 2.5% Phenylephrine @ 1:58 PM        Slit Lamp and Fundus Exam    External Exam      Right Left   External Normal Normal       Slit Lamp Exam      Right Left   Lids/Lashes Normal Normal   Conjunctiva/Sclera White and quiet White and quiet   Cornea Clear Clear   Anterior Chamber Deep and quiet Deep and quiet   Iris Round and reactive Round and reactive   Lens Posterior chamber intraocular lens Posterior chamber intraocular lens   Anterior Vitreous Normal Normal       Fundus Exam      Right Left   Posterior Vitreous Posterior vitreous detachment Posterior vitreous detachment   Disc Normal Collaterals on the nerve   C/D Ratio 0.25 0.25   Macula Normal Microaneurysms, no exudates, no macular thickening   Vessels Normal Old inferior retinal vein occlusion, compensated stable   Periphery Normal Normal          IMAGING AND PROCEDURES  Imaging and Procedures for 11/30/19  OCT, Retina - OU - Both Eyes       Right Eye Quality was good. Scan locations included subfoveal. Central Foveal Thickness: 272.   Left Eye Quality was good. Scan locations included subfoveal. Central Foveal Thickness: 241. Progression has been stable.   Notes OS with posterior vitreous detachment, also with areas of atrophy of the macula diffusely in the region of prior retinal vein occlusion, no active CME       Color Fundus Photography Optos - OU - Both Eyes       Right Eye Progression has been stable.   Left Eye Progression has been stable.   Notes OD, small flat 1 disc area choroidal nevus, normal pigmentation inferior to the optic nerve, no high risk features  OS, optic nerve vessel collateralization post retinal vein occlusion compensation no neovascularization with large vitreous floater of PVD                ASSESSMENT/PLAN:  Branch retinal  vein occlusion of left eye Continue to observe, no active disease  Choroidal nevus of right eye The nature of choroidal nevus was discussed with the patient and an informational form was offered.  Photo documentation was discussed with the patient.  Periodic follow-up may be needed for a lifetime. The patient's questions were answered. At minimum, annual exams will be needed if no signs of early growth.      ICD-10-CM   1. Retinal telangiectasia of right eye  H35.071 OCT, Retina - OU - Both Eyes    Color Fundus Photography Optos - OU - Both Eyes  2. Choroidal nevus of right eye  D31.31 Color Fundus Photography Optos - OU - Both Eyes  3. Branch retinal vein occlusion of left eye, unspecified complication status  V61.6073 OCT, Retina - OU - Both Eyes    Color Fundus Photography Optos - OU - Both Eyes    1.  2.  3.  Ophthalmic Meds Ordered this visit:  No orders of the defined types were placed in this encounter.      Return in about 1 year (around 11/29/2020) for COLOR FP, DILATE OU, OCT.  There are no Patient Instructions on file for this visit.   Explained the diagnoses, plan, and follow up with the patient and they expressed understanding.  Patient expressed understanding of the importance of proper  follow up care.   Clent Demark Mariesa Grieder M.D. Diseases & Surgery of the Retina and Vitreous [facility] National 11/30/19     Abbreviations: M myopia (nearsighted); A astigmatism; H hyperopia (farsighted); P presbyopia; Mrx spectacle prescription;  CTL contact lenses; OD right eye; OS left eye; OU both eyes  XT exotropia; ET esotropia; PEK punctate epithelial keratitis; PEE punctate epithelial erosions; DES dry eye syndrome; MGD meibomian gland dysfunction; ATs artificial tears; PFAT's preservative free artificial tears; [place] nuclear sclerotic cataract; PSC posterior subcapsular cataract; ERM epi-retinal membrane; PVD posterior vitreous detachment; RD retinal detachment; DM  diabetes mellitus; DR diabetic retinopathy; NPDR non-proliferative diabetic retinopathy; PDR proliferative diabetic retinopathy; CSME clinically significant macular edema; DME diabetic macular edema; dbh dot blot hemorrhages; CWS cotton wool spot; POAG primary open angle glaucoma; C/D cup-to-disc ratio; HVF humphrey visual field; GVF goldmann visual field; OCT optical coherence tomography; IOP intraocular pressure; BRVO Branch retinal vein occlusion; CRVO central retinal vein occlusion; CRAO central retinal artery occlusion; BRAO branch retinal artery occlusion; RT retinal tear; SB scleral buckle; PPV pars plana vitrectomy; VH Vitreous hemorrhage; PRP panretinal laser photocoagulation; IVK intravitreal kenalog; VMT vitreomacular traction; MH Macular hole;  NVD neovascularization of the disc; NVE neovascularization elsewhere; AREDS age related eye disease study; ARMD age related macular degeneration; POAG primary open angle glaucoma; EBMD epithelial/anterior basement membrane dystrophy; ACIOL anterior chamber intraocular lens; IOL intraocular lens; PCIOL posterior chamber intraocular lens; Phaco/IOL phacoemulsification with intraocular lens placement; [place] photorefractive keratectomy; LASIK laser assisted in situ keratomileusis; HTN hypertension; DM diabetes mellitus; COPD chronic obstructive pulmonary disease

## 2019-12-17 ENCOUNTER — Other Ambulatory Visit: Payer: Self-pay | Admitting: Internal Medicine

## 2019-12-21 ENCOUNTER — Other Ambulatory Visit: Payer: Self-pay | Admitting: Internal Medicine

## 2019-12-21 DIAGNOSIS — I491 Atrial premature depolarization: Secondary | ICD-10-CM | POA: Diagnosis not present

## 2019-12-21 DIAGNOSIS — G4733 Obstructive sleep apnea (adult) (pediatric): Secondary | ICD-10-CM | POA: Diagnosis not present

## 2019-12-21 DIAGNOSIS — G479 Sleep disorder, unspecified: Secondary | ICD-10-CM | POA: Diagnosis not present

## 2019-12-21 DIAGNOSIS — G4700 Insomnia, unspecified: Secondary | ICD-10-CM | POA: Insufficient documentation

## 2019-12-21 MED ORDER — PANTOPRAZOLE SODIUM 40 MG PO TBEC: 40.0000 mg | DELAYED_RELEASE_TABLET | Freq: Every day | ORAL | 0 refills | Status: DC

## 2019-12-21 NOTE — Telephone Encounter (Signed)
One 90 day refill has been sent to the pharmacy. Patient must keep follow up.

## 2019-12-30 ENCOUNTER — Telehealth: Payer: Self-pay | Admitting: *Deleted

## 2019-12-30 NOTE — Telephone Encounter (Signed)
A detailed message was left,re: his follow up visit. °

## 2020-01-10 ENCOUNTER — Other Ambulatory Visit: Payer: Self-pay

## 2020-01-10 ENCOUNTER — Other Ambulatory Visit (HOSPITAL_COMMUNITY): Payer: Self-pay | Admitting: Cardiovascular Disease

## 2020-01-10 ENCOUNTER — Ambulatory Visit (HOSPITAL_COMMUNITY)
Admission: RE | Admit: 2020-01-10 | Discharge: 2020-01-10 | Disposition: A | Discharge status: Home / Self Care | Payer: Medicare Other | Source: Ambulatory Visit | Attending: Cardiology | Admitting: Cardiology

## 2020-01-10 DIAGNOSIS — I6529 Occlusion and stenosis of unspecified carotid artery: Secondary | ICD-10-CM | POA: Diagnosis not present

## 2020-01-10 DIAGNOSIS — I6523 Occlusion and stenosis of bilateral carotid arteries: Secondary | ICD-10-CM

## 2020-01-12 ENCOUNTER — Telehealth: Payer: Self-pay | Admitting: Cardiovascular Disease

## 2020-01-12 NOTE — Telephone Encounter (Signed)
Corey Fischer is returning Corey Fischer's call in regards to his results.

## 2020-01-12 NOTE — Telephone Encounter (Signed)
Returned call to patient regarding the following results: Vas Carotid Duplex Patient notified of the following :  [name], MD  01/10/2020 2:34 PM EDT     No change from prior study. Repeat in 12 months.    Patient verbalized understanding.

## 2020-01-12 NOTE — Telephone Encounter (Signed)
Attempted to return call to patient, left message for patient to call back.

## 2020-01-12 NOTE — Telephone Encounter (Signed)
   Pt is returning call from Bent, he said to call him on his mobile #

## 2020-01-14 ENCOUNTER — Telehealth: Payer: Self-pay

## 2020-01-14 DIAGNOSIS — I6523 Occlusion and stenosis of bilateral carotid arteries: Secondary | ICD-10-CM

## 2020-01-14 NOTE — Telephone Encounter (Signed)
Spoke to patient carotid doppler results given.Advised to repeat in 12 months. °

## 2020-01-23 ENCOUNTER — Other Ambulatory Visit: Payer: Self-pay | Admitting: Internal Medicine

## 2020-02-02 ENCOUNTER — Other Ambulatory Visit: Payer: Self-pay

## 2020-02-02 ENCOUNTER — Ambulatory Visit (INDEPENDENT_AMBULATORY_CARE_PROVIDER_SITE_OTHER): Payer: Medicare Other | Admitting: Cardiovascular Disease

## 2020-02-02 ENCOUNTER — Encounter: Payer: Self-pay | Admitting: Cardiovascular Disease

## 2020-02-02 VITALS — BP 140/70 | HR 54 | Ht 67.0 in | Wt 156.0 lb

## 2020-02-02 DIAGNOSIS — I6523 Occlusion and stenosis of bilateral carotid arteries: Secondary | ICD-10-CM | POA: Diagnosis not present

## 2020-02-02 DIAGNOSIS — E785 Hyperlipidemia, unspecified: Secondary | ICD-10-CM | POA: Diagnosis not present

## 2020-02-02 DIAGNOSIS — I10 Essential (primary) hypertension: Secondary | ICD-10-CM

## 2020-02-02 LAB — HEPATIC FUNCTION PANEL
ALT: 24 IU/L (ref 0–44)
AST: 20 IU/L (ref 0–40)
Albumin: 4.5 g/dL (ref 3.6–4.6)
Alkaline Phosphatase: 82 IU/L (ref 44–121)
Bilirubin Total: 0.6 mg/dL (ref 0.0–1.2)
Bilirubin, Direct: 0.19 mg/dL (ref 0.00–0.40)
Total Protein: 7.1 g/dL (ref 6.0–8.5)

## 2020-02-02 LAB — LIPID PANEL
Chol/HDL Ratio: 2.7 ratio (ref 0.0–5.0)
Cholesterol, Total: 133 mg/dL (ref 100–199)
HDL: 50 mg/dL (ref >39)
LDL Chol Calc (NIH): 62 mg/dL (ref 0–99)
Triglycerides: 118 mg/dL (ref 0–149)
VLDL Cholesterol Cal: 21 mg/dL (ref 5–40)

## 2020-02-02 NOTE — Assessment & Plan Note (Signed)
History of hyperlipidemia on statin therapy.  We will check a lipid liver profile this morning. 

## 2020-02-02 NOTE — Assessment & Plan Note (Signed)
History of mild bilateral ICA stenosis by recent duplex ultrasound performed 01/10/2020.  He has bilateral carotid bruits.  He is neurologically asymptomatic.  We will repeat in 12 months.

## 2020-02-02 NOTE — Progress Notes (Signed)
02/02/2020 Corey Fischer   October 31, 1931  [id number]  Primary Physician [name], MD Primary Cardiologist: [name] MD [name], [place]  HPI:  Corey Fischer is a 86 y.o.    thin and fit appearing married Caucasian male father of 2, grandfather to 4 grandchildren who was self-referred for evaluation of symptomatic PVCs.I last saw him in the office  01/26/2019.  Since I saw him a year ago he has moved into [facility] 09/18/2018 on the third floor which he is enjoying.  This is a retirement community, independent living.   He is retired from working at [employer] 7 years ago. His risk factors are remarkable for treated hypertension and hyperlipidemia. He does not smoke and drinks socially. His family history is remarkable for a mother who had atrial fibrillation and a father who had angina, but no one has had documented ischemic heart disease. He has never had an MI or a stroke and denies chest pain or shortness of breath. His surgical history is remarkable for remote TURP, cholecystectomy, and hernia repair.  He has had PVCs since graduate school and was on propranolol remotely, prescribed by Dr. Coralie Keens. Over the last 2 months he has had progressive PVCs which he is more aware of and symptomatic from. He has cut out his caffeine. Lab work performed by Dr. Arnoldo Morale revealed a normal TSH.  He saw [name] registered nurse practitioner back at the end of May 2014 because of palpitations have gotten worse. He was started on Cardizem 30 mg by mouth twice a day and his simvastatin was decreased from 40-20 mg a day. The 2-D echo is essentially normal. He has been checking his blood pressures which are somewhat sporadic but averaging 1:30 to 885 range systolic.  We have stopped his diltiazem which would cause contributing to his Wenkebach and symptoms and changed him to amlodipine. He had no further symptoms. He denies chest pain or shortness of breath. His lipid profile was  excellent on statin therapy last measured 05/07/13.  He saw [name] in our [employer] office 03/07/14 because of several days of symptom of palpitations. His EKG showed PACs. He was initially begun on metoprolol and later switched to low-dose atenolol resulted in improvement in his symptoms.  Since being seen back in 6 months ago he's remained currently stable. He gets occasional episodes of symptomatic PVCs which are self-limited. He denies chest pain or shortness of breath. He does have moderate bilateral internal carotid artery stenosis which we followed by duplex ultrasound. He has been seen in the emergency room several times for abdominal pain of unclear etiology. He did have an abdominal pelvic CTA which parenthetically showed severe aortoiliac atherosclerotic changes although he has 2+ pedal pulses and denies claudication.  Since I saw him83month ago  he continues to be active and exercises.  He plays pickle ball 3-4 times a week and [facility] [place] [facility] [place].  He is happy with the living circumstances.  He has really no palpitations and denies chest pain or shortness of breath.   Current Meds  Medication Sig  . amLODipine (NORVASC) 2.5 MG tablet TAKE 1 TABLET AT BEDTIME  . Ascorbic Acid (VITAMIN C) 1000 MG tablet Take 1,000 mg by mouth daily.   .Marland Kitchenaspirin 81 MG tablet Take 81 mg by mouth daily.  .Marland Kitchenatenolol (TENORMIN) 25 MG tablet TAKE 1/2 TABLET BY MOUTH ONCE DAILY  .  atorvastatin (LIPITOR) 20 MG tablet Take 1 tablet (20 mg total) by mouth daily.  . cetirizine (ZYRTEC) 10 MG tablet Take 10 mg by mouth daily with breakfast.   . Cholecalciferol (VITAMIN D3) 2000 units capsule Take 2,000 Units by mouth daily.  . Coenzyme Q10 200 MG TABS Take by mouth daily.   . Coenzyme Q10 50 MG TABS Take 1 tablet by mouth daily.  . Cyanocobalamin (VITAMIN B-12) 1000 MCG SUBL Place under the tongue.   . diazepam (VALIUM) 5 MG tablet Take 0.5-1  tablets (2.5-5 mg total) by mouth at bedtime as needed for anxiety (or sleep).  . Magnesium 200 MG TABS Take 200 mg by mouth daily.   . pantoprazole (PROTONIX) 40 MG tablet Take 1 tablet (40 mg total) by mouth daily.  . polyethylene glycol (MIRALAX / GLYCOLAX) packet Take 17 g by mouth daily.   . tamsulosin (FLOMAX) 0.4 MG CAPS capsule Take 0.4 mg by mouth.  . Zinc 25 MG TABS Take 25 mg by mouth daily.      Allergies  Allergen Reactions  . Ciprofloxacin Other (See Comments)    Unknown reaction    Social History   Socioeconomic History  . Marital status: Married    Spouse name: Not on file  . Number of children: 2  . Years of education: Not on file  . Highest education level: Not on file  Occupational History  . Occupation: retired    Fish farm manager: RETIRED  Tobacco Use  . Smoking status: Never Smoker  . Smokeless tobacco: Never Used  Vaping Use  . Vaping Use: Never used  Substance and Sexual Activity  . Alcohol use: Yes    Comment: occasionally  . Drug use: No  . Sexual activity: Yes  Other Topics Concern  . Not on file  Social History Narrative   Grew up in small town in [place]. 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 Met wife in [place]. At 60, got married and went to college-undergrad in [employer] as well as Oceanographer, doctorate in Location manager.  Worked at [employer] which eventually became [employer]. Lived in [facility] [place] and [place] before eventually moving to [place]. 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       Married over 50 years. 18 years 01/2014. 2 married children with 2 children each. 1 son near [place]. 1 son in [place] with house of rep.       Hobbies: work out most mornings 5 days a week (spin, elipitical), racquetball until 2014 after hip surgery.    Social Determinants of Health   Financial Resource Strain:   . Difficulty of Paying Living Expenses: Not on file  Food Insecurity:   . Worried About Charity fundraiser in the  Last Year: Not on file  . Ran Out of Food in the Last Year: Not on file  Transportation Needs:   . Lack of Transportation (Medical): Not on file  . Lack of Transportation (Non-Medical): Not on file  Physical Activity:   . Days of Exercise per Week: Not on file  . Minutes of Exercise per Session: Not on file  Stress:   . Feeling of Stress : Not on file  Social Connections:   . Frequency of Communication with Friends and Family: Not on file  . Frequency of Social Gatherings with Friends and Family: Not on file  . Attends Religious Services: Not on file  . Active Member of Clubs or Organizations: Not on  file  . Attends Archivist Meetings: Not on file  . Marital Status: Not on file  Intimate Partner Violence:   . Fear of Current or Ex-Partner: Not on file  . Emotionally Abused: Not on file  . Physically Abused: Not on file  . Sexually Abused: Not on file     Review of Systems: General: negative for chills, fever, night sweats or weight changes.  Cardiovascular: negative for chest pain, dyspnea on exertion, edema, orthopnea, palpitations, paroxysmal nocturnal dyspnea or shortness of breath Dermatological: negative for rash Respiratory: negative for cough or wheezing Urologic: negative for hematuria Abdominal: negative for nausea, vomiting, diarrhea, bright red blood per rectum, melena, or hematemesis Neurologic: negative for visual changes, syncope, or dizziness All other systems reviewed and are otherwise negative except as noted above.    Blood pressure 140/70, pulse (!) 54, height 5' 7"  (1.702 m), weight 156 lb (70.8 kg), SpO2 96 %.  General appearance: alert and no distress Neck: no adenopathy, no JVD, supple, symmetrical, trachea midline, thyroid not enlarged, symmetric, no tenderness/mass/nodules and Bilateral carotid bruits Lungs: clear to auscultation bilaterally Heart: regular rate and rhythm, S1, S2 normal, no murmur, click, rub or gallop Extremities:  extremities normal, atraumatic, no cyanosis or edema Pulses: 2+ and symmetric Skin: Skin color, texture, turgor normal. No rashes or lesions Neurologic: Alert and oriented X 3, normal strength and tone. Normal symmetric reflexes. Normal coordination and gait  EKG sinus bradycardia 54 with nonspecific ST and T wave changes.  I personally reviewed this EKG.  ASSESSMENT AND PLAN:   Hyperlipidemia History of hyperlipidemia on statin therapy.  We will check a lipid liver profile this morning  Essential hypertension History of essential hypertension with blood pressure measured today at 140/70.  He is on atenolol.  Carotid artery stenosis History of mild bilateral ICA stenosis by recent duplex ultrasound performed 01/10/2020.  He has bilateral carotid bruits.  He is neurologically asymptomatic.  We will repeat in 12 months.      [name] MD FACP,FACC,FAHA, [facility] 02/02/2020 9:33 AM

## 2020-02-02 NOTE — Assessment & Plan Note (Signed)
History of essential hypertension with blood pressure measured today at 140/70.  He is on atenolol.

## 2020-02-02 NOTE — Patient Instructions (Addendum)
Medication Instructions:  No Changes In Medications at this time.  *If you need a refill on your cardiac medications before your next appointment, please call your pharmacy*  Lab Work: Lipid and Liver blood work today  If you have labs (blood work) drawn today and your tests are completely normal, you will receive your results only by: [name] MyChart Message (if you have MyChart) OR . A paper copy in the mail If you have any lab test that is abnormal or we need to change your treatment, we will call you to review the results.  Testing/Procedures:  Your physician has requested that you have a carotid duplex in ONE YEAR. This test is an ultrasound of the carotid arteries in your neck. It looks at blood flow through these arteries that supply the brain with blood. Allow one hour for this exam. There are no restrictions or special instructions.   Follow-Up: At [facility], you and your health needs are our priority.  As part of our continuing mission to provide you with exceptional heart care, we have created designated Provider Care Teams.  These Care Teams include your primary Cardiologist (physician) and Advanced Practice Providers (APPs -  Physician Assistants and Nurse Practitioners) who all work together to provide you with the care you need, when you need it.  Your next appointment:   1 year(s)  The format for your next appointment:   In Person  Provider:   Quay Burow, MD

## 2020-02-03 DIAGNOSIS — G4733 Obstructive sleep apnea (adult) (pediatric): Secondary | ICD-10-CM | POA: Diagnosis not present

## 2020-02-03 DIAGNOSIS — I491 Atrial premature depolarization: Secondary | ICD-10-CM | POA: Diagnosis not present

## 2020-02-03 DIAGNOSIS — G4700 Insomnia, unspecified: Secondary | ICD-10-CM | POA: Diagnosis not present

## 2020-02-18 DIAGNOSIS — L814 Other melanin hyperpigmentation: Secondary | ICD-10-CM | POA: Diagnosis not present

## 2020-02-18 DIAGNOSIS — D1801 Hemangioma of skin and subcutaneous tissue: Secondary | ICD-10-CM | POA: Diagnosis not present

## 2020-02-18 DIAGNOSIS — L820 Inflamed seborrheic keratosis: Secondary | ICD-10-CM | POA: Diagnosis not present

## 2020-02-18 DIAGNOSIS — L570 Actinic keratosis: Secondary | ICD-10-CM | POA: Diagnosis not present

## 2020-02-19 DIAGNOSIS — Z23 Encounter for immunization: Secondary | ICD-10-CM | POA: Diagnosis not present

## 2020-02-22 ENCOUNTER — Ambulatory Visit (INDEPENDENT_AMBULATORY_CARE_PROVIDER_SITE_OTHER): Payer: Medicare Other | Admitting: Internal Medicine

## 2020-02-22 ENCOUNTER — Encounter: Payer: Self-pay | Admitting: Internal Medicine

## 2020-02-22 VITALS — BP 126/60 | HR 46 | Ht 67.0 in | Wt 155.0 lb

## 2020-02-22 DIAGNOSIS — K219 Gastro-esophageal reflux disease without esophagitis: Secondary | ICD-10-CM

## 2020-02-22 DIAGNOSIS — I6523 Occlusion and stenosis of bilateral carotid arteries: Secondary | ICD-10-CM | POA: Diagnosis not present

## 2020-02-22 DIAGNOSIS — K222 Esophageal obstruction: Secondary | ICD-10-CM | POA: Diagnosis not present

## 2020-02-22 DIAGNOSIS — K5909 Other constipation: Secondary | ICD-10-CM | POA: Diagnosis not present

## 2020-02-22 MED ORDER — PANTOPRAZOLE SODIUM 40 MG PO TBEC
40.0000 mg | DELAYED_RELEASE_TABLET | Freq: Every day | ORAL | 2 refills | Status: DC
Start: 2020-02-22 — End: 2020-12-07

## 2020-02-22 NOTE — Patient Instructions (Signed)
We have sent the following medications to your pharmacy for you to pick up at your convenience: Pantoprazole 40 mg daily  If you are age 84 or older, your body mass index should be between 23-30. Your Body mass index is 24.28 kg/m. If this is out of the aforementioned range listed, please consider follow up with your Primary Care Provider.  If you are age 56 or younger, your body mass index should be between 19-25. Your Body mass index is 24.28 kg/m. If this is out of the aformentioned range listed, please consider follow up with your Primary Care Provider.   Due to recent changes in healthcare laws, you may see the results of your imaging and laboratory studies on MyChart before your provider has had a chance to review them.  We understand that in some cases there may be results that are confusing or concerning to you. Not all laboratory results come back in the same time frame and the provider may be waiting for multiple results in order to interpret others.  Please give [place] 48 hours in order for your provider to thoroughly review all the results before contacting the office for clarification of your results.

## 2020-02-23 ENCOUNTER — Encounter: Payer: Self-pay | Admitting: Internal Medicine

## 2020-02-23 NOTE — Progress Notes (Signed)
   Subjective:    Patient ID: CHEVY SWEIGERT, male    DOB: 07-22-1931, 86 y.o.   MRN: [id number]  HPI Jaivian Battaglini is an 86 year old male with PMH of GERD, Schatzki's ring with dilation in 2016, colon polyps, history of irritable bowel syndrome, possible SIBO, duodenal diverticulum, resolved small pancreatic cyst is here for follow-up.  He is here today with his wife and was last seen in October 2019.  He reports that he has been doing well.  His reflux is well controlled.  He has pill dysphagia but only with 1 capsule specifically.  The rest of his pills along with solid and liquid food transit into the stomach without issue.  No odynophagia.  Very rare if ever breakthrough heartburn.  No abdominal pain.  Regular bowel movements with the MiraLAX which he uses daily.  No blood in stool or melena.   Review of Systems As per HPI, otherwise negative  Current Medications, Allergies, Past Medical History, Past Surgical History, Family History and Social History were reviewed in [employer] record.      Objective:   Physical Exam BP 126/60   Pulse (!) 46   Ht 5\' 7"  (1.702 m)   Wt 155 lb (70.3 kg)   SpO2 (!) 88%   BMI 24.28 kg/m  Gen: awake, alert, NAD HEENT: anicteric Heart: RRR Pulm: CTA b/l Abd: soft, NT/ND, +BS throughout Ext: no c/c/e Neuro: nonfocal      Assessment & Plan:  86 year old male with PMH of GERD, Schatzki's ring with dilation in 2016, colon polyps, history of irritable bowel syndrome, possible SIBO, duodenal diverticulum, resolved small pancreatic cyst is here for follow-up.   1.  GERD --well controlled with pantoprazole, continue 40 mg daily.  No alarm symptoms.  2.  Schatzki's ring --pill dysphagia but only with co-Q10 capsule.  This is likely the result of the size and very light weight of this capsule.  He has changed to a gummy formulation for co-Q10.  No other dysphagia to warrant repeat endoscopy with dilation.  He will let me know if this  changes.  3.  Chronic constipation --continue MiraLAX 17 g daily  20 minutes total spent today including patient facing time, coordination of care, reviewing medical history/procedures/pertinent radiology studies, and documentation of the encounter.

## 2020-03-21 DIAGNOSIS — I491 Atrial premature depolarization: Secondary | ICD-10-CM | POA: Diagnosis not present

## 2020-03-21 DIAGNOSIS — G4733 Obstructive sleep apnea (adult) (pediatric): Secondary | ICD-10-CM | POA: Diagnosis not present

## 2020-03-22 DIAGNOSIS — E785 Hyperlipidemia, unspecified: Secondary | ICD-10-CM | POA: Diagnosis not present

## 2020-03-22 DIAGNOSIS — D696 Thrombocytopenia, unspecified: Secondary | ICD-10-CM | POA: Diagnosis not present

## 2020-03-22 DIAGNOSIS — E538 Deficiency of other specified B group vitamins: Secondary | ICD-10-CM | POA: Diagnosis not present

## 2020-03-22 DIAGNOSIS — M8580 Other specified disorders of bone density and structure, unspecified site: Secondary | ICD-10-CM | POA: Diagnosis not present

## 2020-03-22 DIAGNOSIS — I6529 Occlusion and stenosis of unspecified carotid artery: Secondary | ICD-10-CM | POA: Diagnosis not present

## 2020-03-22 DIAGNOSIS — I10 Essential (primary) hypertension: Secondary | ICD-10-CM | POA: Diagnosis not present

## 2020-03-22 DIAGNOSIS — Z0189 Encounter for other specified special examinations: Secondary | ICD-10-CM | POA: Diagnosis not present

## 2020-03-22 DIAGNOSIS — R739 Hyperglycemia, unspecified: Secondary | ICD-10-CM | POA: Diagnosis not present

## 2020-03-28 DIAGNOSIS — I6529 Occlusion and stenosis of unspecified carotid artery: Secondary | ICD-10-CM | POA: Diagnosis not present

## 2020-03-28 DIAGNOSIS — K219 Gastro-esophageal reflux disease without esophagitis: Secondary | ICD-10-CM | POA: Diagnosis not present

## 2020-03-28 DIAGNOSIS — G4700 Insomnia, unspecified: Secondary | ICD-10-CM | POA: Diagnosis not present

## 2020-03-28 DIAGNOSIS — T7840XA Allergy, unspecified, initial encounter: Secondary | ICD-10-CM | POA: Insufficient documentation

## 2020-03-28 DIAGNOSIS — R739 Hyperglycemia, unspecified: Secondary | ICD-10-CM | POA: Diagnosis not present

## 2020-03-28 DIAGNOSIS — I493 Ventricular premature depolarization: Secondary | ICD-10-CM | POA: Diagnosis not present

## 2020-03-28 DIAGNOSIS — T7840XS Allergy, unspecified, sequela: Secondary | ICD-10-CM | POA: Diagnosis not present

## 2020-03-28 DIAGNOSIS — D696 Thrombocytopenia, unspecified: Secondary | ICD-10-CM | POA: Diagnosis not present

## 2020-03-28 DIAGNOSIS — I10 Essential (primary) hypertension: Secondary | ICD-10-CM | POA: Diagnosis not present

## 2020-03-28 DIAGNOSIS — Z0000 Encounter for general adult medical examination without abnormal findings: Secondary | ICD-10-CM | POA: Diagnosis not present

## 2020-03-28 DIAGNOSIS — G4733 Obstructive sleep apnea (adult) (pediatric): Secondary | ICD-10-CM | POA: Diagnosis not present

## 2020-03-28 DIAGNOSIS — I491 Atrial premature depolarization: Secondary | ICD-10-CM | POA: Diagnosis not present

## 2020-03-28 DIAGNOSIS — E785 Hyperlipidemia, unspecified: Secondary | ICD-10-CM | POA: Diagnosis not present

## 2020-03-28 DIAGNOSIS — Z96642 Presence of left artificial hip joint: Secondary | ICD-10-CM | POA: Diagnosis not present

## 2020-04-25 ENCOUNTER — Other Ambulatory Visit: Payer: Self-pay | Admitting: Cardiovascular Disease

## 2020-04-26 ENCOUNTER — Encounter (INDEPENDENT_AMBULATORY_CARE_PROVIDER_SITE_OTHER): Payer: Self-pay

## 2020-04-26 DIAGNOSIS — H40013 Open angle with borderline findings, low risk, bilateral: Secondary | ICD-10-CM | POA: Diagnosis not present

## 2020-04-26 DIAGNOSIS — H04123 Dry eye syndrome of bilateral lacrimal glands: Secondary | ICD-10-CM | POA: Diagnosis not present

## 2020-04-26 DIAGNOSIS — H35033 Hypertensive retinopathy, bilateral: Secondary | ICD-10-CM | POA: Diagnosis not present

## 2020-04-26 DIAGNOSIS — H348122 Central retinal vein occlusion, left eye, stable: Secondary | ICD-10-CM | POA: Diagnosis not present

## 2020-04-28 DIAGNOSIS — L814 Other melanin hyperpigmentation: Secondary | ICD-10-CM | POA: Diagnosis not present

## 2020-04-28 DIAGNOSIS — L723 Sebaceous cyst: Secondary | ICD-10-CM | POA: Diagnosis not present

## 2020-04-28 DIAGNOSIS — L570 Actinic keratosis: Secondary | ICD-10-CM | POA: Diagnosis not present

## 2020-05-30 ENCOUNTER — Other Ambulatory Visit: Payer: Self-pay | Admitting: Cardiovascular Disease

## 2020-06-20 ENCOUNTER — Encounter: Payer: Self-pay | Admitting: Internal Medicine

## 2020-09-04 ENCOUNTER — Encounter: Payer: Self-pay | Admitting: *Deleted

## 2020-09-04 ENCOUNTER — Ambulatory Visit (INDEPENDENT_AMBULATORY_CARE_PROVIDER_SITE_OTHER): Payer: Medicare Other | Admitting: Internal Medicine

## 2020-09-04 ENCOUNTER — Encounter: Payer: Self-pay | Admitting: Internal Medicine

## 2020-09-04 VITALS — BP 144/60 | HR 56 | Ht 67.0 in | Wt 157.8 lb

## 2020-09-04 DIAGNOSIS — R1030 Lower abdominal pain, unspecified: Secondary | ICD-10-CM

## 2020-09-04 DIAGNOSIS — K219 Gastro-esophageal reflux disease without esophagitis: Secondary | ICD-10-CM

## 2020-09-04 DIAGNOSIS — K635 Polyp of colon: Secondary | ICD-10-CM | POA: Diagnosis not present

## 2020-09-04 DIAGNOSIS — R140 Abdominal distension (gaseous): Secondary | ICD-10-CM

## 2020-09-04 DIAGNOSIS — Z86010 Personal history of colonic polyps: Secondary | ICD-10-CM | POA: Diagnosis not present

## 2020-09-04 NOTE — Patient Instructions (Signed)
Continue Miralax.  Continue pantoprazole.  You have been scheduled for a CT virtual colonoscopy at [facility] ( [address] location). Your appointment is scheduled for Thursday, 09/28/20 at 9:00 am, 8:50 am arrival. Please go to [facility] at least 72 hours prior to your exam to get your prep and instructions. If you need to reschedule your appointment or have specific questions regarding this test, please contact [facility] at [phone].   If you are age 17 or older, your body mass index should be between 23-30. Your Body mass index is 24.71 kg/m. If this is out of the aforementioned range listed, please consider follow up with your Primary Care Provider.  Due to recent changes in healthcare laws, you may see the results of your imaging and laboratory studies on MyChart before your provider has had a chance to review them.  We understand that in some cases there may be results that are confusing or concerning to you. Not all laboratory results come back in the same time frame and the provider may be waiting for multiple results in order to interpret others.  Please give [place] 48 hours in order for your provider to thoroughly review all the results before contacting the office for clarification of your results.

## 2020-09-04 NOTE — Progress Notes (Signed)
Subjective:    Patient ID: Corey Fischer, male    DOB: 1932/04/28, 87 y.o.   MRN: [id number]  HPI Corey Fischer is an 87 year old male with a history of GERD, Schatzki's ring with prior dilation in 2016, history of colon polyps with last colonoscopy in 2016, history of IBS, possible SIBO, duodenal diverticulum, resolved small pancreatic cyst who is here for follow-up.  He is here today with his wife.  I last saw him in October 2021.  He reports that he has been doing fairly well.  He has noticed some lower abdominal pain which is present with waking in the morning.  Seems to go away after getting up.  He wonders if this could be related to his colon or prior mesh placement for hernia repair.  He has noticed some fluctuation in stool color from dark to light.  Stools are mostly formed but can be loose.  His bloating sensation has come back somewhat.  When he experiences the abdominal pain it can last for 2 or 3 hours or up to a day and a half.  He is using MiraLAX on a daily basis which he attributes to some of the bloating.  No blood in stool or melena.  His pantoprazole is working well for his reflux.  He was having issues swallowing his co-Q10 he change this to a gummy formulation.  No solid or liquid dysphagia.  No odynophagia.  He received a letter from Dr. Henrene Pastor stating he would not need another colonoscopy based on his age.  He has questions about this.   Review of Systems As per HPI, otherwise negative  Current Medications, Allergies, Past Medical History, Past Surgical History, Family History and Social History were reviewed in [employer] record.     Objective:   Physical Exam BP (!) 144/60   Pulse (!) 56   Ht 5\' 7"  (1.702 m)   Wt 157 lb 12.8 oz (71.6 kg)   BMI 24.71 kg/m  Gen: awake, alert, NAD HEENT: anicteric CV: RRR, no mrg Pulm: CTA b/l Abd: soft, mildly tender in the mid lower abdomen without rebound or guarding, ND, +BS throughout Ext: no  c/c/e Neuro: nonfocal      Assessment & Plan:  87 year old male with a history of GERD, Schatzki's ring with prior dilation in 2016, history of colon polyps with last colonoscopy in 2016, history of IBS, possible SIBO, duodenal diverticulum, resolved small pancreatic cyst who is here for follow-up.   1.  Lower abdominal pain/history of constipation/history of colon polyps/hx of SIBO based on previous response to rifaximin/history of constipation -- we discussed his symptoms today including his lower abdominal pain, change in bowel pattern.  We also discussed colonoscopy for surveillance.  He had questions regarding the risk based on his age.  We discussed that risk of perforation does go up with age though certainly most of the time colonoscopy can be performed safely.  The benefit for surveillance later in life also is lower based on overall average life expectancy.  He is certainly doing very very well at age 73 and remains robust.  After our discussion he would prefer a less invasive test and so we will proceed with virtual colonoscopy. -- Virtual colonoscopy; this also has the benefit of abdominal imaging giving lower abdominal pain -- If negative then repeat course of rifaximin 550 mg 3 times daily for 10 to 14 days -- If positive then colonoscopy -- Continue MiraLAX daily  2.  GERD with  history of Schatzki's ring --continue pantoprazole 40 mg daily.  No alarm symptoms.  No progressive dysphagia to warrant repeat upper endoscopy with dilation at this time  30 minutes total spent today including patient facing time, coordination of care, reviewing medical history/procedures/pertinent radiology studies, and documentation of the encounter.

## 2020-09-28 ENCOUNTER — Ambulatory Visit
Admission: RE | Admit: 2020-09-28 | Discharge: 2020-09-28 | Disposition: A | Discharge status: Home / Self Care | Payer: Medicare Other | Source: Ambulatory Visit | Attending: Internal Medicine | Admitting: Internal Medicine

## 2020-09-28 DIAGNOSIS — K635 Polyp of colon: Secondary | ICD-10-CM

## 2020-09-28 DIAGNOSIS — R1030 Lower abdominal pain, unspecified: Secondary | ICD-10-CM

## 2020-09-28 DIAGNOSIS — Z860100 Personal history of colon polyps, unspecified: Secondary | ICD-10-CM

## 2020-09-28 DIAGNOSIS — R140 Abdominal distension (gaseous): Secondary | ICD-10-CM

## 2020-09-29 ENCOUNTER — Other Ambulatory Visit: Payer: Self-pay

## 2020-09-29 DIAGNOSIS — K635 Polyp of colon: Secondary | ICD-10-CM

## 2020-10-03 DIAGNOSIS — R7303 Prediabetes: Secondary | ICD-10-CM | POA: Insufficient documentation

## 2020-10-09 ENCOUNTER — Encounter: Payer: Self-pay | Admitting: *Deleted

## 2020-10-19 ENCOUNTER — Ambulatory Visit (INDEPENDENT_AMBULATORY_CARE_PROVIDER_SITE_OTHER): Payer: Medicare Other | Admitting: Internal Medicine

## 2020-10-19 ENCOUNTER — Encounter: Payer: Self-pay | Admitting: Internal Medicine

## 2020-10-19 VITALS — BP 136/52 | HR 56 | Ht 66.0 in | Wt 152.1 lb

## 2020-10-19 DIAGNOSIS — R933 Abnormal findings on diagnostic imaging of other parts of digestive tract: Secondary | ICD-10-CM | POA: Diagnosis not present

## 2020-10-19 DIAGNOSIS — Z86010 Personal history of colonic polyps: Secondary | ICD-10-CM | POA: Diagnosis not present

## 2020-10-19 DIAGNOSIS — K219 Gastro-esophageal reflux disease without esophagitis: Secondary | ICD-10-CM

## 2020-10-19 DIAGNOSIS — K5909 Other constipation: Secondary | ICD-10-CM | POA: Diagnosis not present

## 2020-10-19 MED ORDER — PLENVU 140 G PO SOLR: 1.0000 | ORAL | 0 refills | Status: DC

## 2020-10-19 NOTE — Progress Notes (Signed)
Subjective:    Patient ID: Corey Fischer, male    DOB: 31-Jan-1932, 87 y.o.   MRN: [id number]  HPI Corey Fischer is an 87 year old male with a history of GERD, Schatzki's ring dilated in 2016, history of colon polyps with last colonoscopy in 2016, history of IBS, possible SIBO, duodenal diverticulum, resolved small pancreatic cyst who is here for follow-up.  He is here today with his wife and I saw him in April 2022.  After his last appointment and discussion of lower abdominal pain and history of colon polyps we decided to do a virtual colonoscopy.  Virtual colonoscopy was performed on 09/28/2020.  We reviewed this today.  It showed a small persistent nondependent 4 mm polypoid filling defect within the proximal to mid transverse colon.  No other concerning colonic or bowel findings.  There was a very small right kidney stone without hydronephrosis.  There was diffuse aortic atherosclerosis without aneurysm.  His pancreas looked normal without cystic lesion.  He reports recently he is actually been feeling well.  He has not had any further issues with lower abdominal pain.  GERD continues to be well controlled on pantoprazole 40 mg daily.  No progressive dysphagia.  Bowel habits have been more regular without blood or melena.  He continues daily MiraLAX   Review of Systems As per HPI, otherwise negative  Current Medications, Allergies, Past Medical History, Past Surgical History, Family History and Social History were reviewed in [employer] record.     Objective:   Physical Exam BP (!) 136/52 (BP Location: Left Arm, Patient Position: Sitting, Cuff Size: Normal)   Pulse (!) 56   Ht 5\' 6"  (1.676 m) Comment: height measured without shoes  Wt 152 lb 2 oz (69 kg)   BMI 24.55 kg/m  Gen: awake, alert, NAD HEENT: anicteric Neuro: nonfocal  CT VIRTUAL COLONOSCOPY DIAGNOSTIC   TECHNIQUE: The patient was given a standard bowel preparation with Gastrografin and barium for  fluid and stool tagging respectively. The quality of the bowel preparation is moderate. Automated CO2 insufflation of the colon was performed prior to image acquisition and colonic distention is good. Image post processing was used to generate a 3D endoluminal fly-through projection of the colon and to electronically subtract stool/fluid as appropriate.   COMPARISON:  None   FINDINGS: VIRTUAL COLONOSCOPY   There is a small persistent polypoid lesion which is non dependent within the proximal to mid transverse colon measuring 4 mm. No other polypoid filling defects are seen. No annular constricting lesions. Moderate retained layering barium throughout the colon. Tortuous colon.   Virtual colonoscopy is not designed to detect diminutive polyps (i.e., less than or equal to 5 mm), the presence or absence of which may not affect clinical management.   CT ABDOMEN AND PELVIS WITHOUT CONTRAST   Lower chest: Scarring in the lung bases. No effusions. Coronary artery and aortic calcifications.   Hepatobiliary: Prior cholecystectomy.  No focal hepatic abnormality.   Pancreas: No pancreatic lesion or ductal dilatation.   Spleen: Spleen is normal size.  No focal abnormality.   Adrenals/Urinary Tract: Adrenal glands normal. Punctate 1-2 mm nonobstructing stone in the midpole of the right kidney. No stones on the left. No hydronephrosis. Urinary bladder decompressed, grossly unremarkable.   Stomach/Bowel: Stomach and small bowel decompressed, grossly unremarkable.   Vascular/Lymphatic: Diffuse aortic atherosclerosis. No aneurysm or adenopathy.   Reproductive: Prostate obscured by beam hardening artifact from the left hip replacement.   Other: No free fluid or  free air.   Musculoskeletal: No acute bony abnormality.   IMPRESSION: Small persistent non dependent 4 mm polypoid filling defect within the proximal to mid transverse colon.   Diffuse aortic atherosclerosis.  No  aneurysm.   Right nephrolithiasis.  No hydronephrosis.     Electronically Signed   By: Rolm Baptise M.D.   On: 09/28/2020 10:30     Assessment & Plan:  87 year old male with a history of GERD, Schatzki's ring dilated in 2016, history of colon polyps with last colonoscopy in 2016, history of IBS, possible SIBO, duodenal diverticulum, resolved small pancreatic cyst who is here for follow-up.  1.  History of colon polyps/abnormal virtual colonoscopy --we discussed the virtual colonoscopy which showed a very small polypoid lesion in the transverse colon.  We discussed that this very well may be a polyp and given his history I recommended we repeat colonoscopy.  We reviewed the risk, benefits and alternatives and he is agreeable and wishes to proceed -- Colonoscopy in the [place]  2.  GERD with history of Schatzki's ring --stable without dysphagia.  Continue pantoprazole 40 mg daily  3.  Chronic constipation --continue daily MiraLAX  20 minutes total spent today including patient facing time, coordination of care, reviewing medical history/procedures/pertinent radiology studies, and documentation of the encounter.

## 2020-10-19 NOTE — Patient Instructions (Signed)
You have been scheduled for a colonoscopy. Please follow written instructions given to you at your visit today.  Please pick up your prep supplies at the pharmacy within the next 1-3 days. If you use inhalers (even only as needed), please bring them with you on the day of your procedure.  If you are age 85 or older, your body mass index should be between 23-30. Your Body mass index is 24.55 kg/m. If this is out of the aforementioned range listed, please consider follow up with your Primary Care Provider. ________________________________________  The [facility] providers would like to encourage you to use [facility] to communicate with providers for non-urgent requests or questions.  Due to long hold times on the telephone, sending your provider a message by [facility] may be a faster and more efficient way to get a response.  Please allow 48 business hours for a response.  Please remember that this is for non-urgent requests.   Due to recent changes in healthcare laws, you may see the results of your imaging and laboratory studies on MyChart before your provider has had a chance to review them.  We understand that in some cases there may be results that are confusing or concerning to you. Not all laboratory results come back in the same time frame and the provider may be waiting for multiple results in order to interpret others.  Please give [place] 48 hours in order for your provider to thoroughly review all the results before contacting the office for clarification of your results.

## 2020-10-26 ENCOUNTER — Encounter: Payer: Self-pay | Admitting: Internal Medicine

## 2020-10-26 ENCOUNTER — Other Ambulatory Visit: Payer: Self-pay

## 2020-10-26 ENCOUNTER — Ambulatory Visit (AMBULATORY_SURGERY_CENTER): Payer: Medicare Other | Admitting: Internal Medicine

## 2020-10-26 VITALS — BP 154/67 | HR 52 | Temp 97.9°F | Resp 19 | Ht 66.0 in | Wt 152.0 lb

## 2020-10-26 DIAGNOSIS — D120 Benign neoplasm of cecum: Secondary | ICD-10-CM

## 2020-10-26 DIAGNOSIS — R933 Abnormal findings on diagnostic imaging of other parts of digestive tract: Secondary | ICD-10-CM | POA: Diagnosis present

## 2020-10-26 DIAGNOSIS — D125 Benign neoplasm of sigmoid colon: Secondary | ICD-10-CM | POA: Diagnosis not present

## 2020-10-26 DIAGNOSIS — K5730 Diverticulosis of large intestine without perforation or abscess without bleeding: Secondary | ICD-10-CM | POA: Diagnosis not present

## 2020-10-26 DIAGNOSIS — D123 Benign neoplasm of transverse colon: Secondary | ICD-10-CM

## 2020-10-26 DIAGNOSIS — K648 Other hemorrhoids: Secondary | ICD-10-CM

## 2020-10-26 MED ORDER — SODIUM CHLORIDE 0.9 % IV SOLN: 500.0000 mL | Freq: Once | INTRAVENOUS | Status: DC

## 2020-10-26 NOTE — Progress Notes (Signed)
Called to room to assist during endoscopic procedure.  Patient ID and intended procedure confirmed with present staff. Received instructions for my participation in the procedure from the performing physician.  

## 2020-10-26 NOTE — Patient Instructions (Signed)
Resume previous diet and medications. Awaiting pathology results. No repeat colonoscopy due to age.  YOU HAD AN ENDOSCOPIC PROCEDURE TODAY AT [facility]:   Refer to the procedure report that was given to you for any specific questions about what was found during the examination.  If the procedure report does not answer your questions, please call your gastroenterologist to clarify.  If you requested that your care partner not be given the details of your procedure findings, then the procedure report has been included in a sealed envelope for you to review at your convenience later.  YOU SHOULD EXPECT: Some feelings of bloating in the abdomen. Passage of more gas than usual.  Walking can help get rid of the air that was put into your GI tract during the procedure and reduce the bloating. If you had a lower endoscopy (such as a colonoscopy or flexible sigmoidoscopy) you may notice spotting of blood in your stool or on the toilet paper. If you underwent a bowel prep for your procedure, you may not have a normal bowel movement for a few days.  Please Note:  You might notice some irritation and congestion in your nose or some drainage.  This is from the oxygen used during your procedure.  There is no need for concern and it should clear up in a day or so.  SYMPTOMS TO REPORT IMMEDIATELY:  Following lower endoscopy (colonoscopy or flexible sigmoidoscopy):  Excessive amounts of blood in the stool  Significant tenderness or worsening of abdominal pains  Swelling of the abdomen that is new, acute  Fever of 100F or higher    For urgent or emergent issues, a gastroenterologist can be reached at any hour by calling [phone]. Do not use MyChart messaging for urgent concerns.    DIET:  We do recommend a small meal at first, but then you may proceed to your regular diet.  Drink plenty of fluids but you should avoid alcoholic beverages for 24 hours.  ACTIVITY:  You should plan to  take it easy for the rest of today and you should NOT DRIVE or use heavy machinery until tomorrow (because of the sedation medicines used during the test).    FOLLOW UP: Our staff will call the number listed on your records 48-72 hours following your procedure to check on you and address any questions or concerns that you may have regarding the information given to you following your procedure. If we do not reach you, we will leave a message.  We will attempt to reach you two times.  During this call, we will ask if you have developed any symptoms of COVID 19. If you develop any symptoms (ie: fever, flu-like symptoms, shortness of breath, cough etc.) before then, please call [phone].  If you test positive for Covid 19 in the 2 weeks post procedure, please call and report this information to [place].    If any biopsies were taken you will be contacted by phone or by letter within the next 1-3 weeks.  Please call us at [phone] if you have not heard about the biopsies in 3 weeks.    SIGNATURES/CONFIDENTIALITY: You and/or your care partner have signed paperwork which will be entered into your electronic medical record.  These signatures attest to the fact that that the information above on your After Visit Summary has been reviewed and is understood.  Full responsibility of the confidentiality of this discharge information lies with you and/or your care-partner.

## 2020-10-26 NOTE — Progress Notes (Signed)
Report to PACU, RN, vss, BBS= Clear.  

## 2020-10-26 NOTE — Progress Notes (Signed)
Pt's states no medical or surgical changes since previsit or office visit. 

## 2020-10-26 NOTE — Op Note (Signed)
[place] Patient Name: Corey Fischer Procedure Date: 10/26/2020 10:55 AM MRN: [id number] Endoscopist: Jerene Bears , MD Age: 87 Referring MD:  Date of Birth: Dec 06, 1931 Gender: Male Account #: [id number] Procedure:                Colonoscopy Indications:              Abnormal virtual colonoscopy (with polypoid lesion                            in transverse colon), remote colonic polyps Medicines:                Monitored Anesthesia Care Procedure:                Pre-Anesthesia Assessment:                           - Prior to the procedure, a History and Physical                            was performed, and patient medications and                            allergies were reviewed. The patient's tolerance of                            previous anesthesia was also reviewed. The risks                            and benefits of the procedure and the sedation                            options and risks were discussed with the patient.                            All questions were answered, and informed consent                            was obtained. Prior Anticoagulants: The patient has                            taken no previous anticoagulant or antiplatelet                            agents. ASA Grade Assessment: II - A patient with                            mild systemic disease. After reviewing the risks                            and benefits, the patient was deemed in                            satisfactory condition to undergo the procedure.  After obtaining informed consent, the colonoscope                            was passed under direct vision. Throughout the                            procedure, the patient's blood pressure, pulse, and                            oxygen saturations were monitored continuously. The                            Olympus PCF-H190DL ([id number]) Colonoscope was                            introduced through the  anus and advanced to the                            cecum, identified by appendiceal orifice and                            ileocecal valve. The colonoscopy was performed                            without difficulty. The patient tolerated the                            procedure well. The quality of the bowel                            preparation was good. The ileocecal valve,                            appendiceal orifice, and rectum were photographed. Scope In: 11:05:44 AM Scope Out: 11:29:48 AM Scope Withdrawal Time: 0 hours 18 minutes 40 seconds  Total Procedure Duration: 0 hours 24 minutes 4 seconds  Findings:                 The digital rectal exam was normal.                           A 4 mm polyp was found in the cecum. The polyp was                            sessile. The polyp was removed with a cold snare.                            Resection and retrieval were complete.                           A 7 mm polyp was found in the hepatic flexure. The                            polyp was sessile. The polyp  was removed with a                            cold snare. Resection and retrieval were complete.                           A 7 mm polyp was found in the transverse colon. The                            polyp was sessile. The polyp was removed with a                            cold snare. Resection and retrieval were complete.                           A 3 mm polyp was found in the sigmoid colon. The                            polyp was sessile. The polyp was removed with a                            cold snare. Resection and retrieval were complete.                           A few small-mouthed diverticula were found in the                            transverse colon and hepatic flexure.                           Internal hemorrhoids were found during                            retroflexion. The hemorrhoids were medium-sized. Complications:            No immediate  complications. Estimated Blood Loss:     Estimated blood loss was minimal. Impression:               - One 4 mm polyp in the cecum, removed with a cold                            snare. Resected and retrieved.                           - One 7 mm polyp at the hepatic flexure, removed                            with a cold snare. Resected and retrieved.                           - One 7 mm polyp in the transverse colon, removed  with a cold snare. Resected and retrieved.                           - One 3 mm polyp in the sigmoid colon, removed with                            a cold snare. Resected and retrieved.                           - Diverticulosis in the transverse colon and at the                            hepatic flexure.                           - Internal hemorrhoids. Recommendation:           - Patient has a contact number available for                            emergencies. The signs and symptoms of potential                            delayed complications were discussed with the                            patient. Return to normal activities tomorrow.                            Written discharge instructions were provided to the                            patient.                           - Resume previous diet.                           - Continue present medications.                           - Await pathology results.                           - No repeat colonoscopy due to age at next                            surveillance interval. Jerene Bears, MD 10/26/2020 11:33:38 AM This report has been signed electronically.

## 2020-10-30 ENCOUNTER — Telehealth: Payer: Self-pay

## 2020-10-30 NOTE — Telephone Encounter (Signed)
No answer on follow up call. No voicemail.

## 2020-10-30 NOTE — Telephone Encounter (Signed)
No answer

## 2020-10-31 ENCOUNTER — Encounter: Payer: Self-pay | Admitting: Internal Medicine

## 2020-11-23 ENCOUNTER — Encounter: Payer: Medicare Other | Admitting: Internal Medicine

## 2020-11-30 ENCOUNTER — Encounter (INDEPENDENT_AMBULATORY_CARE_PROVIDER_SITE_OTHER): Payer: Medicare Other | Admitting: Ophthalmology

## 2020-12-07 ENCOUNTER — Other Ambulatory Visit: Payer: Self-pay | Admitting: Internal Medicine

## 2020-12-12 ENCOUNTER — Other Ambulatory Visit (HOSPITAL_COMMUNITY): Payer: Self-pay | Admitting: Urology

## 2020-12-12 ENCOUNTER — Other Ambulatory Visit: Payer: Self-pay | Admitting: Urology

## 2020-12-12 DIAGNOSIS — N401 Enlarged prostate with lower urinary tract symptoms: Secondary | ICD-10-CM

## 2020-12-12 DIAGNOSIS — R972 Elevated prostate specific antigen [PSA]: Secondary | ICD-10-CM

## 2021-01-03 ENCOUNTER — Ambulatory Visit (INDEPENDENT_AMBULATORY_CARE_PROVIDER_SITE_OTHER): Payer: Medicare Other | Admitting: Ophthalmology

## 2021-01-03 ENCOUNTER — Encounter (INDEPENDENT_AMBULATORY_CARE_PROVIDER_SITE_OTHER): Payer: Self-pay | Admitting: Ophthalmology

## 2021-01-03 ENCOUNTER — Other Ambulatory Visit: Payer: Self-pay

## 2021-01-03 DIAGNOSIS — Z9989 Dependence on other enabling machines and devices: Secondary | ICD-10-CM

## 2021-01-03 DIAGNOSIS — H35071 Retinal telangiectasis, right eye: Secondary | ICD-10-CM

## 2021-01-03 DIAGNOSIS — G4733 Obstructive sleep apnea (adult) (pediatric): Secondary | ICD-10-CM

## 2021-01-03 DIAGNOSIS — D3131 Benign neoplasm of right choroid: Secondary | ICD-10-CM | POA: Diagnosis not present

## 2021-01-03 DIAGNOSIS — H348322 Tributary (branch) retinal vein occlusion, left eye, stable: Secondary | ICD-10-CM | POA: Diagnosis not present

## 2021-01-03 NOTE — Progress Notes (Signed)
01/03/2021     CHIEF COMPLAINT Patient presents for Retina Follow Up   HISTORY OF PRESENT ILLNESS: Corey Fischer is a 87 y.o. male who presents to the clinic today for:   HPI     Retina Follow Up           Diagnosis: Other (Retinal telangiectasia of right eye)   Laterality: both eyes   Onset: years ago   Duration: years         Comments   1 yr fu ou oct Pt has an allergy to ciprofloxacin. Pt states he notices a film over his eyes sometimes and glare, states he sees Dr. Herbert Deaner in December. Denies new FOL/ floaters.  Pt states he occasionally experiences ocular migraines, last time 3-4 months ago.        Last edited by Laurin Coder, COA on 01/03/2021  1:35 PM.      Referring physician: [name], MD [place],  [place] [address]  HISTORICAL INFORMATION:   Selected notes from the MEDICAL RECORD NUMBER    Lab Results  Component Value Date   HGBA1C 5.9 03/10/2018     CURRENT MEDICATIONS: No current outpatient medications on file. (Ophthalmic Drugs)   No current facility-administered medications for this visit. (Ophthalmic Drugs)   Current Outpatient Medications (Other)  Medication Sig   amLODipine (NORVASC) 2.5 MG tablet TAKE 1 TABLET AT BEDTIME   Ascorbic Acid (VITAMIN C) 1000 MG tablet Take 1,000 mg by mouth daily.   aspirin 81 MG tablet Take 81 mg by mouth daily.   atenolol (TENORMIN) 25 MG tablet TAKE 1/2 TABLET BY MOUTH ONCE DAILY   atorvastatin (LIPITOR) 20 MG tablet TAKE 1 TABLET BY MOUTH EVERY DAY   Cholecalciferol (VITAMIN D3) 2000 units capsule Take 2,000 Units by mouth daily.   Coenzyme Q10 50 MG CHEW Chew 200 mg by mouth daily. GUMMY   Cyanocobalamin (VITAMIN B-12) 1000 MCG SUBL Place under the tongue.    diazepam (VALIUM) 5 MG tablet Take 0.5-1 tablets (2.5-5 mg total) by mouth at bedtime as needed for anxiety (or sleep). (Patient not taking: Reported on 10/26/2020)   fexofenadine (ALLEGRA) 180 MG tablet Take 1 tablet by mouth  daily.   fluticasone (FLONASE) 50 MCG/ACT nasal spray Place 1 spray into both nostrils as needed.   Magnesium 200 MG TABS Take 200 mg by mouth daily.    pantoprazole (PROTONIX) 40 MG tablet TAKE 1 TABLET BY MOUTH EVERY DAY   polyethylene glycol (MIRALAX / GLYCOLAX) packet Take 17 g by mouth daily.    Zinc 25 MG TABS Take 25 mg by mouth daily.    Current Facility-Administered Medications (Other)  Medication Route   0.9 %  sodium chloride infusion Intravenous      REVIEW OF SYSTEMS:    ALLERGIES Allergies  Allergen Reactions   Ciprofloxacin Other (See Comments)    Unknown reaction    PAST MEDICAL HISTORY Past Medical History:  Diagnosis Date   Abdominal pain 03/19/2015   Allergy    Anxiety    Aortic atherosclerosis (HCC)    Arthritis    Asymptomatic bilateral carotid artery stenosis    a. Last duplex 11/2013 R bulb and ICA - 50-69%, R ECA >50%. L bulb and prox ICA: 0-49%. Repeat due 11/2014.   B12 deficiency    Bilateral carotid artery disease (HCC)    BPH (benign prostatic hyperplasia)    Chronic ITP (idiopathic thrombocytopenia) (HCC)    Depression  Diverticulosis    Elevated PSA    Gastric polyp    Gastritis    GERD (gastroesophageal reflux disease)    H/O hiatal hernia    Hemorrhoids    History of hiatal hernia    Hx of adenomatous colonic polyps 2010   Hyperlipidemia    Hypertension    INTERNAL HEMORRHOIDS WITHOUT MENTION COMP 08/14/2007   Qualifier: Diagnosis of  By: Arnoldo Morale MD, Balinda Quails    Nephrolithiasis    Osteoarthritis hips   PAC (premature atrial contraction)    a. NSR/SB with 1st degree AVB and PACs/atrial bigeminy seen on event monitor 11/2012.   Palpitations    Raynaud's syndrome    Schatzki's ring    Sinus bradycardia    Sleep apnea 2021   Stricture and stenosis of esophagus 2010   last endoscopy- 2010   Symptomatic PVCs    a. Per report, monitor 05/2012 - NSR with symptomatic PVC. b. Echo 05/2012- EF 55-60%, aortic sclerosis without stenosis.  c. Nuc 12/2012 - normal.   Tubular adenoma of colon    Wandering (atrial) pacemaker    Past Surgical History:  Procedure Laterality Date   APPENDECTOMY  age 72   [place] that he is "pristine "   CAROTID DUPLEX Bilateral 05-26-2007   CHOLECYSTECTOMY  1989   COLONOSCOPY WITH PROPOFOL N/A 03/22/2015   Procedure: COLONOSCOPY WITH PROPOFOL;  Surgeon: Jerene Bears, MD;  Location: [facility];  Service: Endoscopy;  Laterality: N/A;   ESOPHAGOGASTRODUODENOSCOPY (EGD) WITH PROPOFOL N/A 03/22/2015   Procedure: ESOPHAGOGASTRODUODENOSCOPY (EGD) WITH PROPOFOL;  Surgeon: Jerene Bears, MD;  Location: [facility];  Service: Endoscopy;  Laterality: N/A;   LAPAROSCOPIC INGUINAL HERNIA REPAIR Bilateral 09-19-2010   LEFT SHOULDER SURG.  2006   ROTATOR CUFF REPAIR Right 1987   TONSILLECTOMY  age 55   TOTAL HIP ARTHROPLASTY Left 05/31/2013   Procedure: TOTAL HIP ARTHROPLASTY;  Surgeon: Kerin Salen, MD;  Location: [facility];  Service: Orthopedics;  Laterality: Left;   TRANSURETHRAL RESECTION OF PROSTATE  07/22/2011   Procedure: TRANSURETHRAL RESECTION OF THE PROSTATE WITH GYRUS INSTRUMENTS;  Surgeon: Bernestine Amass, MD;  Location: [facility];  Service: Urology;  Laterality: N/A;  Saline Gyrus TURP     FAMILY HISTORY Family History  Problem Relation Age of Onset   Heart disease Mother    Colon polyps Mother    Heart disease Father    Angina Father    Cancer Maternal Grandmother    Heart failure Maternal Grandfather    Colon cancer Neg Hx     SOCIAL HISTORY Social History   Tobacco Use   Smoking status: Never   Smokeless tobacco: Never  Vaping Use   Vaping Use: Never used  Substance Use Topics   Alcohol use: Yes    Comment: occasionally   Drug use: No         OPHTHALMIC EXAM:  Base Eye Exam     Visual Acuity (ETDRS)       Right Left   Dist [place] 20/25 +1 20/30 -1   Dist ph [place]  NI         Tonometry (Tonopen, 1:28 PM)       Right Left    Pressure 20 19         Pupils       Pupils Dark Light Shape React APD   Right PERRL 4 3 Round Brisk None   Left PERRL 4 3 Round  Brisk None         Visual Fields (Counting fingers)       Left Right    Full Full         Extraocular Movement       Right Left    Full Full         Neuro/Psych     Oriented x3: Yes   Mood/Affect: Normal         Dilation     Both eyes: 1.0% Mydriacyl, 2.5% Phenylephrine @ 1:28 PM           Slit Lamp and Fundus Exam     External Exam       Right Left   External Normal Normal         Slit Lamp Exam       Right Left   Lids/Lashes Normal Normal   Conjunctiva/Sclera White and quiet White and quiet   Cornea Clear Clear   Anterior Chamber Deep and quiet Deep and quiet   Iris Round and reactive Round and reactive   Lens Posterior chamber intraocular lens Posterior chamber intraocular lens   Anterior Vitreous Normal Normal         Fundus Exam       Right Left   Posterior Vitreous Posterior vitreous detachment Posterior vitreous detachment   Disc Normal Collaterals on the nerve   C/D Ratio 0.25 0.25   Macula Normal Microaneurysms, no exudates, no macular thickening   Vessels Normal Old inferior retinal vein occlusion, compensated stable   Periphery ,, Reticular degeneration Normal            IMAGING AND PROCEDURES  Imaging and Procedures for 01/03/21  OCT, Retina - OU - Both Eyes       Right Eye Quality was good. Scan locations included subfoveal. Central Foveal Thickness: 278. Progression has been stable. Findings include normal foveal contour.   Left Eye Quality was good. Scan locations included subfoveal. Central Foveal Thickness: 242. Progression has been stable. Findings include normal foveal contour, retinal drusen .   Notes OS with posterior vitreous detachment, also with areas of atrophy of the macula diffusely in the region of prior retinal vein occlusion, no active CME              ASSESSMENT/PLAN:  Branch retinal vein occlusion of left eye Compensated condition left eye, no active CME continue to observe, collateralized vessel on the optic nerve   Choroidal nevus of right eye Stable no change  Retinal telangiectasia of right eye No signs of progression with no obvious CME watch nasally at the [facility], as patient currently having some difficulties with the mask, minor CME is notable on the OCT nasal to the FAZ OD in an area of microaneurysms from MAC-TEL  OSA on CPAP Patient reports excellent compliance use of CPAP for diagnosis of sleep apnea at some 2 to 3 years previous with 24 AHI events per hour at that time of testing.  Patient also notes coincident that fewer PACs and PVCs now that patient has successful use of sleep apnea treatment nightly  Patient has trouble with mask currently with mask creating a nasal abrasion     ICD-10-CM   1. Retinal telangiectasia of right eye  H35.071 OCT, Retina - OU - Both Eyes    2. Stable branch retinal vein occlusion of left eye  H34.8322     3. Choroidal nevus of right eye  D31.31     4. OSA on CPAP  G47.33    Z99.89       1.  Macular telangiectasis right eye overall stable with only a minor recurrence of leak  2.  OD, very minor recurrence of CME which is no risk to vision as patient struggles with mask issue on CPAP.  3.  Stable cyst BRVO OS  Ophthalmic Meds Ordered this visit:  No orders of the defined types were placed in this encounter.      Return in about 1 year (around 01/03/2022) for DILATE OU, OCT.  There are no Patient Instructions on file for this visit.   Explained the diagnoses, plan, and follow up with the patient and they expressed understanding.  Patient expressed understanding of the importance of proper follow up care.   Clent Demark Savon Bordonaro M.D. Diseases & Surgery of the Retina and Vitreous [facility] [place] 01/03/21     Abbreviations: M myopia (nearsighted); A astigmatism;  H hyperopia (farsighted); P presbyopia; Mrx spectacle prescription;  CTL contact lenses; OD right eye; OS left eye; OU both eyes  XT exotropia; ET esotropia; PEK punctate epithelial keratitis; PEE punctate epithelial erosions; DES dry eye syndrome; MGD meibomian gland dysfunction; ATs artificial tears; PFAT's preservative free artificial tears; Sells nuclear sclerotic cataract; PSC posterior subcapsular cataract; ERM epi-retinal membrane; PVD posterior vitreous detachment; RD retinal detachment; DM diabetes mellitus; DR diabetic retinopathy; NPDR non-proliferative diabetic retinopathy; PDR proliferative diabetic retinopathy; CSME clinically significant macular edema; DME diabetic macular edema; dbh dot blot hemorrhages; CWS cotton wool spot; POAG primary open angle glaucoma; C/D cup-to-disc ratio; HVF humphrey visual field; GVF goldmann visual field; OCT optical coherence tomography; IOP intraocular pressure; BRVO Branch retinal vein occlusion; CRVO central retinal vein occlusion; CRAO central retinal artery occlusion; BRAO branch retinal artery occlusion; RT retinal tear; SB scleral buckle; PPV pars plana vitrectomy; VH Vitreous hemorrhage; PRP panretinal laser photocoagulation; IVK intravitreal kenalog; VMT vitreomacular traction; MH Macular hole;  NVD neovascularization of the disc; NVE neovascularization elsewhere; AREDS age related eye disease study; ARMD age related macular degeneration; POAG primary open angle glaucoma; EBMD epithelial/anterior basement membrane dystrophy; ACIOL anterior chamber intraocular lens; IOL intraocular lens; PCIOL posterior chamber intraocular lens; Phaco/IOL phacoemulsification with intraocular lens placement; Coles photorefractive keratectomy; LASIK laser assisted in situ keratomileusis; HTN hypertension; DM diabetes mellitus; COPD chronic obstructive pulmonary disease

## 2021-01-03 NOTE — Assessment & Plan Note (Signed)
Patient reports excellent compliance use of CPAP for diagnosis of sleep apnea at some 2 to 3 years previous with 24 AHI events per hour at that time of testing.  Patient also notes coincident that fewer PACs and PVCs now that patient has successful use of sleep apnea treatment nightly  Patient has trouble with mask currently with mask creating a nasal abrasion

## 2021-01-03 NOTE — Assessment & Plan Note (Addendum)
No signs of progression with no obvious CME watch nasally at the [facility], as patient currently having some difficulties with the mask, minor CME is notable on the OCT nasal to the FAZ OD in an area of microaneurysms from MAC-TEL

## 2021-01-03 NOTE — Assessment & Plan Note (Signed)
Compensated condition left eye, no active CME continue to observe, collateralized vessel on the optic nerve

## 2021-01-03 NOTE — Assessment & Plan Note (Signed)
Stable no change

## 2021-01-09 ENCOUNTER — Ambulatory Visit (HOSPITAL_COMMUNITY)
Admission: RE | Admit: 2021-01-09 | Discharge: 2021-01-09 | Disposition: A | Discharge status: Home / Self Care | Payer: Medicare Other | Source: Ambulatory Visit | Attending: Cardiovascular Disease | Admitting: Cardiovascular Disease

## 2021-01-09 ENCOUNTER — Other Ambulatory Visit: Payer: Self-pay

## 2021-01-09 ENCOUNTER — Other Ambulatory Visit (HOSPITAL_COMMUNITY): Payer: Self-pay | Admitting: Cardiovascular Disease

## 2021-01-09 DIAGNOSIS — I6523 Occlusion and stenosis of bilateral carotid arteries: Secondary | ICD-10-CM | POA: Diagnosis present

## 2021-01-11 ENCOUNTER — Other Ambulatory Visit: Payer: Self-pay

## 2021-01-11 ENCOUNTER — Encounter (HOSPITAL_BASED_OUTPATIENT_CLINIC_OR_DEPARTMENT_OTHER): Payer: Self-pay | Admitting: Urology

## 2021-01-11 NOTE — Progress Notes (Addendum)
Spoke w/ via phone for pre-op interview--- Pt Lab needs dos----  Istat             Lab results------ current ekg in epic/ chart COVID test -----patient states asymptomatic;  covid test 08/ 26 for overnight  Arrive at ------- 0530 on 01-16-2021 NPO after MN NO Solid Food.  Clear liquids from MN until--- 0430 Med rec completed Medications to take morning of surgery ----- Atenolol, Allegra, Protonix Diabetic medication ----- n/a Patient instructed no nail polish to be worn day of surgery Patient instructed to bring photo id and insurance card day of surgery  Patient aware to have Driver (ride ) / caregiver  for 24 hours after surgery -- wife, Marlowe Kays Patient Special Instructions ----- reviewed RCC and visitor guidelines.  Will do one fleet enema morning of surgery. Pt asked to bring his cpap/ mask/ tubing with him dos Pre-Op special Istructions ----- n/a  Patient verbalized understanding of instructions that were given at this phone interview. Patient denies shortness of breath, chest pain, fever, cough at this phone interview.    Anesthesia Review:  HTN:  symptomatic PACs/PVCs with palpitations;  OSA w/ cpap, uses nightly per pt;  bilateral carotid stenosis, asymptomatic;  Chronic ITP;   Pre-diabetes;  pt denies chest pain, sob, and no peripheral swelling.  PCP:  Dr Jerilynn Mages. Antony Salmon (lov 10-03-2020 epic) Cardiologist : Dr Gwenlyn Found (lov 02-02-2020 epic) Chest x-ray : chest CT05-29-2019 epic EKG : 02-02-2020 epic Echo : 06-17-2012 epic Stress test: 08-01-20104 epic Cardiac Cath :  per pt had cath 1989 was told no cad  Activity level: denies sob w/ any activity Sleep Study/ CPAP : Yes/ Yes Blood Thinner/ Instructions Maryjane Hurter Dose: no ASA / Instructions/ Last Dose :  ASA '81mg'$ ;  per pt given instructions by dr eskridge office to stop prior to surgery,  last dose 01-11-2021.

## 2021-01-12 ENCOUNTER — Other Ambulatory Visit: Payer: Self-pay | Admitting: Urology

## 2021-01-12 ENCOUNTER — Other Ambulatory Visit (HOSPITAL_COMMUNITY): Payer: Medicare Other

## 2021-01-13 LAB — SARS CORONAVIRUS 2 (TAT 6-24 HRS): SARS Coronavirus 2: NEGATIVE

## 2021-01-16 ENCOUNTER — Ambulatory Visit (HOSPITAL_BASED_OUTPATIENT_CLINIC_OR_DEPARTMENT_OTHER): Payer: Medicare Other | Admitting: Anesthesiology

## 2021-01-16 ENCOUNTER — Ambulatory Visit (HOSPITAL_BASED_OUTPATIENT_CLINIC_OR_DEPARTMENT_OTHER)
Admission: RE | Admit: 2021-01-16 | Discharge: 2021-01-16 | Disposition: A | Discharge status: Home / Self Care | Payer: Medicare Other | Attending: Urology | Admitting: Urology

## 2021-01-16 ENCOUNTER — Other Ambulatory Visit: Payer: Self-pay

## 2021-01-16 ENCOUNTER — Encounter (HOSPITAL_BASED_OUTPATIENT_CLINIC_OR_DEPARTMENT_OTHER): Payer: Self-pay | Admitting: Urology

## 2021-01-16 ENCOUNTER — Ambulatory Visit (HOSPITAL_COMMUNITY)
Admission: RE | Admit: 2021-01-16 | Discharge: 2021-01-16 | Disposition: A | Discharge status: Home / Self Care | Payer: Medicare Other | Source: Ambulatory Visit | Attending: Urology | Admitting: Urology

## 2021-01-16 ENCOUNTER — Encounter (HOSPITAL_BASED_OUTPATIENT_CLINIC_OR_DEPARTMENT_OTHER)
Admission: RE | Disposition: A | Discharge status: Home / Self Care | Payer: Self-pay | Source: Home / Self Care | Attending: Urology

## 2021-01-16 DIAGNOSIS — Z86010 Personal history of colonic polyps: Secondary | ICD-10-CM | POA: Diagnosis not present

## 2021-01-16 DIAGNOSIS — Z8249 Family history of ischemic heart disease and other diseases of the circulatory system: Secondary | ICD-10-CM | POA: Insufficient documentation

## 2021-01-16 DIAGNOSIS — I10 Essential (primary) hypertension: Secondary | ICD-10-CM | POA: Diagnosis not present

## 2021-01-16 DIAGNOSIS — Z7982 Long term (current) use of aspirin: Secondary | ICD-10-CM | POA: Diagnosis not present

## 2021-01-16 DIAGNOSIS — R3912 Poor urinary stream: Secondary | ICD-10-CM | POA: Insufficient documentation

## 2021-01-16 DIAGNOSIS — Z20822 Contact with and (suspected) exposure to covid-19: Secondary | ICD-10-CM | POA: Insufficient documentation

## 2021-01-16 DIAGNOSIS — N401 Enlarged prostate with lower urinary tract symptoms: Secondary | ICD-10-CM | POA: Insufficient documentation

## 2021-01-16 DIAGNOSIS — Z881 Allergy status to other antibiotic agents status: Secondary | ICD-10-CM | POA: Insufficient documentation

## 2021-01-16 DIAGNOSIS — Z96642 Presence of left artificial hip joint: Secondary | ICD-10-CM | POA: Insufficient documentation

## 2021-01-16 DIAGNOSIS — R972 Elevated prostate specific antigen [PSA]: Secondary | ICD-10-CM

## 2021-01-16 DIAGNOSIS — Z8371 Family history of colonic polyps: Secondary | ICD-10-CM | POA: Insufficient documentation

## 2021-01-16 DIAGNOSIS — E785 Hyperlipidemia, unspecified: Secondary | ICD-10-CM | POA: Diagnosis not present

## 2021-01-16 DIAGNOSIS — G4733 Obstructive sleep apnea (adult) (pediatric): Secondary | ICD-10-CM | POA: Diagnosis not present

## 2021-01-16 DIAGNOSIS — R350 Frequency of micturition: Secondary | ICD-10-CM | POA: Insufficient documentation

## 2021-01-16 DIAGNOSIS — Z9049 Acquired absence of other specified parts of digestive tract: Secondary | ICD-10-CM | POA: Insufficient documentation

## 2021-01-16 DIAGNOSIS — Z8719 Personal history of other diseases of the digestive system: Secondary | ICD-10-CM | POA: Diagnosis not present

## 2021-01-16 DIAGNOSIS — Z950 Presence of cardiac pacemaker: Secondary | ICD-10-CM | POA: Diagnosis not present

## 2021-01-16 DIAGNOSIS — D693 Immune thrombocytopenic purpura: Secondary | ICD-10-CM | POA: Diagnosis not present

## 2021-01-16 HISTORY — DX: Calculus of kidney: N20.0

## 2021-01-16 HISTORY — PX: TRANSURETHRAL RESECTION OF PROSTATE: SHX73

## 2021-01-16 HISTORY — DX: Diaphragmatic hernia without obstruction or gangrene: K44.9

## 2021-01-16 HISTORY — DX: Obstructive sleep apnea (adult) (pediatric): G47.33

## 2021-01-16 HISTORY — DX: Benign prostatic hyperplasia with lower urinary tract symptoms: R35.1

## 2021-01-16 HISTORY — DX: Diverticulosis of large intestine without perforation or abscess without bleeding: K57.30

## 2021-01-16 HISTORY — DX: Personal history of other diseases of the digestive system: Z87.19

## 2021-01-16 HISTORY — DX: Benign prostatic hyperplasia with lower urinary tract symptoms: N40.1

## 2021-01-16 HISTORY — PX: PROSTATE BIOPSY: SHX241

## 2021-01-16 HISTORY — DX: Presence of external hearing-aid: Z97.4

## 2021-01-16 HISTORY — DX: Tributary (branch) retinal vein occlusion, left eye, stable: H34.8322

## 2021-01-16 HISTORY — DX: Other constipation: K59.09

## 2021-01-16 LAB — POCT I-STAT, CHEM 8
BUN: 14 mg/dL (ref 8–23)
Calcium, Ion: 1.26 mmol/L (ref 1.15–1.40)
Chloride: 103 mmol/L (ref 98–111)
Creatinine, Ser: 0.9 mg/dL (ref 0.61–1.24)
Glucose, Bld: 109 mg/dL — ABNORMAL HIGH (ref 70–99)
HCT: 43 % (ref 39.0–52.0)
Hemoglobin: 14.6 g/dL (ref 13.0–17.0)
Potassium: 4 mmol/L (ref 3.5–5.1)
Sodium: 140 mmol/L (ref 135–145)
TCO2: 24 mmol/L (ref 22–32)

## 2021-01-16 LAB — RESP PANEL BY RT-PCR (FLU A&B, COVID) ARPGX2
Influenza A by PCR: NEGATIVE
Influenza B by PCR: NEGATIVE
SARS Coronavirus 2 by RT PCR: NEGATIVE

## 2021-01-16 SURGERY — TURP (TRANSURETHRAL RESECTION OF PROSTATE)
Anesthesia: General | Site: Prostate

## 2021-01-16 MED ORDER — ONDANSETRON HCL 4 MG/2ML IJ SOLN: 4.0000 mg | Freq: Once | INTRAMUSCULAR | Status: DC | PRN

## 2021-01-16 MED ORDER — EPHEDRINE 5 MG/ML INJ
INTRAVENOUS | Status: AC
  Filled 2021-01-16: qty 5

## 2021-01-16 MED ORDER — SODIUM CHLORIDE 0.9 % IR SOLN
Status: DC | PRN
  Administered 2021-01-16: 3000 mL via INTRAVESICAL
  Administered 2021-01-16 (×2): 6000 mL via INTRAVESICAL

## 2021-01-16 MED ORDER — LIDOCAINE HCL (PF) 2 % IJ SOLN
INTRAMUSCULAR | Status: AC
  Filled 2021-01-16: qty 5

## 2021-01-16 MED ORDER — LACTATED RINGERS IV SOLN: INTRAVENOUS | Status: DC

## 2021-01-16 MED ORDER — PROPOFOL 10 MG/ML IV BOLUS
INTRAVENOUS | Status: DC | PRN
  Administered 2021-01-16: 130 mg via INTRAVENOUS

## 2021-01-16 MED ORDER — IBUPROFEN 200 MG PO TABS
400.0000 mg | ORAL_TABLET | Freq: Four times a day (QID) | ORAL | Status: DC | PRN
Start: 2021-01-16 — End: 2021-01-16
  Administered 2021-01-16: 400 mg via ORAL

## 2021-01-16 MED ORDER — OXYCODONE HCL 5 MG/5ML PO SOLN: 5.0000 mg | Freq: Once | ORAL | Status: DC | PRN

## 2021-01-16 MED ORDER — EPHEDRINE SULFATE 50 MG/ML IJ SOLN
INTRAMUSCULAR | Status: DC | PRN
  Administered 2021-01-16: 10 mg via INTRAVENOUS

## 2021-01-16 MED ORDER — ONDANSETRON HCL 4 MG/2ML IJ SOLN
INTRAMUSCULAR | Status: AC
  Filled 2021-01-16: qty 2

## 2021-01-16 MED ORDER — LIDOCAINE HCL (CARDIAC) PF 100 MG/5ML IV SOSY
PREFILLED_SYRINGE | INTRAVENOUS | Status: DC | PRN
  Administered 2021-01-16: 100 mg via INTRAVENOUS

## 2021-01-16 MED ORDER — AMISULPRIDE (ANTIEMETIC) 5 MG/2ML IV SOLN: 10.0000 mg | Freq: Once | INTRAVENOUS | Status: DC | PRN

## 2021-01-16 MED ORDER — PROPOFOL 10 MG/ML IV BOLUS
INTRAVENOUS | Status: AC
  Filled 2021-01-16: qty 20

## 2021-01-16 MED ORDER — CEFAZOLIN SODIUM-DEXTROSE 2-4 GM/100ML-% IV SOLN
INTRAVENOUS | Status: AC
  Filled 2021-01-16: qty 100

## 2021-01-16 MED ORDER — IBUPROFEN 200 MG PO TABS
ORAL_TABLET | ORAL | Status: AC
  Filled 2021-01-16: qty 2

## 2021-01-16 MED ORDER — CEFAZOLIN SODIUM-DEXTROSE 2-4 GM/100ML-% IV SOLN
2.0000 g | INTRAVENOUS | Status: AC
  Administered 2021-01-16: 2 g via INTRAVENOUS

## 2021-01-16 MED ORDER — ONDANSETRON HCL 4 MG/2ML IJ SOLN
INTRAMUSCULAR | Status: DC | PRN
  Administered 2021-01-16: 4 mg via INTRAVENOUS

## 2021-01-16 MED ORDER — FENTANYL CITRATE (PF) 100 MCG/2ML IJ SOLN
INTRAMUSCULAR | Status: AC
  Filled 2021-01-16: qty 2

## 2021-01-16 MED ORDER — FENTANYL CITRATE (PF) 100 MCG/2ML IJ SOLN
INTRAMUSCULAR | Status: DC | PRN
  Administered 2021-01-16 (×2): 25 ug via INTRAVENOUS
  Administered 2021-01-16: 50 ug via INTRAVENOUS

## 2021-01-16 MED ORDER — OXYCODONE HCL 5 MG PO TABS: 5.0000 mg | ORAL_TABLET | Freq: Once | ORAL | Status: DC | PRN

## 2021-01-16 MED ORDER — NITROFURANTOIN MONOHYD MACRO 100 MG PO CAPS: 100.0000 mg | ORAL_CAPSULE | Freq: Two times a day (BID) | ORAL | 0 refills | Status: DC

## 2021-01-16 MED ORDER — FENTANYL CITRATE (PF) 100 MCG/2ML IJ SOLN: 25.0000 ug | INTRAMUSCULAR | Status: DC | PRN

## 2021-01-16 SURGICAL SUPPLY — 41 items
BAG DRAIN URO-CYSTO SKYTR STRL (DRAIN) ×2 IMPLANT
BAG DRN RND TRDRP ANRFLXCHMBR (UROLOGICAL SUPPLIES) ×1
BAG DRN UROCATH (DRAIN) ×1
BAG URINE DRAIN 2000ML AR STRL (UROLOGICAL SUPPLIES) ×2 IMPLANT
BAG URINE LEG 500ML (DRAIN) IMPLANT
CATH FOLEY 2WAY SLVR 30CC 20FR (CATHETERS) ×1 IMPLANT
CATH FOLEY 3WAY 30CC 22FR (CATHETERS) ×2 IMPLANT
CATH HEMA 3WAY 30CC 24FR COUDE (CATHETERS) IMPLANT
CATH HEMA 3WAY 30CC 24FR RND (CATHETERS) IMPLANT
CLOTH BEACON ORANGE TIMEOUT ST (SAFETY) ×1 IMPLANT
DRSG TELFA 3X8 NADH (GAUZE/BANDAGES/DRESSINGS) IMPLANT
ELECT REM PT RETURN 9FT ADLT (ELECTROSURGICAL) ×2
ELECTRODE REM PT RTRN 9FT ADLT (ELECTROSURGICAL) ×1 IMPLANT
EVACUATOR MICROVAS BLADDER (UROLOGICAL SUPPLIES) IMPLANT
GLOVE SURG ENC MOIS LTX SZ7.5 (GLOVE) ×2 IMPLANT
GLOVE SURG ENC MOIS LTX SZ8 (GLOVE) IMPLANT
GOWN STRL REUS W/TWL LRG LVL3 (GOWN DISPOSABLE) ×2 IMPLANT
HOLDER FOLEY CATH W/STRAP (MISCELLANEOUS) ×1 IMPLANT
INST BIOPSY MAXCORE 18GX25 (NEEDLE) ×2 IMPLANT
INSTR BIOPSY MAXCORE 18GX20 (NEEDLE) IMPLANT
IV NS IRRIG 3000ML ARTHROMATIC (IV SOLUTION) ×6 IMPLANT
KIT TURNOVER CYSTO (KITS) ×2 IMPLANT
LOOP CUT BIPOLAR 24F LRG (ELECTROSURGICAL) ×2 IMPLANT
MANIFOLD NEPTUNE II (INSTRUMENTS) ×2 IMPLANT
NDL SAFETY ECLIPSE 18X1.5 (NEEDLE) IMPLANT
NDL SPNL 22GX7 QUINCKE BK (NEEDLE) IMPLANT
NEEDLE HYPO 18GX1.5 SHARP (NEEDLE)
NEEDLE SPNL 22GX7 QUINCKE BK (NEEDLE) IMPLANT
PACK CYSTO (CUSTOM PROCEDURE TRAY) ×2 IMPLANT
PAD DRESSING TELFA 3X8 NADH (GAUZE/BANDAGES/DRESSINGS) IMPLANT
PLUG CATH AND CAP STER (CATHETERS) IMPLANT
SURGILUBE 2OZ TUBE FLIPTOP (MISCELLANEOUS) IMPLANT
SYR 30ML LL (SYRINGE) ×2 IMPLANT
SYR CONTROL 10ML LL (SYRINGE) IMPLANT
SYR TOOMEY IRRIG 70ML (MISCELLANEOUS) ×2
SYRINGE TOOMEY IRRIG 70ML (MISCELLANEOUS) IMPLANT
TOWEL OR 17X26 10 PK STRL BLUE (TOWEL DISPOSABLE) IMPLANT
TUBE CONNECTING 12X1/4 (SUCTIONS) ×2 IMPLANT
TUBING UROLOGY SET (TUBING) ×2 IMPLANT
UNDERPAD 30X36 HEAVY ABSORB (UNDERPADS AND DIAPERS) ×2 IMPLANT
WATER STERILE IRR 500ML POUR (IV SOLUTION) ×1 IMPLANT

## 2021-01-16 NOTE — Transfer of Care (Signed)
Immediate Anesthesia Transfer of Care Note  Patient: Corey Fischer  Procedure(s) Performed: TRANSURETHRAL RESECTION OF THE PROSTATE (TURP) (Prostate) BIOPSY TRANSRECTAL ULTRASONIC PROSTATE (TUBP) (Prostate)  Patient Location: PACU  Anesthesia Type:General  Level of Consciousness: awake, alert  and oriented  Airway & Oxygen Therapy: Patient Spontanous Breathing and Patient connected to nasal cannula oxygen  Post-op Assessment: Report given to RN and Post -op Vital signs reviewed and stable  Post vital signs: Reviewed and stable  Last Vitals:  Vitals Value Taken Time  BP 148/55 01/16/21 0901  Temp    Pulse 66 01/16/21 0903  Resp 13 01/16/21 0903  SpO2 100 % 01/16/21 0903  Vitals shown include unvalidated device data.  Last Pain:  Vitals:   01/16/21 0628  TempSrc: Oral  PainSc: 0-No pain      Patients Stated Pain Goal: 3 ([id number])  Complications: No notable events documented.

## 2021-01-16 NOTE — H&P (Signed)
H&P  Chief Complaint: BPH, lower urinary tract symptoms, PSA elevation  History of Present Illness: Corey Fischer is an 87 year old male with a history of BPH and lower urinary tract symptoms.  He had a TURP in 2013.  Recently has had more of a weak stream frequency and urgency although all the symptoms can be intermittent.  He took finasteride in the past and does not wish to take meds again.  His prostate was 75 g on CT scan in 2018.  Recent office cystoscopy revealed prostatic regrowth and visual obstruction.   He also has a history of PSA elevation.  His PSA was 4.69 many years ago and he had a biopsy which was benign.  At that time his prostate was 3030 g.  His PSA was down to 1 on 5 alpha reductase inhibitors but patient has been off meds for many years.  His PSA was 2.85 in 2017.  Prostate was 75 g on CT in 2018.  PSA rose to 5.68 in 2018.  PSA now 7.19 Sep 2020.  He presents for transurethral resection of the prostate and transrectal ultrasound and prostate biopsy.  He has been well without fevers, dysuria or gross hematuria.  Past Medical History:  Diagnosis Date   Anxiety    Aortic atherosclerosis ([place])    Asymptomatic bilateral carotid artery stenosis    followed by cardiology-- Last duplex in epic 01-09-2021 bilateral  ICA 1-39%,  right  ECA >50%.   B12 deficiency    Benign prostatic hyperplasia with nocturia    urologist-- dr Hebert Soho retinal vein occlusion of left eye    followed by dr Zadie Rhine   Chronic constipation    Chronic ITP (idiopathic thrombocytopenia) ([place])    followed by pcp   Depression    Diverticulosis of colon    Elevated PSA    GERD (gastroesophageal reflux disease)    Hemorrhoids    Hiatal hernia    History of chronic gastritis    Hx of adenomatous colonic polyps    followed by dr Hilarie Fredrickson   Hyperlipidemia    Hypertension    followed by pcp   OSA on CPAP    followed by dr Jerilynn Mages. Camillo Flaming (pulm @ [facility] in [place])   Osteoarthritis    PAC  (premature atrial contraction)    followed by cardiology, dr berry,  pt symptomatic with palpitations and has PVCs   Raynaud's syndrome    Right nephrolithiasis    per pt told nonobstructive   Schatzki's ring    s/p  balloon dilatation by dr Raquel James 11/ 2016   Sinus bradycardia    Symptomatic PVCs    a. Per report, monitor 05/2012 - NSR with symptomatic PVC. b. Echo 05/2012- EF 55-60%, aortic sclerosis without stenosis. c. Nuc 12/2012 - normal.   Wandering (atrial) pacemaker    Wears hearing aid in both ears    Past Surgical History:  Procedure Laterality Date   [place] that he is "pristine "   CATARACT EXTRACTION W/ INTRAOCULAR LENS IMPLANT Bilateral 2015   CHOLECYSTECTOMY  1989   COLONOSCOPY     last one 10-26-2020 by dr pyrtle   COLONOSCOPY WITH PROPOFOL N/A 03/22/2015   Procedure: COLONOSCOPY WITH PROPOFOL;  Surgeon: Jerene Bears, MD;  Location: [facility];  Service: Endoscopy;  Laterality: N/A;   ESOPHAGOGASTRODUODENOSCOPY (EGD) WITH PROPOFOL N/A 03/22/2015   Procedure: ESOPHAGOGASTRODUODENOSCOPY (EGD) WITH PROPOFOL;  Surgeon: Lajuan Lines  Pyrtle, MD;  Location: [facility];  Service: Endoscopy;  Laterality: N/A;   LAPAROSCOPIC INGUINAL HERNIA REPAIR Bilateral 09/19/2010   LEFT SHOULDER SURG.  2006   ROTATOR CUFF REPAIR Right 1987   TONSILLECTOMY  1951   TOTAL HIP ARTHROPLASTY Left 05/31/2013   Procedure: TOTAL HIP ARTHROPLASTY;  Surgeon: Kerin Salen, MD;  Location: [facility];  Service: Orthopedics;  Laterality: Left;   TRANSURETHRAL RESECTION OF PROSTATE  07/22/2011   Procedure: TRANSURETHRAL RESECTION OF THE PROSTATE WITH GYRUS INSTRUMENTS;  Surgeon: Bernestine Amass, MD;  Location: [facility];  Service: Urology;  Laterality: N/A;  [place]ine Gyrus TURP     Home Medications:  Medications Prior to Admission  Medication Sig Dispense Refill Last Dose   amLODipine (NORVASC) 2.5 MG tablet TAKE 1 TABLET AT BEDTIME (Patient  taking differently: Take 2.5 mg by mouth at bedtime.) 90 tablet 3 01/15/2021   Ascorbic Acid (VITAMIN C) 1000 MG tablet Take 1,000 mg by mouth daily.      aspirin 81 MG tablet Take 81 mg by mouth daily.   Past Week   atenolol (TENORMIN) 25 MG tablet TAKE 1/2 TABLET BY MOUTH ONCE DAILY (Patient taking differently: Take 12.5 mg by mouth daily.) 45 tablet 3 01/15/2021 at 0800   atorvastatin (LIPITOR) 20 MG tablet TAKE 1 TABLET BY MOUTH EVERY DAY (Patient taking differently: Take 20 mg by mouth at bedtime.) 90 tablet 3 01/15/2021   Cholecalciferol (VITAMIN D3) 2000 units capsule Take 2,000 Units by mouth daily.      Cyanocobalamin (VITAMIN B-12) 1000 MCG SUBL Place under the tongue daily.   01/15/2021   fexofenadine (ALLEGRA) 180 MG tablet Take 1 tablet by mouth daily.   01/15/2021   fluticasone (FLONASE) 50 MCG/ACT nasal spray Place 1 spray into both nostrils as needed.   01/15/2021   Magnesium 200 MG TABS Take 200 mg by mouth at bedtime.   01/15/2021   pantoprazole (PROTONIX) 40 MG tablet TAKE 1 TABLET BY MOUTH EVERY DAY (Patient taking differently: Take 40 mg by mouth daily.) 90 tablet 2    polyethylene glycol (MIRALAX / GLYCOLAX) packet Take 17 g by mouth daily.       Zinc 25 MG TABS Take 25 mg by mouth daily.    01/15/2021   diazepam (VALIUM) 5 MG tablet Take 2.5 mg by mouth at bedtime as needed for anxiety.   More than a month   Allergies:  Allergies  Allergen Reactions   Ciprofloxacin     Unknown reaction    Family History  Problem Relation Age of Onset   Heart disease Mother    Colon polyps Mother    Heart disease Father    Angina Father    Cancer Maternal Grandmother    Heart failure Maternal Grandfather    Colon cancer Neg Hx    Social History:  reports that he has never smoked. He has never used smokeless tobacco. He reports current alcohol use. He reports that he does not use drugs.  ROS: A complete review of systems was performed.  All systems are negative except for pertinent  findings as noted. Review of Systems  All other systems reviewed and are negative.   Physical Exam:  Vital signs in last 24 hours: Temp:  [98.8 F (37.1 C)] 98.8 F (37.1 C) (08/30 0628) Pulse Rate:  [61] 61 (08/30 0628) Resp:  [15] 15 (08/30 0628) BP: (143)/(58) 143/58 (08/30 0628) SpO2:  [97 %] 97 % (08/30 0628) Weight:  [68.9 kg]  68.9 kg (08/30 [id number]) General:  Alert and oriented, No acute distress HEENT: Normocephalic, atraumatic Cardiovascular: Regular rate and rhythm Lungs: Regular rate and effort Abdomen: Soft, nontender, nondistended, no abdominal masses Back: No CVA tenderness Extremities: No edema Neurologic: Grossly intact  Laboratory Data:  Results for orders placed or performed during the hospital encounter of 01/16/21 (from the past 24 hour(s))  I-STAT, chem 8     Status: Abnormal   Collection Time: 01/16/21  6:19 AM  Result Value Ref Range   Sodium 140 135 - 145 mmol/L   Potassium 4.0 3.5 - 5.1 mmol/L   Chloride 103 98 - 111 mmol/L   BUN 14 8 - 23 mg/dL   Creatinine, Ser 0.90 0.61 - 1.24 mg/dL   Glucose, Bld 109 (H) 70 - 99 mg/dL   Calcium, Ion 1.26 1.15 - 1.40 mmol/L   TCO2 24 22 - 32 mmol/L   Hemoglobin 14.6 13.0 - 17.0 g/dL   HCT 43.0 39.0 - 52.0 %  Resp Panel by RT-PCR (Flu A&B, Covid) Nasopharyngeal Swab     Status: None   Collection Time: 01/16/21  6:33 AM   Specimen: Nasopharyngeal Swab; Nasopharyngeal(NP) swabs in vial transport medium  Result Value Ref Range   SARS Coronavirus 2 by RT PCR NEGATIVE NEGATIVE   Influenza A by PCR NEGATIVE NEGATIVE   Influenza B by PCR NEGATIVE NEGATIVE   Recent Results (from the past 240 hour(s))  SARS Coronavirus 2 (TAT 6-24 hrs)     Status: None   Collection Time: 01/12/21 12:00 AM  Result Value Ref Range Status   SARS Coronavirus 2 RESULT: NEGATIVE  Final    Comment: RESULT: NEGATIVESARS-CoV-2 INTERPRETATION:A NEGATIVE  test result means that SARS-CoV-2 RNA was not present in the specimen above the limit of  detection of this test. This does not preclude a possible SARS-CoV-2 infection and should not be used as the  sole basis for patient management decisions. Negative results must be combined with clinical observations, patient history, and epidemiological information. Optimum specimen types and timing for peak viral levels during infections caused by SARS-CoV-2  have not been determined. Collection of multiple specimens or types of specimens may be necessary to detect virus. Improper specimen collection and handling, sequence variability under primers/probes, or organism present below the limit of detection may  lead to false negative results. Positive and negative predictive values of testing are highly dependent on prevalence. False negative test results are more likely when prevalence of disease is high.The expected result is NEGATIVE.Fact S heet for  Healthcare Providers: LocalChronicle.no Sheet for Patients: SalonLookup.es Reference Range - Negative   Resp Panel by RT-PCR (Flu A&B, Covid) Nasopharyngeal Swab     Status: None   Collection Time: 01/16/21  6:33 AM   Specimen: Nasopharyngeal Swab; Nasopharyngeal(NP) swabs in vial transport medium  Result Value Ref Range Status   SARS Coronavirus 2 by RT PCR NEGATIVE NEGATIVE Final    Comment: (NOTE) SARS-CoV-2 target nucleic acids are NOT DETECTED.  The SARS-CoV-2 RNA is generally detectable in upper respiratory specimens during the acute phase of infection. The lowest concentration of SARS-CoV-2 viral copies this assay can detect is 138 copies/mL. A negative result does not preclude SARS-Cov-2 infection and should not be used as the sole basis for treatment or other patient management decisions. A negative result may occur with  improper specimen collection/handling, submission of specimen other than nasopharyngeal swab, presence of viral mutation(s) within the areas targeted by this  assay, and inadequate number of viral  copies(<138 copies/mL). A negative result must be combined with clinical observations, patient history, and epidemiological information. The expected result is Negative.  Fact Sheet for Patients:  EntrepreneurPulse.com.au  Fact Sheet for Healthcare Providers:  IncredibleEmployment.be  This test is no t yet approved or cleared by the [place] FDA and  has been authorized for detection and/or diagnosis of SARS-CoV-2 by FDA under an Emergency Use Authorization (EUA). This EUA will remain  in effect (meaning this test can be used) for the duration of the COVID-19 declaration under Section 564(b)(1) of the Act, 21 U.S.C.section 360bbb-3(b)(1), unless the authorization is terminated  or revoked sooner.       Influenza A by PCR NEGATIVE NEGATIVE Final   Influenza B by PCR NEGATIVE NEGATIVE Final    Comment: (NOTE) The Xpert Xpress SARS-CoV-2/FLU/RSV plus assay is intended as an aid in the diagnosis of influenza from Nasopharyngeal swab specimens and should not be used as a sole basis for treatment. Nasal washings and aspirates are unacceptable for Xpert Xpress SARS-CoV-2/FLU/RSV testing.  Fact Sheet for Patients: EntrepreneurPulse.com.au  Fact Sheet for Healthcare Providers: IncredibleEmployment.be  This test is not yet approved or cleared by the [place] FDA and has been authorized for detection and/or diagnosis of SARS-CoV-2 by FDA under an Emergency Use Authorization (EUA). This EUA will remain in effect (meaning this test can be used) for the duration of the COVID-19 declaration under Section 564(b)(1) of the Act, 21 U.S.C. section 360bbb-3(b)(1), unless the authorization is terminated or revoked.  Performed at [facility], [place] [address], [place], [place] [address]    Creatinine: Recent Labs    01/16/21 [id number]  CREATININE 0.90     Impression/Assessment/plan:  BPH, weak stream, frequency, PSA elevation-I discussed with the patient the nature, potential benefits, risks and alternatives to transrectal ultrasound of prostate, transrectal ultrasound and prostate biopsy, including side effects of the proposed treatment, the likelihood of the patient achieving the goals of the procedure, and any potential problems that might occur during the procedure or recuperation.  We discussed postop care and Foley catheter.  We discussed when he can resume walking and pickleball.  All questions answered. Patient elects to proceed.   Festus Aloe 01/16/2021, 7:28 AM

## 2021-01-16 NOTE — Op Note (Signed)
Preoperative diagnosis: BPH, lower urinary tract symptoms including frequency, weak stream; elevated PSA  Postoperative diagnosis: Same  Procedure: Transurethral resection of prostate; transrectal ultrasound of prostate; prostate biopsy  Surgeon: Junious Silk  Anesthesia: General  Indication for procedure: [name] is an 87 year old male with a history of BPH and lower urinary tract symptoms.  He had a TURP in 2013 with some regrowth and obstructive and irritative symptoms.  Also PSA rising quickly he was down at 1 or 2 and rose to 5.7 and then 7.3.  Findings: On exam under anesthesia the penis was circumcised and without mass or lesion.  Testicles descended bilaterally and palpably normal.  On digital rectal exam the prostate was about 60 g and smooth without hard area or nodule.  On cystoscopy the urethra was unremarkable, the prostatic urethra was obstructed by lateral lobe hypertrophy.  The bladder neck remained patent.  Bladder neck was not resected.  Ureteral orifice ease were identified.  No stone or foreign body in the bladder.  No mucosal lesions but there was some oozing and complete diagnostic cystoscopy not possible.  Moderate trabeculation.  Transrectal ultrasound of prostate: A good TURP defect was noted.  The balloon and Foley was noted.  Otherwise the seminal vesicles and prostate appeared normal without cyst or hypoechoic area.  Prostate measured 4.25 x 5.85 x 4.14 cm for final volume of 53.89 ml.   Description of procedure: After consent was obtained patient brought to the operating room.  After adequate anesthesia he was placed lithotomy position and prepped and draped in the usual sterile fashion.  Timeout was performed to confirm the patient and procedure.  Exam under anesthesia was performed.  Cystoscope was passed per urethra and the bladder inspected.  The continuous-flow sheath was then passed with the loop and handle.  The ureteral orifice ease were identified and marked.   The bladder neck was clearly visible with regrowth of the lateral lobes inside the bladder neck.  I started at the 5 and 7:00 and came down and resected to the veru and then resected from the veru posteriorly to anteriorly given that the proximal extent of the BPH had already been identified.  I then went anterior to posterior to resect the proximal portion which completed the left lobe resection.  On the right lobe I started at the bladder neck came just inside it and went anterior to posterior down to the veru to resect the right lateral lobe.  This created an excellent channel.  All the chips were evacuated.  Hemostasis was excellent at low pressure.  The scope was removed and a 20 [place] two-way catheter placed.  Bladder irrigated and clear.  I placed it on slight traction.  Transrectal ultrasound was passed per rectum and ultrasound of the prostate was performed.  Standard 12 core biopsy was then performed base to apex, lateral to medial, left to right.   The Foley catheter was irrigated and irrigation was clear.  On final DRE there was minimal rectal bleeding.  He was then awakened taken the cover room in stable condition.  Complications: None  Blood loss: 30 cc  Specimens pathology: #1 TURP chips #2 right base lateral #3 right base medial #4 right mid lateral #5 right mid medial #6 right apex lateral #7 right apex medial #8 left base lateral #9 left base medial #10 left mid lateral #11 left mid medial #12 left apex lateral #13 left apex medial  Depending on how this specimens were sent #1 could be #13.  Drains: 20 [place] two-way catheter  Disposition: Patient stable to PACU

## 2021-01-16 NOTE — Anesthesia Postprocedure Evaluation (Signed)
Anesthesia Post Note  Patient: Corey Fischer  Procedure(s) Performed: TRANSURETHRAL RESECTION OF THE PROSTATE (TURP) (Prostate) BIOPSY TRANSRECTAL ULTRASONIC PROSTATE (TUBP) (Prostate)     Patient location during evaluation: PACU Anesthesia Type: General Level of consciousness: awake and alert Pain management: pain level controlled Vital Signs Assessment: post-procedure vital signs reviewed and stable Respiratory status: spontaneous breathing, nonlabored ventilation and respiratory function stable Cardiovascular status: blood pressure returned to baseline and stable Postop Assessment: no apparent nausea or vomiting Anesthetic complications: no   No notable events documented.  Last Vitals:  Vitals:   01/16/21 0915 01/16/21 0930  BP: (!) 164/64 (!) 163/56  Pulse: (!) 53 (!) 52  Resp: 10 10  Temp:  (!) 36.4 C  SpO2: 97% 95%    Last Pain:  Vitals:   01/16/21 1013  TempSrc:   PainSc: 3                  Lidia Collum

## 2021-01-16 NOTE — Discharge Instructions (Signed)

## 2021-01-16 NOTE — Anesthesia Procedure Notes (Signed)
Procedure Name: LMA Insertion Date/Time: 01/16/2021 7:40 AM Performed by: Bufford Spikes, CRNA Pre-anesthesia Checklist: Patient identified, Emergency Drugs available, Suction available and Patient being monitored Patient Re-evaluated:Patient Re-evaluated prior to induction Oxygen Delivery Method: Circle system utilized Preoxygenation: Pre-oxygenation with 100% oxygen Induction Type: IV induction Ventilation: Mask ventilation without difficulty LMA: LMA inserted LMA Size: 4.0 Number of attempts: 1 Placement Confirmation: positive ETCO2 Tube secured with: Tape Dental Injury: Teeth and Oropharynx as per pre-operative assessment

## 2021-01-16 NOTE — Anesthesia Preprocedure Evaluation (Signed)
Anesthesia Evaluation  Patient identified by MRN, date of birth, ID band Patient awake    Reviewed: Allergy & Precautions, NPO status , Patient's Chart, lab work & pertinent test results, reviewed documented beta blocker date and time   History of Anesthesia Complications Negative for: history of anesthetic complications  Airway Mallampati: I  TM Distance: >3 FB Neck ROM: Full    Dental  (+) Dental Advisory Given, Teeth Intact   Pulmonary sleep apnea and Continuous Positive Airway Pressure Ventilation ,    Pulmonary exam normal        Cardiovascular hypertension, Pt. on medications and Pt. on home beta blockers Normal cardiovascular exam  Echo 2014: EF 55-60%, g1dd, AV sclerosis w/o stenosis, mod TR, PASP 32 Normal stress test 2014   Neuro/Psych Asymptomatic bilateral carotid artery stenosis    GI/Hepatic Neg liver ROS, hiatal hernia, GERD  ,H/o Schatzki's ring s/p balloon dilation   Endo/Other  negative endocrine ROS  Renal/GU negative Renal ROS   BENIGN PROSTATE HYPERPLASIA AND ELEVATED PSA    Musculoskeletal  (+) Arthritis , Osteoarthritis,    Abdominal   Peds  Hematology ITP   Anesthesia Other Findings   Reproductive/Obstetrics                            Anesthesia Physical Anesthesia Plan  ASA: 3  Anesthesia Plan: General   Post-op Pain Management:    Induction: Intravenous  PONV Risk Score and Plan: 2 and Ondansetron, Dexamethasone, Midazolam and Treatment may vary due to age or medical condition  Airway Management Planned: LMA  Additional Equipment: None  Intra-op Plan:   Post-operative Plan: Extubation in OR  Informed Consent: I have reviewed the patients History and Physical, chart, labs and discussed the procedure including the risks, benefits and alternatives for the proposed anesthesia with the patient or authorized representative who has indicated his/her  understanding and acceptance.     Dental advisory given  Plan Discussed with:   Anesthesia Plan Comments:         Anesthesia Quick Evaluation

## 2021-01-17 ENCOUNTER — Encounter (HOSPITAL_BASED_OUTPATIENT_CLINIC_OR_DEPARTMENT_OTHER): Payer: Self-pay | Admitting: Urology

## 2021-01-17 LAB — SURGICAL PATHOLOGY

## 2021-02-04 ENCOUNTER — Encounter (HOSPITAL_COMMUNITY): Payer: Self-pay | Admitting: Emergency Medicine

## 2021-02-04 ENCOUNTER — Emergency Department (HOSPITAL_COMMUNITY): Payer: Medicare Other

## 2021-02-04 ENCOUNTER — Emergency Department (HOSPITAL_COMMUNITY)
Admission: EM | Admit: 2021-02-04 | Discharge: 2021-02-05 | Disposition: A | Discharge status: Home / Self Care | Payer: Medicare Other | Attending: Emergency Medicine | Admitting: Emergency Medicine

## 2021-02-04 ENCOUNTER — Other Ambulatory Visit: Payer: Self-pay

## 2021-02-04 DIAGNOSIS — R739 Hyperglycemia, unspecified: Secondary | ICD-10-CM | POA: Insufficient documentation

## 2021-02-04 DIAGNOSIS — I2510 Atherosclerotic heart disease of native coronary artery without angina pectoris: Secondary | ICD-10-CM | POA: Insufficient documentation

## 2021-02-04 DIAGNOSIS — N433 Hydrocele, unspecified: Secondary | ICD-10-CM | POA: Diagnosis not present

## 2021-02-04 DIAGNOSIS — Z96642 Presence of left artificial hip joint: Secondary | ICD-10-CM | POA: Insufficient documentation

## 2021-02-04 DIAGNOSIS — N5089 Other specified disorders of the male genital organs: Secondary | ICD-10-CM | POA: Insufficient documentation

## 2021-02-04 DIAGNOSIS — N50812 Left testicular pain: Secondary | ICD-10-CM

## 2021-02-04 DIAGNOSIS — R509 Fever, unspecified: Secondary | ICD-10-CM | POA: Insufficient documentation

## 2021-02-04 DIAGNOSIS — I10 Essential (primary) hypertension: Secondary | ICD-10-CM | POA: Diagnosis not present

## 2021-02-04 DIAGNOSIS — D72829 Elevated white blood cell count, unspecified: Secondary | ICD-10-CM | POA: Insufficient documentation

## 2021-02-04 DIAGNOSIS — Z7982 Long term (current) use of aspirin: Secondary | ICD-10-CM | POA: Diagnosis not present

## 2021-02-04 DIAGNOSIS — Z79899 Other long term (current) drug therapy: Secondary | ICD-10-CM | POA: Diagnosis not present

## 2021-02-04 DIAGNOSIS — Z9079 Acquired absence of other genital organ(s): Secondary | ICD-10-CM | POA: Diagnosis not present

## 2021-02-04 LAB — CBC WITH DIFFERENTIAL/PLATELET
Abs Immature Granulocytes: 0.07 10*3/uL (ref 0.00–0.07)
Basophils Absolute: 0.1 10*3/uL (ref 0.0–0.1)
Basophils Relative: 0 %
Eosinophils Absolute: 0.1 10*3/uL (ref 0.0–0.5)
Eosinophils Relative: 1 %
HCT: 40.8 % (ref 39.0–52.0)
Hemoglobin: 13.5 g/dL (ref 13.0–17.0)
Immature Granulocytes: 1 %
Lymphocytes Relative: 9 %
Lymphs Abs: 1 10*3/uL (ref 0.7–4.0)
MCH: 29.9 pg (ref 26.0–34.0)
MCHC: 33.1 g/dL (ref 30.0–36.0)
MCV: 90.5 fL (ref 80.0–100.0)
Monocytes Absolute: 1 10*3/uL (ref 0.1–1.0)
Monocytes Relative: 8 %
Neutro Abs: 9.2 10*3/uL — ABNORMAL HIGH (ref 1.7–7.7)
Neutrophils Relative %: 81 %
Platelets: 188 10*3/uL (ref 150–400)
RBC: 4.51 MIL/uL (ref 4.22–5.81)
RDW: 12.5 % (ref 11.5–15.5)
WBC: 11.4 10*3/uL — ABNORMAL HIGH (ref 4.0–10.5)
nRBC: 0 % (ref 0.0–0.2)

## 2021-02-04 LAB — URINALYSIS, ROUTINE W REFLEX MICROSCOPIC
Bacteria, UA: NONE SEEN
Bilirubin Urine: NEGATIVE
Glucose, UA: NEGATIVE mg/dL
Ketones, ur: NEGATIVE mg/dL
Nitrite: NEGATIVE
Protein, ur: 30 mg/dL — AB
RBC / HPF: >50 RBC/hpf — ABNORMAL HIGH (ref 0–5)
Specific Gravity, Urine: 1.025 (ref 1.005–1.030)
WBC, UA: >50 WBC/hpf — ABNORMAL HIGH (ref 0–5)
pH: 6 (ref 5.0–8.0)

## 2021-02-04 LAB — COMPREHENSIVE METABOLIC PANEL
ALT: 15 U/L (ref 0–44)
AST: 16 U/L (ref 15–41)
Albumin: 3.5 g/dL (ref 3.5–5.0)
Alkaline Phosphatase: 70 U/L (ref 38–126)
Anion gap: 7 (ref 5–15)
BUN: 17 mg/dL (ref 8–23)
CO2: 25 mmol/L (ref 22–32)
Calcium: 8.7 mg/dL — ABNORMAL LOW (ref 8.9–10.3)
Chloride: 105 mmol/L (ref 98–111)
Creatinine, Ser: 1.05 mg/dL (ref 0.61–1.24)
GFR, Estimated: >60 mL/min (ref >60)
Glucose, Bld: 111 mg/dL — ABNORMAL HIGH (ref 70–99)
Potassium: 3.9 mmol/L (ref 3.5–5.1)
Sodium: 137 mmol/L (ref 135–145)
Total Bilirubin: 0.8 mg/dL (ref 0.3–1.2)
Total Protein: 6.9 g/dL (ref 6.5–8.1)

## 2021-02-04 MED ORDER — OXYCODONE-ACETAMINOPHEN 5-325 MG PO TABS
1.0000 | ORAL_TABLET | Freq: Once | ORAL | Status: AC
  Administered 2021-02-04: 1 via ORAL
  Filled 2021-02-04: qty 1

## 2021-02-04 MED ORDER — ONDANSETRON 4 MG PO TBDP
4.0000 mg | ORAL_TABLET | Freq: Once | ORAL | Status: AC
  Administered 2021-02-04: 4 mg via ORAL
  Filled 2021-02-04: qty 1

## 2021-02-04 NOTE — ED Provider Notes (Signed)
[facility] DEPT Provider Note   CSN: [id number] Arrival date & time: 02/04/21  1850     History Chief Complaint  Patient presents with   Testicle Pain    Corey Fischer is a 87 y.o. male with recent TURP procedure on 8/30 presents to the ED with left testicular pain and swelling since yesterday.  Patient received procedure for BPH and PSA elevation.  Patient's wife stated the urologist performed biopsies that were negative.  Patient had an indwelling catheter and placed postsurgery.  He has been following up with urology for a postprocedural UTI.  He finished a dose of Keflex on Friday, and was started on doxycycline then.  Patient reported he received a call from urology with the culture and was going to be started on Bactrim.  The patient reports having a temporal fever of 101.0 F and chills.  He reports some dysuria, but no urgent seizure frequency.  The patient reports hematuria that is gradually improved since surgery.  He denies any abdominal pain, back pain, nausea, vomiting, diarrhea, or constipation.  Patient reports medical history including hypertension, PVC burden, and GERD.  Recent TURP procedure for surgical history.  Daily medications include amlodipine, atenolol, atorvastatin, and pantoprazole.  Allergic to Cipro.  Denies any tobacco use, EtOH use, or illicit drug use.   Testicle Pain Pertinent negatives include no chest pain, no abdominal pain and no shortness of breath.      Past Medical History:  Diagnosis Date   Anxiety    Aortic atherosclerosis ([place])    Asymptomatic bilateral carotid artery stenosis    followed by cardiology-- Last duplex in epic 01-09-2021 bilateral  ICA 1-39%,  right  ECA >50%.   B12 deficiency    Benign prostatic hyperplasia with nocturia    urologist-- dr Hebert Soho retinal vein occlusion of left eye    followed by dr Zadie Rhine   Chronic constipation    Chronic ITP (idiopathic thrombocytopenia) ([place])     followed by pcp   Depression    Diverticulosis of colon    Elevated PSA    GERD (gastroesophageal reflux disease)    Hemorrhoids    Hiatal hernia    History of chronic gastritis    Hx of adenomatous colonic polyps    followed by dr Hilarie Fredrickson   Hyperlipidemia    Hypertension    followed by pcp   OSA on CPAP    followed by dr Jerilynn Mages. Camillo Flaming (pulm @ [facility] in [place])   Osteoarthritis    PAC (premature atrial contraction)    followed by cardiology, dr berry,  pt symptomatic with palpitations and has PVCs   Raynaud's syndrome    Right nephrolithiasis    per pt told nonobstructive   Schatzki's ring    s/p  balloon dilatation by dr Raquel James 11/ 2016   Sinus bradycardia    Symptomatic PVCs    a. Per report, monitor 05/2012 - NSR with symptomatic PVC. b. Echo 05/2012- EF 55-60%, aortic sclerosis without stenosis. c. Nuc 12/2012 - normal.   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   Branch retinal vein occlusion of left eye 11/30/2019   Aortic atherosclerosis ([place]) 10/16/2017   Generalized abdominal pain 10/18/2015   Bloating 10/18/2015  Pancreatic cyst 03/28/2015   Schatzki's ring    Hyperglycemia 07/06/2014   Symptomatic PVCs    Anxiety state 12/30/2013   Bradycardia 12/10/2012   PAC (premature atrial contraction), symptomatic 10/16/2012   Benign prostatic hyperplasia with nocturia 07/22/2011   Osteoarthritis 03/16/2010   Osteopenia 07/06/2009   Raynaud's syndrome 06/02/2009   Prostatitis 03/01/2009   CONSTIPATION, CHRONIC 07/28/2008   Thrombocytopenia ([place]) 04/21/2008   ESOPHAGEAL STRICTURE 03/04/2008   COLONIC POLYPS, HX OF 03/04/2008   Carotid artery stenosis 11/05/2007   Unspecified visual loss 10/30/2007   Essential hypertension 04/29/2007   Vitamin B12 deficiency 01/26/2007    Hyperlipidemia 01/22/2007   GERD 01/22/2007    Past Surgical History:  Procedure Laterality Date   [place] that he is "pristine "   CATARACT EXTRACTION W/ INTRAOCULAR LENS IMPLANT Bilateral 2015   CHOLECYSTECTOMY  1989   COLONOSCOPY     last one 10-26-2020 by dr pyrtle   COLONOSCOPY WITH PROPOFOL N/A 03/22/2015   Procedure: COLONOSCOPY WITH PROPOFOL;  Surgeon: Jerene Bears, MD;  Location: [facility];  Service: Endoscopy;  Laterality: N/A;   ESOPHAGOGASTRODUODENOSCOPY (EGD) WITH PROPOFOL N/A 03/22/2015   Procedure: ESOPHAGOGASTRODUODENOSCOPY (EGD) WITH PROPOFOL;  Surgeon: Jerene Bears, MD;  Location: [facility];  Service: Endoscopy;  Laterality: N/A;   LAPAROSCOPIC INGUINAL HERNIA REPAIR Bilateral 09/19/2010   LEFT SHOULDER SURG.  2006   PROSTATE BIOPSY N/A 01/16/2021   Procedure: BIOPSY TRANSRECTAL ULTRASONIC PROSTATE (TUBP);  Surgeon: Festus Aloe, MD;  Location: [facility];  Service: Urology;  Laterality: N/A;   ROTATOR CUFF REPAIR Right [place]   TOTAL HIP ARTHROPLASTY Left 05/31/2013   Procedure: TOTAL HIP ARTHROPLASTY;  Surgeon: Kerin Salen, MD;  Location: [facility];  Service: Orthopedics;  Laterality: Left;   TRANSURETHRAL RESECTION OF PROSTATE  07/22/2011   Procedure: TRANSURETHRAL RESECTION OF THE PROSTATE WITH GYRUS INSTRUMENTS;  Surgeon: Bernestine Amass, MD;  Location[facility];  Service: Urology;  Laterality: N/A;  Saline Gyrus TURP    TRANSURETHRAL RESECTION OF PROSTATE N/A 01/16/2021   Procedure: TRANSURETHRAL RESECTION OF THE PROSTATE (TURP);  Surgeon: Festus Aloe, MD;  Location[facility];  Service: Urology;  Laterality: N/A;  REQUESTING 2 HRS FOR THIS CASE       Family History  Problem Relation Age of Onset   Heart disease Mother    Colon polyps Mother    Heart disease Father    Angina Father    Cancer Maternal Grandmother    Heart  failure Maternal Grandfather    Colon cancer Neg Hx     Social History   Tobacco Use   Smoking status: Never   Smokeless tobacco: Never  Vaping Use   Vaping Use: Never used  Substance Use Topics   Alcohol use: Yes    Comment: seldem   Drug use: Never    Home Medications Prior to Admission medications   Medication Sig Start Date End Date Taking? Authorizing Provider  amLODipine (NORVASC) 2.5 MG tablet TAKE 1 TABLET AT BEDTIME Patient taking differently: Take 2.5 mg by mouth at bedtime. 03/10/18   Marin Olp, MD  Ascorbic Acid (VITAMIN C) 1000 MG tablet Take 1,000 mg by mouth daily.    [provider]  aspirin 81 MG tablet Take 81 mg by mouth daily.    [provider]  atenolol (TENORMIN) 25 MG tablet TAKE 1/2 TABLET BY  MOUTH ONCE DAILY Patient taking differently: Take 12.5 mg by mouth daily. 05/30/20   [name], MD  atorvastatin (LIPITOR) 20 MG tablet TAKE 1 TABLET BY MOUTH EVERY DAY Patient taking differently: Take 20 mg by mouth at bedtime. 04/26/20   [name], MD  Cholecalciferol (VITAMIN D3) 2000 units capsule Take 2,000 Units by mouth daily.    [provider]  Cyanocobalamin (VITAMIN B-12) 1000 MCG SUBL Place under the tongue daily.    [provider]  diazepam (VALIUM) 5 MG tablet Take 2.5 mg by mouth at bedtime as needed for anxiety.    [provider]  fexofenadine (ALLEGRA) 180 MG tablet Take 1 tablet by mouth daily. 09/12/20   [provider]  fluticasone (FLONASE) 50 MCG/ACT nasal spray Place 1 spray into both nostrils as needed. 09/12/20   [provider]  Magnesium 200 MG TABS Take 200 mg by mouth at bedtime.    [provider]  nitrofurantoin, macrocrystal-monohydrate, (MACROBID) 100 MG capsule Take 1 capsule (100 mg total) by mouth 2 (two) times daily. 01/16/21   Festus Aloe, MD  pantoprazole (PROTONIX) 40 MG tablet TAKE 1 TABLET BY MOUTH EVERY DAY Patient taking  differently: Take 40 mg by mouth daily. 12/07/20   Pyrtle, Lajuan Lines, MD  polyethylene glycol (MIRALAX / Floria Raveling) packet Take 17 g by mouth daily.     [provider]  Zinc 25 MG TABS Take 25 mg by mouth daily.     [provider]    Allergies    Ciprofloxacin  Review of Systems   Review of Systems  Constitutional:  Positive for chills and fever.  HENT:  Negative for ear pain and sore throat.   Eyes:  Negative for pain and visual disturbance.  Respiratory:  Negative for cough and shortness of breath.   Cardiovascular:  Negative for chest pain and palpitations.  Gastrointestinal:  Negative for abdominal pain, constipation, diarrhea, nausea and vomiting.  Genitourinary:  Positive for dysuria, hematuria, scrotal swelling and testicular pain. Negative for frequency and urgency.  Musculoskeletal:  Negative for arthralgias and back pain.  Skin:  Negative for color change and rash.  Neurological:  Negative for seizures and syncope.  All other systems reviewed and are negative.  Physical Exam Updated Vital Signs BP (!) 126/54   Pulse (!) 57   Temp 99 F (37.2 C)   Resp 18   Ht '5\' 7"'$  (1.702 m)   Wt 68.9 kg   SpO2 94%   BMI 23.81 kg/m   Physical Exam Vitals and nursing note reviewed. Exam conducted with a chaperone present Chief Technology Officer, Therapist, sports).  Constitutional:      Appearance: Normal appearance.  HENT:     Head: Normocephalic and atraumatic.  Eyes:     General: No scleral icterus. Cardiovascular:     Rate and Rhythm: Normal rate and regular rhythm.  Pulmonary:     Effort: Pulmonary effort is normal.     Breath sounds: Normal breath sounds.  Abdominal:     General: Abdomen is flat. Bowel sounds are normal. There is no distension.     Palpations: Abdomen is soft.     Tenderness: There is no abdominal tenderness. There is no guarding or rebound.  Genitourinary:    Comments: Right and left testicle palpable. Moderate left testicular swelling with erythema.  Testicle is  mobile. Negative Prehn sign.  No sign of Fournier's gangrene. Normal lie of testicles. No penile discharge, erythema, or swelling.  Musculoskeletal:  General: No deformity.     Cervical back: Normal range of motion.  Skin:    General: Skin is warm and dry.  Neurological:     General: No focal deficit present.     Mental Status: He is alert. Mental status is at baseline.    ED Results / Procedures / Treatments   Labs (all labs ordered are listed, but only abnormal results are displayed) Labs Reviewed  COMPREHENSIVE METABOLIC PANEL - Abnormal; Notable for the following components:      Result Value   Glucose, Bld 111 (*)    Calcium 8.7 (*)    All other components within normal limits  CBC WITH DIFFERENTIAL/PLATELET - Abnormal; Notable for the following components:   WBC 11.4 (*)    Neutro Abs 9.2 (*)    All other components within normal limits  URINALYSIS, ROUTINE W REFLEX MICROSCOPIC - Abnormal; Notable for the following components:   Color, Urine YELLOW (*)    APPearance CLEAR (*)    Hgb urine dipstick LARGE (*)    Protein, ur 30 (*)    Leukocytes,Ua LARGE (*)    RBC / HPF >50 (*)    WBC, UA >50 (*)    All other components within normal limits  URINE CULTURE    EKG None  Radiology US SCROTUM W/DOPPLER  Result Date: 02/04/2021 CLINICAL DATA:  Left testicular swelling for EXAM: SCROTAL ULTRASOUND DOPPLER ULTRASOUND OF THE TESTICLES TECHNIQUE: Complete ultrasound examination of the testicles, epididymis, and other scrotal structures was performed. Color and spectral Doppler ultrasound were also utilized to evaluate blood flow to the testicles. COMPARISON:  None. FINDINGS: Right testicle Measurements: 4.2 by 3.0 x 3.2 cm. No mass or microlithiasis visualized. Left testicle Measurements: 3.7 x 3.2 x 3.4 cm. No mass or microlithiasis visualized. Right epididymis:  Normal in size and appearance. Left epididymis:  Normal in size and appearance. Hydrocele: There is a small  left hydrocele. Within the posterior left scrotum, likely extratesticular, there is a heterogeneous hypoechoic area without vascularity measuring 2.0 by 1.8 x 1.1 cm. This may be adjacent to or exophytic from the epididymis, but is indeterminate. Varicocele:  None visualized. Pulsed Doppler interrogation of both testes demonstrates normal low resistance arterial and venous waveforms bilaterally. IMPRESSION: 1. No evidence for torsion. 2. Left scrotal mass measuring 2.0 x 1.8 x 1.1 cm. This is likely extratesticular in indeterminate. Differential diagnosis is wide including lipoma, complex fluid collection such as abscess, metastases and other primary lesions. Recommend clinical correlation and follow-up. 3. Small left hydrocele. Electronically Signed   By: Ronney Asters M.D.   On: 02/04/2021 20:48    Procedures Procedures   Medications Ordered in ED Medications  oxyCODONE-acetaminophen (PERCOCET/ROXICET) 5-325 MG per tablet 1 tablet (1 tablet Oral Given 02/04/21 2214)  ondansetron (ZOFRAN-ODT) disintegrating tablet 4 mg (4 mg Oral Given 02/04/21 2214)    ED Course  I have reviewed the triage vital signs and the nursing notes.  Pertinent labs & imaging results that were available during my care of the patient were reviewed by me and considered in my medical decision making (see chart for details).   Corey Fischer is an 87 year old male with recent TURP procedure presenting to the ED for left testicular pain and swelling since yesterday with fever and chills.  Differential diagnosis includes neoplasm, abscess, mass, testicular torsion, UTI, epididymitis, Fournier's gangrene.    I personally reviewed the labs and imaging for this patient. Mild hyperglycemia at 111 seen on CMP, otherwise normal.  CBC shows mild leukocytosis at 11.4.  Ultrasound shows no evidence of torsion, small left hydrocele, and Left scrotal mass measuring 2.0 x 1.8 x 1.1 cm. Per radiology, the differential is wide and is indeterminate  on what the mass is. Will order CT for better evaluation.  On initial evaluation, patient is lying comfortably in bed endorsing mild pain.  Ordered Percocet and Zofran for further management.  Discussed initial ultrasound findings with patient if any member and discussed the need for further imaging.  On reevaluation, patient is lying comfortably in bed reports his pain has improved.  Awaiting CT scan.   12:07 AM Care of Corey Fischer  transferred to [place] at the end of my shift as the patient will require reassessment once labs/imaging have resulted. Patient presentation, ED course, and plan of care discussed with review of all pertinent labs and imaging. Please see his/her note for further details regarding further ED course and disposition. Plan at time of handoff is pending CT results. If case is non-surgical, patient will be discharged home to follow up with urology. This may be altered or completely changed at the discretion of the oncoming team pending results of further workup.   MDM Rules/Calculators/A&P                         Final Clinical Impression(s) / ED Diagnoses Final diagnoses:  Left testicular pain  Testicular swelling, left    Rx / DC Orders ED Discharge Orders     None        Sherrell Puller, PA-C 02/05/21 0040    Jeanell Sparrow, DO 02/05/21 1604

## 2021-02-04 NOTE — ED Triage Notes (Signed)
Patient states TURP in 8/30, UTI at urologist, swelling today. Patient states fever this morning, no N/V

## 2021-02-04 NOTE — ED Provider Notes (Signed)
Emergency Medicine Provider Triage Evaluation Note  RHYS DEJONGH , a 87 y.o. male  was evaluated in triage.  Pt complains of left testicular swelling and pain.  Patient had a TURP 01/16/21, had a postop infection so started on Keflex.  Seen at [facility] urology on Friday, received a call that his urine grew back a culture.  He has not started any antibiotics since that phone call.  He reports he woke up this morning with a fever and with left testicular pain and swelling.  This is been constant since yesterday.  It worsened today.  Review of Systems  Positive: Testicular pain, testicular swelling, fever Negative: Nausea, vomiting  Physical Exam  There were no vitals taken for this visit. Gen:   Awake, no distress   Resp:  Normal effort  MSK:   Moves extremities without difficulty  Other:  Genital exam deferred  Medical Decision Making  Medically screening exam initiated at 7:59 PM.  Appropriate orders placed.  CHRISTIANJACOB MCCREDIE was informed that the remainder of the evaluation will be completed by another provider, this initial triage assessment does not replace that evaluation, and the importance of remaining in the ED until their evaluation is complete.  We will start with labs and urine culture.  We will also get Doppler ultrasound before they leave.   Sherrill Raring, PA-C 02/04/21 2031    Milton Ferguson, MD 02/05/21 1721

## 2021-02-04 NOTE — ED Provider Notes (Signed)
23:40: Assumed care of patient form PA Ransom @ change of shift pending CT A/P and likely discharge home.   Please see prior provider note for full H&P.  Briefly patient is an 87 yo male who presented to the ED with complaints of left testicular pain/swelling since yesterday. Recent surgery with cathter placement. Also recently on abx for UTI by urology, was on keflex transitioned to doxycycline, had culture come back and was recommended for  bactrim which was called into the pharmacy today but they have not picked this up yet.   [place] with abnormal testicular mass- wide ddx, plan for CT, if no significant surgical problem on CT plan @ change of shift is to discharge home with urology follow up and instructions to start bactrim- discussed with APP & attending.   CT A/P:  1. Bladder wall thickening may indicate cystitis or outlet obstruction. 2. Prostate gland is enlarged.  Small left scrotal hydrocele. 3. Surgical absence of the gallbladder. Mild bile duct dilatation is likely normal for postoperative physiology. 4. Aortic atherosclerosis  Left hydrocele on CT--> further discussed with urology given no comments regarding mass seen on [place], discussed with Dr. Gerilyn Nestle- relays CT not best to evaluate this.   Further discussed patient case with attending- personally examined the patient with attending Dr. Florina Ou- palpable mass present to left scrotal region, no significant local erythema/induration/fluctuance therefore feel abscess is less likely, given acute onset doubt cancerous process. Dr. Florina Ou recommends tx for orchitis/epididymis with levaquin and close urology follow up-patient with history of ciprofloxacin allergy, unsure of reaction, does not recall any shortness of breath, rashes, or sensation of throat closing, will give a dose of Levaquin and monitor in the emergency department.   Given a dose of Levaquin in the emergency department, tolerated well, observed for 2 hours without reaction, will  discharge home with antibiotics and pain control.  We discussed results, tx plan, need for close follow up and strict return precautions with the patient & his wife @ bedside- they confirmed understanding and are in agreement.    This is a shared visit with supervising physician Dr. Florina Ou who has independently evaluated patient & provided guidance in evaluation/management/disposition, in agreement with care    Results for orders placed or performed during the hospital encounter of 02/04/21  Comprehensive metabolic panel  Result Value Ref Range   Sodium 137 135 - 145 mmol/L   Potassium 3.9 3.5 - 5.1 mmol/L   Chloride 105 98 - 111 mmol/L   CO2 25 22 - 32 mmol/L   Glucose, Bld 111 (H) 70 - 99 mg/dL   BUN 17 8 - 23 mg/dL   Creatinine, Ser 1.05 0.61 - 1.24 mg/dL   Calcium 8.7 (L) 8.9 - 10.3 mg/dL   Total Protein 6.9 6.5 - 8.1 g/dL   Albumin 3.5 3.5 - 5.0 g/dL   AST 16 15 - 41 U/L   ALT 15 0 - 44 U/L   Alkaline Phosphatase 70 38 - 126 U/L   Total Bilirubin 0.8 0.3 - 1.2 mg/dL   GFR, Estimated >60 >60 mL/min   Anion gap 7 5 - 15  CBC with Differential  Result Value Ref Range   WBC 11.4 (H) 4.0 - 10.5 K/uL   RBC 4.51 4.22 - 5.81 MIL/uL   Hemoglobin 13.5 13.0 - 17.0 g/dL   HCT 40.8 39.0 - 52.0 %   MCV 90.5 80.0 - 100.0 fL   MCH 29.9 26.0 - 34.0 pg   MCHC 33.1 30.0 -  36.0 g/dL   RDW 12.5 11.5 - 15.5 %   Platelets 188 150 - 400 K/uL   nRBC 0.0 0.0 - 0.2 %   Neutrophils Relative % 81 %   Neutro Abs 9.2 (H) 1.7 - 7.7 K/uL   Lymphocytes Relative 9 %   Lymphs Abs 1.0 0.7 - 4.0 K/uL   Monocytes Relative 8 %   Monocytes Absolute 1.0 0.1 - 1.0 K/uL   Eosinophils Relative 1 %   Eosinophils Absolute 0.1 0.0 - 0.5 K/uL   Basophils Relative 0 %   Basophils Absolute 0.1 0.0 - 0.1 K/uL   Immature Granulocytes 1 %   Abs Immature Granulocytes 0.07 0.00 - 0.07 K/uL  Urinalysis, Routine w reflex microscopic Urine, Clean Catch  Result Value Ref Range   Color, Urine YELLOW (A) YELLOW    APPearance CLEAR (A) CLEAR   Specific Gravity, Urine 1.025 1.005 - 1.030   pH 6.0 5.0 - 8.0   Glucose, UA NEGATIVE NEGATIVE mg/dL   Hgb urine dipstick LARGE (A) NEGATIVE   Bilirubin Urine NEGATIVE NEGATIVE   Ketones, ur NEGATIVE NEGATIVE mg/dL   Protein, ur 30 (A) NEGATIVE mg/dL   Nitrite NEGATIVE NEGATIVE   Leukocytes,Ua LARGE (A) NEGATIVE   RBC / HPF >50 (H) 0 - 5 RBC/hpf   WBC, UA >50 (H) 0 - 5 WBC/hpf   Bacteria, UA NONE SEEN NONE SEEN   WBC Clumps PRESENT    Mucus PRESENT    [place] Transrectal Complete  Result Date: 01/24/2021 CLINICAL DATA:  Ultrasound was provided for use by the ordering physician.  No provider Interpretation or professional fees incurred.    US Guided Needle Placement  Result Date: 01/24/2021 CLINICAL DATA:  Ultrasound was provided for use by the ordering physician.  No provider Interpretation or professional fees incurred.    [place] Intraoperative  Result Date: 01/24/2021 CLINICAL DATA:  Ultrasound was provided for use by the ordering physician.  No provider Interpretation or professional fees incurred.    CT Abdomen Pelvis W Contrast  Result Date: 02/05/2021 CLINICAL DATA:  Testicular mass. Trans urethral prostate resection on 08/30. Urinary tract infection. Swelling today. Fever. EXAM: CT ABDOMEN AND PELVIS WITH CONTRAST TECHNIQUE: Multidetector CT imaging of the abdomen and pelvis was performed using the standard protocol following bolus administration of intravenous contrast. CONTRAST:  80m OMNIPAQUE IOHEXOL 350 MG/ML SOLN COMPARISON:  CT colonography 09/28/2020 FINDINGS: Lower chest: Linear atelectasis or fibrosis in the lung bases. Hepatobiliary: Surgical absence of the gallbladder. Mild intra and extrahepatic bile duct dilatation. This is likely normal for postoperative physiology. Pancreas: Unremarkable. No pancreatic ductal dilatation or surrounding inflammatory changes. Spleen: Normal in size without focal abnormality. Adrenals/Urinary Tract: Adrenal glands are  unremarkable. Kidneys are normal, without renal calculi, focal lesion, or hydronephrosis. Bladder wall is mildly thickened, possibly cystitis or outlet obstruction. Stomach/Bowel: Stomach, small bowel, and colon are not abnormally distended. No wall thickening or inflammatory changes appreciated. Appendix is not identified. Vascular/Lymphatic: Aortic atherosclerosis. No enlarged abdominal or pelvic lymph nodes. Reproductive: Prostate gland is enlarged. Small left scrotal hydrocele. Other: No free air or free fluid in the abdomen. Abdominal wall musculature appears intact. Musculoskeletal: Postoperative left hip arthroplasty. IMPRESSION: 1. Bladder wall thickening may indicate cystitis or outlet obstruction. 2. Prostate gland is enlarged.  Small left scrotal hydrocele. 3. Surgical absence of the gallbladder. Mild bile duct dilatation is likely normal for postoperative physiology. 4. Aortic atherosclerosis. Electronically Signed   By: WLucienne CapersM.D.   On: 02/05/2021 01:34  [place] PROSTATE BIOPSY MULTIPLE  Result Date: 01/24/2021 CLINICAL DATA:  Ultrasound was provided for use by the ordering physician.  No provider Interpretation or professional fees incurred.    US SCROTUM W/DOPPLER  Result Date: 02/04/2021 CLINICAL DATA:  Left testicular swelling for EXAM: SCROTAL ULTRASOUND DOPPLER ULTRASOUND OF THE TESTICLES TECHNIQUE: Complete ultrasound examination of the testicles, epididymis, and other scrotal structures was performed. Color and spectral Doppler ultrasound were also utilized to evaluate blood flow to the testicles. COMPARISON:  None. FINDINGS: Right testicle Measurements: 4.2 by 3.0 x 3.2 cm. No mass or microlithiasis visualized. Left testicle Measurements: 3.7 x 3.2 x 3.4 cm. No mass or microlithiasis visualized. Right epididymis:  Normal in size and appearance. Left epididymis:  Normal in size and appearance. Hydrocele: There is a small left hydrocele. Within the posterior left scrotum, likely  extratesticular, there is a heterogeneous hypoechoic area without vascularity measuring 2.0 by 1.8 x 1.1 cm. This may be adjacent to or exophytic from the epididymis, but is indeterminate. Varicocele:  None visualized. Pulsed Doppler interrogation of both testes demonstrates normal low resistance arterial and venous waveforms bilaterally. IMPRESSION: 1. No evidence for torsion. 2. Left scrotal mass measuring 2.0 x 1.8 x 1.1 cm. This is likely extratesticular in indeterminate. Differential diagnosis is wide including lipoma, complex fluid collection such as abscess, metastases and other primary lesions. Recommend clinical correlation and follow-up. 3. Small left hydrocele. Electronically Signed   By: Ronney Asters M.D.   On: 02/04/2021 20:48   VAS US CAROTID  Result Date: 01/10/2021 Carotid Arterial Duplex Study Patient Name:  DELDRICK YOUTZ  Date of Exam:   01/09/2021 Medical Rec #: [id number]    Accession #:    [id number] Date of Birth: 09-24-1931    Patient Gender: M Patient Age:   89 years Exam Location:  [facility] Procedure:      VAS US CAROTID Referring Phys: Roderic Palau BERRY --------------------------------------------------------------------------------  Indications:       Carotid artery disease and Patient denies any cerebrovascular                    symptoms today. Comparison Study:  Prior carotid duplex on 01/10/2020 showed highest velocities                    in right proximal ICA 212/18 cm/s and left proximal ICA                    203/16 cm/s. Performing Technologist: Salvadore Dom RVT, RDCS (AE), RDMS  Examination Guidelines: A complete evaluation includes B-mode imaging, spectral Doppler, color Doppler, and power Doppler as needed of all accessible portions of each vessel. Bilateral testing is considered an integral part of a complete examination. Limited examinations for reoccurring indications may be performed as noted.  Right Carotid Findings:  +----------+--------+--------+--------+------------------+---------------------+           PSV cm/sEDV cm/sStenosisPlaque DescriptionComments              +----------+--------+--------+--------+------------------+---------------------+ CCA Prox  91      13                                                      +----------+--------+--------+--------+------------------+---------------------+ CCA Mid   90      11                                                      +----------+--------+--------+--------+------------------+---------------------+  CCA Distal123     11              diffuse and                                                               calcific                                +----------+--------+--------+--------+------------------+---------------------+ ICA Prox  162     19      1-39%   diffuse and       shadowing with                                          calcific          turbulent flow        +----------+--------+--------+--------+------------------+---------------------+ ICA Mid   125     14                                                      +----------+--------+--------+--------+------------------+---------------------+ ICA Distal69      18                                                      +----------+--------+--------+--------+------------------+---------------------+ ECA       250     20      >50%                                            +----------+--------+--------+--------+------------------+---------------------+ +----------+--------+-------+---------+-------------------+           PSV cm/sEDV cmsDescribe Arm Pressure (mmHG) +----------+--------+-------+---------+-------------------+ Subclavian293            Turbulent120                 +----------+--------+-------+---------+-------------------+ +---------+--------+--+--------+-+---------+ VertebralPSV cm/s57EDV cm/s8Antegrade  +---------+--------+--+--------+-+---------+  Left Carotid Findings: +----------+--------+--------+--------+------------------+---------------------+           PSV cm/sEDV cm/sStenosisPlaque DescriptionComments              +----------+--------+--------+--------+------------------+---------------------+ CCA Prox  75      14                                                      +----------+--------+--------+--------+------------------+---------------------+ CCA Mid   95      12              focal, irregular  and calcific                            +----------+--------+--------+--------+------------------+---------------------+ CCA Distal317     42      >50%    focal and calcific                      +----------+--------+--------+--------+------------------+---------------------+ ICA Prox  252     24      1-39%                     turbulent flow due to                                                     more proximal                                                             stenosis.             +----------+--------+--------+--------+------------------+---------------------+ ICA Mid   127     24                                                      +----------+--------+--------+--------+------------------+---------------------+ ICA Distal80      18                                                      +----------+--------+--------+--------+------------------+---------------------+ ECA       390     14                                                      +----------+--------+--------+--------+------------------+---------------------+ +----------+--------+--------+----------------+-------------------+           PSV cm/sEDV cm/sDescribe        Arm Pressure (mmHG) +----------+--------+--------+----------------+-------------------+ [id number]             Multiphasic, [id number]                  +----------+--------+--------+----------------+-------------------+ +---------+--------+--+--------+-+---------+ VertebralPSV cm/s52EDV cm/s9Antegrade +---------+--------+--+--------+-+---------+ Distal CCA velocities have increased since prior exam.  Summary: Right Carotid: Velocities in the right ICA are consistent with a 1-39% stenosis.                Non-hemodynamically significant plaque <50% noted in the CCA. The                ECA appears >50% stenosed. Left Carotid: Velocities in the left ICA are consistent with a 1-39% stenosis.               Hemodynamically significant plaque >50% visualized in the CCA. Vertebrals:  Bilateral vertebral arteries  demonstrate antegrade flow. Subclavians: Right subclavian artery flow was disturbed. Normal flow              hemodynamics were seen in the left subclavian artery. *See table(s) above for measurements and observations. Suggest follow up study in 12 months. Electronically signed by Carlyle Dolly MD on 01/10/2021 at 4:24:12 PM.    Final          Amaryllis Dyke, PA-C 02/05/21 [id number]    Shanon Rosser, MD 02/05/21 1051

## 2021-02-05 ENCOUNTER — Emergency Department (HOSPITAL_COMMUNITY): Payer: Medicare Other

## 2021-02-05 DIAGNOSIS — N433 Hydrocele, unspecified: Secondary | ICD-10-CM | POA: Diagnosis not present

## 2021-02-05 LAB — URINE CULTURE: Culture: <10000 — AB

## 2021-02-05 MED ORDER — LEVOFLOXACIN 500 MG PO TABS
500.0000 mg | ORAL_TABLET | Freq: Once | ORAL | Status: AC
  Administered 2021-02-05: 500 mg via ORAL
  Filled 2021-02-05: qty 1

## 2021-02-05 MED ORDER — OXYCODONE-ACETAMINOPHEN 5-325 MG PO TABS
1.0000 | ORAL_TABLET | Freq: Once | ORAL | Status: AC
  Administered 2021-02-05: 1 via ORAL
  Filled 2021-02-05: qty 1

## 2021-02-05 MED ORDER — IOHEXOL 350 MG/ML SOLN
80.0000 mL | Freq: Once | INTRAVENOUS | Status: AC | PRN
  Administered 2021-02-05: 80 mL via INTRAVENOUS

## 2021-02-05 MED ORDER — HYDROCODONE-ACETAMINOPHEN 5-325 MG PO TABS: 1.0000 | ORAL_TABLET | Freq: Four times a day (QID) | ORAL | 0 refills | Status: DC | PRN

## 2021-02-05 MED ORDER — LEVOFLOXACIN 500 MG PO TABS: 500.0000 mg | ORAL_TABLET | Freq: Every day | ORAL | 0 refills | Status: AC

## 2021-02-05 NOTE — Discharge Instructions (Addendum)
You were seen in the ER today for left testicle pain. Your CT scan showed a hydrocele- see attached handout.  Your ultrasound showed a mass in your left testicle-we suspect that you may have epididymitis, otherwise known as an infection of the testicle area.  We are sending in a prescription for Levaquin to take for the infection as well as Norco to take for pain.    -norco-this is a narcotic/controlled substance medication that has potential addicting qualities.  We recommend that you take 1-2 tablets every 6 hours as needed for severe pain.  Do not drive or operate heavy machinery when taking this medicine as it can be sedating. Do not drink alcohol or take other sedating medications when taking this medicine for safety reasons.  Keep this out of reach of small children.  Please be aware this medicine has Tylenol in it (325 mg/tab) do not exceed the maximum dose of Tylenol in a day per over the counter recommendations should you decide to supplement with Tylenol over the counter.   - Levaquin-this antibiotic makes you high risk for tendon injury, please avoid quick movements/running/sports, start this tomorrow as we gave your first dose today.  Please follow-up with your urologist as soon as possible.   Return to the ER for new or worsening symptoms including but not limited to new or worsening pain, fever, pain in your flank/back area, inability to keep fluids down, or any other concerns.

## 2021-03-14 ENCOUNTER — Encounter: Payer: Self-pay | Admitting: Cardiovascular Disease

## 2021-03-14 ENCOUNTER — Ambulatory Visit (INDEPENDENT_AMBULATORY_CARE_PROVIDER_SITE_OTHER): Payer: Medicare Other | Admitting: Cardiovascular Disease

## 2021-03-14 ENCOUNTER — Other Ambulatory Visit: Payer: Self-pay

## 2021-03-14 DIAGNOSIS — I10 Essential (primary) hypertension: Secondary | ICD-10-CM | POA: Diagnosis not present

## 2021-03-14 DIAGNOSIS — E782 Mixed hyperlipidemia: Secondary | ICD-10-CM | POA: Diagnosis not present

## 2021-03-14 DIAGNOSIS — I6523 Occlusion and stenosis of bilateral carotid arteries: Secondary | ICD-10-CM | POA: Diagnosis not present

## 2021-03-14 NOTE — Patient Instructions (Signed)
Medication Instructions:  Your physician recommends that you continue on your current medications as directed. Please refer to the Current Medication list given to you today.  *If you need a refill on your cardiac medications before your next appointment, please call your pharmacy*   Testing/Procedures: Your physician has requested that you have a carotid duplex. This test is an ultrasound of the carotid arteries in your neck. It looks at blood flow through these arteries that supply the brain with blood. Allow one hour for this exam. There are no restrictions or special instructions. To be done in August 2023. This procedure is done at [facility].   Follow-Up: At [facility], you and your health needs are our priority.  As part of our continuing mission to provide you with exceptional heart care, we have created designated Provider Care Teams.  These Care Teams include your primary Cardiologist (physician) and Advanced Practice Providers (APPs -  Physician Assistants and Nurse Practitioners) who all work together to provide you with the care you need, when you need it.  We recommend signing up for the patient portal called "MyChart".  Sign up information is provided on this After Visit Summary.  MyChart is used to connect with patients for Virtual Visits (Telemedicine).  Patients are able to view lab/test results, encounter notes, upcoming appointments, etc.  Non-urgent messages can be sent to your provider as well.   To learn more about what you can do with MyChart, go to [email or web address].    Your next appointment:   12 month(s)  The format for your next appointment:   In Person  Provider:   Quay Burow, MD

## 2021-03-14 NOTE — Assessment & Plan Note (Signed)
History of carotid artery disease with Dopplers performed 01/09/2021 revealing mild bilateral ICA stenosis left greater than right.  Does also have left common carotid artery stenosis as well.  He has bilateral carotid bruit.  This will be followed on an annual basis.

## 2021-03-14 NOTE — Assessment & Plan Note (Signed)
History of hyperlipidemia on statin therapy with lipid profile performed 02/02/2020 revealing a total cholesterol 133, LDL 62 and HDL 50.

## 2021-03-14 NOTE — Assessment & Plan Note (Signed)
History of essential hypertension a blood pressure measured today at 136/74.  He is on amlodipine and atenolol.

## 2021-03-14 NOTE — Progress Notes (Signed)
   03/14/2021 Corey Fischer   01/08/1932  [id number]  Primary Physician Piazza, Michael J, MD Primary Cardiologist: Jonathan J Berry MD FACP, FACC, FAHA, FSCAI  HPI:  Corey Fischer is a 87 y.o.  thin and fit appearing married Caucasian male father of 2, grandfather to 4 grandchildren who was self-referred for evaluation of symptomatic PVCs.I last saw him in the office  02/02/2020..  Since I saw him a year ago he has moved into [place] 09/18/2018 on the third floor which he is enjoying.  This is a retirement community, independent living.   He is retired from working at [employer] 7 years ago. His risk factors are remarkable for treated hypertension and hyperlipidemia. He does not smoke and drinks socially. His family history is remarkable for a mother who had atrial fibrillation and a father who had angina, but no one has had documented ischemic heart disease. He has never had an MI or a stroke and denies chest pain or shortness of breath. His surgical history is remarkable for remote TURP, cholecystectomy, and hernia repair.  He has had PVCs since graduate school and was on propranolol remotely, prescribed by Dr. Joe Melvern. Over the last 2 months he has had progressive PVCs which he is more aware of and symptomatic from. He has cut out his caffeine. Lab work performed by Dr. Jenkins revealed a normal TSH.  He saw Laura Ingold registered nurse practitioner back at the end of May 2014  because of palpitations have gotten worse. He was started on Cardizem 30 mg by mouth twice a day and his simvastatin was decreased from 40-20 mg a day. The 2-D echo is essentially normal. He has been checking his blood pressures which are somewhat sporadic but averaging 1:30 to 140 range systolic.   We have stopped his diltiazem which would cause contributing to his Wenkebach and symptoms and changed him to amlodipine. He had no further symptoms. He denies chest pain or shortness of breath. His lipid profile was  excellent on statin therapy last measured 05/07/13.   He saw Dayna Dunn in our [address] office 03/07/14 because of several days of symptom of palpitations. His EKG showed PACs. He was initially begun on metoprolol and later switched to low-dose atenolol resulted in improvement in his symptoms.   Since being seen back in 6 months ago he's remained currently stable. He gets occasional episodes of symptomatic PVCs which are self-limited. He denies chest pain or shortness of breath. He does have moderate bilateral internal carotid artery stenosis which we followed by duplex ultrasound. He has been seen in the emergency room several times for abdominal pain of unclear etiology. He did have an abdominal pelvic CTA which parenthetically showed severe aortoiliac atherosclerotic changes although he has 2+ pedal pulses and denies claudication.   Since I saw him 12 months ago  he continues to be active and exercises.  He plays pickle ball 3-4 times a week . He is happy with the living circumstances.  He has really no palpitations and denies chest pain or shortness of breath.  He recently had a TURP 01/16/2021 complicated by infection requiring outpatient antibiotics.   Current Meds  Medication Sig   amLODipine (NORVASC) 2.5 MG tablet TAKE 1 TABLET AT BEDTIME (Patient taking differently: Take 2.5 mg by mouth at bedtime.)   Ascorbic Acid (VITAMIN C) 1000 MG tablet Take 1,000 mg by mouth daily.   aspirin 81 MG tablet Take 81 mg by mouth daily.     atenolol (TENORMIN) 25 MG tablet TAKE 1/2 TABLET BY MOUTH ONCE DAILY (Patient taking differently: Take 12.5 mg by mouth daily.)   atorvastatin (LIPITOR) 20 MG tablet TAKE 1 TABLET BY MOUTH EVERY DAY (Patient taking differently: Take 20 mg by mouth at bedtime.)   Cholecalciferol (VITAMIN D3) 2000 units capsule Take 2,000 Units by mouth daily.   Cyanocobalamin (VITAMIN B-12) 1000 MCG SUBL Place under the tongue daily.   fexofenadine (ALLEGRA) 180 MG tablet Take 1  tablet by mouth daily.   Magnesium 200 MG TABS Take 200 mg by mouth at bedtime.   pantoprazole (PROTONIX) 40 MG tablet TAKE 1 TABLET BY MOUTH EVERY DAY (Patient taking differently: Take 40 mg by mouth daily.)   polyethylene glycol (MIRALAX / GLYCOLAX) packet Take 17 g by mouth daily.    Zinc 25 MG TABS Take 25 mg by mouth daily.      Allergies  Allergen Reactions   Ciprofloxacin     Unknown reaction    Social History   Socioeconomic History   Marital status: Married    Spouse name: Not on file   Number of children: 2   Years of education: Not on file   Highest education level: Not on file  Occupational History   Occupation: retired    Employer: RETIRED  Tobacco Use   Smoking status: Never   Smokeless tobacco: Never  Vaping Use   Vaping Use: Never used  Substance and Sexual Activity   Alcohol use: Yes    Comment: seldem   Drug use: Never   Sexual activity: Yes  Other Topics Concern   Not on file  Social History Narrative   Grew up in [place] town in [place]. 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 Met wife in [place]. At 28, got married and went to college-undergrad in forestry as well as masters, doctorate in plant pathology.  Worked at [employer] which eventually became [employer]. Lived in [place] and [place] before eventually moving to [place]. 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       Married over 50 years. 54 years 01/2014. 2 married children with 2 children each. 1 son near [place]. 1 son in [place]-works with house of rep.       Hobbies: work out most mornings 5 days a week (spin, elipitical), racquetball until 2014 after hip surgery.    Social Determinants of Health   Financial Resource Strain: Not on file  Food Insecurity: Not on file  Transportation Needs: Not on file  Physical Activity: Not on file  Stress: Not on file  Social Connections: Not on file  Intimate Partner Violence: Not on file     Review of Systems: General:  negative for chills, fever, night sweats or weight changes.  Cardiovascular: negative for chest pain, dyspnea on exertion, edema, orthopnea, palpitations, paroxysmal nocturnal dyspnea or shortness of breath Dermatological: negative for rash Respiratory: negative for cough or wheezing Urologic: negative for hematuria Abdominal: negative for nausea, vomiting, diarrhea, bright red blood per rectum, melena, or hematemesis Neurologic: negative for visual changes, syncope, or dizziness All other systems reviewed and are otherwise negative except as noted above.    Blood pressure 136/74, pulse (!) 52, height 5' 6" (1.676 m), weight 151 lb 3.2 oz (68.6 kg).  General appearance: alert and no distress Neck: no adenopathy, no JVD, supple, symmetrical, trachea midline, thyroid not enlarged, symmetric, no tenderness/mass/nodules, and soft bilateral carotid bruits Lungs: clear to auscultation bilaterally Heart: regular   rate and rhythm, S1, S2 normal, no murmur, click, rub or gallop Extremities: extremities normal, atraumatic, no cyanosis or edema Pulses: 2+ and symmetric Skin: Skin color, texture, turgor normal. No rashes or lesions Neurologic: Grossly normal  EKG sinus bradycardia 52 with left axis deviation.  I personally reviewed this EKG.  ASSESSMENT AND PLAN:   Hyperlipidemia History of hyperlipidemia on statin therapy with lipid profile performed 02/02/2020 revealing a total cholesterol 133, LDL 62 and HDL 50.  Essential hypertension History of essential hypertension a blood pressure measured today at 136/74.  He is on amlodipine and atenolol.  Carotid artery stenosis History of carotid artery disease with Dopplers performed 01/09/2021 revealing mild bilateral ICA stenosis left greater than right.  Does also have left common carotid artery stenosis as well.  He has bilateral carotid bruit.  This will be followed on an annual basis.     [name] MD FACP,FACC,FAHA,  [facility] 03/14/2021 1:59 PM

## 2021-04-27 ENCOUNTER — Other Ambulatory Visit: Payer: Self-pay | Admitting: Cardiovascular Disease

## 2021-04-30 ENCOUNTER — Encounter (INDEPENDENT_AMBULATORY_CARE_PROVIDER_SITE_OTHER): Payer: Self-pay

## 2021-05-25 ENCOUNTER — Encounter: Payer: Self-pay | Admitting: Cardiovascular Disease

## 2021-07-22 ENCOUNTER — Other Ambulatory Visit: Payer: Self-pay | Admitting: Cardiovascular Disease

## 2021-08-29 ENCOUNTER — Other Ambulatory Visit: Payer: Self-pay | Admitting: Internal Medicine

## 2021-10-29 ENCOUNTER — Encounter (INDEPENDENT_AMBULATORY_CARE_PROVIDER_SITE_OTHER): Payer: Self-pay

## 2022-01-03 ENCOUNTER — Encounter (INDEPENDENT_AMBULATORY_CARE_PROVIDER_SITE_OTHER): Payer: Self-pay | Admitting: Ophthalmology

## 2022-01-03 ENCOUNTER — Ambulatory Visit (INDEPENDENT_AMBULATORY_CARE_PROVIDER_SITE_OTHER): Payer: Medicare Other | Admitting: Ophthalmology

## 2022-01-03 DIAGNOSIS — H348322 Tributary (branch) retinal vein occlusion, left eye, stable: Secondary | ICD-10-CM | POA: Diagnosis not present

## 2022-01-03 DIAGNOSIS — H35071 Retinal telangiectasis, right eye: Secondary | ICD-10-CM

## 2022-01-03 DIAGNOSIS — D3131 Benign neoplasm of right choroid: Secondary | ICD-10-CM

## 2022-01-03 NOTE — Assessment & Plan Note (Signed)
No active leakage

## 2022-01-03 NOTE — Assessment & Plan Note (Signed)
Stable over time. 

## 2022-01-03 NOTE — Progress Notes (Signed)
01/03/2022     CHIEF COMPLAINT Patient presents for  Chief Complaint  Patient presents with   Retina Follow Up      HISTORY OF PRESENT ILLNESS: Corey Fischer is a 88 y.o. male who presents to the clinic today for:   HPI     Retina Follow Up           Diagnosis: Other   Laterality: right eye   Severity: moderate   Course: gradually worsening         Comments   1 yr fu ou oct. Pt stated vision has been slightly blurry since last visit. Pts symptom has been ongoing for the past couple of months. Pt had a TURP on 01/16/2021        Last edited by Silvestre Moment on 01/03/2022  1:19 PM.      Referring physician: Monna Fam, MD [place],  [place] [address]  HISTORICAL INFORMATION:   Selected notes from the MEDICAL RECORD NUMBER    Lab Results  Component Value Date   HGBA1C 5.9 03/10/2018     CURRENT MEDICATIONS: No current outpatient medications on file. (Ophthalmic Drugs)   No current facility-administered medications for this visit. (Ophthalmic Drugs)   Current Outpatient Medications (Other)  Medication Sig   amLODipine (NORVASC) 2.5 MG tablet TAKE 1 TABLET AT BEDTIME (Patient taking differently: Take 2.5 mg by mouth at bedtime.)   Ascorbic Acid (VITAMIN C) 1000 MG tablet Take 1,000 mg by mouth daily.   aspirin 81 MG tablet Take 81 mg by mouth daily.   atenolol (TENORMIN) 25 MG tablet TAKE 1/2 TABLET BY MOUTH EVERY DAY   atorvastatin (LIPITOR) 20 MG tablet TAKE 1 TABLET BY MOUTH EVERY DAY   Cholecalciferol (VITAMIN D3) 2000 units capsule Take 2,000 Units by mouth daily.   Cyanocobalamin (VITAMIN B-12) 1000 MCG SUBL Place under the tongue daily.   diazepam (VALIUM) 5 MG tablet Take 2.5 mg by mouth at bedtime as needed for anxiety. (Patient not taking: Reported on 03/14/2021)   fexofenadine (ALLEGRA) 180 MG tablet Take 1 tablet by mouth daily.   fluticasone (FLONASE) 50 MCG/ACT nasal spray Place 1 spray into both nostrils as needed.  (Patient not taking: Reported on 03/14/2021)   HYDROcodone-acetaminophen (NORCO/VICODIN) 5-325 MG tablet Take 1 tablet by mouth every 6 (six) hours as needed. (Patient not taking: Reported on 03/14/2021)   Magnesium 200 MG TABS Take 200 mg by mouth at bedtime.   nitrofurantoin, macrocrystal-monohydrate, (MACROBID) 100 MG capsule Take 1 capsule (100 mg total) by mouth 2 (two) times daily. (Patient not taking: Reported on 03/14/2021)   pantoprazole (PROTONIX) 40 MG tablet TAKE 1 TABLET BY MOUTH EVERY DAY   polyethylene glycol (MIRALAX / GLYCOLAX) packet Take 17 g by mouth daily.    Zinc 25 MG TABS Take 25 mg by mouth daily.    No current facility-administered medications for this visit. (Other)      REVIEW OF SYSTEMS: ROS   Negative for: Constitutional, Gastrointestinal, Neurological, Skin, Genitourinary, Musculoskeletal, HENT, Endocrine, Cardiovascular, Eyes, Respiratory, Psychiatric, Allergic/Imm, Heme/Lymph Last edited by Silvestre Moment on 01/03/2022  1:19 PM.       ALLERGIES Allergies  Allergen Reactions   Ciprofloxacin     Unknown reaction    PAST MEDICAL HISTORY Past Medical History:  Diagnosis Date   Anxiety    Aortic atherosclerosis ([place])    Asymptomatic bilateral carotid artery stenosis    followed by cardiology-- Last duplex in epic 01-09-2021 bilateral  ICA 1-39%,  right  ECA >50%.   B12 deficiency    Benign prostatic hyperplasia with nocturia    urologist-- dr Hebert Soho retinal vein occlusion of left eye    followed by dr Zadie Rhine   Chronic constipation    Chronic ITP (idiopathic thrombocytopenia) ([place])    followed by pcp   Depression    Diverticulosis of colon    Elevated PSA    GERD (gastroesophageal reflux disease)    Hemorrhoids    Hiatal hernia    History of chronic gastritis    Hx of adenomatous colonic polyps    followed by dr Hilarie Fredrickson   Hyperlipidemia    Hypertension    followed by pcp   OSA on CPAP    followed by dr Jerilynn Mages. Camillo Flaming (pulm @ [facility] in  [place])   Osteoarthritis    PAC (premature atrial contraction)    followed by cardiology, dr berry,  pt symptomatic with palpitations and has PVCs   Raynaud's syndrome    Right nephrolithiasis    per pt told nonobstructive   Schatzki's ring    s/p  balloon dilatation by dr Raquel James 11/ 2016   Sinus bradycardia    Symptomatic PVCs    a. Per report, monitor 05/2012 - NSR with symptomatic PVC. b. Echo 05/2012- EF 55-60%, aortic sclerosis without stenosis. c. Nuc 12/2012 - normal.   Wandering (atrial) pacemaker    Wears hearing aid in both ears    Past Surgical History:  Procedure Laterality Date   [place] that he is "pristine "   CATARACT EXTRACTION W/ INTRAOCULAR LENS IMPLANT Bilateral 2015   CHOLECYSTECTOMY  1989   COLONOSCOPY     last one 10-26-2020 by dr pyrtle   COLONOSCOPY WITH PROPOFOL N/A 03/22/2015   Procedure: COLONOSCOPY WITH PROPOFOL;  Surgeon: Jerene Bears, MD;  Location: [facility];  Service: Endoscopy;  Laterality: N/A;   ESOPHAGOGASTRODUODENOSCOPY (EGD) WITH PROPOFOL N/A 03/22/2015   Procedure: ESOPHAGOGASTRODUODENOSCOPY (EGD) WITH PROPOFOL;  Surgeon: Jerene Bears, MD;  Location: [facility];  Service: Endoscopy;  Laterality: N/A;   LAPAROSCOPIC INGUINAL HERNIA REPAIR Bilateral 09/19/2010   LEFT SHOULDER SURG.  2006   PROSTATE BIOPSY N/A 01/16/2021   Procedure: BIOPSY TRANSRECTAL ULTRASONIC PROSTATE (TUBP);  Surgeon: Festus Aloe, MD;  [facility];  Service: Urology;  Laterality: N/A;   ROTATOR CUFF REPAIR Right [place]   TOTAL HIP ARTHROPLASTY Left 05/31/2013   Procedure: TOTAL HIP ARTHROPLASTY;  Surgeon: Kerin Salen, MD;  Location: [facility];  Service: Orthopedics;  Laterality: Left;   TRANSURETHRAL RESECTION OF PROSTATE  07/22/2011   Procedure: TRANSURETHRAL RESECTION OF THE PROSTATE WITH GYRUS INSTRUMENTS;  Surgeon: Bernestine Amass, MD;  [facility];  Service: Urology;  Laterality: N/A;  [place] TURP    TRANSURETHRAL RESECTION OF PROSTATE N/A 01/16/2021   Procedure: TRANSURETHRAL RESECTION OF THE PROSTATE (TURP);  Surgeon: Festus Aloe, MD;  Location: [facility];  Service: Urology;  Laterality: N/A;  REQUESTING 2 HRS FOR THIS CASE    FAMILY HISTORY Family History  Problem Relation Age of Onset   Heart disease Mother    Colon polyps Mother    Heart disease Father    Angina Father    Cancer Maternal Grandmother    Heart failure Maternal Grandfather    Colon cancer Neg Hx  SOCIAL HISTORY Social History   Tobacco Use   Smoking status: Never   Smokeless tobacco: Never  Vaping Use   Vaping Use: Never used  Substance Use Topics   Alcohol use: Yes    Comment: seldem   Drug use: Never         OPHTHALMIC EXAM:  Base Eye Exam     Visual Acuity (ETDRS)       Right Left   Dist cc 20/20 20/25 -1    Correction: Glasses         Tonometry (Tonopen, 1:25 PM)       Right Left   Pressure 20 20         Pupils       Pupils Dark Light Shape React APD   Right PERRL 4 3 Round Brisk None   Left PERRL 4 3 Round Brisk None         Visual Fields       Left Right    Full Full         Extraocular Movement       Right Left    Full, Ortho Full, Ortho         Neuro/Psych     Oriented x3: Yes   Mood/Affect: Normal         Dilation     Both eyes: 1.0% Mydriacyl, 2.5% Phenylephrine @ 1:25 PM           Slit Lamp and Fundus Exam     External Exam       Right Left   External Normal Normal         Slit Lamp Exam       Right Left   Lids/Lashes Normal Normal   Conjunctiva/Sclera White and quiet White and quiet   Cornea Clear Clear   Anterior Chamber Deep and quiet Deep and quiet   Iris Round and reactive Round and reactive   Lens Posterior chamber intraocular lens Posterior chamber intraocular lens   Anterior Vitreous Normal Normal         Fundus  Exam       Right Left   Posterior Vitreous Posterior vitreous detachment Posterior vitreous detachment   Disc Normal Collaterals on the nerve   C/D Ratio 0.25 0.25   Macula Normal Microaneurysms, no exudates, no macular thickening   Vessels Normal Old inferior retinal vein occlusion, compensated stable   Periphery ,, Reticular degeneration Normal            IMAGING AND PROCEDURES  Imaging and Procedures for 01/03/22  OCT, Retina - OU - Both Eyes       Right Eye Quality was good. Scan locations included subfoveal. Central Foveal Thickness: 276. Progression has been stable. Findings include normal foveal contour.   Left Eye Quality was good. Scan locations included subfoveal. Central Foveal Thickness: 244. Progression has been stable. Findings include normal foveal contour, retinal drusen .   Notes OS with posterior vitreous detachment, also with areas of atrophy of the macula diffusely in the region of prior retinal vein occlusion, no active CME                ASSESSMENT/PLAN:  Branch retinal vein occlusion of left eye Stable, collateralized vessels.  No active CME  Choroidal nevus of right eye Stable over time  Retinal telangiectasia of right eye No active leakage     ICD-10-CM   1. Retinal telangiectasia of right eye  H35.071 OCT, Retina - OU -  Both Eyes    2. Stable branch retinal vein occlusion of left eye  H34.8322     3. Choroidal nevus of right eye  D31.31       1.  OS, stable BRVO.  No active changes  2.  OD, with stable nevus over time  3.  No CME OD  Ophthalmic Meds Ordered this visit:  No orders of the defined types were placed in this encounter.      Return in about 1 year (around 01/04/2023) for DILATE OU, COLOR FP.  There are no Patient Instructions on file for this visit.   Explained the diagnoses, plan, and follow up with the patient and they expressed understanding.  Patient expressed understanding of the importance of  proper follow up care.   Clent Demark Gloristine Turrubiates M.D. Diseases & Surgery of the Retina and Vitreous [facility] 01/03/22     Abbreviations: M myopia (nearsighted); A astigmatism; H hyperopia (farsighted); P presbyopia; Mrx spectacle prescription;  CTL contact lenses; OD right eye; OS left eye; OU both eyes  XT exotropia; ET esotropia; PEK punctate epithelial keratitis; PEE punctate epithelial erosions; DES dry eye syndrome; MGD meibomian gland dysfunction; ATs artificial tears; PFAT's preservative free artificial tears; [place] nuclear sclerotic cataract; PSC posterior subcapsular cataract; ERM epi-retinal membrane; PVD posterior vitreous detachment; RD retinal detachment; DM diabetes mellitus; DR diabetic retinopathy; NPDR non-proliferative diabetic retinopathy; PDR proliferative diabetic retinopathy; CSME clinically significant macular edema; DME diabetic macular edema; dbh dot blot hemorrhages; CWS cotton wool spot; POAG primary open angle glaucoma; C/D cup-to-disc ratio; HVF humphrey visual field; GVF goldmann visual field; OCT optical coherence tomography; IOP intraocular pressure; BRVO Branch retinal vein occlusion; CRVO central retinal vein occlusion; CRAO central retinal artery occlusion; BRAO branch retinal artery occlusion; RT retinal tear; SB scleral buckle; PPV pars plana vitrectomy; VH Vitreous hemorrhage; PRP panretinal laser photocoagulation; IVK intravitreal kenalog; VMT vitreomacular traction; MH Macular hole;  NVD neovascularization of the disc; NVE neovascularization elsewhere; AREDS age related eye disease study; ARMD age related macular degeneration; POAG primary open angle glaucoma; EBMD epithelial/anterior basement membrane dystrophy; ACIOL anterior chamber intraocular lens; IOL intraocular lens; PCIOL posterior chamber intraocular lens; Phaco/IOL phacoemulsification with intraocular lens placement; [place] photorefractive keratectomy; LASIK laser assisted in situ keratomileusis;  HTN hypertension; DM diabetes mellitus; COPD chronic obstructive pulmonary disease

## 2022-01-03 NOTE — Assessment & Plan Note (Signed)
Stable, collateralized vessels.  No active CME

## 2022-01-09 ENCOUNTER — Ambulatory Visit (HOSPITAL_COMMUNITY)
Admission: RE | Admit: 2022-01-09 | Discharge: 2022-01-09 | Disposition: A | Discharge status: Home / Self Care | Payer: Medicare Other | Source: Ambulatory Visit | Attending: Cardiovascular Disease | Admitting: Cardiovascular Disease

## 2022-01-09 DIAGNOSIS — I6523 Occlusion and stenosis of bilateral carotid arteries: Secondary | ICD-10-CM

## 2022-02-09 ENCOUNTER — Other Ambulatory Visit: Payer: Self-pay | Admitting: Internal Medicine

## 2022-02-09 ENCOUNTER — Other Ambulatory Visit: Payer: Self-pay | Admitting: Cardiovascular Disease

## 2022-03-19 ENCOUNTER — Encounter: Payer: Self-pay | Admitting: Cardiovascular Disease

## 2022-03-19 ENCOUNTER — Ambulatory Visit: Payer: Medicare Other | Attending: Cardiovascular Disease | Admitting: Cardiovascular Disease

## 2022-03-19 DIAGNOSIS — E782 Mixed hyperlipidemia: Secondary | ICD-10-CM | POA: Diagnosis not present

## 2022-03-19 DIAGNOSIS — I6522 Occlusion and stenosis of left carotid artery: Secondary | ICD-10-CM | POA: Diagnosis not present

## 2022-03-19 DIAGNOSIS — I491 Atrial premature depolarization: Secondary | ICD-10-CM | POA: Insufficient documentation

## 2022-03-19 DIAGNOSIS — I6523 Occlusion and stenosis of bilateral carotid arteries: Secondary | ICD-10-CM | POA: Diagnosis not present

## 2022-03-19 DIAGNOSIS — I10 Essential (primary) hypertension: Secondary | ICD-10-CM | POA: Diagnosis not present

## 2022-03-19 NOTE — Progress Notes (Addendum)
03/19/2022 Corey Fischer   09/17/1931  [id number]  Primary Physician [name], MD Primary Cardiologist: [name] MD [name], [place]  HPI:  Corey Fischer is a 88 y.o.    thin and fit appearing married Caucasian male father of 2, grandfather to 4 grandchildren who was self-referred for evaluation of symptomatic PVCs.I last saw him in the office 03/14/2021.[name]  Since I saw him a year ago he has moved into [facility] 09/18/2018 on the third floor which he is enjoying.  This is a retirement community, independent living.   He is retired from working at [employer] 7 years ago. His risk factors are remarkable for treated hypertension and hyperlipidemia. He does not smoke and drinks socially. His family history is remarkable for a mother who had atrial fibrillation and a father who had angina, but no one has had documented ischemic heart disease. He has never had an MI or a stroke and denies chest pain or shortness of breath. His surgical history is remarkable for remote TURP, cholecystectomy, and hernia repair.  He has had PVCs since graduate school and was on propranolol remotely, prescribed by Dr. Coralie Keens. Over the last 2 months he has had progressive PVCs which he is more aware of and symptomatic from. He has cut out his caffeine. Lab work performed by Dr. Arnoldo Morale revealed a normal TSH.  He saw [name] registered nurse practitioner back at the end of May 2014  because of palpitations have gotten worse. He was started on Cardizem 30 mg by mouth twice a day and his simvastatin was decreased from 40-20 mg a day. The 2-D echo is essentially normal. He has been checking his blood pressures which are somewhat sporadic but averaging 1:30 to 409 range systolic.   We have stopped his diltiazem which would cause contributing to his Wenkebach and symptoms and changed him to amlodipine. He had no further symptoms. He denies chest pain or shortness of breath. His lipid profile was  excellent on statin therapy last measured 05/07/13.   He saw [name] in our [employer] office 03/07/14 because of several days of symptom of palpitations. His EKG showed PACs. He was initially begun on metoprolol and later switched to low-dose atenolol resulted in improvement in his symptoms.   Since being seen back in 6 months ago he's remained currently stable. He gets occasional episodes of symptomatic PVCs which are self-limited. He denies chest pain or shortness of breath. He does have moderate bilateral internal carotid artery stenosis which we followed by duplex ultrasound. He has been seen in the emergency room several times for abdominal pain of unclear etiology. He did have an abdominal pelvic CTA which parenthetically showed severe aortoiliac atherosclerotic changes although he has 2+ pedal pulses and denies claudication.   Since I saw him 12 months ago  he continues to be active and exercises.  He plays pickle ball 3-4 times a week . He is happy with the living circumstances.  He has occasional palpitations which he attributes to PVCs which are unknown chronic issue dating back to graduate school that are exacerbated by caffeine.  He says he has "a chocolate issue".  He apparently just won the gold metal at his assisted care facility for his age group.  He remains very active, does not denies chest pain or shortness of breath.  Current Meds  Medication Sig   amLODipine (NORVASC) 2.5 MG tablet TAKE 1 TABLET AT BEDTIME (Patient taking  differently: Take 2.5 mg by mouth at bedtime.)   Ascorbic Acid (VITAMIN C) 1000 MG tablet Take 1,000 mg by mouth daily.   aspirin 81 MG tablet Take 81 mg by mouth daily.   atenolol (TENORMIN) 25 MG tablet TAKE 1/2 TABLET BY MOUTH EVERY DAY   atorvastatin (LIPITOR) 20 MG tablet TAKE 1 TABLET BY MOUTH EVERY DAY   Cholecalciferol (VITAMIN D3) 2000 units capsule Take 2,000 Units by mouth daily.   Cyanocobalamin (VITAMIN B-12) 1000 MCG SUBL Place under the  tongue daily.   diazepam (VALIUM) 5 MG tablet Take 2.5 mg by mouth at bedtime as needed for anxiety.   fexofenadine (ALLEGRA) 180 MG tablet Take 1 tablet by mouth daily.   fluticasone (FLONASE) 50 MCG/ACT nasal spray Place 1 spray into both nostrils as needed.   HYDROcodone-acetaminophen (NORCO/VICODIN) 5-325 MG tablet Take 1 tablet by mouth every 6 (six) hours as needed.   Magnesium 200 MG TABS Take 200 mg by mouth at bedtime.   nitrofurantoin, macrocrystal-monohydrate, (MACROBID) 100 MG capsule Take 1 capsule (100 mg total) by mouth 2 (two) times daily.   pantoprazole (PROTONIX) 40 MG tablet TAKE 1 TABLET BY MOUTH EVERY DAY   polyethylene glycol (MIRALAX / GLYCOLAX) packet Take 17 g by mouth daily.    Zinc 25 MG TABS Take 25 mg by mouth daily.      Allergies  Allergen Reactions   Ciprofloxacin     Unknown reaction    Social History   Socioeconomic History   Marital status: Married    Spouse name: Not on file   Number of children: 2   Years of education: Not on file   Highest education level: Not on file  Occupational History   Occupation: retired    Fish farm manager: RETIRED  Tobacco Use   Smoking status: Never   Smokeless tobacco: Never  Vaping Use   Vaping Use: Never used  Substance and Sexual Activity   Alcohol use: Yes    Comment: seldem   Drug use: Never   Sexual activity: Yes  Other Topics Concern   Not on file  Social History Narrative   Grew up in [place] town in [place]. 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 Met wife in [place]. At 67, got married and went to college-undergrad in [employer] as well as Oceanographer, doctorate in Location manager.  Worked at [employer] which eventually became [employer]. Lived in [place] [facility] [place] and [place] before eventually moving to [place]. 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       Married over 50 years. 55 years 01/2014. 2 married children with 2 children each. 1 son near [place]. 1 son in [place] with house of  rep.       Hobbies: work out most mornings 5 days a week (spin, elipitical), racquetball until 2014 after hip surgery.    Social Determinants of Health   Financial Resource Strain: Not on file  Food Insecurity: Not on file  Transportation Needs: Not on file  Physical Activity: Not on file  Stress: Not on file  Social Connections: Not on file  Intimate Partner Violence: Not on file     Review of Systems: General: negative for chills, fever, night sweats or weight changes.  Cardiovascular: negative for chest pain, dyspnea on exertion, edema, orthopnea, palpitations, paroxysmal nocturnal dyspnea or shortness of breath Dermatological: negative for rash Respiratory: negative for cough or wheezing Urologic: negative for hematuria Abdominal: negative for nausea, vomiting, diarrhea, bright red  blood per rectum, melena, or hematemesis Neurologic: negative for visual changes, syncope, or dizziness All other systems reviewed and are otherwise negative except as noted above.    Blood pressure (!) 120/50, pulse (!) 51, height _0  (1.676 m), weight 152 lb (68.9 kg).  General appearance: alert and no distress Neck: no adenopathy, no carotid bruit, no JVD, supple, symmetrical, trachea midline, and thyroid not enlarged, symmetric, no tenderness/mass/nodules Lungs: clear to auscultation bilaterally Heart: regular rate and rhythm, S1, S2 normal, no murmur, click, rub or gallop Extremities: extremities normal, atraumatic, no cyanosis or edema Pulses: 2+ and symmetric Skin: Skin color, texture, turgor normal. No rashes or lesions Neurologic: Grossly normal  EKG sinus bradycardia at 51 with occasional PACs.  I personally reviewed this EKG.  ASSESSMENT AND PLAN:   Hyperlipidemia History of dyslipidemia on atorvastatin followed by his PCP.  His most recent lipid profile performed in 2022 revealed total cholesterol 126, LDL 57 and HDL 51.  PAC (premature atrial contraction), symptomatic Long  history of PVCs dating back to graduate school.  He gets his occasionally and they are exacerbated by caffeine which she tries to limit.  Essential hypertension History of essential hypertension blood pressure measured today at 120/50.  He is on amlodipine, and atenolol.  Carotid artery stenosis History of moderate left ICA stenosis by duplex ultrasound performed 01/09/2022.  This will be repeated on an annual basis.     [name] MD FACP,FACC,FAHA, [facility] 03/19/2022 11:37 AM

## 2022-03-19 NOTE — Assessment & Plan Note (Signed)
History of essential hypertension blood pressure measured today at 120/50.  He is on amlodipine, and atenolol.

## 2022-03-19 NOTE — Assessment & Plan Note (Signed)
Long history of PVCs dating back to graduate school.  He gets his occasionally and they are exacerbated by caffeine which she tries to limit.

## 2022-03-19 NOTE — Patient Instructions (Signed)
Medication Instructions:  Your physician recommends that you continue on your current medications as directed. Please refer to the Current Medication list given to you today.  *If you need a refill on your cardiac medications before your next appointment, please call your pharmacy*   Testing/Procedures: Your physician has requested that you have a carotid duplex. This test is an ultrasound of the carotid arteries in your neck. It looks at blood flow through these arteries that supply the brain with blood. Allow one hour for this exam. There are no restrictions or special instructions. To be done in August 2024. This procedure will be done at [place]   Follow-Up: At [facility], you and your health needs are our priority.  As part of our continuing mission to provide you with exceptional heart care, we have created designated Provider Care Teams.  These Care Teams include your primary Cardiologist (physician) and Advanced Practice Providers (APPs -  Physician Assistants and Nurse Practitioners) who all work together to provide you with the care you need, when you need it.  We recommend signing up for the patient portal called "MyChart".  Sign up information is provided on this After Visit Summary.  MyChart is used to connect with patients for Virtual Visits (Telemedicine).  Patients are able to view lab/test results, encounter notes, upcoming appointments, etc.  Non-urgent messages can be sent to your provider as well.   To learn more about what you can do with MyChart, go to [email or web address].    Your next appointment:   12 month(s)  The format for your next appointment:   In Person  Provider:   Quay Burow, MD

## 2022-03-19 NOTE — Assessment & Plan Note (Signed)
History of moderate left ICA stenosis by duplex ultrasound performed 01/09/2022.  This will be repeated on an annual basis.

## 2022-03-19 NOTE — Assessment & Plan Note (Addendum)
History of dyslipidemia on atorvastatin followed by his PCP.  His most recent lipid profile performed in 2022 revealed total cholesterol 126, LDL 57 and HDL 51.

## 2022-04-14 ENCOUNTER — Other Ambulatory Visit: Payer: Self-pay | Admitting: Internal Medicine

## 2022-05-07 ENCOUNTER — Telehealth: Payer: Self-pay | Admitting: Cardiovascular Disease

## 2022-05-07 NOTE — Telephone Encounter (Signed)
*  STAT* If patient is at the pharmacy, call can be transferred to refill team.   1. Which medications need to be refilled? (please list name of each medication and dose if known)   atorvastatin (LIPITOR) 20 MG tablet   2. Which pharmacy/location (including street and city if local pharmacy) is medication to be sent to?  [id number] [place], [place] - [address]  3. Do they need a 30 day or 90 day supply?   90 day  Patient stated he still has some of this medication.  Patient is concerned his prescription was written by Dr. KClaiborne Billingshowever Dr. BGwenlyn Foundis his cardiologist.  Patient would like a call to confirm this prescription.

## 2022-05-08 MED ORDER — ATORVASTATIN CALCIUM 20 MG PO TABS: 20.0000 mg | ORAL_TABLET | Freq: Every day | ORAL | 11 refills | Status: DC

## 2022-07-02 ENCOUNTER — Encounter: Payer: Self-pay | Admitting: Internal Medicine

## 2022-07-02 ENCOUNTER — Ambulatory Visit (INDEPENDENT_AMBULATORY_CARE_PROVIDER_SITE_OTHER): Payer: Medicare Other | Admitting: Internal Medicine

## 2022-07-02 VITALS — BP 110/66 | HR 65 | Ht 67.0 in | Wt 156.0 lb

## 2022-07-02 DIAGNOSIS — K5909 Other constipation: Secondary | ICD-10-CM | POA: Diagnosis not present

## 2022-07-02 DIAGNOSIS — K219 Gastro-esophageal reflux disease without esophagitis: Secondary | ICD-10-CM | POA: Diagnosis not present

## 2022-07-02 DIAGNOSIS — K2289 Other specified disease of esophagus: Secondary | ICD-10-CM

## 2022-07-02 NOTE — Patient Instructions (Signed)
Continue Miralax daily.   Continue Pantoprazole.   Follow up in 1 year.  _______________________________________________________  If your blood pressure at your visit was 140/90 or greater, please contact your primary care physician to follow up on this.  _______________________________________________________  If you are age 87 or older, your body mass index should be between 23-30. Your Body mass index is 24.43 kg/m. If this is out of the aforementioned range listed, please consider follow up with your Primary Care Provider.  If you are age 4 or younger, your body mass index should be between 19-25. Your Body mass index is 24.43 kg/m. If this is out of the aformentioned range listed, please consider follow up with your Primary Care Provider.   ________________________________________________________  The [facility] providers would like to encourage you to use [facility] to communicate with providers for non-urgent requests or questions.  Due to long hold times on the telephone, sending your provider a message by [facility] may be a faster and more efficient way to get a response.  Please allow 48 business hours for a response.  Please remember that this is for non-urgent requests.  _______________________________________________________  Thank you for choosing me and [facility].  Dr. Ulice Dash Pyrtle

## 2022-07-02 NOTE — Progress Notes (Signed)
   Subjective:    Patient ID: Corey Fischer, male    DOB: 1931/09/02, 89 y.o.   MRN: [id number]  HPI Corey Fischer is an 89 year old male with PMH of GERD, small HH, Schatzki's ring with dilation in 2016, hx of colonic polyps, SIBO, duodenal diverticulum, resolved pancreatitic cyst who is here for follow-up.  He is with his wife today and I last saw him in June 2022.    He reports that he has been doing well but he has noticed an air bubble type sensation which can occur after drinking or swallowing.  There is no dysphagia or odynophagia symptom.  Heartburn is very rare while he is on pantoprazole.  He will occasionally take a Tums or Pepcid.  He has not had any weight loss.  No nausea or vomiting.  No belly pain.  Bowel movements occur 2-3 times a day as he is using MiraLAX.  He is also taking a magnesium supplement.  He remains active.  They live at [facility].  He enjoys playing pickle ball.  Review of Systems As per hpi, otherwise negative  Current Medications, Allergies, Past Medical History, Past Surgical History, Family History and Social History were reviewed in [employer] record.     Objective:   Physical Exam BP 110/66   Pulse 65   Ht '5\' 7"'$  (1.702 m)   Wt 156 lb (70.8 kg)   BMI 24.43 kg/m  Gen: awake, alert, NAD HEENT: anicteric  Neck: no masses, no LAD CV: RRR, no mrg Pulm: CTA b/l Abd: soft, NT/ND, +BS throughout Ext: no c/c/e Neuro: nonfocal        Assessment & Plan:   89 year old male with PMH of GERD, small HH, Schatzki's ring with dilation in 2016, hx of colonic polyps, SIBO, duodenal diverticulum, resolved pancreatitic cyst who is here for follow-up.    GERD with history of Schatzki's ring and small hiatal hernia --no alarm symptom.  The "bubbling" type sensation he feels intermittently is likely related to poor esophageal clearance and probably mild presbyesophagus.  He is not having symptoms of alarm including no dysphagia or  odynophagia.  For this reason I do not think we need to do any further testing at this time. -- Continue pantoprazole 40 mg daily  2.  Mild chronic constipation --continue MiraLAX 17 g daily.  I discussed how his magnesium supplement may also be helping with this condition, though he is not specifically using it for constipation.  3.  History of colon polyps --colonoscopy in June 2022; surveillance deferred based on age  Annual follow-up, sooner if needed  30 minutes total spent today including patient facing time, coordination of care, reviewing medical history/procedures/pertinent radiology studies, and documentation of the encounter.

## 2022-07-12 ENCOUNTER — Other Ambulatory Visit: Payer: Self-pay | Admitting: Cardiovascular Disease

## 2022-08-17 ENCOUNTER — Other Ambulatory Visit: Payer: Self-pay | Admitting: Internal Medicine

## 2022-09-23 ENCOUNTER — Telehealth: Payer: Self-pay | Admitting: Cardiovascular Disease

## 2022-09-23 NOTE — Telephone Encounter (Signed)
Pt c/o BP issue: STAT if pt c/o blurred vision, one-sided weakness or slurred speech  1. What are your last 5 BP readings?  09/22/22: 192/66 HR 51 09/23/22:  149/60's  2. Are you having any other symptoms (ex. Dizziness, headache, blurred vision, passed out)? No   3. What is your BP issue? Had hypertension yesterday, also reports swelling in ankles that he noticed yesterday. Does not believe he has gained any weight from the swelling. The NP at [facility] recommended he wear a monitor regarding this. Please advise.

## 2022-09-23 NOTE — Telephone Encounter (Signed)
Patient BP readings as follows:  5/6   149/60 5/5   192/66 No other readings, has not taken in 2024  States BP today was 180/66 15 min later 192/66  Hr 47.  Took the BP because his feet were swollen. Swelling for 2 days.  Pitting edema.  No SOB.  Weight yesterday at 150 Saw NP at facility [facility]) and she states patient should see Dr Allyson Sabal. She states he should wear heart monitor as well.  Placed on  Schedule tomorrow Amlodipine 2.5 mg at bedtime Atenolol 25 mg  1/2 tablet Daily

## 2022-09-24 ENCOUNTER — Ambulatory Visit (INDEPENDENT_AMBULATORY_CARE_PROVIDER_SITE_OTHER): Payer: Medicare Other

## 2022-09-24 ENCOUNTER — Ambulatory Visit: Payer: Medicare Other | Attending: Cardiovascular Disease | Admitting: Cardiovascular Disease

## 2022-09-24 ENCOUNTER — Encounter: Payer: Self-pay | Admitting: Cardiovascular Disease

## 2022-09-24 VITALS — BP 138/88 | HR 52 | Ht 67.0 in | Wt 153.6 lb

## 2022-09-24 DIAGNOSIS — I10 Essential (primary) hypertension: Secondary | ICD-10-CM

## 2022-09-24 DIAGNOSIS — E782 Mixed hyperlipidemia: Secondary | ICD-10-CM

## 2022-09-24 DIAGNOSIS — I6522 Occlusion and stenosis of left carotid artery: Secondary | ICD-10-CM

## 2022-09-24 DIAGNOSIS — R001 Bradycardia, unspecified: Secondary | ICD-10-CM | POA: Diagnosis not present

## 2022-09-24 MED ORDER — LOSARTAN POTASSIUM 25 MG PO TABS: 25.0000 mg | ORAL_TABLET | Freq: Every day | ORAL | 3 refills | Status: DC

## 2022-09-24 NOTE — Assessment & Plan Note (Signed)
History of essential hypertension on amlodipine and low-dose atenolol blood pressure measured today at 138/88.  He has had some elevated blood pressures recently which has been of concern to him.  I am going to wean him off his atenolol over the next 2 weeks and start low-dose losartan.  He will keep a 38 blood pressure log and come back to see a Pharm.D. in 4 weeks to review and make further changes as necessary.

## 2022-09-24 NOTE — Progress Notes (Signed)
09/24/2022 Corey Fischer   1931-12-24  [id number]  Primary Physician Felipa Furnace, NP Primary Cardiologist: [name] Nicholes Calamity, [place]  HPI:  Corey Fischer is a 89 y.o.  thin and fit appearing married Caucasian male father of 2, grandfather to 4 grandchildren who was self-referred for evaluation of symptomatic PVCs.I last saw him in the office 03/19/2022.  He is accompanied by his wife [name] today..  Since I saw him a year ago he has moved into [facility] 09/18/2018 on the third floor which he is enjoying.  This is a retirement community, independent living.   He is retired from working at [employer] 7 years ago. His risk factors are remarkable for treated hypertension and hyperlipidemia. He does not smoke and drinks socially. His family history is remarkable for a mother who had atrial fibrillation and a father who had angina, but no one has had documented ischemic heart disease. He has never had an MI or a stroke and denies chest pain or shortness of breath. His surgical history is remarkable for remote TURP, cholecystectomy, and hernia repair.  He has had PVCs since graduate school and was on propranolol remotely, prescribed by Dr. Glennon Hamilton. Over the last 2 months he has had progressive PVCs which he is more aware of and symptomatic from. He has cut out his caffeine. Lab work performed by Dr. Lovell Sheehan revealed a normal TSH.  He saw [name] registered nurse practitioner back at the end of May 2014  because of palpitations have gotten worse. He was started on Cardizem 30 mg by mouth twice a day and his simvastatin was decreased from 40-20 mg a day. The 2-D echo is essentially normal. He has been checking his blood pressures which are somewhat sporadic but averaging 1:30 to 140 range systolic.   We have stopped his diltiazem which would cause contributing to his Wenkebach and symptoms and changed him to amlodipine. He had no further symptoms. He denies chest pain or  shortness of breath. His lipid profile was excellent on statin therapy last measured 05/07/13.   He saw Ronie Spies in our [employer] office 03/07/14 because of several days of symptom of palpitations. His EKG showed PACs. He was initially begun on metoprolol and later switched to low-dose atenolol resulted in improvement in his symptoms.   Since being seen back in 6 months ago he's remained currently stable. He gets occasional episodes of symptomatic PVCs which are self-limited. He denies chest pain or shortness of breath. He does have moderate bilateral internal carotid artery stenosis which we followed by duplex ultrasound. He has been seen in the emergency room several times for abdominal pain of unclear etiology. He did have an abdominal pelvic CTA which parenthetically showed severe aortoiliac atherosclerotic changes although he has 2+ pedal pulses and denies claudication.   Since I saw him 6 months ago he has remained stable.  He has noted some spikes in his blood pressure for unclear reasons as well as some mild lower extremity edema.  He also has noted bradycardia on his Apple Watch in the 40s.  He is on low-dose atenolol which I think is for symptomatic PVCs.  He denies chest pain or shortness of breath.  He still remains fairly active.   Current Meds  Medication Sig   amLODipine (NORVASC) 2.5 MG tablet TAKE 1 TABLET AT BEDTIME (Patient taking differently: Take 2.5 mg by mouth at bedtime.)   Ascorbic Acid (VITAMIN C) 1000  MG tablet Take 1,000 mg by mouth daily.   aspirin 81 MG tablet Take 81 mg by mouth daily.   atorvastatin (LIPITOR) 20 MG tablet Take 1 tablet (20 mg total) by mouth daily.   Cholecalciferol (VITAMIN D3) 2000 units capsule Take 2,000 Units by mouth daily.   Cyanocobalamin (VITAMIN B-12) 1000 MCG SUBL Place under the tongue daily.   etodolac (LODINE) 200 MG capsule Take 200 mg by mouth 2 (two) times daily.   fluticasone (FLONASE) 50 MCG/ACT nasal spray Place 1 spray into  both nostrils as needed.   gabapentin (NEURONTIN) 100 MG capsule Take 1 capsule by mouth at bedtime.   Magnesium 200 MG TABS Take 200 mg by mouth at bedtime.   pantoprazole (PROTONIX) 40 MG tablet TAKE 1 TABLET BY MOUTH EVERY DAY   polyethylene glycol (MIRALAX / GLYCOLAX) packet Take 17 g by mouth daily.    Zinc 25 MG TABS Take 25 mg by mouth daily.    [DISCONTINUED] atenolol (TENORMIN) 25 MG tablet TAKE 1/2 TABLET BY MOUTH EVERY DAY     Allergies  Allergen Reactions   Ciprofloxacin     Unknown reaction    Social History   Socioeconomic History   Marital status: Married    Spouse name: Not on file   Number of children: 2   Years of education: Not on file   Highest education level: Not on file  Occupational History   Occupation: retired    Associate Professor: RETIRED  Tobacco Use   Smoking status: Never   Smokeless tobacco: Never  Vaping Use   Vaping Use: Never used  Substance and Sexual Activity   Alcohol use: Yes    Comment: seldem   Drug use: Never   Sexual activity: Yes  Other Topics Concern   Not on file  Social History Narrative   Grew up in [place] town in [place]. 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 Met wife in [place]. At 83, got married and went to college-undergrad in [employer] as well as Scientist, water quality, doctorate in Architect.  Worked at [employer] which eventually became [employer]. Lived in [place] and [place] before eventually moving to [place]. 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       Married over 50 years. 54 years 01/2014. 2 married children with 2 children each. 1 son near [place]. 1 son in [place] with house of rep.       Hobbies: work out most mornings 5 days a week (spin, elipitical), racquetball until 2014 after hip surgery.    Social Determinants of Health   Financial Resource Strain: Not on file  Food Insecurity: Not on file  Transportation Needs: Not on file  Physical Activity: Not on file  Stress: Not on file   Social Connections: Not on file  Intimate Partner Violence: Not on file     Review of Systems: General: negative for chills, fever, night sweats or weight changes.  Cardiovascular: negative for chest pain, dyspnea on exertion, edema, orthopnea, palpitations, paroxysmal nocturnal dyspnea or shortness of breath Dermatological: negative for rash Respiratory: negative for cough or wheezing Urologic: negative for hematuria Abdominal: negative for nausea, vomiting, diarrhea, bright red blood per rectum, melena, or hematemesis Neurologic: negative for visual changes, syncope, or dizziness All other systems reviewed and are otherwise negative except as noted above.    Blood pressure 138/88, pulse (!) 52, height 5\' 7"  (1.702 m), weight 153 lb 9.6 oz (69.7 kg), SpO2 96 %.  General appearance: alert and no distress Neck: no adenopathy, no carotid bruit, no JVD, supple, symmetrical, trachea midline, and thyroid not enlarged, symmetric, no tenderness/mass/nodules Lungs: clear to auscultation bilaterally Heart: regular rate and rhythm, S1, S2 normal, no murmur, click, rub or gallop Extremities: extremities normal, atraumatic, no cyanosis or edema Pulses: 2+ and symmetric Skin: Skin color, texture, turgor normal. No rashes or lesions Neurologic: Grossly normal  EKG sinus bradycardia at 52 with left anterior fascicular block and minimal voltage for LVH.  Personally reviewed this EKG.  ASSESSMENT AND PLAN:   Hyperlipidemia History of hyperlipidemia on atorvastatin with lipid profile performed 02/02/2020 Total cholesterol 133, LDL 62 and HDL 50.  Will recheck a fasting lipid liver profile.  Bradycardia History of bradycardia with heart rates in the 40s.  He is on atenolol 12.5 mg a day which I am going to wean off over the next 2 to 3 weeks and then check a 2-week Zio patch to follow-up.  He is somewhat lethargic.  Essential hypertension History of essential hypertension on amlodipine and  low-dose atenolol blood pressure measured today at 138/88.  He has had some elevated blood pressures recently which has been of concern to him.  I am going to wean him off his atenolol over the next 2 weeks and start low-dose losartan.  He will keep a 38 blood pressure log and come back to see a Pharm.D. in 4 weeks to review and make further changes as necessary.  Carotid artery stenosis History of carotid artery disease with moderate left ICA stenosis by duplex ultrasound 01/09/2022.  This will be repeated this coming August.     [name] [facility], [facility] 09/24/2022 11:25 AM

## 2022-09-24 NOTE — Patient Instructions (Addendum)
Medication Instructions:  Your physician has recommended you make the following change in your medication:   -Wean off atenolol (tenormin). Take 12.5mg  (1/2 tablet) every other day for 1 week. Then every 3rd day for 1 week. Then you may stop.  -Start losartan (cozaar) 25mg  once daily.  *If you need a refill on your cardiac medications before your next appointment, please call your pharmacy*   Lab Work: Your physician recommends that you return for lab work in: 7-10 days BMET   If you have labs (blood work) drawn today and your tests are completely normal, you will receive your results only by: MyChart Message (if you have MyChart) OR A paper copy in the mail If you have any lab test that is abnormal or we need to change your treatment, we will call you to review the results.   Testing/Procedures: Your physician has requested that you have an echocardiogram. Echocardiography is a painless test that uses sound waves to create images of your heart. It provides your doctor with information about the size and shape of your heart and how well your heart's chambers and valves are working. This procedure takes approximately one hour. There are no restrictions for this procedure. Please do NOT wear cologne, perfume, aftershave, or lotions (deodorant is allowed). Please arrive 15 minutes prior to your appointment time. This will take place at [address]   Your physician has requested that you have a carotid duplex. This test is an ultrasound of the carotid arteries in your neck. It looks at blood flow through these arteries that supply the brain with blood. Allow one hour for this exam. There are no restrictions or special instructions. This will take place at [address], [address]. To be done in August.     Follow-Up: At [facility], you and your health needs are our priority.  As part of our continuing mission to provide you with exceptional heart care, we have  created designated Provider Care Teams.  These Care Teams include your primary Cardiologist (physician) and Advanced Practice Providers (APPs -  Physician Assistants and Nurse Practitioners) who all work together to provide you with the care you need, when you need it.  We recommend signing up for the patient portal called "MyChart".  Sign up information is provided on this After Visit Summary.  MyChart is used to connect with patients for Virtual Visits (Telemedicine).  Patients are able to view lab/test results, encounter notes, upcoming appointments, etc.  Non-urgent messages can be sent to your provider as well.   To learn more about what you can do with MyChart, go to [email or web address].    Your next appointment:   3 month(s)  Provider:   Nanetta Batty, MD   Other Instructions Dr. Allyson Sabal has requested that you schedule an appointment with one of our clinical pharmacists for a blood pressure check appointment within the next 4 weeks.  If you monitor your blood pressure (BP) at home, please bring your BP cuff and your BP readings with you to this appointment  HOW TO TAKE YOUR BLOOD PRESSURE: Rest 5 minutes before taking your blood pressure. Don't smoke or drink caffeinated beverages for at least 30 minutes before. Take your blood pressure before (not after) you eat. Sit comfortably with your back supported and both feet on the floor (don't cross your legs). Elevate your arm to heart level on a table or a desk. Use the proper sized cuff. It should fit smoothly and  snugly around your bare upper arm. There should be enough room to slip a fingertip under the cuff. The bottom edge of the cuff should be 1 inch above the crease of the elbow. Ideally, take 3 measurements at one sitting and record the average.   ZIO XT- Long Term Monitor Instructions  Your physician has requested you wear a ZIO patch monitor for 14 days.  This is a single patch monitor. [employer] supplies one patch monitor  per enrollment. Additional stickers are not available. Please do not apply patch if you will be having a Nuclear Stress Test,  Echocardiogram, Cardiac CT, MRI, or Chest Xray during the period you would be wearing the  monitor. The patch cannot be worn during these tests. You cannot remove and re-apply the  ZIO XT patch monitor.  Your ZIO patch monitor will be mailed 3 day USPS to your address on file. It may take 3-5 days  to receive your monitor after you have been enrolled.  Once you have received your monitor, please review the enclosed instructions. Your monitor  has already been registered assigning a specific monitor serial # to you.  Billing and Patient Assistance Program Information  We have supplied [employer] with any of your insurance information on file for billing purposes. [employer] offers a sliding scale Patient Assistance Program for patients that do not have  insurance, or whose insurance does not completely cover the cost of the ZIO monitor.  You must apply for the Patient Assistance Program to qualify for this discounted rate.  To apply, please call [employer] at [phone], select option 4, select option 2, ask to apply for  Patient Assistance Program. Meredeth Ide will ask your household income, and how many people  are in your household. They will quote your out-of-pocket cost based on that information.  [employer] will also be able to set up a 46-month, interest-free payment plan if needed.  Applying the monitor   Shave hair from upper left chest.  Hold abrader disc by orange tab. Rub abrader in 40 strokes over the upper left chest as  indicated in your monitor instructions.  Clean area with 4 enclosed alcohol pads. Let dry.  Apply patch as indicated in monitor instructions. Patch will be placed under collarbone on left  side of chest with arrow pointing upward.  Rub patch adhesive wings for 2 minutes. Remove white label marked "1". Remove the white  label marked "2". Rub patch  adhesive wings for 2 additional minutes.  While looking in a mirror, press and release button in center of patch. A small green light will  flash 3-4 times. This will be your only indicator that the monitor has been turned on.  Do not shower for the first 24 hours. You may shower after the first 24 hours.  Press the button if you feel a symptom. You will hear a small click. Record Date, Time and  Symptom in the Patient Logbook.  When you are ready to remove the patch, follow instructions on the last 2 pages of Patient  Logbook. Stick patch monitor onto the last page of Patient Logbook.  Place Patient Logbook in the blue and white box. Use locking tab on box and tape box closed  securely. The blue and white box has prepaid postage on it. Please place it in the mailbox as  soon as possible. Your physician should have your test results approximately 7 days after the  monitor has been mailed back to [facility].  Call [employer]  Care at [phone] if you have questions regarding  your ZIO XT patch monitor. Call them immediately if you see an orange light blinking on your  monitor.  If your monitor falls off in less than 4 days, contact our Monitor department at [phone].  If your monitor becomes loose or falls off after 4 days call [employer] at [phone] for  suggestions on securing your monitor

## 2022-09-24 NOTE — Assessment & Plan Note (Signed)
History of hyperlipidemia on atorvastatin with lipid profile performed 02/02/2020 Total cholesterol 133, LDL 62 and HDL 50.  Will recheck a fasting lipid liver profile.

## 2022-09-24 NOTE — Assessment & Plan Note (Signed)
History of carotid artery disease with moderate left ICA stenosis by duplex ultrasound 01/09/2022.  This will be repeated this coming August.

## 2022-09-24 NOTE — Progress Notes (Unsigned)
Enrolled for [employer] to mail a ZIO XT long term holter monitor to the patients address on file.  

## 2022-09-24 NOTE — Assessment & Plan Note (Signed)
History of bradycardia with heart rates in the 40s.  He is on atenolol 12.5 mg a day which I am going to wean off over the next 2 to 3 weeks and then check a 2-week Zio patch to follow-up.  He is somewhat lethargic.

## 2022-09-25 ENCOUNTER — Encounter: Payer: Self-pay | Admitting: Cardiovascular Disease

## 2022-10-04 LAB — BASIC METABOLIC PANEL
BUN/Creatinine Ratio: 17 (ref 10–24)
BUN: 18 mg/dL (ref 8–27)
CO2: 24 mmol/L (ref 20–29)
Calcium: 9.4 mg/dL (ref 8.6–10.2)
Chloride: 105 mmol/L (ref 96–106)
Creatinine, Ser: 1.07 mg/dL (ref 0.76–1.27)
Glucose: 90 mg/dL (ref 70–99)
Potassium: 4.9 mmol/L (ref 3.5–5.2)
Sodium: 141 mmol/L (ref 134–144)
eGFR: 67 mL/min/{1.73_m2} (ref >59)

## 2022-10-09 DIAGNOSIS — I10 Essential (primary) hypertension: Secondary | ICD-10-CM

## 2022-10-09 DIAGNOSIS — R001 Bradycardia, unspecified: Secondary | ICD-10-CM | POA: Diagnosis not present

## 2022-10-09 DIAGNOSIS — I6522 Occlusion and stenosis of left carotid artery: Secondary | ICD-10-CM

## 2022-10-09 DIAGNOSIS — E782 Mixed hyperlipidemia: Secondary | ICD-10-CM | POA: Diagnosis not present

## 2022-10-22 ENCOUNTER — Ambulatory Visit (HOSPITAL_COMMUNITY): Payer: Medicare Other | Attending: Cardiovascular Disease

## 2022-10-22 DIAGNOSIS — I10 Essential (primary) hypertension: Secondary | ICD-10-CM | POA: Diagnosis present

## 2022-10-22 DIAGNOSIS — I6522 Occlusion and stenosis of left carotid artery: Secondary | ICD-10-CM | POA: Diagnosis present

## 2022-10-22 DIAGNOSIS — R001 Bradycardia, unspecified: Secondary | ICD-10-CM | POA: Diagnosis present

## 2022-10-22 DIAGNOSIS — E782 Mixed hyperlipidemia: Secondary | ICD-10-CM | POA: Diagnosis present

## 2022-10-22 LAB — ECHOCARDIOGRAM COMPLETE
AR max vel: 2.77 cm2
AV Area VTI: 2.66 cm2
AV Area mean vel: 2.65 cm2
AV Mean grad: 6.5 mmHg
AV Peak grad: 12 mmHg
Ao pk vel: 1.73 m/s
Area-P 1/2: 2.13 cm2
P 1/2 time: 457 msec
S' Lateral: 2.9 cm

## 2022-10-30 ENCOUNTER — Ambulatory Visit: Payer: Medicare Other | Attending: Cardiovascular Disease | Admitting: Pharmacist

## 2022-10-30 ENCOUNTER — Encounter: Payer: Self-pay | Admitting: Pharmacist

## 2022-10-30 VITALS — BP 136/62 | HR 61 | Wt 150.6 lb

## 2022-10-30 DIAGNOSIS — I10 Essential (primary) hypertension: Secondary | ICD-10-CM | POA: Diagnosis present

## 2022-10-30 DIAGNOSIS — H903 Sensorineural hearing loss, bilateral: Secondary | ICD-10-CM | POA: Insufficient documentation

## 2022-10-30 NOTE — Progress Notes (Signed)
Patient ID: Corey Fischer                 DOB: November 23, 1931                      MRN: [id number]     HPI: Corey Fischer is a 89 y.o. male referred by Dr. Allyson Sabal to HTN clinic. PMH is significant for PAC, HTN, carotid artery stenosis, aortic stenosis and bradycardia.   At last visit with Dr Allyson Sabal patient was weaned off atenolol and started on losartan 25mg . Patient reports tolerating well. Awaiting results of 14 day monitor due to concerns of arrhythmias. Remains on amlodipine 2.5mg  without adverse effects.  Presents today with wife. Brought BP log and 2 blood pressure cuffs. Has an older Omron BP cuff and purchased a new one as well.  Home readings: 6/12: 125/62 6/11: 133/66 6/10: 125/64 6/9: 133/67 6/8: 120/63 6/7: 117/61  Home omron cuff in room: 128/65  Home older omron: 129/58  Exercises 3 days a week in group classes at [employer].  Current HTN meds:  Amlodipine 2.5mg  daily Losartan 25mg  daily  Previously tried:  Atenolol Diltiazem  BP goal: <130/80  Wt Readings from Last 3 Encounters:  09/24/22 153 lb 9.6 oz (69.7 kg)  07/02/22 156 lb (70.8 kg)  03/19/22 152 lb (68.9 kg)   BP Readings from Last 3 Encounters:  09/24/22 138/88  07/02/22 110/66  03/19/22 (!) 120/50   Pulse Readings from Last 3 Encounters:  09/24/22 (!) 52  07/02/22 65  03/19/22 (!) 51    Renal function: CrCl cannot be calculated (Patient's most recent lab result is older than the maximum 21 days allowed.).  Past Medical History:  Diagnosis Date   Anxiety    Aortic atherosclerosis (HCC)    Asymptomatic bilateral carotid artery stenosis    followed by cardiology-- Last duplex in epic 01-09-2021 bilateral  ICA 1-39%,  right  ECA >50%.   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 Su Monks (pulm @ [facility] in [place])   Osteoarthritis    PAC (premature atrial contraction)    followed by cardiology, dr berry,  pt symptomatic with palpitations and has PVCs   Raynaud's syndrome    Right nephrolithiasis    per pt told nonobstructive   Schatzki's ring    s/p  balloon dilatation by dr Margretta Sidle 11/ 2016   Sinus bradycardia    Symptomatic PVCs    a. Per report, monitor 05/2012 - NSR with symptomatic PVC. b. Echo 05/2012- EF 55-60%, aortic sclerosis without stenosis. c. Nuc 12/2012 - normal.   Wandering (atrial) pacemaker    Wears hearing aid in both ears     Current Outpatient Medications on File Prior to Visit  Medication Sig Dispense Refill  amLODipine (NORVASC) 2.5 MG tablet TAKE 1 TABLET AT BEDTIME (Patient taking differently: Take 2.5 mg by mouth at bedtime.) 90 tablet 3   Ascorbic Acid (VITAMIN C) 1000 MG tablet Take 1,000 mg by mouth daily.     aspirin 81 MG tablet Take 81 mg by mouth daily.     atorvastatin (LIPITOR) 20 MG tablet Take 1 tablet (20 mg total) by mouth daily. 30 tablet 11   Cholecalciferol (VITAMIN D3) 2000 units capsule Take 2,000 Units by mouth daily.     Cyanocobalamin (VITAMIN B-12) 1000 MCG SUBL Place under the tongue daily.     etodolac (LODINE) 200 MG capsule Take 200 mg by mouth 2 (two) times daily.     fluticasone (FLONASE) 50 MCG/ACT nasal spray Place 1 spray into both nostrils as needed.     gabapentin (NEURONTIN) 100 MG capsule Take 1 capsule by mouth at bedtime.     losartan (COZAAR) 25 MG tablet Take 1 tablet (25 mg total) by mouth daily. 90 tablet 3   Magnesium 200 MG TABS Take 200 mg by mouth at bedtime.     pantoprazole  (PROTONIX) 40 MG tablet TAKE 1 TABLET BY MOUTH EVERY DAY 90 tablet 2   polyethylene glycol (MIRALAX / GLYCOLAX) packet Take 17 g by mouth daily.      Zinc 25 MG TABS Take 25 mg by mouth daily.      No current facility-administered medications on file prior to visit.    Allergies  Allergen Reactions   Ciprofloxacin     Unknown reaction     Assessment/Plan:  1. Hypertension -  Patient BP in room 134/62 which is slightly above goal of <130/80 however home readings are mostly at goal and patient is tolerating losartan well. Recommended continuing to monitor at home as BP cuffs appear accurate and if systolic pressure begins to trend above 130 to contact office and we can consider losartan increase. Patient voiced understanding. No changes needed at this time.  Continue losartan 25mg  daily Continue amlodipine 2.5mg  daily Recheck as needed  Laural Golden, PharmD, BCACP, CDCES, CPP [address] [place], [place], [address] Phone: [phone], Fax: [phone]

## 2022-10-30 NOTE — Patient Instructions (Signed)
It was nice meeting you two today  We would like your blood pressure to stay less than 130/80  Slightly higher in the office today but your home readings look great  Continue to monitor your blood pressure at home a few times a week and contact us if it begins to trend upwards   Laural Golden, PharmD, BCACP, CDCES, CPP [address] [place], [place], [address] Phone: [phone], Fax: [phone]

## 2022-11-07 ENCOUNTER — Encounter: Payer: Self-pay | Admitting: Cardiovascular Disease

## 2022-11-07 ENCOUNTER — Ambulatory Visit: Payer: Medicare Other | Attending: Cardiovascular Disease | Admitting: Cardiovascular Disease

## 2022-11-07 VITALS — BP 132/56 | HR 64 | Ht 67.0 in | Wt 152.4 lb

## 2022-11-07 DIAGNOSIS — R002 Palpitations: Secondary | ICD-10-CM | POA: Diagnosis present

## 2022-11-07 NOTE — Patient Instructions (Signed)
Medication Instructions:  Your physician recommends that you continue on your current medications as directed. Please refer to the Current Medication list given to you today.  *If you need a refill on your cardiac medications before your next appointment, please call your pharmacy*   Lab Work: NONE If you have labs (blood work) drawn today and your tests are completely normal, you will receive your results only by: MyChart Message (if you have MyChart) OR A paper copy in the mail If you have any lab test that is abnormal or we need to change your treatment, we will call you to review the results.   Testing/Procedures: NONE   Follow-Up: At [facility], you and your health needs are our priority.  As part of our continuing mission to provide you with exceptional heart care, we have created designated Provider Care Teams.  These Care Teams include your primary Cardiologist (physician) and Advanced Practice Providers (APPs -  Physician Assistants and Nurse Practitioners) who all work together to provide you with the care you need, when you need it.  We recommend signing up for the patient portal called "MyChart".  Sign up information is provided on this After Visit Summary.  MyChart is used to connect with patients for Virtual Visits (Telemedicine).  Patients are able to view lab/test results, encounter notes, upcoming appointments, etc.  Non-urgent messages can be sent to your provider as well.   To learn more about what you can do with MyChart, go to [email or web address].    Your next appointment:   6 month(s)  Provider:   DR. Allyson Sabal

## 2022-11-07 NOTE — Progress Notes (Signed)
Corey Fischer returns today for outpatient follow-up of his noninvasive studies.  I last saw him in the office 09/24/2022.  He is accompanied by his wife Corey Fischer today.  He was complaining of some mild ankle edema when I saw him last.  He had a 2D echo performed 10/22/2022 that revealed normal LV systolic function, grade 1 diastolic dysfunction with mild elevation in his right ventricular systolic pressure.  He had a 2-week Zio patch that showed an average heart rate of 58 with frequent PACs (9% burden) and occasional Wenke block).  He really does not notice any arrhythmias.  He has seen Gertie Baron , Pharm.D., for blood pressure which today is 132/56.  No changes in his current treatment.  I reassured him about the arrhythmias.  I will see him back in 6 months.  Runell Gess, M.D., FACP, [facility], [facility] [address] [place], [place]  [address]  [phone] 11/07/2022 1:49 PM

## 2023-01-06 ENCOUNTER — Encounter (INDEPENDENT_AMBULATORY_CARE_PROVIDER_SITE_OTHER): Payer: Medicare Other | Admitting: Ophthalmology

## 2023-01-07 ENCOUNTER — Encounter (INDEPENDENT_AMBULATORY_CARE_PROVIDER_SITE_OTHER): Payer: Medicare Other | Admitting: Ophthalmology

## 2023-01-08 ENCOUNTER — Other Ambulatory Visit: Payer: Self-pay | Admitting: Cardiovascular Disease

## 2023-01-08 DIAGNOSIS — I491 Atrial premature depolarization: Secondary | ICD-10-CM

## 2023-01-08 DIAGNOSIS — I6522 Occlusion and stenosis of left carotid artery: Secondary | ICD-10-CM

## 2023-01-08 DIAGNOSIS — E782 Mixed hyperlipidemia: Secondary | ICD-10-CM

## 2023-01-08 DIAGNOSIS — I10 Essential (primary) hypertension: Secondary | ICD-10-CM

## 2023-01-13 ENCOUNTER — Ambulatory Visit (HOSPITAL_COMMUNITY)
Admission: RE | Admit: 2023-01-13 | Discharge: 2023-01-13 | Disposition: A | Discharge status: Home / Self Care | Payer: Medicare Other | Source: Ambulatory Visit | Attending: Cardiology | Admitting: Cardiology

## 2023-01-13 DIAGNOSIS — I6522 Occlusion and stenosis of left carotid artery: Secondary | ICD-10-CM | POA: Diagnosis present

## 2023-01-19 IMAGING — CT CT VIRTUAL COLONOSCOPY DIAGNOSTIC
2 of 9 series · 12 of 46 positions shown, 14 images · non-contrast
Comparison: None

CLINICAL DATA: History of polyps.

EXAM:
CT VIRTUAL COLONOSCOPY DIAGNOSTIC
TECHNIQUE: The patient was given a standard bowel preparation with Gastrografin
and barium for fluid and stool tagging respectively. The quality of
the bowel preparation is moderate. Automated CO2 insufflation of the
colon was performed prior to image acquisition and colonic
distention is good. Image post processing was used to generate a 3D
endoluminal fly-through projection of the colon and to
electronically subtract stool/fluid as appropriate.

[Series 3: supine colon 1.50 br40 s3 supine thins · axial · 0.75mm/px · z∈[+1184,+1587]mm · 9 of 337 slices shown, 11 images]
[im 34/337  soft-tissue]
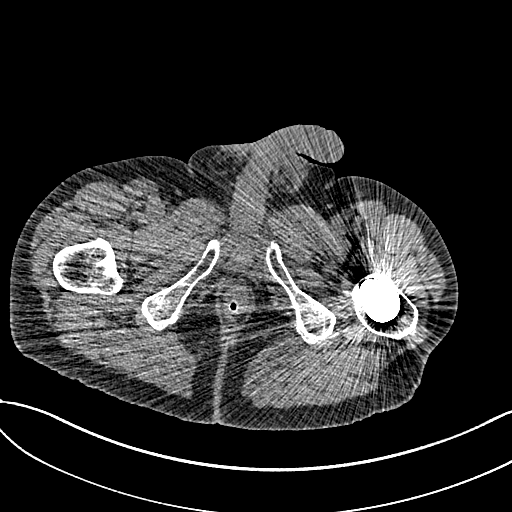
[im 34/337  bone]
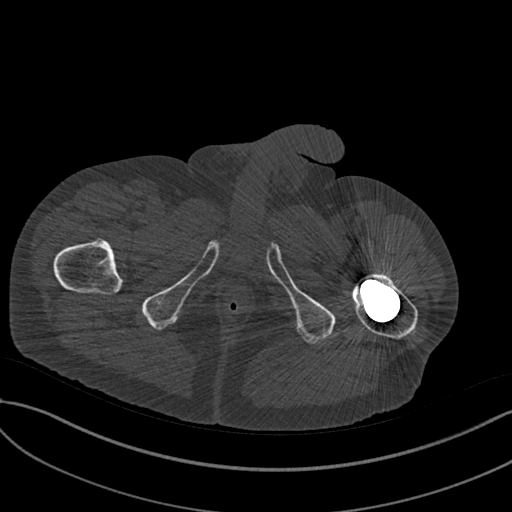
[im 68/337  soft-tissue]
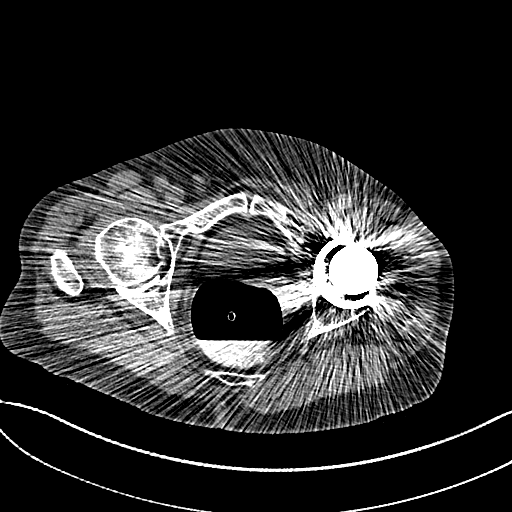
[im 101/337  soft-tissue]
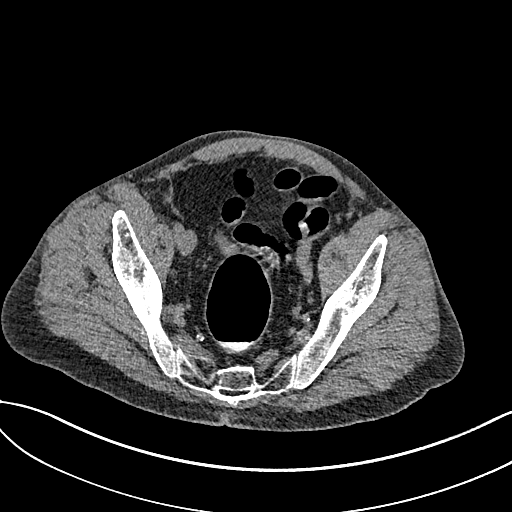
[im 135/337  soft-tissue]
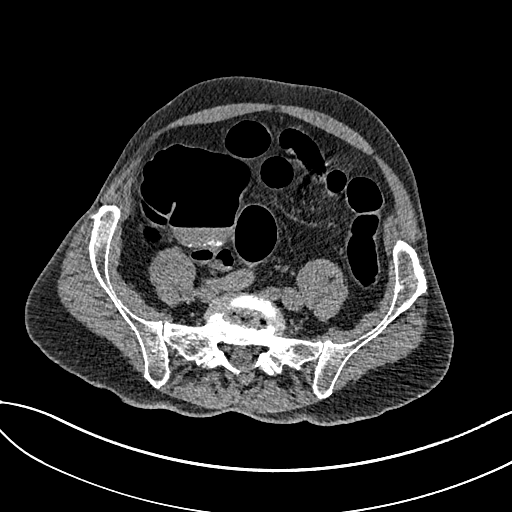
[im 169/337  soft-tissue]
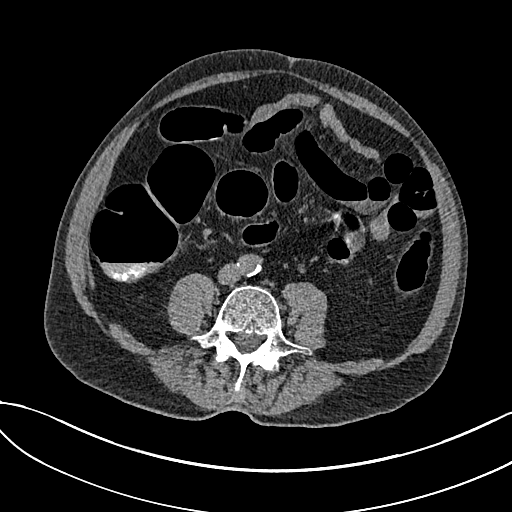
[im 202/337  soft-tissue]
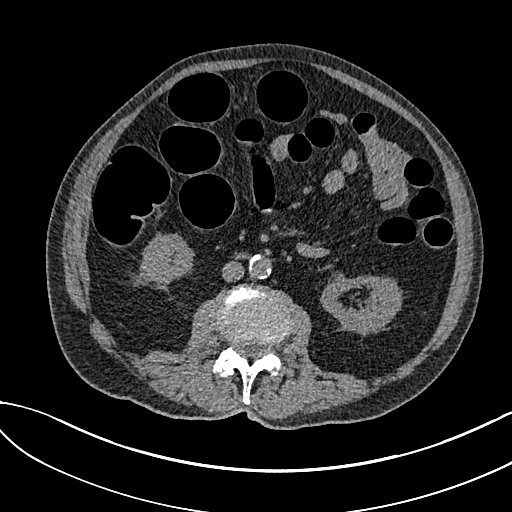
[im 236/337  soft-tissue]
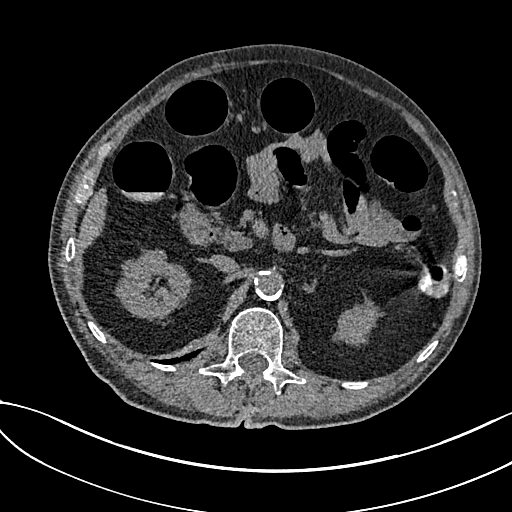
[im 269/337  soft-tissue]
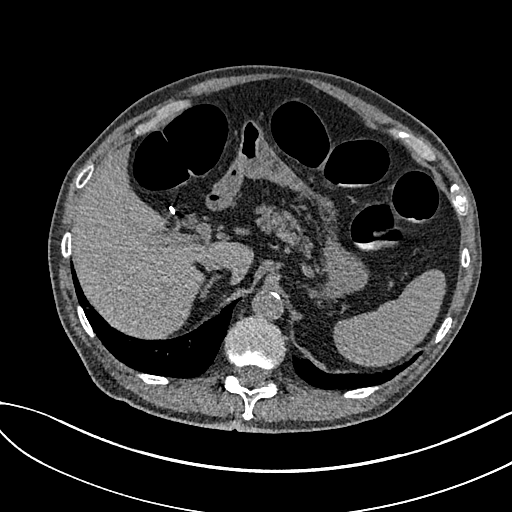
[im 303/337  soft-tissue]
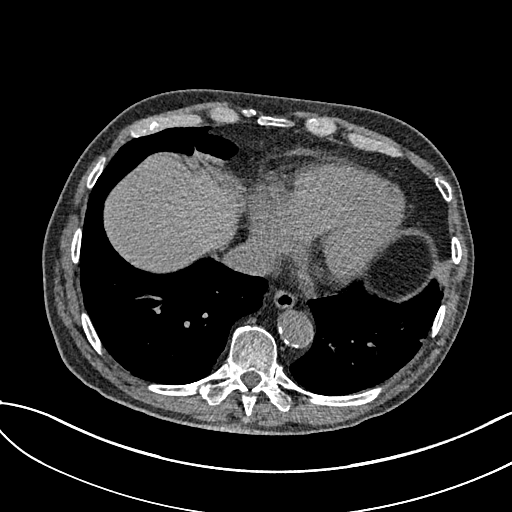
[im 303/337  bone]
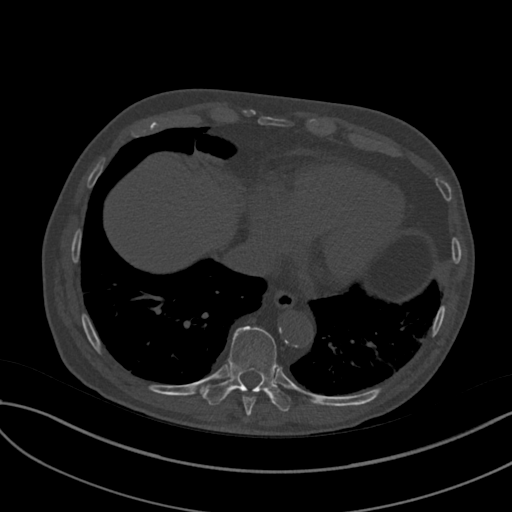

[Series 5: supine colon 3.00 br40 s3 cor supine · coronal · 0.69mm/px · 3 of 119 slices shown]
[im 30/119  soft-tissue]
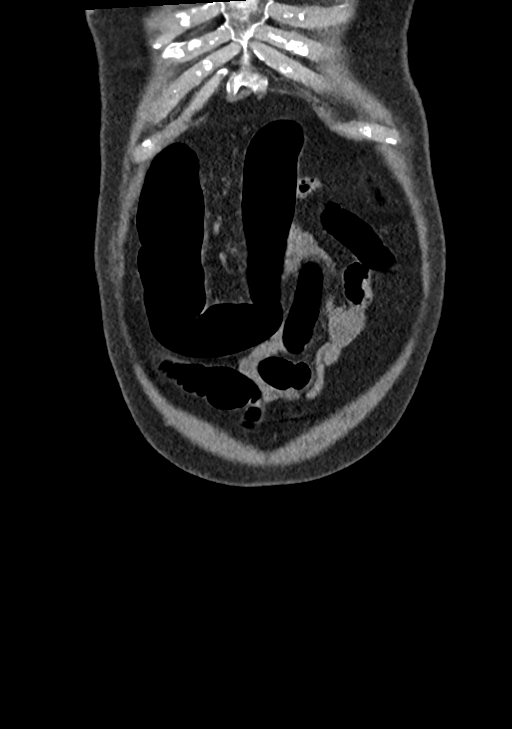
[im 60/119  soft-tissue]
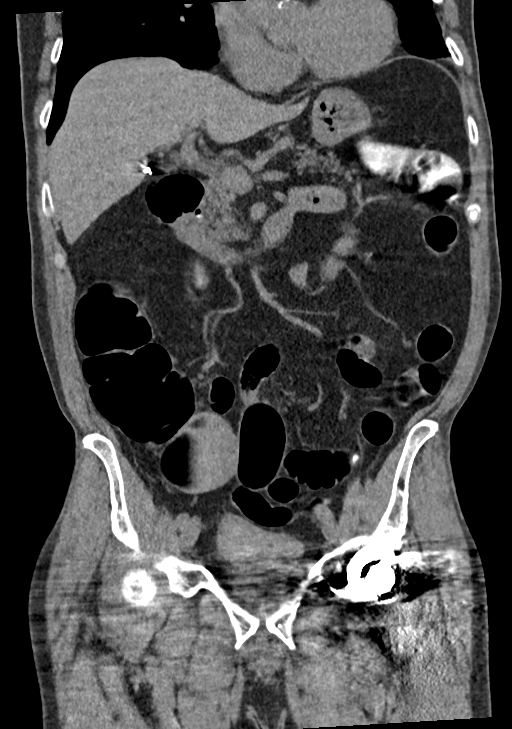
[im 89/119  soft-tissue]
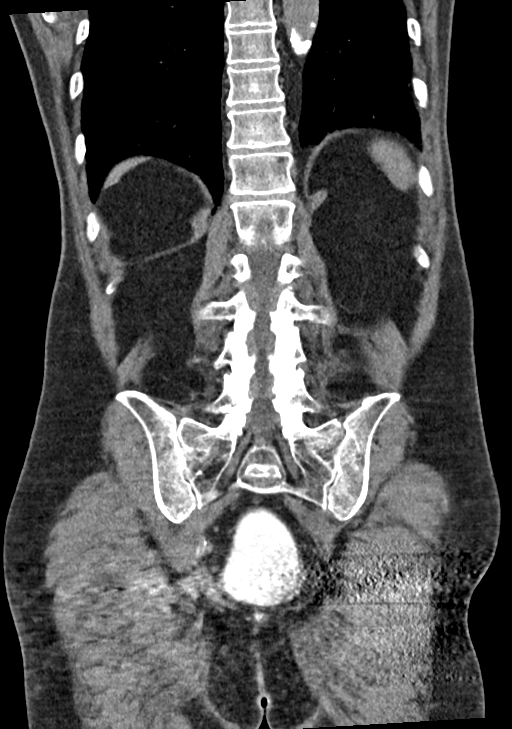

[12 of 46 positions shown; findings below may reference images not displayed]

FINDINGS: VIRTUAL COLONOSCOPY

There is a small persistent polypoid lesion which is non dependent
within the proximal to mid transverse colon measuring 4 mm. No other
polypoid filling defects are seen. No annular constricting lesions.
Moderate retained layering barium throughout the colon. Tortuous
colon.

Virtual colonoscopy is not designed to detect diminutive polyps
(i.e., less than or equal to 5 mm), the presence or absence of which
may not affect clinical management.

CT ABDOMEN AND PELVIS WITHOUT CONTRAST

Lower chest: Scarring in the lung bases. No effusions. Coronary
artery and aortic calcifications.

Hepatobiliary: Prior cholecystectomy.  No focal hepatic abnormality.

Pancreas: No pancreatic lesion or ductal dilatation.

Spleen: Spleen is normal size.  No focal abnormality.

Adrenals/Urinary Tract: Adrenal glands normal. Punctate 1-2 mm
nonobstructing stone in the midpole of the right kidney. No stones
on the left. No hydronephrosis. Urinary bladder decompressed,
grossly unremarkable.

Stomach/Bowel: Stomach and small bowel decompressed, grossly
unremarkable.

Vascular/Lymphatic: Diffuse aortic atherosclerosis. No aneurysm or
adenopathy.

Reproductive: Prostate obscured by beam hardening artifact from the
left hip replacement.

Other: No free fluid or free air.

Musculoskeletal: No acute bony abnormality.
IMPRESSION: Small persistent non dependent 4 mm polypoid filling defect within
the proximal to mid transverse colon.

Diffuse aortic atherosclerosis.  No aneurysm.

Right nephrolithiasis.  No hydronephrosis.

## 2023-02-05 ENCOUNTER — Other Ambulatory Visit (HOSPITAL_COMMUNITY): Payer: Self-pay | Admitting: Cardiovascular Disease

## 2023-02-05 DIAGNOSIS — I6523 Occlusion and stenosis of bilateral carotid arteries: Secondary | ICD-10-CM

## 2023-03-04 ENCOUNTER — Ambulatory Visit: Payer: Medicare Other | Admitting: Cardiovascular Disease

## 2023-04-25 ENCOUNTER — Telehealth: Payer: Self-pay

## 2023-04-25 NOTE — Telephone Encounter (Signed)
Pts wife called and reports pt needs to be seen for nausea and stomach discomfort. Pt scheduled to see Dr. Rhea Belton 06/10/23 at 9:30am. Pts wife aware of appt.

## 2023-05-05 ENCOUNTER — Ambulatory Visit: Payer: Medicare Other | Admitting: Cardiovascular Disease

## 2023-05-11 ENCOUNTER — Encounter (HOSPITAL_BASED_OUTPATIENT_CLINIC_OR_DEPARTMENT_OTHER): Payer: Self-pay

## 2023-05-11 ENCOUNTER — Emergency Department (HOSPITAL_BASED_OUTPATIENT_CLINIC_OR_DEPARTMENT_OTHER)
Admission: EM | Admit: 2023-05-11 | Discharge: 2023-05-11 | Disposition: A | Discharge status: Home / Self Care | Payer: Medicare Other | Attending: Emergency Medicine | Admitting: Emergency Medicine

## 2023-05-11 ENCOUNTER — Other Ambulatory Visit: Payer: Self-pay

## 2023-05-11 ENCOUNTER — Emergency Department (HOSPITAL_BASED_OUTPATIENT_CLINIC_OR_DEPARTMENT_OTHER): Payer: Medicare Other

## 2023-05-11 DIAGNOSIS — S40011A Contusion of right shoulder, initial encounter: Secondary | ICD-10-CM | POA: Diagnosis not present

## 2023-05-11 DIAGNOSIS — S4991XA Unspecified injury of right shoulder and upper arm, initial encounter: Secondary | ICD-10-CM | POA: Diagnosis present

## 2023-05-11 DIAGNOSIS — Z7982 Long term (current) use of aspirin: Secondary | ICD-10-CM | POA: Diagnosis not present

## 2023-05-11 DIAGNOSIS — W19XXXA Unspecified fall, initial encounter: Secondary | ICD-10-CM | POA: Insufficient documentation

## 2023-05-11 NOTE — ED Triage Notes (Signed)
Mechanical fall when making bed last night, tripped on sheet and fell with right shoulder into dresser. C/o right shoulder pain. Denies hitting head.

## 2023-05-11 NOTE — ED Provider Notes (Signed)
[facility] Provider Note   CSN: [id number] Arrival date & time: 05/11/23  1116     History  Chief Complaint  Patient presents with   Shoulder Injury    Corey Fischer is a 89 y.o. male presenting to ED with report of injury to his right shoulder.  The patient reports he lost his footing yesterday while making the bed and fell into a dresser, with handle latch sticking into his right proximal arm near the shoulder.  He says it has been achy since then.  He has full range of motion.  Denies head injuries.  No other significant complaints.  He is here with his wife at bedside.  HPI     Home Medications Prior to Admission medications   Medication Sig Start Date End Date Taking? Authorizing Provider  amLODipine (NORVASC) 2.5 MG tablet TAKE 1 TABLET AT BEDTIME Patient taking differently: Take 2.5 mg by mouth at bedtime. 03/10/18   Shelva Majestic, MD  Ascorbic Acid (VITAMIN C) 1000 MG tablet Take 1,000 mg by mouth daily.    [provider]  aspirin 81 MG tablet Take 81 mg by mouth daily.    [provider]  atorvastatin (LIPITOR) 20 MG tablet Take 1 tablet (20 mg total) by mouth daily. 05/08/22   Runell Gess, MD  Cholecalciferol (VITAMIN D3) 2000 units capsule Take 2,000 Units by mouth daily.    [provider]  Cyanocobalamin (VITAMIN B-12) 1000 MCG SUBL Place under the tongue daily.    [provider]  fluticasone (FLONASE) 50 MCG/ACT nasal spray Place 1 spray into both nostrils as needed. 09/12/20   [provider]  losartan (COZAAR) 25 MG tablet Take 1 tablet (25 mg total) by mouth daily. 09/24/22 12/23/22  Runell Gess, MD  Magnesium 200 MG TABS Take 200 mg by mouth at bedtime.    [provider]  pantoprazole (PROTONIX) 40 MG tablet TAKE 1 TABLET BY MOUTH EVERY DAY 08/19/22   Pyrtle, Carie Caddy, MD  polyethylene glycol (MIRALAX / GLYCOLAX) packet Take 17 g by mouth daily.      [provider]  Zinc 25 MG TABS Take 25 mg by mouth daily.     [provider]      Allergies    Patient has no known allergies.    Review of Systems   Review of Systems  Physical Exam Updated Vital Signs BP (!) 171/55 (BP Location: Right Arm)   Pulse (!) 57   Temp 98.1 F (36.7 C) (Oral)   Resp 18   Ht 5\' 7"  (1.702 m)   Wt 66.7 kg   SpO2 98%   BMI 23.02 kg/m  Physical Exam Constitutional:      General: He is not in acute distress. HENT:     Head: Normocephalic and atraumatic.  Eyes:     Conjunctiva/sclera: Conjunctivae normal.     Pupils: Pupils are equal, round, and reactive to light.  Cardiovascular:     Rate and Rhythm: Normal rate and regular rhythm.  Pulmonary:     Effort: Pulmonary effort is normal. No respiratory distress.  Musculoskeletal:        General: No swelling, tenderness, deformity or signs of injury. Normal range of motion.  Skin:    General: Skin is warm and dry.  Neurological:     General: No focal deficit present.     Mental Status: He is alert and oriented to person, place, and  time. Mental status is at baseline.  Psychiatric:        Mood and Affect: Mood normal.        Behavior: Behavior normal.     ED Results / Procedures / Treatments   Labs (all labs ordered are listed, but only abnormal results are displayed) Labs Reviewed - No data to display  EKG None  Radiology DG Humerus Right Result Date: 05/11/2023 CLINICAL DATA:  fall, arm pain, right shoulder pain EXAM: RIGHT HUMERUS - 2+ VIEW COMPARISON:  None Available. FINDINGS: Surgical clips in the right upper quadrant of the abdomen. No right humerus fracture. No focal osseous lesions. No malalignment at the right shoulder or right elbow on these views. Mild osteoarthritis noted in the right acromioclavicular joint, right glenohumeral joint and anterior right elbow joint. IMPRESSION: No right humerus fracture. 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  Electronically Signed   By: Delbert Phenix M.D.   On: 05/11/2023 11:57   DG Shoulder Right Result Date: 05/11/2023 CLINICAL DATA:  fall onto dresser, right shoulder pain EXAM: RIGHT SHOULDER - 2+ VIEW COMPARISON:  None Available. FINDINGS: No fracture. No glenohumeral dislocation. No evidence of acromioclavicular separation. Mild right glenohumeral joint osteoarthritis. Tiny calcifications at the superior right greater tuberosity. No focal osseous lesions. No radiopaque foreign bodies. IMPRESSION: 1. No fracture or dislocation. 2. Mild right glenohumeral joint osteoarthritis. 3. Tiny calcifications at the superior right greater tuberosity, suggesting calcific tendinosis. Electronically Signed   By: Delbert Phenix M.D.   On: 05/11/2023 11:55    Procedures Procedures    Medications Ordered in ED Medications - No data to display  ED Course/ Medical Decision Making/ A&P                                 Medical Decision Making Amount and/or Complexity of Data Reviewed Radiology: ordered.   Patient is presented to the ED with a mechanical fall and isolated injury of the right shoulder.  He is neurovascular intact.  He has full range of motion of the right shoulder.  X-rays reviewed with no underlying fracture noted.  I have a low suspicion for rotator cuff tear given the patient's clinical exam.  I suspect he may have a contusion of the shoulder, and I recommended conservative measures at home.  But he is not requiring a sling and he is stable for discharge.  No other significant traumatic injuries noted.        Final Clinical Impression(s) / ED Diagnoses Final diagnoses:  Contusion of right shoulder, initial encounter    Rx / DC Orders ED Discharge Orders     None         Terald Sleeper, MD 05/11/23 1213

## 2023-05-12 ENCOUNTER — Ambulatory Visit: Payer: Medicare Other | Attending: Cardiovascular Disease | Admitting: Cardiovascular Disease

## 2023-05-12 ENCOUNTER — Encounter: Payer: Self-pay | Admitting: Cardiovascular Disease

## 2023-05-12 VITALS — BP 140/52 | Resp 55 | Ht 67.0 in | Wt 152.8 lb

## 2023-05-12 DIAGNOSIS — E782 Mixed hyperlipidemia: Secondary | ICD-10-CM | POA: Diagnosis not present

## 2023-05-12 DIAGNOSIS — I10 Essential (primary) hypertension: Secondary | ICD-10-CM | POA: Diagnosis not present

## 2023-05-12 DIAGNOSIS — I6523 Occlusion and stenosis of bilateral carotid arteries: Secondary | ICD-10-CM | POA: Diagnosis not present

## 2023-05-12 DIAGNOSIS — I491 Atrial premature depolarization: Secondary | ICD-10-CM | POA: Diagnosis present

## 2023-05-12 NOTE — Assessment & Plan Note (Signed)
History of hyperlipidemia on statin therapy followed by his PCP 

## 2023-05-12 NOTE — Assessment & Plan Note (Signed)
History of essential hypertension her blood pressure measured today at 140/52.  He measures it at home and is somewhat lower than this.  He is on low-dose amlodipine and losartan.

## 2023-05-12 NOTE — Patient Instructions (Addendum)
Medication Instructions:  Your physician recommends that you continue on your current medications as directed. Please refer to the Current Medication list given to you today.  *If you need a refill on your cardiac medications before your next appointment, please call your pharmacy*    Testing/Procedures: Your physician has requested that you have a carotid duplex. This test is an ultrasound of the carotid arteries in your neck. It looks at blood flow through these arteries that supply the brain with blood. Allow one hour for this exam. There are no restrictions or special instructions. This will take place at [address] [facility], [address]. **To do in August 2025**  Please note: We ask at that you not bring children with you during ultrasound (echo/ vascular) testing. Due to room size and safety concerns, children are not allowed in the ultrasound rooms during exams. Our front office staff cannot provide observation of children in our lobby area while testing is being conducted. An adult accompanying a patient to their appointment will only be allowed in the ultrasound room at the discretion of the ultrasound technician under special circumstances. We apologize for any inconvenience.    Follow-Up: At [facility], you and your health needs are our priority.  As part of our continuing mission to provide you with exceptional heart care, we have created designated Provider Care Teams.  These Care Teams include your primary Cardiologist (physician) and Advanced Practice Providers (APPs -  Physician Assistants and Nurse Practitioners) who all work together to provide you with the care you need, when you need it.  We recommend signing up for the patient portal called "MyChart".  Sign up information is provided on this After Visit Summary.  MyChart is used to connect with patients for Virtual Visits (Telemedicine).  Patients are able to view lab/test results, encounter notes, upcoming appointments,  etc.  Non-urgent messages can be sent to your provider as well.   To learn more about what you can do with MyChart, go to [email or web address].    Your next appointment:   6 month(s)  Provider:   Marjie Skiff, PA-C, Robet Leu, PA-C, Azalee Course, PA-C, Bernadene Person, NP, or Reather Littler, NP        Then, Nanetta Batty, MD will plan to see you again in 12 month(s).

## 2023-05-12 NOTE — Assessment & Plan Note (Signed)
Bilateral ICA stenosis by duplex ultrasound performed 01/13/2023.  He has loud carotid bruits.  This will be repeated on an annual basis.

## 2023-05-12 NOTE — Assessment & Plan Note (Signed)
History of PVCs with 9% burden by Zio patch performed 09/24/2022.  He is asymptomatic from these.

## 2023-05-12 NOTE — Progress Notes (Signed)
05/12/2023 Corey Fischer   02/19/1932  [id number]  Primary Physician Felipa Furnace, NP Primary Cardiologist: [name] Nicholes Calamity, [place]  HPI:  QUANTA HAFER is a 89 y.o.   thin and fit appearing married Caucasian male father of 2, grandfather to 4 grandchildren who was self-referred for evaluation of symptomatic PVCs.I last saw him in the office 10/22/22.  He is accompanied by his wife [name] today who is also a patient of mine.. He  has moved into [facility] 09/18/2018 on the third floor which he is enjoying.  This is a retirement community, independent living.   He is retired from working at [employer] 7 years ago. His risk factors are remarkable for treated hypertension and hyperlipidemia. He does not smoke and drinks socially. His family history is remarkable for a mother who had atrial fibrillation and a father who had angina, but no one has had documented ischemic heart disease. He has never had an MI or a stroke and denies chest pain or shortness of breath. His surgical history is remarkable for remote TURP, cholecystectomy, and hernia repair.  He has had PVCs since graduate school and was on propranolol remotely, prescribed by Dr. Glennon Hamilton. Over the last 2 months he has had progressive PVCs which he is more aware of and symptomatic from. He has cut out his caffeine. Lab work performed by Dr. Lovell Sheehan revealed a normal TSH.  He saw [name] registered nurse practitioner back at the end of May 2014  because of palpitations have gotten worse. He was started on Cardizem 30 mg by mouth twice a day and his simvastatin was decreased from 40-20 mg a day. The 2-D echo is essentially normal. He has been checking his blood pressures which are somewhat sporadic but averaging 1:30 to 140 range systolic.   We have stopped his diltiazem which would cause contributing to his Wenkebach and symptoms and changed him to amlodipine. He had no further symptoms. He denies chest pain or  shortness of breath. His lipid profile was excellent on statin therapy last measured 05/07/13.   He saw Ronie Spies in our [employer] office 03/07/14 because of several days of symptom of palpitations. His EKG showed PACs. He was initially begun on metoprolol and later switched to low-dose atenolol resulted in improvement in his symptoms.   Since being seen back in 6 months ago he's remained currently stable. He gets occasional episodes of symptomatic PVCs which are self-limited. He denies chest pain or shortness of breath. He does have moderate bilateral internal carotid artery stenosis which we followed by duplex ultrasound. He has been seen in the emergency room several times for abdominal pain of unclear etiology. He did have an abdominal pelvic CTA which parenthetically showed severe aortoiliac atherosclerotic changes although he has 2+ pedal pulses and denies claudication.   Since I saw him 6 months ago he has remained stable.  He has noted some spikes in his blood pressure for unclear reasons as well as some mild lower extremity edema.  He also has noted bradycardia on his Apple Watch in the 40s.  He denies chest pain or shortness of breath.  He walks up 3 flights of stairs to his apartment without symptoms.  He does have moderate bilateral ICA stenosis by duplex ultrasound and frequent PACs by event monitoring performed 09/24/2022 with occasional Matilde Haymaker block which she is asymptomatic from.   Current Meds  Medication Sig   amLODipine (NORVASC) 2.5  MG tablet TAKE 1 TABLET AT BEDTIME (Patient taking differently: Take 2.5 mg by mouth at bedtime.)   Ascorbic Acid (VITAMIN C) 1000 MG tablet Take 1,000 mg by mouth daily.   aspirin 81 MG tablet Take 81 mg by mouth daily.   atorvastatin (LIPITOR) 20 MG tablet Take 1 tablet (20 mg total) by mouth daily.   Cholecalciferol (VITAMIN D3) 2000 units capsule Take 2,000 Units by mouth daily.   Cyanocobalamin (VITAMIN B-12) 1000 MCG SUBL Place under the  tongue daily.   fluticasone (FLONASE) 50 MCG/ACT nasal spray Place 1 spray into both nostrils as needed.   Magnesium 200 MG TABS Take 200 mg by mouth at bedtime.   pantoprazole (PROTONIX) 40 MG tablet TAKE 1 TABLET BY MOUTH EVERY DAY   polyethylene glycol (MIRALAX / GLYCOLAX) packet Take 17 g by mouth daily.    Zinc 25 MG TABS Take 25 mg by mouth daily.      No Known Allergies  Social History   Socioeconomic History   Marital status: Married    Spouse name: Not on file   Number of children: 2   Years of education: Not on file   Highest education level: Not on file  Occupational History   Occupation: retired    Associate Professor: RETIRED  Tobacco Use   Smoking status: Never   Smokeless tobacco: Never  Vaping Use   Vaping status: Never Used  Substance and Sexual Activity   Alcohol use: Yes    Comment: seldem   Drug use: Never   Sexual activity: Yes  Other Topics Concern   Not on file  Social History Narrative   Grew up in [place] town in [place]. 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 Met wife in [place]. At 68, got married and went to college-undergrad in [employer] as well as Scientist, water quality, doctorate in Architect.  Worked at [employer] which eventually became [employer]. Lived in [place] and [place] before eventually moving to [place]. 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       Married over 50 years. 54 years 01/2014. 2 married children with 2 children each. 1 son near [place]. 1 son in [place] with house of rep.       Hobbies: work out most mornings 5 days a week (spin, elipitical), racquetball until 2014 after hip surgery.    Social Drivers of Health   Financial Resource Strain: Low Risk  (04/16/2021)   Received from [facility] visits prior to 07/20/2022., [facility] visits prior to 07/20/2022.   Overall Financial Resource Strain (CARDIA)    Difficulty of Paying Living Expenses: Not hard at all  Food  Insecurity: Low Risk  (02/06/2023)   [facility]   Hunger Vital Sign    Worried About Running Out of Food in the Last Year: Never true    Ran Out of Food in the Last Year: Never true  Transportation Needs: No Transportation Needs (02/06/2023)   Received from [facility]    In the past 12 months, has lack of reliable transportation kept you from medical appointments, meetings, work or from getting things needed for daily living? : No  Physical Activity: Sufficiently Active (04/16/2021)   [facility] from [facility] visits prior to 07/20/2022., [facility] visits prior to 07/20/2022.   Exercise Vital Sign    Days of Exercise per Week: 5 days  Minutes of Exercise per Session: 60 min  Stress: No Stress Concern Present (04/16/2021)   Received from [facility] visits prior to 07/20/2022., [facility] visits prior to 07/20/2022.   [employer] of Occupational Health - Occupational Stress Questionnaire    Feeling of Stress : Not at all  Social Connections: Unknown (10/21/2022)   Received from [facility], [facility]   Social Network    Social Network: Not on file  Intimate Partner Violence: Unknown (10/21/2022)   Received from [facility], [facility]   HITS    Physically Hurt: Not on file    Insult or Talk Down To: Not on file    Threaten Physical Harm: Not on file    Scream or Curse: Not on file     Review of Systems: General: negative for chills, fever, night sweats or weight changes.  Cardiovascular: negative for chest pain, dyspnea on exertion, edema, orthopnea, palpitations, paroxysmal nocturnal dyspnea or shortness of breath Dermatological: negative for rash Respiratory: negative for cough or wheezing Urologic: negative for hematuria Abdominal: negative for nausea, vomiting, diarrhea, bright red blood per rectum, melena, or hematemesis Neurologic: negative  for visual changes, syncope, or dizziness All other systems reviewed and are otherwise negative except as noted above.    Blood pressure (!) 140/52, resp. rate (!) 55, height 5\' 7"  (1.702 m), weight 152 lb 12.8 oz (69.3 kg), SpO2 95%.  General appearance: alert and no distress Neck: no adenopathy, no JVD, supple, symmetrical, trachea midline, thyroid not enlarged, symmetric, no tenderness/mass/nodules, and loud bilateral carotid bruits Lungs: clear to auscultation bilaterally Heart: regular rate and rhythm, S1, S2 normal, no murmur, click, rub or gallop Extremities: extremities normal, atraumatic, no cyanosis or edema Pulses: 2+ and symmetric Skin: Skin color, texture, turgor normal. No rashes or lesions Neurologic: Grossly normal  EKG EKG Interpretation Date/Time:  Monday May 12 2023 11:27:58 EST Ventricular Rate:  52 PR Interval:  216 QRS Duration:  104 QT Interval:  412 QTC Calculation: 383 R Axis:   -49  Text Interpretation: Sinus bradycardia with 1st degree A-V block with Premature atrial complexes Left anterior fascicular block Minimal voltage criteria for LVH, may be normal variant ( R in aVL ) When compared with ECG of 20-Mar-2015 17:40, Premature atrial complexes are now Present Confirmed by Nanetta Batty [phone]) on 05/12/2023 11:43:23 AM    ASSESSMENT AND PLAN:   Hyperlipidemia History of hyperlipidemia on statin therapy followed by his PCP  PAC (premature atrial contraction), symptomatic History of PVCs with 9% burden by Zio patch performed 09/24/2022.  He is asymptomatic from these.  Essential hypertension History of essential hypertension her blood pressure measured today at 140/52.  He measures it at home and is somewhat lower than this.  He is on low-dose amlodipine and losartan.  Carotid artery stenosis Bilateral ICA stenosis by duplex ultrasound performed 01/13/2023.  He has loud carotid bruits.  This will be repeated on an annual basis.     [name] FACP,FACC,FAHA, [facility] 05/12/2023 11:53 AM

## 2023-05-15 ENCOUNTER — Other Ambulatory Visit: Payer: Self-pay | Admitting: Cardiovascular Disease

## 2023-05-15 ENCOUNTER — Other Ambulatory Visit: Payer: Self-pay | Admitting: Internal Medicine

## 2023-06-10 ENCOUNTER — Encounter: Payer: Self-pay | Admitting: Internal Medicine

## 2023-06-10 ENCOUNTER — Ambulatory Visit: Payer: Medicare Other | Admitting: Internal Medicine

## 2023-06-10 VITALS — BP 120/70 | Ht 67.0 in | Wt 152.0 lb

## 2023-06-10 DIAGNOSIS — K5909 Other constipation: Secondary | ICD-10-CM | POA: Diagnosis not present

## 2023-06-10 DIAGNOSIS — K219 Gastro-esophageal reflux disease without esophagitis: Secondary | ICD-10-CM | POA: Diagnosis not present

## 2023-06-10 DIAGNOSIS — R1013 Epigastric pain: Secondary | ICD-10-CM

## 2023-06-10 DIAGNOSIS — Z860100 Personal history of colon polyps, unspecified: Secondary | ICD-10-CM | POA: Diagnosis not present

## 2023-06-10 MED ORDER — PANTOPRAZOLE SODIUM 40 MG PO TBEC: 40.0000 mg | DELAYED_RELEASE_TABLET | Freq: Every day | ORAL | 3 refills | Status: AC

## 2023-06-10 NOTE — Patient Instructions (Signed)
We have sent the following medications to your pharmacy for you to pick up at your convenience: pantoprazole.  Continue famotidine over the counter as needed and Miralax daily.    Please follow up with Dr. Rhea Belton in one year.  _______________________________________________________  If your blood pressure at your visit was 140/90 or greater, please contact your primary care physician to follow up on this.  _______________________________________________________  If you are age 17 or older, your body mass index should be between 23-30. Your Body mass index is 23.81 kg/m. If this is out of the aforementioned range listed, please consider follow up with your Primary Care Provider.  If you are age 51 or younger, your body mass index should be between 19-25. Your Body mass index is 23.81 kg/m. If this is out of the aformentioned range listed, please consider follow up with your Primary Care Provider.   ________________________________________________________  The [place] [facility] providers would like to encourage you to use [facility] to communicate with providers for non-urgent requests or questions.  Due to long hold times on the telephone, sending your provider a message by [facility] may be a faster and more efficient way to get a response.  Please allow 48 business hours for a response.  Please remember that this is for non-urgent requests.  _______________________________________________________

## 2023-06-10 NOTE — Progress Notes (Signed)
   Subjective:    Patient ID: Corey Fischer, male    DOB: 01/01/1932, 89 y.o.   MRN: [id number]  HPI Corey Fischer is an 89 year old male with PMH of GERD, small HH, Schatzki's ring with dilation in 2016, hx of colonic polyps, SIBO, duodenal diverticulum, resolved pancreatitic cyst who is here for follow-up. He is with his wife today and I last saw him in Feb 2024.  The patient, with a history of gastroesophageal reflux disease (GERD) and chronic constipation, presents with intermittent nausea, particularly when rolling over in bed, more so to the right side. The nausea is transient and resolves either with time or upon getting up. The patient also reports occasional episodes of gastric burning, for which he takes Tums and, less frequently, famotidine, which he has had for several years. The patient also takes pantoprazole regularly.  The patient has been managing his chronic constipation with daily Miralax, which has been effective. He also takes a low dose of magnesium, which he believes helps with occasional restless leg symptoms and overall health. The patient has not reported any difficulty swallowing, even with large pills such as CoQ10 and magnesium.  The patient has not reported any vomiting, and his bowel habits are regular with the use of Miralax. He has not reported any changes in his bowel habits or any other new or concerning gastrointestinal symptoms.  He remains quite active enjoying pickleball at least 3 days a week.  Review of Systems As per HPI, otherwise negative  Current Medications, Allergies, Past Medical History, Past Surgical History, Family History and Social History were reviewed in [employer] record.     Objective:   Physical Exam BP 120/70   Ht 5\' 7"  (1.702 m)   Wt 152 lb (68.9 kg)   BMI 23.81 kg/m  Gen: awake, alert, NAD HEENT: anicteric  CV: RRR, no mrg Pulm: CTA b/l Abd: soft, NT/ND, +BS throughout Ext: no c/c/e Neuro: nonfocal  Labs  reviewed in Care Everywhere, CMP normal on 05/07/2023, seen for TSH, folate, B12 and vitamin D      Assessment & Plan:  89 year old male with PMH of GERD, small HH, Schatzki's ring with dilation in 2016, hx of colonic polyps, SIBO, duodenal diverticulum, resolved pancreatitic cyst who is here for follow-up.  GERD with history of Schatzki's ring and small hiatal hernia --some intermittent nocturnal or early morning breakthrough dyspeptic symptoms.  Managed well with change in position or famotidine or Tums.  No alarm symptoms --Continue pantoprazole 40 mg daily --Can use famotidine in the evening or at night 20 mg as needed --Okay for periodic calcium carbonate/Tums  2.  Mild chronic constipation --well-controlled on MiraLAX.  He is also taking a low-dose magnesium supplement for restless leg. --Continue MiraLAX 17 g daily  3.  History of colonic polyps --last colonoscopy 2022, surveillance deferred based on age  Annual follow-up  30 minutes total spent today including patient facing time, coordination of care, reviewing medical history/procedures/pertinent radiology studies, and documentation of the encounter.

## 2023-06-12 ENCOUNTER — Encounter: Payer: Self-pay | Admitting: Internal Medicine

## 2023-09-13 ENCOUNTER — Other Ambulatory Visit: Payer: Self-pay | Admitting: Cardiovascular Disease

## 2023-09-16 ENCOUNTER — Telehealth: Payer: Self-pay | Admitting: Cardiovascular Disease

## 2023-09-16 NOTE — Telephone Encounter (Signed)
RX already taken care of.

## 2023-09-16 NOTE — Telephone Encounter (Signed)
*  STAT* If patient is at the pharmacy, call can be transferred to refill team.   1. Which medications need to be refilled? (please list name of each medication and dose if known)   losartan  (COZAAR ) 25 MG tablet   2. Would you like to learn more about the convenience, safety, & potential cost savings by using the [facility]?   3. Are you open to using the [facility] (Type [facility]. ).  4. Which pharmacy/location (including street and city if local pharmacy) is medication to be sent to?  [id number] - [place], [place] - [address]   5. Do they need a 30 day or 90 day supply?   90 day  Patient stated he only has a few days left of this medication.

## 2023-10-09 ENCOUNTER — Encounter: Payer: Self-pay | Admitting: Cardiovascular Disease

## 2023-11-24 ENCOUNTER — Telehealth: Payer: Self-pay | Admitting: Internal Medicine

## 2023-11-24 NOTE — Telephone Encounter (Signed)
 Inbound call from patient stating he has been having a sick feeling in his stomach when he turns or moves. States he has been awake for about an hour and a half and is feeling sick and has been belching. Offered patient 7/29 but patient stated he does not wish to wait that long and requesting a call to discuss sooner options. Please advise, thank you.

## 2023-11-24 NOTE — Telephone Encounter (Signed)
 Patient calls with complaints of several months right sided abdominal discomfort. States that discomfort seems positional and is worse with lying down up until today. At this time, he has started becoming nauseated with the pain. No vomiting. No chest pain. Denies any increased gas/bloating. Patient has been taking pantoprazole  40 mg every day but states that he only takes the famotidine  as needed.   Patient would like to again discuss his symptoms as he feels they are becoming more problematic since he talked to Dr Albertus about them. We discussed that right sided abdominal pain that improves with positional change may not be something we can help with GI wise, but will be glad to bring him in for additional evaluation. Patient scheduled to see Elida Shawl, NP on 12/05/23 at 330 pm. Patient will start taking pantoprazole  40 mg every morning and famotidine  20 mg at bedtime until visit to see if this is helpful.

## 2023-12-05 ENCOUNTER — Encounter: Payer: Self-pay | Admitting: Nurse Practitioner

## 2023-12-05 ENCOUNTER — Ambulatory Visit: Admitting: Nurse Practitioner

## 2023-12-05 VITALS — BP 122/62 | HR 53 | Ht 67.0 in | Wt 153.0 lb

## 2023-12-05 DIAGNOSIS — Z9049 Acquired absence of other specified parts of digestive tract: Secondary | ICD-10-CM

## 2023-12-05 DIAGNOSIS — R1033 Periumbilical pain: Secondary | ICD-10-CM | POA: Diagnosis not present

## 2023-12-05 DIAGNOSIS — R1011 Right upper quadrant pain: Secondary | ICD-10-CM

## 2023-12-05 DIAGNOSIS — K5900 Constipation, unspecified: Secondary | ICD-10-CM

## 2023-12-05 DIAGNOSIS — Z860101 Personal history of adenomatous and serrated colon polyps: Secondary | ICD-10-CM

## 2023-12-05 DIAGNOSIS — R110 Nausea: Secondary | ICD-10-CM | POA: Diagnosis not present

## 2023-12-05 DIAGNOSIS — R1012 Left upper quadrant pain: Secondary | ICD-10-CM

## 2023-12-05 DIAGNOSIS — Z8719 Personal history of other diseases of the digestive system: Secondary | ICD-10-CM

## 2023-12-05 MED ORDER — ONDANSETRON 4 MG PO TBDP: 4.0000 mg | ORAL_TABLET | Freq: Three times a day (TID) | ORAL | 0 refills | Status: DC | PRN

## 2023-12-05 MED ORDER — FAMOTIDINE 20 MG PO TABS: 20.0000 mg | ORAL_TABLET | Freq: Every day | ORAL | 0 refills | Status: DC

## 2023-12-05 NOTE — Patient Instructions (Addendum)
 _______________________________________________________  If your blood pressure at your visit was 140/90 or greater, please contact your primary care physician to follow up on this.  _______________________________________________________  If you are age 88 or older, your body mass index should be between 23-30. Your Body mass index is 23.96 kg/m. If this is out of the aforementioned range listed, please consider follow up with your Primary Care Provider.  If you are age 27 or younger, your body mass index should be between 19-25. Your Body mass index is 23.96 kg/m. If this is out of the aformentioned range listed, please consider follow up with your Primary Care Provider.   ________________________________________________________  The [place] [facility] providers would like to encourage you to use MY[facility] to communicate with providers for non-urgent requests or questions.  Due to long hold times on the telephone, sending your provider a message by [facility] may be a faster and more efficient way to get a response.  Please allow 48 business hours for a response.  Please remember that this is for non-urgent requests.  _______________________________________________________  We have sent the following medications to your pharmacy for you to pick up at your convenience:  START: ondansetron  ODT 4mg  one tablet dissolved on tongues every 8 hours as needed for nausea. START: famotidine  20mg  one tablet at bedtime each night.  CONTINUE: pantoprazole  40mg  one tablet daily to be taken 30 to 60 minutes prior to breakfast meal   You have been scheduled for a CT scan of the abdomen and pelvis at [facility], 1st floor Radiology. You are scheduled on 12-12-23 at 8am. You should arrive at 5:45am in the ED for registration and to drink contrast at 6am and 7am.  Please follow the written instructions below on the day of your exam:   1) Do not eat anything after 4am (4 hours prior to your test)   You may  take any medications as prescribed with a small amount of water , if necessary. If you take any of the following medications: METFORMIN, GLUCOPHAGE, GLUCOVANCE, AVANDAMET, RIOMET, FORTAMET, ACTOPLUS MET, JANUMET, GLUMETZA or METAGLIP, you MAY be asked to HOLD this medication 48 hours AFTER the exam.   The purpose of you drinking the oral contrast is to aid in the visualization of your intestinal tract. The contrast solution may cause some diarrhea. Depending on your individual set of symptoms, you may also receive an intravenous injection of x-ray contrast/dye. Plan on being at [facility] for 45 minutes or longer, depending on the type of exam you are having performed.   If you have any questions regarding your exam or if you need to reschedule, you may call [facility] at [phone] between the hours of 8:00 am and 5:00 pm, Monday-Friday.   Due to recent changes in healthcare laws, you may see the results of your imaging and laboratory studies on MyChart before your provider has had a chance to review them.  We understand that in some cases there may be results that are confusing or concerning to you. Not all laboratory results come back in the same time frame and the provider may be waiting for multiple results in order to interpret others.  Please give us  48 hours in order for your provider to thoroughly review all the results before contacting the office for clarification of your results.   Thank you for entrusting me with your care and choosing [facility].  Elida Shawl, NP

## 2023-12-05 NOTE — Progress Notes (Signed)
 12/05/2023 Corey Fischer [id number] 07-Jun-1931   Chief Complaint: Nausea, abdominal pain  History of Present Illness: 89 year old male with past medical history of hypertension, hyperlipidemia, OSA, GERD, small hiatal hernia, Schatzki's ring with dilatation in 2016, duodenal diverticulum, SIBO, resolved pancreatic cyst and colon polyps. Past cholecystectomy and appendectomy. He is known by Dr. Albertus. He presents today for further evaluation regarding intermittent nausea and right and left-sided abdominal pain x 6 months. He is accompanied by his wife. He has nausea most mornings without vomiting. He took an old prescription of Ondansetron  that was 89 years old with mild symptom relief.  He has intermittent recurrent RUQ and LUQ pain and sometimes has periumbilical pain, no specific food or stress triggers. At nighttime if he has abdominal pain , he turns from one side to the other his abdominal pain sometimes goes away. His abdominal pain is not always associated to the times when he has nausea.  He has a history of GERD and takes Pantoprazole  40 mg every morning with breakfast. He sometimes takes Famotidine  as needed for abdominal pain, he has taken 2-3 doses of Famotidine  over the past month. He had significantly more nausea upon awakening on Monday 7/14 and had mild right sided abdominal pain which was not excruciating. His abdominal pain abated once he got out of bed but his nausea lasted for 1-1/2 to 2 hours.  His newest medication is Losartan  which he started within the past year.  His appetite is good. No weight loss. No fevers or night sweats. His constipation is well-controlled on MiraLAX  daily. No bloody or black stools. On ASA 81 mg daily.  His most recent EGD was 03/2015 showed a Schatzki's ring which was dilated, a small hiatal hernia otherwise was normal. His most recent colonoscopy 10/2020 identified 3 tubular adenomatous polyps and 1 hyperplastic polyp removed from the colon and  diverticulosis to the transverse and at the hepatic flexure. No further colon polyp surveillance colonoscopies recommended due to age.  Labs 12/03/2023: WBC 4.5.  Hemoglobin 14.7.  Hematocrit 43.6.  Platelets 124.  Sodium 139.  Potassium 5.1.  BUN 21.  Creatinine 1.29.  GFR 53.  Albumin 4.2.  Total bili 0.8.  Alk phos 71.  AST 19.  ALT 17.  Calcium  9.1.  TSH 1.769.  Vitamin B12 level 416.     Latest Ref Rng & Units 02/04/2021    9:03 PM 01/16/2021    6:19 AM 03/10/2018    8:58 AM  CBC  WBC 4.0 - 10.5 K/uL 11.4   5.3   Hemoglobin 13.0 - 17.0 g/dL 86.4  85.3  84.3   Hematocrit 39.0 - 52.0 % 40.8  43.0  45.5   Platelets 150 - 400 K/uL 188   144.0         Latest Ref Rng & Units 10/03/2022    2:52 PM 02/04/2021    9:03 PM 01/16/2021    6:19 AM  CMP  Glucose 70 - 99 mg/dL 90  888  890   BUN 8 - 27 mg/dL 18  17  14    Creatinine 0.76 - 1.27 mg/dL 8.92  8.94  9.09   Sodium 134 - 144 mmol/L 141  137  140   Potassium 3.5 - 5.2 mmol/L 4.9  3.9  4.0   Chloride 96 - 106 mmol/L 105  105  103   CO2 20 - 29 mmol/L 24  25    Calcium  8.6 - 10.2 mg/dL 9.4  8.7  Total Protein 6.5 - 8.1 g/dL  6.9    Total Bilirubin 0.3 - 1.2 mg/dL  0.8    Alkaline Phos 38 - 126 U/L  70    AST 15 - 41 U/L  16    ALT 0 - 44 U/L  15      Colonoscopy 10/26/2020: - One 4 mm polyp in the cecum, removed with a cold snare. Resected and retrieved.  - One 7 mm polyp at the hepatic flexure, removed with a cold snare. Resected and retrieved.  - One 7 mm polyp in the transverse colon, removed with a cold snare. Resected and retrieved.  - One 3 mm polyp in the sigmoid colon, removed with a cold snare. Resected and retrieved.  - Diverticulosis in the transverse colon and at the hepatic flexure.  - Internal hemorrhoids. 1. Surgical [P], colon, cecum, polyp (1) - TUBULAR ADENOMA (X3 FRAGMENTS). - NO HIGH GRADE DYSPLASIA OR MALIGNANCY. 2. Surgical [P], colon, hepatic flexure, transverse, polyp (2) - TUBULAR ADENOMA (X2  FRAGMENTS). - NO HIGH GRADE DYSPLASIA OR MALIGNANCY. 3. Surgical [P], colon, sigmoid, polyp (1) - HYPERPLASTIC POLYP. - NO DYSPLASIA OR MALIGNANCY.  Colonoscopy 03/22/2015: 1. Sessile polyp was found in the descending colon; polypectomy was performed with a cold snare  2. Mild diverticulosis was noted at the hepatic flexure  3. The examination was otherwise normal with normal appearing colonic mucosa 4. Internal hemorrhoids   EGD 03/22/2015: 1. Schatzki ring was found 38 cm from the incisors; balloon dilation to 18 mm 2. The mucosa of the esophagus appeared normal  3. Small hiatal hernia  4. The mucosa of the stomach appeared normal  5. The duodenal mucosa showed no abnormalities in the bulb and 2nd part of the duodenum  Past Medical History:  Diagnosis Date   Anxiety    Aortic atherosclerosis (HCC)    Asymptomatic bilateral carotid artery stenosis    followed by cardiology-- Last duplex in epic 01-09-2021 bilateral  ICA 1-39%,  right  ECA >50%.   B12 deficiency    Benign prostatic hyperplasia with nocturia    urologist-- dr nieves Ross retinal vein occlusion of left eye    followed by dr elner   Chronic constipation    Chronic ITP (idiopathic thrombocytopenia) (HCC)    followed by pcp   Depression    Diverticulosis of colon    Elevated PSA    GERD (gastroesophageal reflux disease)    Hemorrhoids    Hiatal hernia    History of chronic gastritis    Hx of adenomatous colonic polyps    followed by dr albertus   Hyperlipidemia    Hypertension    followed by pcp   OSA on CPAP    followed by dr christella. nathanael (pulm @ [facility] in [place])   Osteoarthritis    PAC (premature atrial contraction)    followed by cardiology, dr berry,  pt symptomatic with palpitations and has PVCs   Raynaud's syndrome    Right nephrolithiasis    per pt told nonobstructive   Schatzki's ring    s/p  balloon dilatation by dr lovenia 11/ 2016   Sinus bradycardia    Symptomatic PVCs    a. Per  report, monitor 05/2012 - NSR with symptomatic PVC. b. Echo 05/2012- EF 55-60%, aortic sclerosis without stenosis. c. Nuc 12/2012 - normal.   Wandering (atrial) pacemaker    Wears hearing aid in both ears    Past Surgical History:  Procedure Laterality  Date   APPENDECTOMY  1946   CARDIAC CATHETERIZATION  1989   Told that he is pristine    CATARACT EXTRACTION W/ INTRAOCULAR LENS IMPLANT Bilateral 2015   CHOLECYSTECTOMY  1989   COLONOSCOPY     last one 10-26-2020 by dr pyrtle   COLONOSCOPY WITH PROPOFOL  N/A 03/22/2015   Procedure: COLONOSCOPY WITH PROPOFOL ;  Surgeon: Gordy CHRISTELLA Starch, MD;  Location: [facility];  Service: Endoscopy;  Laterality: N/A;   ESOPHAGOGASTRODUODENOSCOPY (EGD) WITH PROPOFOL  N/A 03/22/2015   Procedure: ESOPHAGOGASTRODUODENOSCOPY (EGD) WITH PROPOFOL ;  Surgeon: Gordy CHRISTELLA Starch, MD;  Location: [facility];  Service: Endoscopy;  Laterality: N/A;   LAPAROSCOPIC INGUINAL HERNIA REPAIR Bilateral 09/19/2010   LEFT SHOULDER SURG.  2006   PROSTATE BIOPSY N/A 01/16/2021   Procedure: BIOPSY TRANSRECTAL ULTRASONIC PROSTATE (TUBP);  Surgeon: Nieves Cough, MD;  Location: [facility];  Service: Urology;  Laterality: N/A;   ROTATOR CUFF REPAIR Right 1987   TONSILLECTOMY  1951   TOTAL HIP ARTHROPLASTY Left 05/31/2013   Procedure: TOTAL HIP ARTHROPLASTY;  Surgeon: Dempsey JINNY Sensor, MD;  Location: [facility];  Service: Orthopedics;  Laterality: Left;   TRANSURETHRAL RESECTION OF PROSTATE  07/22/2011   Procedure: TRANSURETHRAL RESECTION OF THE PROSTATE WITH GYRUS INSTRUMENTS;  Surgeon: Alm GORMAN Fragmin, MD;  Location: [facility];  Service: Urology;  Laterality: N/A;  Saline Gyrus TURP    TRANSURETHRAL RESECTION OF PROSTATE N/A 01/16/2021   Procedure: TRANSURETHRAL RESECTION OF THE PROSTATE (TURP);  Surgeon: Nieves Cough, MD;  Location: [facility];  Service: Urology;  Laterality: N/A;  REQUESTING 2 HRS FOR THIS CASE   Current Outpatient  Medications on File Prior to Visit  Medication Sig Dispense Refill   amLODipine  (NORVASC ) 2.5 MG tablet TAKE 1 TABLET AT BEDTIME (Patient taking differently: Take 2.5 mg by mouth at bedtime.) 90 tablet 3   Ascorbic Acid (VITAMIN C) 1000 MG tablet Take 1,000 mg by mouth daily.     aspirin  81 MG tablet Take 81 mg by mouth daily.     atorvastatin  (LIPITOR) 20 MG tablet TAKE 1 TABLET BY MOUTH EVERY DAY 90 tablet 3   Cholecalciferol (VITAMIN D3) 2000 units capsule Take 2,000 Units by mouth daily.     Cyanocobalamin  (VITAMIN B-12) 1000 MCG SUBL Place under the tongue daily.     fluticasone  (FLONASE ) 50 MCG/ACT nasal spray Place 1 spray into both nostrils as needed.     losartan  (COZAAR ) 25 MG tablet Take 1 tablet (25 mg total) by mouth daily. 90 tablet 2   Magnesium  200 MG TABS Take 200 mg by mouth at bedtime.     pantoprazole  (PROTONIX ) 40 MG tablet Take 1 tablet (40 mg total) by mouth daily. 90 tablet 3   polyethylene glycol (MIRALAX  / GLYCOLAX ) packet Take 17 g by mouth daily.      Ubiquinol 100 MG CAPS Take 1 capsule by mouth daily.     Zinc 25 MG TABS Take 25 mg by mouth daily.      No current facility-administered medications on file prior to visit.   Current Medications, Allergies, Past Medical History, Past Surgical History, Family History and Social History were reviewed in [employer] record.  Review of Systems:   Constitutional: Negative for fever, sweats, chills or weight loss.  Respiratory: Negative for shortness of breath.   Cardiovascular: Negative for chest pain, palpitations and leg swelling.  Gastrointestinal: See HPI.  Musculoskeletal: Negative for back pain or muscle aches.  Neurological: Negative for  dizziness, headaches or paresthesias.   Physical Exam: Ht 5' 7 (1.702 m)   Wt 153 lb (69.4 kg)   BMI 23.96 kg/m  BP 122/62   Pulse (!) 53   Ht 5' 7 (1.702 m)   Wt 153 lb (69.4 kg)   BMI 23.96 kg/m   General: 89 year old male in no acute  distress. Head: Normocephalic and atraumatic. Eyes: No scleral icterus. Conjunctiva pink . Ears: Normal auditory acuity. Mouth: Dentition intact. No ulcers or lesions.  Lungs: Clear throughout to auscultation. Heart: Regular rate and rhythm, no murmur. Abdomen: Soft, nondistended.  Mild periumbilical tenderness.  Small small umbilical hernia.  Diastasis recti.  No masses or hepatomegaly. Normal bowel sounds x 4 quadrants.  No bruit.  Large RUQ and RLQ scars from prior cholecystectomy and appendectomy surgeries.  Rectal: Deferred. Musculoskeletal: Symmetrical with no gross deformities. Extremities: No edema. Neurological: Alert oriented x 4. No focal deficits.  Psychological: Alert and cooperative. Normal mood and affect  Assessment and Recommendations:  89 year old male with nausea most mornings without vomiting for the past 6 months.  Normal LFTs.  Remote cholecystectomy. - Patient instructed to take Pantoprazole  30 to 60 minutes before breakfast - Add Famotidine  20 mg nightly for possible gastritis component - Consider EGD if nausea persists or worsens - CTAP with oral and IV contrast to assess for intra-abdominal/pelvic pathology to explain nausea and abdominal pains - Ondansetron  ODT 4 mg 1 tab dissolved on tongue every 8 hours as needed - Schedule follow-up appoint with Dr. Albertus in 2 to 3 months  Intermittent RUQ, LUQ  and periumbilical pain for at least the past 6 months, no specific food or stress triggers.  No severe abdominal pain.  No fevers or weight loss.  Positional component, pain sometimes improves after turning from one side to the other at nighttime. Possible adhesions from past cholecystectomy and appendectomy. - CTAP as ordered above to also assess for intra-abdominal/pelvic pathology to explain his variable abdominal pains - Patient to contact office if abdominal pains worsen  Constipation, stable on MiraLAX  - Continue MiraLAX  nightly  History of GERD, small  hiatal hernia and Schatzki's ring - See plan above  History of a duodenal diverticulum  History of colon polyps. His most recent colonoscopy 10/2020 identified 3 tubular adenomatous polyps and 1 hyperplastic polyp removed from the colon and diverticulosis to the transverse and at the hepatic flexure. - No further colon polyp surveillance colonoscopies recommended due to age

## 2023-12-12 ENCOUNTER — Ambulatory Visit (HOSPITAL_COMMUNITY)

## 2023-12-18 ENCOUNTER — Ambulatory Visit (HOSPITAL_COMMUNITY)
Admission: RE | Admit: 2023-12-18 | Discharge: 2023-12-18 | Disposition: A | Discharge status: Home / Self Care | Source: Ambulatory Visit | Attending: Nurse Practitioner | Admitting: Nurse Practitioner

## 2023-12-18 DIAGNOSIS — R1012 Left upper quadrant pain: Secondary | ICD-10-CM | POA: Diagnosis present

## 2023-12-18 DIAGNOSIS — R110 Nausea: Secondary | ICD-10-CM | POA: Diagnosis present

## 2023-12-18 DIAGNOSIS — R1011 Right upper quadrant pain: Secondary | ICD-10-CM | POA: Diagnosis present

## 2023-12-18 DIAGNOSIS — R1033 Periumbilical pain: Secondary | ICD-10-CM | POA: Diagnosis present

## 2023-12-18 MED ORDER — IOHEXOL 9 MG/ML PO SOLN
1000.0000 mL | ORAL | Status: AC
  Administered 2023-12-18: 1000 mL via ORAL

## 2023-12-18 MED ORDER — IOHEXOL 300 MG/ML  SOLN
100.0000 mL | Freq: Once | INTRAMUSCULAR | Status: AC | PRN
  Administered 2023-12-18: 100 mL via INTRAVENOUS

## 2023-12-21 ENCOUNTER — Ambulatory Visit: Payer: Self-pay | Admitting: Nurse Practitioner

## 2023-12-28 ENCOUNTER — Other Ambulatory Visit: Payer: Self-pay | Admitting: Nurse Practitioner

## 2023-12-28 NOTE — Progress Notes (Signed)
 Addendum: Reviewed and agree with assessment and management plan. CT scan I see was reassuring Rene Gonsoulin, Gordy HERO, MD

## 2024-01-09 ENCOUNTER — Ambulatory Visit (HOSPITAL_COMMUNITY)
Admission: RE | Admit: 2024-01-09 | Discharge: 2024-01-09 | Disposition: A | Discharge status: Home / Self Care | Payer: Medicare Other | Source: Ambulatory Visit | Attending: Cardiovascular Disease | Admitting: Cardiovascular Disease

## 2024-01-09 ENCOUNTER — Ambulatory Visit: Payer: Self-pay | Admitting: Cardiovascular Disease

## 2024-01-09 DIAGNOSIS — I6523 Occlusion and stenosis of bilateral carotid arteries: Secondary | ICD-10-CM | POA: Diagnosis present

## 2024-02-19 ENCOUNTER — Telehealth: Payer: Self-pay | Admitting: Internal Medicine

## 2024-02-19 ENCOUNTER — Other Ambulatory Visit: Payer: Self-pay | Admitting: Nurse Practitioner

## 2024-02-19 NOTE — Telephone Encounter (Signed)
 Received a call from patient requesting a refill for Zofran  (4mg ), please review and advise  Thank you

## 2024-02-19 NOTE — Telephone Encounter (Signed)
 Informed patient I refilled the zofran  earlier today and it should be available at the pharmacy. Patient verbalized understanding.

## 2024-02-25 ENCOUNTER — Encounter: Payer: Self-pay | Admitting: Internal Medicine

## 2024-02-25 ENCOUNTER — Ambulatory Visit: Admitting: Internal Medicine

## 2024-02-25 VITALS — BP 120/60 | HR 72 | Ht 66.0 in | Wt 149.4 lb

## 2024-02-25 DIAGNOSIS — K219 Gastro-esophageal reflux disease without esophagitis: Secondary | ICD-10-CM

## 2024-02-25 DIAGNOSIS — K429 Umbilical hernia without obstruction or gangrene: Secondary | ICD-10-CM

## 2024-02-25 DIAGNOSIS — R1033 Periumbilical pain: Secondary | ICD-10-CM

## 2024-02-25 DIAGNOSIS — R110 Nausea: Secondary | ICD-10-CM

## 2024-02-25 DIAGNOSIS — K5909 Other constipation: Secondary | ICD-10-CM

## 2024-02-25 NOTE — Patient Instructions (Signed)
 Start taking your pantoprazole  in the evening and famotidine  in the morning.   Continue Miralax  daily and zofran  as needed.   We will contact you to schedule a January appointment when the schedule becomes available.   _______________________________________________________  If your blood pressure at your visit was 140/90 or greater, please contact your primary care physician to follow up on this.  _______________________________________________________  If you are age 64 or older, your body mass index should be between 23-30. Your Body mass index is 24.11 kg/m. If this is out of the aforementioned range listed, please consider follow up with your Primary Care Provider.  If you are age 66 or younger, your body mass index should be between 19-25. Your Body mass index is 24.11 kg/m. If this is out of the aformentioned range listed, please consider follow up with your Primary Care Provider.   ________________________________________________________  The [facility] providers would like to encourage you to use MYCHART to communicate with providers for non-urgent requests or questions.  Due to long hold times on the telephone, sending your provider a message by [facility] may be a faster and more efficient way to get a response.  Please allow 48 business hours for a response.  Please remember that this is for non-urgent requests.  _______________________________________________________  [facility] is using a team-based approach to care.  Your team is made up of your doctor and two to three APPS. Our APPS (Nurse Practitioners and Physician Assistants) work with your physician to ensure care continuity for you. They are fully qualified to address your health concerns and develop a treatment plan. They communicate directly with your gastroenterologist to care for you. Seeing the Advanced Practice Practitioners on your physician's team can help you by facilitating care more promptly, often  allowing for earlier appointments, access to diagnostic testing, procedures, and other specialty referrals.

## 2024-02-25 NOTE — Progress Notes (Signed)
 Subjective:    Patient ID: Corey Fischer, male    DOB: Jun 29, 1931, 89 y.o.   MRN: [id number]  HPI Marsalis Beaulieu is a 89 year old male with a history of GERD, small hiatal hernia, Schatzki's ring with prior dilation in 2016, known duodenal diverticulum, history of SIBO, history of colonic polyps, resolved pancreatic cyst, chronic constipation who is here for follow-up.  He is here today with his wife.  I last saw him in January 2025 and he saw Elida Nyle Sharps, NP in July 2025.  He experiences nausea or queasiness in the stomach that resolves, leaving a lingering uneasiness, particularly in the mornings. Over the past two to three weeks, he has been taking famotidine  20 mg at bedtime, which has provided some relief, but he continues to feel unsettled. Exercise alleviates these symptoms. He currently takes pantoprazole  40 mg in the morning and famotidine  in the evening.  The right sided abdominal pain that he felt several months ago his completely resolved and not recurred.  He uses Miralax  daily to manage bowel movements, which are sensitive to the medication. Additionally, he takes magnesium  at night to prevent leg cramps, a remedy he learned from his mother. No black or tarry stools or bloody stools, but he reports variable bowel movements.  He reports that pressing on his belly button causes pain, which he first noticed during a previous visit. He has a history of inguinal hernia surgery.  He has lost approximately five pounds over the last several months without being on a special diet, which he is pleased about. His weight loss is consistent with his records from July 18th.  He has been experiencing some memory issues and is taking fish oil and phosphatidylserine, along with wearing an Exelon patch, to address these concerns. He acknowledges mild memory problems.  He celebrated his 89th birthday with his family last month.  They enjoyed 2 or 3 days together and he continues to play pickle  ball on a regular basis which he thoroughly enjoys.   Review of Systems As per HPI, otherwise negative  Current Medications, Allergies, Past Medical History, Past Surgical History, Family History and Social History were reviewed in [employer] record.    Objective:   Physical Exam BP 120/60   Pulse 72   Ht 5' 6 (1.676 m)   Wt 149 lb 6 oz (67.8 kg)   BMI 24.11 kg/m  Gen: awake, alert, NAD Abd: soft, NT/ND, +BS throughout, very small umbilical hernia without tenderness Ext: no c/c/e Neuro: nonfocal  Labs reviewed by care everywhere from July and August 2025: Normal B12, normal TSH, normal liver enzymes, hemoglobin 14.7 MCV 89.9 Normal folate, normal B1 and vitamin E  CT ABDOMEN AND PELVIS WITH CONTRAST   TECHNIQUE: Multidetector CT imaging of the abdomen and pelvis was performed using the standard protocol following bolus administration of intravenous contrast.   RADIATION DOSE REDUCTION: This exam was performed according to the departmental dose-optimization program which includes automated exposure control, adjustment of the mA and/or kV according to patient size and/or use of iterative reconstruction technique.   CONTRAST:  OMNIPAQUE  IOHEXOL  300 MG/ML  SOLN   COMPARISON:  02/05/2021   FINDINGS: Lower Chest: No acute findings.   Hepatobiliary: No suspicious hepatic masses identified. Prior cholecystectomy. No evidence of biliary obstruction.   Pancreas:  No mass or inflammatory changes.   Spleen: Within normal limits in size and appearance.   Adrenals/Urinary Tract: No suspicious masses identified. Punctate calculus again seen in  midpole of right kidney. No evidence of ureteral calculi or hydronephrosis.   Stomach/Bowel: No evidence of obstruction, inflammatory process or abnormal fluid collections.   Vascular/Lymphatic: No pathologically enlarged lymph nodes. No acute vascular findings.   Reproductive:  Moderately enlarged  prostate.   Other:  None.   Musculoskeletal: No suspicious bone lesions identified. Left hip prosthesis again seen.   IMPRESSION: No acute findings.   Tiny right renal calculus. No evidence of ureteral calculi or hydronephrosis.   Moderately enlarged prostate.     Electronically Signed   By: Norleen DELENA Kil M.D.   On: 12/18/2023 18:59     Assessment & Plan:  Hx GERD/Morning gastrointestinal discomfort with nausea Queasy feeling upon waking, improved with exercise. Labs and imaging unremarkable, stable weight, no anemia. - Switch pantoprazole  40 mg to evening dosing, famotidine  20 mg to morning to see if this provides some relief. - Continue ondansetron  4 mg every 8-12 hours as needed for nausea.  Chronic constipation Managed with daily Miralax , sensitive to dosing. Magnesium  used at night for bowel movement and leg cramp prevention. - Continue daily Miralax  17 g daily. - Continue magnesium  at night.  Small umbilical hernia Tender when he is pressed on this at home but not today, reducible, no obstruction or gangrene, not causing significant symptoms. - Advise to avoid unnecessary manipulation. - Instruct to contact if painful or tender without manipulation.  History of colonic polyps Last colonoscopy 2022.  Surveillance deferred based on age.  Follow-up with me in January per patient request  30 minutes total spent today including patient facing time, coordination of care, reviewing medical history/procedures/pertinent radiology studies, and documentation of the encounter.

## 2024-04-24 ENCOUNTER — Encounter (HOSPITAL_COMMUNITY): Payer: Self-pay

## 2024-04-24 ENCOUNTER — Other Ambulatory Visit: Payer: Self-pay

## 2024-04-24 ENCOUNTER — Emergency Department (HOSPITAL_COMMUNITY)
Admission: EM | Admit: 2024-04-24 | Discharge: 2024-04-24 | Disposition: A | Discharge status: Home / Self Care | Attending: Emergency Medicine | Admitting: Emergency Medicine

## 2024-04-24 ENCOUNTER — Emergency Department (HOSPITAL_COMMUNITY)

## 2024-04-24 DIAGNOSIS — Z79899 Other long term (current) drug therapy: Secondary | ICD-10-CM | POA: Insufficient documentation

## 2024-04-24 DIAGNOSIS — Z7982 Long term (current) use of aspirin: Secondary | ICD-10-CM | POA: Insufficient documentation

## 2024-04-24 DIAGNOSIS — R310 Gross hematuria: Secondary | ICD-10-CM | POA: Insufficient documentation

## 2024-04-24 DIAGNOSIS — R1031 Right lower quadrant pain: Secondary | ICD-10-CM | POA: Insufficient documentation

## 2024-04-24 DIAGNOSIS — I2510 Atherosclerotic heart disease of native coronary artery without angina pectoris: Secondary | ICD-10-CM | POA: Insufficient documentation

## 2024-04-24 LAB — COMPREHENSIVE METABOLIC PANEL WITH GFR
ALT: 18 U/L (ref 0–44)
AST: 22 U/L (ref 15–41)
Albumin: 4.5 g/dL (ref 3.5–5.0)
Alkaline Phosphatase: 79 U/L (ref 38–126)
Anion gap: 8 (ref 5–15)
BUN: 19 mg/dL (ref 8–23)
CO2: 28 mmol/L (ref 22–32)
Calcium: 9.8 mg/dL (ref 8.9–10.3)
Chloride: 104 mmol/L (ref 98–111)
Creatinine, Ser: 1.19 mg/dL (ref 0.61–1.24)
GFR, Estimated: 58 mL/min — ABNORMAL LOW (ref >60)
Glucose, Bld: 103 mg/dL — ABNORMAL HIGH (ref 70–99)
Potassium: 3.9 mmol/L (ref 3.5–5.1)
Sodium: 140 mmol/L (ref 135–145)
Total Bilirubin: 0.5 mg/dL (ref 0.0–1.2)
Total Protein: 7.3 g/dL (ref 6.5–8.1)

## 2024-04-24 LAB — CBC WITH DIFFERENTIAL/PLATELET
Abs Immature Granulocytes: 0.01 K/uL (ref 0.00–0.07)
Basophils Absolute: 0.1 K/uL (ref 0.0–0.1)
Basophils Relative: 1 %
Eosinophils Absolute: 0.2 K/uL (ref 0.0–0.5)
Eosinophils Relative: 3 %
HCT: 43.6 % (ref 39.0–52.0)
Hemoglobin: 14.5 g/dL (ref 13.0–17.0)
Immature Granulocytes: 0 %
Lymphocytes Relative: 20 %
Lymphs Abs: 1.2 K/uL (ref 0.7–4.0)
MCH: 30.3 pg (ref 26.0–34.0)
MCHC: 33.3 g/dL (ref 30.0–36.0)
MCV: 91.2 fL (ref 80.0–100.0)
Monocytes Absolute: 0.6 K/uL (ref 0.1–1.0)
Monocytes Relative: 10 %
Neutro Abs: 4 K/uL (ref 1.7–7.7)
Neutrophils Relative %: 66 %
Platelets: 159 K/uL (ref 150–400)
RBC: 4.78 MIL/uL (ref 4.22–5.81)
RDW: 12.9 % (ref 11.5–15.5)
WBC: 6 K/uL (ref 4.0–10.5)
nRBC: 0 % (ref 0.0–0.2)

## 2024-04-24 LAB — URINALYSIS, ROUTINE W REFLEX MICROSCOPIC
Bacteria, UA: NONE SEEN
Bilirubin Urine: NEGATIVE
Glucose, UA: 50 mg/dL — AB
Ketones, ur: NEGATIVE mg/dL
Leukocytes,Ua: NEGATIVE
Nitrite: NEGATIVE
Protein, ur: 100 mg/dL — AB
RBC / HPF: >50 RBC/hpf (ref 0–5)
Specific Gravity, Urine: 1.025 (ref 1.005–1.030)
WBC, UA: >50 WBC/hpf (ref 0–5)
pH: 5 (ref 5.0–8.0)

## 2024-04-24 NOTE — ED Provider Notes (Signed)
  EMERGENCY DEPARTMENT AT [facility] Provider Note   CSN: [id number] Arrival date & time: 04/24/24  1438     Patient presents with: Hematuria and Groin Pain   Corey Fischer is a 89 y.o. male.   The history is provided by the patient and medical records. No language interpreter was used.  Hematuria  Groin Pain     89 year old male with history of thrombocytopenia, CAD, esophageal stricture, GERD, elevated PSA, presenting with complaints of blood in urine.  Patient states yesterday when he went to urinate he noticed quite a bit of blood through his urine.  Subsequent urine shows several small specks of blood but nothing significant however today, he is passing large amount of clots while urinating.  He does not endorse any significant burning sensation with urination frequency or urgency.  He denies any fever chills lightheadedness or dizziness or significant abdominal pain or back pain.  He does have recurrent pain to his right groin region ongoing for 8 months but he has not noticed any swelling or overlying skin changes in that area.  Report previous hernia surgery in his inguinal region on the left side.  He did recall 1 similar hematuria episode in the past that only happened once and he discussed this with his urologist.  No specific treatment were recalled.  He denies any significant fever, night sweats, but did report 5 pound weight loss within the past month.  No history of active cancer and no history of kidney stone.  He denies any recent strenuous activities but does play pickle ball on a regular basis.  Prior to Admission medications   Medication Sig Start Date End Date Taking? Authorizing Provider  amLODipine  (NORVASC ) 2.5 MG tablet TAKE 1 TABLET AT BEDTIME Patient taking differently: Take 2.5 mg by mouth at bedtime. 03/10/18   Katrinka Garnette KIDD, MD  amoxicillin (AMOXIL) 500 MG capsule Take 500 mg by mouth 3 (three) times daily.    [provider]   Ascorbic Acid (VITAMIN C) 1000 MG tablet Take 1,000 mg by mouth daily.    [provider]  aspirin  81 MG tablet Take 81 mg by mouth daily.    [provider]  atorvastatin  (LIPITOR) 20 MG tablet TAKE 1 TABLET BY MOUTH EVERY DAY 05/15/23   Court Dorn PARAS, MD  Cholecalciferol (VITAMIN D3) 2000 units capsule Take 2,000 Units by mouth daily.    [provider]  Cyanocobalamin  (VITAMIN B-12) 1000 MCG SUBL Place under the tongue daily.    [provider]  famotidine  (PEPCID ) 20 MG tablet TAKE 1 TABLET BY MOUTH EVERYDAY AT BEDTIME 12/29/23   Kennedy-Smith, Colleen M, NP  fluticasone  (FLONASE ) 50 MCG/ACT nasal spray Place 1 spray into both nostrils as needed. 09/12/20   [provider]  losartan  (COZAAR ) 25 MG tablet Take 1 tablet (25 mg total) by mouth daily. 09/16/23   Court Dorn PARAS, MD  Magnesium  200 MG TABS Take 200 mg by mouth at bedtime.    [provider]  mometasone (ELOCON) 0.1 % cream Apply topically 2 (two) times daily. 10/22/23   [provider]  ondansetron  (ZOFRAN -ODT) 4 MG disintegrating tablet TAKE 1 TABLET BY MOUTH EVERY 8 HOURS AS NEEDED FOR NAUSEA AND VOMITING 02/19/24   Kennedy-Smith, Colleen M, NP  pantoprazole  (PROTONIX ) 40 MG tablet Take 1 tablet (40 mg total) by mouth daily. 06/10/23   Pyrtle, Gordy HERO, MD  polyethylene glycol (MIRALAX  / GLYCOLAX ) packet Take 17 g by mouth daily.  [provider]  rivastigmine (EXELON) 4.6 mg/24hr Place 1 patch onto the skin. 02/18/24   [provider]  Ubiquinol 100 MG CAPS Take 1 capsule by mouth daily. 08/23/14   [provider]  Zinc 25 MG TABS Take 25 mg by mouth daily.     [provider]    Allergies: Patient has no known allergies.    Review of Systems  Genitourinary:  Positive for hematuria.  All other systems reviewed and are negative.   Updated Vital Signs BP (!) 139/53 (BP Location: Right Arm)   Pulse (!) 56   Temp 97.7 F (36.5  C) (Oral)   Resp 17   Ht 5' 6 (1.676 m)   Wt 65.8 kg   SpO2 97%   BMI 23.40 kg/m   Physical Exam Constitutional:      General: He is not in acute distress.    Appearance: He is well-developed.  HENT:     Head: Atraumatic.  Eyes:     Conjunctiva/sclera: Conjunctivae normal.  Cardiovascular:     Rate and Rhythm: Normal rate and regular rhythm.     Pulses: Normal pulses.     Heart sounds: Normal heart sounds.  Pulmonary:     Effort: Pulmonary effort is normal.     Breath sounds: Normal breath sounds.  Abdominal:     Palpations: Abdomen is soft.     Tenderness: There is no abdominal tenderness. There is no right CVA tenderness or left CVA tenderness.  Genitourinary:    Comments: Chaperone present during exam.  No inguinal lymphopathy or inguinal hernia noted.  Normal circumcised penis free of lesion or rash.  Normal scrotum with testicles with normal lie. Musculoskeletal:     Cervical back: Normal range of motion and neck supple.  Skin:    Findings: No rash.  Neurological:     Mental Status: He is alert.     (all labs ordered are listed, but only abnormal results are displayed) Labs Reviewed  URINALYSIS, ROUTINE W REFLEX MICROSCOPIC - Abnormal; Notable for the following components:      Result Value   Color, Urine RED (*)    APPearance CLOUDY (*)    Glucose, UA 50 (*)    Hgb urine dipstick MODERATE (*)    Protein, ur 100 (*)    All other components within normal limits  COMPREHENSIVE METABOLIC PANEL WITH GFR - Abnormal; Notable for the following components:   Glucose, Bld 103 (*)    GFR, Estimated 58 (*)    All other components within normal limits  CBC WITH DIFFERENTIAL/PLATELET    EKG: None  Radiology: CT Renal Stone Study Result Date: 04/24/2024 CLINICAL DATA:  Abdominal/flank pain, stone suspected.  Hematuria. EXAM: CT ABDOMEN AND PELVIS WITHOUT CONTRAST TECHNIQUE: Multidetector CT imaging of the abdomen and pelvis was performed following the standard  protocol without IV contrast. RADIATION DOSE REDUCTION: This exam was performed according to the departmental dose-optimization program which includes automated exposure control, adjustment of the mA and/or kV according to patient size and/or use of iterative reconstruction technique. COMPARISON:  12/18/2023, 10/15/2017. FINDINGS: Lower chest: Coronary artery calcifications are noted. Atelectasis or scarring is noted at the lung bases. A stable 4 mm nodular opacity is noted in the left lower lobe, axial image 43, unchanged from 2019 and likely benign. Hepatobiliary: No focal liver abnormality is seen. Status post cholecystectomy. No biliary dilatation. Pancreas: Unremarkable. No pancreatic ductal dilatation or surrounding inflammatory changes. Spleen: Normal in size without focal abnormality. Adrenals/Urinary  Tract: The adrenal glands are within normal limits. Stable perinephric fat stranding is noted bilaterally. Renal calculi are noted bilaterally. No ureteral calculus or obstructive uropathy is seen. The visualized portion of the urinary bladder is within normal limits. The majority of the bladder is not well seen due to streak hardware artifact. Stomach/Bowel: The stomach is within normal limits. No bowel obstruction, free air, or pneumatosis is seen. There is a moderate amount of retained stool in the colon. The appendix is not seen. Vascular/Lymphatic: Aortic atherosclerosis. No enlarged abdominal or pelvic lymph nodes. Reproductive: Prostate gland is enlarged. Other: No abdominopelvic ascites. A small fat containing umbilical hernia is present. Musculoskeletal: Total hip arthroplasty changes are noted on the left. Degenerative changes are present in the thoracolumbar spine. No acute osseous abnormality. IMPRESSION: 1. Bilateral nephrolithiasis without evidence of obstructive uropathy. 2. Enlarged prostate gland. 3. Moderate amount of retained stool in the colon. 4. Aortic atherosclerosis and coronary artery  calcifications. Electronically Signed   By: Leita Birmingham M.D.   On: 04/24/2024 17:03     Procedures   Medications Ordered in the ED - No data to display                                  Medical Decision Making Amount and/or Complexity of Data Reviewed Labs: ordered. Radiology: ordered.   BP (!) 139/53 (BP Location: Right Arm)   Pulse (!) 56   Temp 97.7 F (36.5 C) (Oral)   Resp 17   Ht 5' 6 (1.676 m)   Wt 65.8 kg   SpO2 97%   BMI 23.40 kg/m   48:59 PM  89 year old male with history of thrombocytopenia, CAD, esophageal stricture, GERD, elevated PSA, presenting with complaints of blood in urine.  Patient states yesterday when he went to urinate he noticed quite a bit of blood through his urine.  Subsequent urine shows several small specks of blood but nothing significant however today, he is passing large amount of clots while urinating.  He does not endorse any significant burning sensation with urination frequency or urgency.  He denies any fever chills lightheadedness or dizziness or significant abdominal pain or back pain.  He does have recurrent pain to his right groin region ongoing for 8 months but he has not noticed any swelling or overlying skin changes in that area.  Report previous hernia surgery in his inguinal region on the left side.  He did recall 1 similar hematuria episode in the past that only happened once and he discussed this with his urologist.  No specific treatment were recalled.  He denies any significant fever, night sweats, but did report 5 pound weight loss within the past month.  No history of active cancer and no history of kidney stone.  He denies any recent strenuous activities but does play pickle ball on a regular basis.  On exam patient is resting company in no acute discomfort.  He does not have any CVA tenderness.  Genital exam unremarkable.  No signs of trauma.  Patient did show me a picture of his toilet bowl filled with gross red blood.  -Labs  ordered, independently viewed and interpreted by me.  Labs remarkable for reassuring labs, UA without UTI but does show moderate hgb -The patient was maintained on a cardiac monitor.  I personally viewed and interpreted the cardiac monitored which showed an underlying rhythm of: sinus brady -Imaging independently viewed and interpreted by  me and I agree with radiologist's interpretation.  Result remarkable for abd/pelvis CT without acute changes -This patient presents to the ED for concern of hematuria, this involves an extensive number of treatment options, and is a complaint that carries with it a high risk of complications and morbidity.  The differential diagnosis includes gross hematuria, kidney stone, hemorrhagic cystitis, malignancy -Co morbidities that complicate the patient evaluation includes thrombocytopenia, BPH -Treatment includes reassurance -Reevaluation of the patient after these medicines showed that the patient improved -PCP office notes or outside notes reviewed -Discussion with specialist urologist DR. Eskridge who recommend outpt f.u -Escalation to admission/observation considered: patients feels much better, is comfortable with discharge, and will follow up with urology -Prescription medication considered, patient comfortable with OTC meds -Social Determinant of Health considered  Bleeding is likely from the friable skin from a previous TURP procedure.  No evidence of acute urinary retention therefore bladder irrigation is not indicated.  Symptoms are not consistent with UTI.     Final diagnoses:  Gross hematuria    ED Discharge Orders     None          Nivia Colon, PA-C 04/24/24 ROSENA Armenta Canning, MD 04/24/24 2018

## 2024-04-24 NOTE — ED Triage Notes (Signed)
 Pt reports:  Blood in urine First noticed yesterday  Happened once Today Worsening blood in urine Saw a clot Burning urination Started today Groin Pain (right) Started about 6-8 months ago Thought was related to yoga  -Pt urologist stated he would come by in the hospital to see patient.

## 2024-04-24 NOTE — Discharge Instructions (Addendum)
 Please call and follow-up with urology next week for further evaluation and management of the condition.  Return to ER if you develop difficulty urinating, fever, or abdominal pain.

## 2024-04-27 ENCOUNTER — Other Ambulatory Visit: Payer: Self-pay | Admitting: Cardiovascular Disease

## 2024-05-22 ENCOUNTER — Other Ambulatory Visit: Payer: Self-pay | Admitting: Cardiovascular Disease

## 2024-05-26 ENCOUNTER — Telehealth: Payer: Self-pay | Admitting: Cardiovascular Disease

## 2024-05-26 NOTE — Telephone Encounter (Signed)
" °*  STAT* If patient is at the pharmacy, call can be transferred to refill team.   1. Which medications need to be refilled? (please list name of each medication and dose if known)   atorvastatin  (LIPITOR) 20 MG tablet    2. Would you like to learn more about the convenience, safety, & potential cost savings by using the [facility]? no   3. Are you open to using the [facility] (Type [facility]. no   4. Which pharmacy/location (including street and city if local pharmacy) is medication to be sent to?  [id number] - [place], [place] - [address]  5. Do they need a 30 day or 90 day supply? 90 day  "

## 2024-05-27 MED ORDER — ATORVASTATIN CALCIUM 20 MG PO TABS: 20.0000 mg | ORAL_TABLET | Freq: Every day | ORAL | 0 refills | Status: AC

## 2024-05-27 NOTE — Telephone Encounter (Signed)
 Pt's medication was sent to pt's pharmacy as requested. Confirmation received.

## 2024-05-28 ENCOUNTER — Other Ambulatory Visit: Payer: Self-pay | Admitting: Cardiovascular Disease

## 2024-06-02 ENCOUNTER — Ambulatory Visit: Admitting: Internal Medicine

## 2024-06-18 ENCOUNTER — Other Ambulatory Visit: Payer: Self-pay | Admitting: Nurse Practitioner

## 2024-07-15 ENCOUNTER — Ambulatory Visit: Admitting: Nurse Practitioner

## 2024-07-26 ENCOUNTER — Ambulatory Visit: Admitting: Cardiovascular Disease

## 2024-09-03 ENCOUNTER — Ambulatory Visit: Admitting: Internal Medicine
